# Patient Record
Sex: Male | Born: 1975 | Race: Black or African American | Hispanic: No | Marital: Single | Smoking: Former smoker
Health system: Southern US, Community
[De-identification: ages and names within clinical notes are randomized; demographics above are authoritative.]

## PROBLEM LIST (undated history)

## (undated) ENCOUNTER — Emergency Department (HOSPITAL_COMMUNITY): Admission: EM | Payer: Medicaid Other | Source: Home / Self Care

## (undated) DIAGNOSIS — I499 Cardiac arrhythmia, unspecified: Secondary | ICD-10-CM

## (undated) DIAGNOSIS — I509 Heart failure, unspecified: Secondary | ICD-10-CM

## (undated) DIAGNOSIS — I4891 Unspecified atrial fibrillation: Secondary | ICD-10-CM

## (undated) DIAGNOSIS — R011 Cardiac murmur, unspecified: Secondary | ICD-10-CM

## (undated) DIAGNOSIS — F17200 Nicotine dependence, unspecified, uncomplicated: Secondary | ICD-10-CM

## (undated) DIAGNOSIS — Z87891 Personal history of nicotine dependence: Secondary | ICD-10-CM

## (undated) DIAGNOSIS — I10 Essential (primary) hypertension: Secondary | ICD-10-CM

## (undated) DIAGNOSIS — R58 Hemorrhage, not elsewhere classified: Secondary | ICD-10-CM

## (undated) HISTORY — DX: Essential (primary) hypertension: I10

## (undated) HISTORY — PX: CARDIAC SURGERY: SHX584

## (undated) HISTORY — PX: HERNIA REPAIR: SHX51

## (undated) HISTORY — DX: Heart failure, unspecified: I50.9

## (undated) HISTORY — PX: OTHER SURGICAL HISTORY: SHX169

## (undated) HISTORY — PX: ABDOMINAL SURGERY: SHX537

---

## 2004-04-05 ENCOUNTER — Emergency Department (HOSPITAL_COMMUNITY): Admission: EM | Admit: 2004-04-05 | Discharge: 2004-04-05 | Payer: Self-pay | Admitting: Family Medicine

## 2004-09-12 ENCOUNTER — Emergency Department (HOSPITAL_COMMUNITY): Admission: EM | Admit: 2004-09-12 | Discharge: 2004-09-12 | Payer: Self-pay | Admitting: Emergency Medicine

## 2004-09-13 ENCOUNTER — Inpatient Hospital Stay (HOSPITAL_COMMUNITY): Admission: EM | Admit: 2004-09-13 | Discharge: 2004-09-15 | Payer: Self-pay | Admitting: Emergency Medicine

## 2004-09-13 ENCOUNTER — Ambulatory Visit: Payer: Self-pay | Admitting: Gastroenterology

## 2004-09-28 ENCOUNTER — Emergency Department (HOSPITAL_COMMUNITY): Admission: EM | Admit: 2004-09-28 | Discharge: 2004-09-28 | Payer: Self-pay | Admitting: Family Medicine

## 2004-12-31 ENCOUNTER — Emergency Department (HOSPITAL_COMMUNITY): Admission: EM | Admit: 2004-12-31 | Discharge: 2004-12-31 | Payer: Self-pay | Admitting: Emergency Medicine

## 2007-12-04 ENCOUNTER — Emergency Department (HOSPITAL_COMMUNITY): Admission: EM | Admit: 2007-12-04 | Discharge: 2007-12-05 | Payer: Self-pay | Admitting: Emergency Medicine

## 2008-01-05 ENCOUNTER — Emergency Department (HOSPITAL_COMMUNITY): Admission: EM | Admit: 2008-01-05 | Discharge: 2008-01-05 | Payer: Self-pay | Admitting: Emergency Medicine

## 2008-08-22 ENCOUNTER — Emergency Department (HOSPITAL_COMMUNITY): Admission: EM | Admit: 2008-08-22 | Discharge: 2008-08-22 | Payer: Self-pay | Admitting: Family Medicine

## 2009-06-20 ENCOUNTER — Emergency Department (HOSPITAL_COMMUNITY): Admission: EM | Admit: 2009-06-20 | Discharge: 2009-06-20 | Payer: Self-pay | Admitting: Emergency Medicine

## 2009-07-28 ENCOUNTER — Ambulatory Visit (HOSPITAL_BASED_OUTPATIENT_CLINIC_OR_DEPARTMENT_OTHER): Admission: RE | Admit: 2009-07-28 | Discharge: 2009-07-28 | Payer: Self-pay | Admitting: General Surgery

## 2009-08-09 ENCOUNTER — Inpatient Hospital Stay (HOSPITAL_COMMUNITY): Admission: EM | Admit: 2009-08-09 | Discharge: 2009-08-12 | Payer: Self-pay | Admitting: Emergency Medicine

## 2009-08-09 ENCOUNTER — Ambulatory Visit: Payer: Self-pay | Admitting: Gastroenterology

## 2009-08-09 ENCOUNTER — Ambulatory Visit: Payer: Self-pay | Admitting: Infectious Diseases

## 2009-08-10 ENCOUNTER — Encounter: Payer: Self-pay | Admitting: Internal Medicine

## 2009-08-10 ENCOUNTER — Encounter: Payer: Self-pay | Admitting: Infectious Diseases

## 2009-08-11 ENCOUNTER — Encounter: Payer: Self-pay | Admitting: Internal Medicine

## 2009-08-12 ENCOUNTER — Encounter (INDEPENDENT_AMBULATORY_CARE_PROVIDER_SITE_OTHER): Payer: Self-pay | Admitting: *Deleted

## 2009-08-13 ENCOUNTER — Telehealth: Payer: Self-pay | Admitting: Gastroenterology

## 2009-08-21 ENCOUNTER — Encounter (INDEPENDENT_AMBULATORY_CARE_PROVIDER_SITE_OTHER): Payer: Self-pay | Admitting: *Deleted

## 2009-08-25 ENCOUNTER — Ambulatory Visit: Payer: Self-pay | Admitting: Gastroenterology

## 2009-09-02 ENCOUNTER — Telehealth: Payer: Self-pay | Admitting: Gastroenterology

## 2009-09-28 ENCOUNTER — Telehealth: Payer: Self-pay | Admitting: Internal Medicine

## 2009-10-06 ENCOUNTER — Encounter (INDEPENDENT_AMBULATORY_CARE_PROVIDER_SITE_OTHER): Payer: Self-pay | Admitting: *Deleted

## 2009-10-06 ENCOUNTER — Ambulatory Visit: Payer: Self-pay | Admitting: Gastroenterology

## 2009-10-07 ENCOUNTER — Telehealth: Payer: Self-pay | Admitting: Gastroenterology

## 2010-01-27 ENCOUNTER — Emergency Department (HOSPITAL_COMMUNITY): Admission: EM | Admit: 2010-01-27 | Discharge: 2010-01-27 | Payer: Self-pay | Admitting: Emergency Medicine

## 2010-08-11 NOTE — Progress Notes (Signed)
Summary: medicaid will not pay for meds  Phone Note Call from Patient Call back at Home Phone [phone]   Call For: Dr Russella Dar Reason for Call: Talk to Nurse Summary of Call: His medicaid refused to pay for the medication Dr ordered. Wha else can he take? Initial call taken by: Leanor Kail [facility],  August 13, 2009 4:03 PM  Follow-up for Phone Call        Pt states the medication that was sent was pantoprazole sodium and that is not covered by his insurance  company. I told pt that he has to call outpatient clinic b/c they are the ones who prescribed the medication for him. According the discharge summary he is to follow up with [place] and them and the appts have been made. Follow-up by: Christie Nottingham CMA (AAMA),  August 14, 2009 8:20 AM

## 2010-08-11 NOTE — Progress Notes (Signed)
Summary: cx fee?  Phone Note Call from Patient   Caller: wife, Glee Arvin Call For: Dr. Russella Dar Reason for Call: Talk to Doctor Summary of Call: pt's wife called to cancel pt's 09/04/2009 COL and rescheduled for 10/06/2009... reason: pt having emergency oral surgery Dr. Russella Dar, do you want to charge pt $100 cx fee? Initial call taken by: Vallarie Mare,  September 02, 2009 10:02 AM  Follow-up for Phone Call        No charge. Follow-up by: Meryl Dare MD Clementeen Graham,  September 02, 2009 10:18 AM  Additional Follow-up for Phone Call Additional follow up Details #1::        Patient NOT BILLED. Additional Follow-up by: Leanor Kail [facility],  September 08, 2009 9:12 AM

## 2010-08-11 NOTE — Procedures (Signed)
Summary: Upper Endoscopy  Patient: Gary [name] Note: All result statuses are Final unless otherwise noted.  Tests: (1) Upper Endoscopy (EGD)   EGD Upper Endoscopy       DONE     [facility]     [address]     [place], [place]  [address]           ENDOSCOPY PROCEDURE REPORT           PATIENT:  [name], [name]  MR#:  [id number]     BIRTHDATE:  09-21-1975, 33 yrs. old  GENDER:  male           ENDOSCOPIST:  Hedwig Morton. Juanda Chance, MD     Referred by:  Consuello Bossier, M.D.           PROCEDURE DATE:  08/10/2009     PROCEDURE:  enteroscopy 30865     ASA CLASS:  Class II     INDICATIONS:  iron deficiency anemia Hgb 7.5, took Ibuprofen x6     after inguinal hernia repair,     remote EGD 2006 for UGIB was normal     hx of "stomach" surgery           MEDICATIONS:   Versed 7 mg, Fentanyl 75 mcg     TOPICAL ANESTHETIC:  Exactacain Spray           DESCRIPTION OF PROCEDURE:   After the risks benefits and     alternatives of the procedure were thoroughly explained, informed     consent was obtained.  The EC-[id number] ([id number]) endoscope was     introduced through the mouth and advanced to the proximal jejunum,     without limitations.  The instrument was slowly withdrawn as the     mucosa was fully examined.     <<PROCEDUREIMAGES>>           Post-operative change was noted in the antrum. ss/p     gastroenetostomy, both limbs entered x 30 cm and appear normal, no     stricture or sign of bleeding With standard forceps, a biopsy was     obtained and sent to pathology (see image002, image003, image009,     image010, and image011). s/p jejunal biopsy  The stomach was     entered and closely examined. The antrum, angularis, and lesser     curvature were well visualized, including a retroflexed view of     the cardia and fundus. The stomach wall was normally distensable.     The scope passed easily through the pylorus into the duodenum (see     image012, image011, image008,  image004, and image001). normal     gastric musoca, no gastritis  The esophagus and gastroesophageal     junction were completely normal in appearance (see image005).     Retroflexed views revealed no abnormalities.    The scope was then     withdrawn from the patient and the procedure completed.           COMPLICATIONS:  None           ENDOSCOPIC IMPRESSION:     1) Post-operative change in the antrum     2) Normal stomach     3) Normal esophagus     s/p remote gastrojejunostomy, nothing to account for melena     I performed a rectal exam again and confirm presence of melena     RECOMMENDATIONS:     1) await biopsy results  small bowl capsule endoscopy     avoid NSAIDs, if SBCE negative then colonoscopy           REPEAT EXAM:  In 0 year(s) for.           ______________________________     Hedwig Morton. Juanda Chance, MD           CC:           n.     eSIGNED:   Hedwig Morton. Asharia Lotter at 08/10/2009 11:34 AM           Page 2 of 3   Yovanny, Coats, [id number]  Note: An exclamation Demarie (!) indicates a result that was not dispersed into the flowsheet. Document Creation Date: 08/10/2009 11:34 AM _______________________________________________________________________  (1) Order result status: Final Collection or observation date-time: 08/10/2009 11:21 Requested date-time:  Receipt date-time:  Reported date-time:  Referring Physician:   Ordering Physician: Lina Sar [phone]) Specimen Source:  Source: Launa Grill Order Number: [id number] Lab site:

## 2010-08-11 NOTE — Letter (Signed)
Summary: New Patient letter  [facility]  [address] [place], [place] [address]   Phone: [phone]  Fax: [phone]       08/12/2009 MRN: [id number]  [facility] [address] [place], [place]  [address]  Dear Mr. Trudee Grip,  Welcome to the Gastroenterology Division at [facility].    You are scheduled to see Dr.  Russella Dar on 09-04-09 at 3:30p.m. on the 3rd floor at [facility], [address]  We ask that you try to arrive at our office 15 minutes prior to your appointment time to allow for check-in.  We would like you to complete the enclosed self-administered evaluation form prior to your visit and bring it with you on the day of your appointment.  We will review it with you.  Also, please bring a complete list of all your medications or, if you prefer, bring the medication bottles and we will list them.  Please bring your insurance card so that we may make a copy of it.  If your insurance requires a referral to see a specialist, please bring your referral form from your primary care physician.  Co-payments are due at the time of your visit and may be paid by cash, check or credit card.     Your office visit will consist of a consult with your physician (includes a physical exam), any laboratory testing he/she may order, scheduling of any necessary diagnostic testing (e.g. x-ray, ultrasound, CT-scan), and scheduling of a procedure (e.g. Endoscopy, Colonoscopy) if required.  Please allow enough time on your schedule to allow for any/all of these possibilities.    If you cannot keep your appointment, please call [phone] to cancel or reschedule prior to your appointment date.  This allows [place] the opportunity to schedule an appointment for another patient in need of care.  If you do not cancel or reschedule by 5 p.m. the business day prior to your appointment date, you will be charged a $50.00 late cancellation/no-show fee.    Thank you for choosing   Gastroenterology for your medical needs.  We appreciate the opportunity to care for you.  Please visit [place] at our website  to learn more about our practice.                     Sincerely,                                                             The Gastroenterology Division

## 2010-08-11 NOTE — Miscellaneous (Signed)
Summary: hosptial discharge  Hospital Discharge  Date of admission:08/09/09  Date of discharge:08/11/09  Brief reason for admission/active problems: 1.GI bleed. Patient presented with melena, FOBT positive, unintentional weight loss of 7 lbs x 2 weeks; and Hbg of 7.6 at the time of admission. EGD and/or colonsocopy performed by Dr. Russella Dar. Please, refer to a dictated report.  Followup needed:repeat FOBT test to reasses for Gi bleed  The medication and problem lists have been updated.  Please see the dictated discharge summary for details.  Medications: Added new medication of PANTOPRAZOLE SODIUM 40 MG TBEC (PANTOPRAZOLE SODIUM) Take 1 tablet by mouth two times a day - Signed Rx of PANTOPRAZOLE SODIUM 40 MG TBEC (PANTOPRAZOLE SODIUM) Take 1 tablet by mouth two times a day;  #60 x 2;  Signed;  Entered by: Deatra Robinson MD;  Authorized by: Deatra Robinson MD;  Method used: Print then Give to Patient Rx of PANTOPRAZOLE SODIUM 40 MG TBEC (PANTOPRAZOLE SODIUM) Take 1 tablet by mouth two times a day;  #60 x 6;  Signed;  Entered by: Darnelle Maffucci MD;  Authorized by: Deatra Robinson MD;  Method used: Print then Give to Patient Observations: Added new observation of MEDRECON: current updated (08/10/2009 18:07) Added new observation of INSTRUCTIONS: Please come to an appointment at the outpatient clinic at [facility] on 2/10 at 2:00 pm with Dr.Magick  for a followup visit.  Please take your medication as prescribed below. (08/10/2009 18:07)    Prescriptions: PANTOPRAZOLE SODIUM 40 MG TBEC (PANTOPRAZOLE SODIUM) Take 1 tablet by mouth two times a day  #60 x 6   Entered by:   Darnelle Maffucci MD   Authorized by:   Deatra Robinson MD   Signed by:   Darnelle Maffucci MD on 08/11/2009   Method used:   Print then Give to Patient   RxID:   [id number] PANTOPRAZOLE SODIUM 40 MG TBEC (PANTOPRAZOLE SODIUM) Take 1 tablet by mouth two times a day  #60 x 2   Entered and Authorized by:   Deatra Robinson MD   Signed by:   Deatra Robinson MD on 08/10/2009   Method used:   Print then Give to Patient   RxID:   [id number]    Patient Instructions: 1)  Please come to an appointment at the outpatient clinic at [facility] on 2/10 at 2:00 pm with Dr.Magick  for a followup visit. 2)  Please take your medication as prescribed below.  Current Medications (verified): 1)  Pantoprazole Sodium 40 Mg Tbec (Pantoprazole Sodium) .... Take 1 Tablet By Mouth Two Times A Day

## 2010-08-11 NOTE — Letter (Signed)
Summary: [facility] No Show Letter  [facility]  [address] [place], [place] [address]   Phone: [phone]  Fax: [phone]      October 06, 2009 MRN: [id number]   [facility] [address] [place], [place]  [address]      You were scheduled for an endoscopic procedure with Dr. Russella Dar  on 10-06-09 at the [facility] but you did not keep the appointment.    Your provider recommended this procedure for the benefit of your health.  It is very important that you reschedule it.  Failure to do so may be to the detriment of your health.  Please call us at [phone] and we will be happy to assist you with rescheduling.    If you were referred for this procedure by another physician/provider, we will notify him/her that you did not keep your appointment.   Sincerely,  Greer Ee R.N.   [facility]  Appended Document: [facility] No Show Letter Attempted to call patient 4 times unable to reach patient at various numbers on demographic sheet.

## 2010-08-11 NOTE — Progress Notes (Signed)
Summary: NO SHOW for Colon procedure 10-06-09  Phone Note Outgoing Call   Summary of Call: Dr Russella Dar, Patient NO SHOWED for Colon yesterday 10-06-09. Do you want to charge the CX Fee? Initial call taken by: Leanor Kail [facility],  October 07, 2009 3:15 PM  Follow-up for Phone Call        Yes Follow-up by: Meryl Dare MD FACG,  October 07, 2009 3:22 PM  Additional Follow-up for Phone Call Additional follow up Details #1::        Patient BILLED. Additional Follow-up by: Leanor Kail [facility],  October 07, 2009 3:30 PM

## 2010-08-11 NOTE — Letter (Signed)
Summary: [facility] Instructions  [facility]  [address] [place], [place] [address]   Phone: [phone]  Fax: [phone]       Gary [name]    [birth year]    MRN: [id number]        Procedure Day /Date:  Friday 09/04/2009     Arrival Time: 8:00 am      Procedure Time: 9:00 am     Location of Procedure:                    _x _  [facility] (4th Floor)                        PREPARATION FOR COLONOSCOPY WITH MOVIPREP   Starting 5 days prior to your procedure Sunday 08/30/2009  do not eat nuts, seeds, popcorn, corn, beans, peas,  salads, or any raw vegetables.  Do not take any fiber supplements (e.g. Metamucil, Citrucel, and Benefiber).  THE DAY BEFORE YOUR PROCEDURE         DATE: Thursday 2/24  1.  Drink clear liquids the entire day-NO SOLID FOOD  2.  Do not drink anything colored red or purple.  Avoid juices with pulp.  No orange juice.  3.  Drink at least 64 oz. (8 glasses) of fluid/clear liquids during the day to prevent dehydration and help the prep work efficiently.  CLEAR LIQUIDS INCLUDE: Water Jello Ice Popsicles Tea (sugar ok, no milk/cream) Powdered fruit flavored drinks Coffee (sugar ok, no milk/cream) Gatorade Juice: apple, white grape, white cranberry  Lemonade Clear bullion, consomm, broth Carbonated beverages (any kind) Strained chicken noodle soup Hard Candy                             4.  In the morning, mix first dose of MoviPrep solution:    Empty 1 Pouch A and 1 Pouch B into the disposable container    Add lukewarm drinking water to the top line of the container. Mix to dissolve    Refrigerate (mixed solution should be used within 24 hrs)  5.  Begin drinking the prep at 5:00 p.m. The MoviPrep container is divided by 4 marks.   Every 15 minutes drink the solution down to the next [name] (approximately 8 oz) until the full liter is complete.   6.  Follow completed prep with 16 oz of clear liquid of your choice  (Nothing red or purple).  Continue to drink clear liquids until bedtime.  7.  Before going to bed, mix second dose of MoviPrep solution:    Empty 1 Pouch A and 1 Pouch B into the disposable container    Add lukewarm drinking water to the top line of the container. Mix to dissolve    Refrigerate  THE DAY OF YOUR PROCEDURE      DATE: Friday 2/25  Beginning at4:00 am (5 hours before procedure):         1. Every 15 minutes, drink the solution down to the next [name] (approx 8 oz) until the full liter is complete.  2. Follow completed prep with 16 oz. of clear liquid of your choice.    3. You may drink clear liquids until 7:00 am  (2 HOURS BEFORE PROCEDURE).   MEDICATION INSTRUCTIONS  Unless otherwise instructed, you should take regular prescription medications with a small sip of water   as early as possible the morning  of your procedure.        OTHER INSTRUCTIONS  You will need a responsible adult at least 36 years of age to accompany you and drive you home.   This person must remain in the waiting room during your procedure.  Wear loose fitting clothing that is easily removed.  Leave jewelry and other valuables at home.  However, you may wish to bring a book to read or  an iPod/MP3 player to listen to music as you wait for your procedure to start.  Remove all body piercing jewelry and leave at home.  Total time from sign-in until discharge is approximately 2-3 hours.  You should go home directly after your procedure and rest.  You can resume normal activities the  day after your procedure.  The day of your procedure you should not:   Drive   Make legal decisions   Operate machinery   Drink alcohol   Return to work  You will receive specific instructions about eating, activities and medications before you leave.    The above instructions have been reviewed and explained to me by  [name] RN  August 25, 2009 2:32 PM    I fully understand and can  verbalize these instructions _____________________________ Date _________

## 2010-08-11 NOTE — Procedures (Signed)
Summary: Capsule Endoscopy   Capsule Endoscopy  Procedure date:  08/11/2009  Findings:      Performing Location: [facility]   Ordering Physician: Lina Sar, MD  Report created/read by: Claudette Head, MD  Reason for Referral:  35 y/o with recent hospitlization for anemia, melena.  He is s/p remote gastrojejunostomy.  EGD 08/10/09 Negative  Procedure Information and Findings:  1) incomplete study with capsule retention in the esophagus for 4 hours, and very poor prep with food/debri in stomach and only a small portion of the small bowel visulalized.    Summary and Recommendations:  Proceed with colonoscopy next.  If capsule repeated will need better prep and perhaps endoscopic placement into limb of gastrojejunostomy.  This report was created from the original report, which was reviewed and signed by the above listed reading physician.   Appended Document: Capsule Endoscopy reviewed results with patient and he is scheduled for pre-visit for 08-24-09 2:00, colon [facility] 09-04-09 9:00

## 2010-08-11 NOTE — Progress Notes (Signed)
Summary: Prior Approval for Pantoprazole   Phone Note Outgoing Call   Call placed by: Angelina Ok RN,  September 28, 2009 10:42 AM Call placed to: Insurer Summary of Call: Prior Approval for Pantoprazole done.  Approved 09/28/2009 thru 09/28/2010. Angelina Ok RN  September 28, 2009 10:43 AM  Initial call taken by: Angelina Ok RN,  September 28, 2009 10:43 AM    New/Updated Medications: PANTOPRAZOLE SODIUM 40 MG TBEC (PANTOPRAZOLE SODIUM) Take 1 tablet by mouth two times a day

## 2010-08-11 NOTE — Miscellaneous (Signed)
Summary: [facility] PV  Clinical Lists Changes  Medications: Added new medication of MOVIPREP 100 GM  SOLR (PEG-KCL-NACL-NASULF-NA ASC-C) As per prep instructions. - Signed Rx of MOVIPREP 100 GM  SOLR (PEG-KCL-NACL-NASULF-NA ASC-C) As per prep instructions.;  #1 x 0;  Signed;  Entered by: [name] RN;  Authorized by: Meryl Dare MD Clementeen Graham;  Method used: Electronically to [facility] [id number], [place], [place], [place]  [address], Ph: [phone], Fax: [phone] Observations: Added new observation of NKA: T (08/25/2009 13:49)    Prescriptions: MOVIPREP 100 GM  SOLR (PEG-KCL-NACL-NASULF-NA ASC-C) As per prep instructions.  #1 x 0   Entered by:   [name] RN   Authorized by:   Meryl Dare MD [facility]   Signed by:   [name] RN on 08/25/2009   Method used:   Electronically to        [facility] [id number] (retail)       [address] [place], [place]  [address]       Ph: [phone]       Fax: [phone]   RxID:   [id number]

## 2010-09-26 LAB — COMPREHENSIVE METABOLIC PANEL
ALT: 13 U/L (ref 0–53)
AST: 25 U/L (ref 0–37)
Albumin: 3.4 g/dL — ABNORMAL LOW (ref 3.5–5.2)
Alkaline Phosphatase: 64 U/L (ref 39–117)
Calcium: 8.3 mg/dL — ABNORMAL LOW (ref 8.4–10.5)
Chloride: 105 mEq/L (ref 96–112)
Creatinine, Ser: 0.66 mg/dL (ref 0.4–1.5)
GFR calc Af Amer: >60 mL/min (ref >60)
GFR calc non Af Amer: >60 mL/min (ref >60)
Glucose, Bld: 83 mg/dL (ref 70–99)
Potassium: 3.8 mEq/L (ref 3.5–5.1)
Total Bilirubin: 1 mg/dL (ref 0.3–1.2)
Total Protein: 5.5 g/dL — ABNORMAL LOW (ref 6.0–8.3)

## 2010-09-26 LAB — CARDIAC PANEL(CRET KIN+CKTOT+MB+TROPI)
CK, MB: 1.4 ng/mL (ref 0.3–4.0)
Relative Index: 0.7 (ref 0.0–2.5)
Total CK: 309 U/L — ABNORMAL HIGH (ref 7–232)
Total CK: 425 U/L — ABNORMAL HIGH (ref 7–232)
Troponin I: 0.02 ng/mL (ref 0.00–0.06)

## 2010-09-26 LAB — CROSSMATCH
ABO/RH(D): O POS
Antibody Screen: NEGATIVE

## 2010-09-26 LAB — HEPATIC FUNCTION PANEL
ALT: 12 U/L (ref 0–53)
AST: 22 U/L (ref 0–37)
Albumin: 3.1 g/dL — ABNORMAL LOW (ref 3.5–5.2)
Bilirubin, Direct: 0.2 mg/dL (ref 0.0–0.3)
Total Protein: 5.1 g/dL — ABNORMAL LOW (ref 6.0–8.3)

## 2010-09-26 LAB — IRON AND TIBC
Saturation Ratios: 17 % — ABNORMAL LOW (ref 20–55)
TIBC: 185 ug/dL — ABNORMAL LOW (ref 215–435)

## 2010-09-26 LAB — APTT: aPTT: 37 seconds (ref 24–37)

## 2010-09-26 LAB — HEMOGLOBIN AND HEMATOCRIT, BLOOD
HCT: 23.9 % — ABNORMAL LOW (ref 39.0–52.0)
HCT: 25.8 % — ABNORMAL LOW (ref 39.0–52.0)
HCT: 27 % — ABNORMAL LOW (ref 39.0–52.0)
Hemoglobin: 7.9 g/dL — ABNORMAL LOW (ref 13.0–17.0)
Hemoglobin: 9.1 g/dL — ABNORMAL LOW (ref 13.0–17.0)

## 2010-09-26 LAB — RETICULOCYTES: RBC.: 2.37 MIL/uL — ABNORMAL LOW (ref 4.22–5.81)

## 2010-09-26 LAB — FERRITIN: Ferritin: 228 ng/mL (ref 22–322)

## 2010-09-26 LAB — BASIC METABOLIC PANEL
CO2: 24 mEq/L (ref 19–32)
Calcium: 7.9 mg/dL — ABNORMAL LOW (ref 8.4–10.5)
Creatinine, Ser: 0.7 mg/dL (ref 0.4–1.5)
GFR calc Af Amer: >60 mL/min (ref >60)

## 2010-09-26 LAB — CBC
Platelets: 118 10*3/uL — ABNORMAL LOW (ref 150–400)
RBC: 2.3 MIL/uL — ABNORMAL LOW (ref 4.22–5.81)

## 2010-09-26 LAB — DIFFERENTIAL: Basophils Absolute: 0 10*3/uL (ref 0.0–0.1)

## 2010-09-26 LAB — PROTIME-INR: INR: 1.17 (ref 0.00–1.49)

## 2010-09-26 LAB — ABO/RH: ABO/RH(D): O POS

## 2010-09-27 LAB — COMPREHENSIVE METABOLIC PANEL
ALT: 15 U/L (ref 0–53)
AST: 17 U/L (ref 0–37)
Albumin: 4 g/dL (ref 3.5–5.2)
CO2: 29 mEq/L (ref 19–32)
Chloride: 107 mEq/L (ref 96–112)
GFR calc Af Amer: >60 mL/min (ref >60)
GFR calc non Af Amer: >60 mL/min (ref >60)
Potassium: 4 mEq/L (ref 3.5–5.1)
Sodium: 141 mEq/L (ref 135–145)
Total Bilirubin: 1.3 mg/dL — ABNORMAL HIGH (ref 0.3–1.2)

## 2010-09-27 LAB — DIFFERENTIAL
Basophils Absolute: 0 10*3/uL (ref 0.0–0.1)
Eosinophils Absolute: 0.1 10*3/uL (ref 0.0–0.7)
Eosinophils Relative: 2 % (ref 0–5)
Monocytes Absolute: 0.6 10*3/uL (ref 0.1–1.0)

## 2010-09-27 LAB — CBC
MCV: 93.6 fL (ref 78.0–100.0)
RBC: 4.55 MIL/uL (ref 4.22–5.81)
WBC: 6.6 10*3/uL (ref 4.0–10.5)

## 2010-09-29 LAB — BASIC METABOLIC PANEL
BUN: 3 mg/dL — ABNORMAL LOW (ref 6–23)
BUN: 6 mg/dL (ref 6–23)
CO2: 27 mEq/L (ref 19–32)
Calcium: 8.5 mg/dL (ref 8.4–10.5)
Calcium: 8.5 mg/dL (ref 8.4–10.5)
Creatinine, Ser: 0.74 mg/dL (ref 0.4–1.5)
GFR calc Af Amer: >60 mL/min (ref >60)
GFR calc non Af Amer: >60 mL/min (ref >60)
Glucose, Bld: 112 mg/dL — ABNORMAL HIGH (ref 70–99)
Potassium: 4 mEq/L (ref 3.5–5.1)

## 2010-09-29 LAB — CBC
HCT: 25.2 % — ABNORMAL LOW (ref 39.0–52.0)
MCHC: 33.9 g/dL (ref 30.0–36.0)
MCV: 94.9 fL (ref 78.0–100.0)
Platelets: 138 10*3/uL — ABNORMAL LOW (ref 150–400)
Platelets: 139 10*3/uL — ABNORMAL LOW (ref 150–400)
RBC: 2.73 MIL/uL — ABNORMAL LOW (ref 4.22–5.81)
RDW: 14.5 % (ref 11.5–15.5)
WBC: 7.4 10*3/uL (ref 4.0–10.5)
WBC: 9.9 10*3/uL (ref 4.0–10.5)

## 2010-09-29 LAB — HEMOGLOBIN AND HEMATOCRIT, BLOOD
HCT: 26.5 % — ABNORMAL LOW (ref 39.0–52.0)
Hemoglobin: 9.1 g/dL — ABNORMAL LOW (ref 13.0–17.0)

## 2010-11-26 NOTE — Discharge Summary (Signed)
NAMEANTOINNE, Gary [name]               ACCOUNT NO.:  [id number]   MEDICAL RECORD NO.:  [id number]          PATIENT TYPE:  INP   LOCATION:  6736                         FACILITY:  [facility]   PHYSICIAN:  Corinna L. Lendell [name], MDDATE OF BIRTH:  03/02/1976   DATE OF ADMISSION:  09/13/2004  DATE OF DISCHARGE:  09/15/2004                                 DISCHARGE SUMMARY   DIAGNOSES:  1.  Gastrointestinal bleed, most likely upper.  2.  Acute blood loss anemia.  3.  Bronchospasm.  4.  Tenia corporis.  5.  Tooth decay.   FOLLOW-UP:  With primary care physician in about a month.  Follow up with  dentist for extraction   DISCHARGE MEDICATIONS:  1.  Albuterol MDI two puffs q.4h. p.r.n. wheezing or shortness of breath.  2.  Protonix 40 mg p.o. daily for a month.  3.  Ketoconazole cream to the rash on his shoulders twice a day for two      weeks.   ACTIVITY:  Light duty for two weeks and then regular duty.   PAIN MANAGEMENT:  Tylenol as needed. No aspirin, ibuprofen, or Naprosyn.   CONDITION:  Stable.   CONSULTATIONS:  Dr. Russella Dar.   PROCEDURES:  EGD which showed no blood and no source of bleeding.   PERTINENT LABORATORY DATA:  UA revealed negative nitrites, few epithelial  cells, moderate leukocyte esterase, negative protein, 15 ketones, negative  blood. Urine drug screen was positive for cannabinoids. Complete metabolic  panel was fairly unremarkable. INR was 1.2, PTT 40. CBC on admission was  significant for hemoglobin of 8.5, hematocrit 23.9, platelet count was 149,  white blood cell count 11.5. His hemoglobin dropped to 7.5 and after  transfusion rose to almost 10 and remained stable.   SPECIAL STUDIES AND RADIOLOGY:  EKG showed normal sinus rhythm, LVH by  voltage with repolarization abnormality.   HISTORY AND HOSPITAL COURSE:  Mr. Kitner is a pleasant 35 year old black  male who has been having tooth pain and has been taking a lot of ibuprofen.  He presented to the emergency room  twice with hematemesis. Initially he was  evaluated and sent home with a prescription. Please see H&P for further  details. He was admitted, given proton-pump inhibitor, IV fluid,  antiemetics, serial hemoglobins and hematocrits. He required transfusion. He  had also had some melena. He had no further hematemesis or melena during his  hospitalization and EGD was  normal. It is possible that he had a healed Mallory-Weiss tear or an ulcer  in the jejunum that was not completely visualized. His vital signs were  stable on admission and remained so as did his examination. He was  tolerating a regular diet at the time of discharge.      CLS/MEDQ  D:  09/15/2004  T:  09/15/2004  Job:  [id number]

## 2010-11-26 NOTE — H&P (Signed)
NAMEMarland [name]  ELMUS, MATHES NO.:  [id number]   MEDICAL RECORD NO.:  [id number]          PATIENT TYPE:  EMS   LOCATION:  MAJO                         FACILITY:  [facility]   PHYSICIAN:  Deirdre Peer. Polite, M.D. DATE OF BIRTH:  11/21/1975   DATE OF ADMISSION:  09/13/2004  DATE OF DISCHARGE:                                HISTORY & PHYSICAL   CHIEF COMPLAINT:  Black stools and black emesis.   HISTORY OF PRESENT ILLNESS:  Mr. Googe is a 35 year old male who was seen  in the ED x 2 for the above chief complaint.  The patient stated that he was  in fair health up until yesterday where he had coffee ground emesis x 2 and  was having black stools.  Also had associated weakness and malaise.  The  patient does admit to taking NSAIDs approximately three to four times a week  over a two-month period for tooth pain.  The patient recently saw a dentist  and was given 800 mg of Motrin for his pain.  The patient states that after  taking those Motrin tablets, he had significant pain, nausea and subsequent  coffee ground emesis and black stools.  The patient denies any history of  ulcer, however, does admit to having some problems in the distal past with  food poisoning where he stated he required an EGD.  He is unsure if ulcer or  gastritis was diagnosed at that time.  In the ED, the patient was evaluated  and found to be afebrile and hemodynamically stable.  However, he is heme  positive and the hemoglobin at this time is 8.5.  It was approximately 10  yesterday when he presented to the ED.  Admission is deemed necessary for  further evaluation and treatment.  On further questioning, the patient  stated that he has been suffering from an upper respiratory infection for  approximately two weeks with productive sputum and wheezing on the left side  of his chest.  He states that appetite has been okay and up until yesterday,  stools have been okay.  Denies any weight loss.  As stated,  admission is  deemed necessary for evaluation and treatment of upper GI bleed.   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  The patient also stated that he had  abdominal surgery as a child, organs were turned around.  The patient  denies hypertension and denies diabetes.   MEDICATIONS:  Motrin x 2 days, although he has been taking other NSAIDs,  Advil approximately three to four times a week for approximately two months.   SOCIAL HISTORY:  Positive for tobacco one pack per day.  Positive for  alcohol socially, none x 1 week.  Positive marijuana.   PAST SURGICAL HISTORY:  Significant for heart surgery as a child, hole in  heart.  Also abdominal surgery as a child, organs turned around.  The  patient denies appendectomy or cholecystectomy.   ALLERGIES:  No known drug allergies.   FAMILY HISTORY:  Mother with  some skin cancers and diabetes.  Father  unknown.  Brothers and sisters fairly healthy.   REVIEW OF SYSTEMS:  As stated in the HPI, otherwise negative.   PHYSICAL EXAMINATION:  GENERAL APPEARANCE:  The patient is in moderate  distress secondary to abdominal discomfort and feeling weak from his anemia.  HEENT:  Significant for muddy sclerae.  Moist oral mucosa.  NECK:  No nodes.  No JVD.  CHEST:  The patient has rhoncherous breath sounds almost the entire left  hemithorax.  CARDIOVASCULAR:  Regular.  The patient does have a systolic murmur, probable  flow murmur.  ABDOMEN:  Well-healed scar.  No hepatosplenomegaly.  Vague discomfort with  deep palpation.  EXTREMITIES:  No edema.  The patient does have pale nailbeds.  NEUROLOGIC:  Nonfocal exam.   LABORATORY DATA:  BMET:  Sodium 138, potassium 3.8, chloride 105, BUN 20,  creatinine 0.7.  CBC:  White count 11.5, hemoglobin 8.5, MCV 87.2, platelets  149, leukocyte count 77%.  INR 1.2.  Please note that the hemoglobin  yesterday when he presented to the ED  was approximately 10.   ASSESSMENT:  1.  Upper gastrointestinal bleed probably nonsteroidal anti-inflammatory      drug induced.  2.  Symptomatic anemia.  3.  Rule out left-sided pneumonia.  4.  Status post open heart surgery as a child.  5.  Status post abdominal surgery as a child.  6.  Tobacco abuse.  7.  Marijuana use.  8.  Tooth decay with extraction on an outpatient basis.   RECOMMENDATIONS:  Recommend that the patient be admitted to a telemetry  floor bed.  Will obtain coagulation times.  The patient will be resuscitated  with packed red blood cells.  Will start PPI b.i.d.  The patient will be  made NPO.  He is not to receive any NSAIDs.  Will obtain serial H&Hs and a  GI consult.  As for the patient's pulmonary problems, will obtain a chest x-  ray and start empiric antibiotics for possible aspiration pneumonia and  p.r.n. nebulizer treatments.  Will make further recommendations after review  of the above studies.      RDP/MEDQ  D:  09/13/2004  T:  09/13/2004  Job:  [id number]

## 2011-03-22 ENCOUNTER — Emergency Department (HOSPITAL_COMMUNITY)
Admission: EM | Admit: 2011-03-22 | Discharge: 2011-03-22 | Disposition: A | Discharge status: Home / Self Care | Payer: Medicaid Other | Attending: Emergency Medicine | Admitting: Emergency Medicine

## 2011-03-22 DIAGNOSIS — Z202 Contact with and (suspected) exposure to infections with a predominantly sexual mode of transmission: Secondary | ICD-10-CM | POA: Insufficient documentation

## 2011-03-23 LAB — GC/CHLAMYDIA PROBE AMP, GENITAL
Chlamydia, DNA Probe: NEGATIVE
GC Probe Amp, Genital: NEGATIVE

## 2014-02-09 ENCOUNTER — Emergency Department (HOSPITAL_COMMUNITY)
Admission: EM | Admit: 2014-02-09 | Discharge: 2014-02-09 | Disposition: A | Discharge status: Home / Self Care | Payer: Medicaid Other | Attending: Emergency Medicine | Admitting: Emergency Medicine

## 2014-02-09 ENCOUNTER — Encounter (HOSPITAL_COMMUNITY): Payer: Self-pay | Admitting: Emergency Medicine

## 2014-02-09 DIAGNOSIS — F17200 Nicotine dependence, unspecified, uncomplicated: Secondary | ICD-10-CM | POA: Diagnosis not present

## 2014-02-09 DIAGNOSIS — M79609 Pain in unspecified limb: Secondary | ICD-10-CM | POA: Diagnosis not present

## 2014-02-09 DIAGNOSIS — L84 Corns and callosities: Secondary | ICD-10-CM | POA: Diagnosis not present

## 2014-02-09 DIAGNOSIS — M79675 Pain in left toe(s): Secondary | ICD-10-CM

## 2014-02-09 DIAGNOSIS — Z9889 Other specified postprocedural states: Secondary | ICD-10-CM | POA: Insufficient documentation

## 2014-02-09 MED ORDER — HYDROCODONE-ACETAMINOPHEN 5-325 MG PO TABS
1.0000 | ORAL_TABLET | Freq: Once | ORAL | Status: AC
Start: 2014-02-09 — End: 2014-02-09
  Administered 2014-02-09: 1 via ORAL
  Filled 2014-02-09: qty 1

## 2014-02-09 MED ORDER — HYDROCODONE-ACETAMINOPHEN 5-325 MG PO TABS: ORAL_TABLET | ORAL | Status: DC

## 2014-02-09 NOTE — ED Provider Notes (Signed)
CSN: [id number]     Arrival date & time 02/09/14  1023 History  This chart was scribed for a non-physician practitioner, Wynetta EmeryNicole Robina Hamor, working with Doug SouSam Jacubowitz, MD by [place]Jordan Peace, ED Scribe. The patient was seen in TR07C/TR07C. The patient's care was started at 11:31 AM.     Chief Complaint  Patient presents with  . Toe Pain      Patient is a 38 y.o. male presenting with toe pain. The history is provided by the patient. No language interpreter was used.  Toe Pain   HPI Comments: Gary [name] is a 38 y.o. male who presents to the Emergency Department complaining of toe pain onset last night. Pt reports history of open heart surgery and abdominal surgery. Pt states that he has not been putting any medication on it. He further states that his podiatrist told him that it was a callus and was advised to put callus removal medication that has not worked. He denies any fever or chills. He states that he took 2 Advil's before going to sleep last night with slight relief. Pt reports that pain is exacerbated with ambulation and would like to receive a cane to help take pain off of toe when ambulating.    History reviewed. No pertinent past medical history. Past Surgical History  Procedure Laterality Date  . Hernia repair    . Cardiac surgery    . Abdominal surgery     No family history on file. History  Substance Use Topics  . Smoking status: Current Every Day Smoker -- 0.50 packs/day  . Smokeless tobacco: Not on file  . Alcohol Use: Yes     Comment: social    Review of Systems A complete 10 system review of systems was obtained and all systems are negative except as noted in the HPI and PMH.     Allergies  Review of patient's allergies indicates no known allergies.  Home Medications   Prior to Admission medications   Medication Sig Start Date End Date Taking? Authorizing Provider  HYDROcodone-acetaminophen (NORCO/VICODIN) 5-325 MG per tablet Take 1-2 tablets by mouth  every 6 hours as needed for pain. 02/09/14   Mccrae Speciale, PA-C   BP 113/50  Pulse 62  Temp(Src) 98 F (36.7 C) (Oral)  Resp 14  Ht 5\' 5"  (1.651 m)  Wt 125 lb (56.7 kg)  BMI 20.80 kg/m2  SpO2 100% Physical Exam  Nursing note and vitals reviewed. Constitutional: He is oriented to person, place, and time. He appears well-developed and well-nourished. No distress.  HENT:  Head: Normocephalic.  Eyes: Conjunctivae and EOM are normal.  Cardiovascular: Normal rate.   Pulmonary/Chest: Effort normal. No stridor.  Musculoskeletal: Normal range of motion.  Neurological: He is alert and oriented to person, place, and time.  Skin:  Callus tender ulceration to the medial side of left great toe, no warmth, erythema, discharge  Psychiatric: He has a normal mood and affect.    ED Course  Procedures (including critical care time)  Results for orders placed during the hospital encounter of 03/22/11  GC/CHLAMYDIA PROBE AMP, GENITAL      Result Value Ref Range   GC Probe Amp, Genital NEGATIVE  NEGATIVE   Chlamydia, DNA Probe NEGATIVE  NEGATIVE   No results found.    Labs Review Labs Reviewed - No data to display  Imaging Review No results found.   EKG Interpretation None     Medications  HYDROcodone-acetaminophen (NORCO/VICODIN) 5-325 MG per tablet 1 tablet (not administered)  11:40 AM- Treatment plan was discussed with patient who verbalizes understanding and agrees.    MDM   Final diagnoses:  Great toe pain, left    Filed Vitals:   02/09/14 1042  BP: 113/50  Pulse: 62  Temp: 98 F (36.7 C)  TempSrc: Oral  Resp: 14  Height: 5\' 5"  (1.651 m)  Weight: 125 lb (56.7 kg)  SpO2: 100%    Medications  HYDROcodone-acetaminophen (NORCO/VICODIN) 5-325 MG per tablet 1 tablet (not administered)    Gary [name] is a 38 y.o. male presenting with pain to callus of left great toe. No signs of infection. Patient will be given Vicodin and a cane. I advised him to follow  with podiatry.  Evaluation does not show pathology that would require ongoing emergent intervention or inpatient treatment. Pt is hemodynamically stable and mentating appropriately. Discussed findings and plan with patient/guardian, who agrees with care plan. All questions answered. Return precautions discussed and outpatient follow up given.   New Prescriptions   HYDROCODONE-ACETAMINOPHEN (NORCO/VICODIN) 5-325 MG PER TABLET    Take 1-2 tablets by mouth every 6 hours as needed for pain.       I personally performed the services described in this documentation, which was scribed in my presence. The recorded information has been reviewed and is accurate.   Joni Reining Sabine Tenenbaum, PA-C 02/09/14 1140

## 2014-02-09 NOTE — ED Notes (Signed)
Pt reports 8/10 pain to left great toe, pt reports being seen by podiatrist and was told that he had a callus, pt reports it has not gotten any better and pain has increased. Full ROM to toe, pt reports pain increases when walking. nad noted. Axo x4. Pulses present.

## 2014-02-09 NOTE — ED Provider Notes (Signed)
Medical screening examination/treatment/procedure(s) were performed by non-physician practitioner and as supervising physician I was immediately available for consultation/collaboration.   EKG Interpretation None       Doug Sou, MD 02/09/14 1818

## 2014-02-09 NOTE — ED Notes (Signed)
Declined W/C at D/C and was escorted to lobby by RN. 

## 2014-02-09 NOTE — Discharge Instructions (Signed)
Take vicodin for breakthrough pain, do not drink alcohol, drive, care for children or do other critical tasks while taking vicodin.  Do not hesitate to return to the emergency room for any new, worsening or concerning symptoms.  Please obtain primary care using resource guide below. But the minute you were seen in the emergency room and that they will need to obtain records for further outpatient management.   Emergency Department Resource Guide 1) Find a Doctor and Pay Out of Pocket Although you won't have to find out who is covered by your insurance plan, it is a good idea to ask around and get recommendations. You will then need to call the office and see if the doctor you have chosen will accept you as a new patient and what types of options they offer for patients who are self-pay. Some doctors offer discounts or will set up payment plans for their patients who do not have insurance, but you will need to ask so you aren't surprised when you get to your appointment.  2) Contact Your Local Health Department Not all health departments have doctors that can see patients for sick visits, but many do, so it is worth a call to see if yours does. If you don't know where your local health department is, you can check in your phone book. The CDC also has a tool to help you locate your state's health department, and many state websites also have listings of all of their local health departments.  3) Find a [facility] If your illness is not likely to be very severe or complicated, you may want to try a walk in clinic. These are popping up all over the country in pharmacies, drugstores, and shopping centers. They're usually staffed by nurse practitioners or physician assistants that have been trained to treat common illnesses and complaints. They're usually fairly quick and inexpensive. However, if you have serious medical issues or chronic medical problems, these are probably not your best option.  No  Primary Care Doctor: - Call Health Connect at  [phone] - they can help you locate a primary care doctor that  accepts your insurance, provides certain services, etc. - Physician Referral Service- [phone]  Chronic Pain Problems: Organization         Address  Phone   Notes  [facility]  [phone] Patients need to be referred by their primary care doctor.   Medication Assistance: Organization         Address  Phone   Notes  [facility] Medication [facility] [address] [place], [place] [address] [phone] --Must be a resident of [facility] -- Must have NO insurance coverage whatsoever (no Medicaid/ Medicare, etc.) -- The pt. MUST have a primary care doctor that directs their care regularly and follows them in the community   MedAssist  [phone]   [employer]  [phone]    Agencies that provide inexpensive medical care: Organization         Address  Phone   Notes  [place]  [phone]   [facility]    [phone]   [facility] [place],  [address] [phone]   Norris Kibler [phone]   Planned Parenthood    [phone]   [facility]    [phone]   Community Health and [facility]  [address], [place] Phone:  [phone], Fax:  [phone] Hours of Operation:  9 am - 6 pm, M-F.  Also accepts Medicaid/Medicare and self-pay.  [facility] for [place], [address], [place] Phone: [phone], Fax: [phone]. Hours of Operation:  8:30 am - 5:30 pm, M-F.  Also accepts Medicaid and self-pay.  [facility] [place] [address], [place] Phone: [phone]   [place], [place], [place] [phone], Ext. [phone] Mondays & Thursdays: 7-9 AM.  First 15 patients are seen on  a first come, first serve basis.    Pray Providers:  Organization         Address  Phone   Notes  [facility] [address],  [phone] Also accepts self-pay patients.  [facility] [address], [place]  [phone]   [place], [address], [place] [phone]   [facility] [address], [place] [phone]   Lucianne Lei [address], [place]   [phone] Only accepts [place] Access [place] patients after they have their name applied to their card.   Self-Pay (no insurance) in [facility]:  Organization         Address  Phone   Notes  Sickle Cell Patients, [facility] [place] [phone]   [facility] [place] [phone]   [facility] [place]  [place], [place], [place] [phone]   [facility]/Dr. Osei-Bonsu  [address] [place] or [address], [place] [phone] Phone number for both [place] and [place] locations is the same.  Urgent Medical and [facility] [address] [place] [phone]   [facility] [address] [place] or [address] [phone]   [facility] [address] [place] [phone], phone; [phone], fax Sees patients 1st and 3rd Saturday of every month.  Must not qualify for public or private insurance (i.e. Medicaid, Medicare, [place] Health Choice, Veterans' Benefits)  Household income should be no more than 200% of the poverty level The clinic cannot treat you if you are pregnant or think you are pregnant  Sexually transmitted diseases are not treated at the clinic.    Dental Care: Organization          Address  Phone  Notes  [facility] Department of [facility] [place] [phone] Accepts children up to age 4 who are enrolled in [place] or [place]; pregnant women with a Medicaid card; and children who have applied for Medicaid or [place] Health Choice, but were declined, whose parents can pay a reduced fee at time of service.  [facility] Department of [facility]  [address], [place] [phone] Accepts children up to age 69 who are enrolled in [place] or [place]; pregnant women with a Medicaid card; and children who have applied for Medicaid or [place] Health Choice, but were declined, whose parents can pay a reduced fee at time of service.  [facility] Adult Dental Access PROGRAM  [place], [place] [phone]  Patients are seen by appointment only. Walk-ins are not accepted. [place] will see patients 62 years of age and older. Monday - Tuesday (8am-5pm) Most Wednesdays (8:30-5pm) $30 per visit, cash only  [facility] Adult Dental Access PROGRAM  [address], [facility] [phone] Patients are seen by appointment only. Walk-ins are not accepted. [place] will see patients 76 years of age and older. One Wednesday Evening (Monthly: Volunteer Based).  $30 per visit, cash only  [place]  [phone] for adults; Children under age 48, call Graduate Pediatric Dentistry at [phone]. Children aged 49-14, please call [phone] to request a pediatric application.  Dental services are provided in all areas of dental care including fillings, crowns and bridges, complete and partial dentures, implants, gum treatment, root canals, and extractions. Preventive care is also provided. Treatment is provided to both adults and children. Patients are selected via a lottery and there is often a waiting list.   [facility] [address] [place]  [phone] [email or web address]   [facility] [address] [place], [place] [phone], Ext. [phone] Second and Fourth Thursday of each month, opens at 6:30 AM; Clinic ends at 9 AM.  Patients are seen on a first-come first-served basis, and a limited number are seen during each clinic.   [facility]  [address] Hillard Danker [place], [place] [phone]   Eligibility Requirements You must have lived in [place], [place], or [place] counties for at least the last three months.   You cannot be eligible for state or federal sponsored [employer], including [employer], [place], or [employer].   You generally cannot be eligible for healthcare insurance through your employer.    How to apply: Eligibility screenings are held every Tuesday and Wednesday afternoon from 1:00 pm until 4:00 pm. You do not need an appointment for the interview!  [facility] [address] [place], [place]   [place]  [place] Department  [place]  [phone]    Behavioral Health Resources in the Community: Intensive Outpatient Programs Organization         Address  Phone  Notes  [place]. [address] [place], [place] [phone]   [facility] Outpatient [address], [place], [place]   ADS: Alcohol & Drug Svcs [address] [place], [place]   [place] [address],  [place], [place] or [phone]   Substance Abuse Resources Organization         Address  Phone  Notes  Alcohol and Drug Services  [phone]   [place]  [phone]   The [place]   Chinita Pester  [phone]   Residential & Outpatient Substance Abuse Program  [phone]   Psychological  Services Organization         Address  Phone  Notes  [facility] [place]  [place]  [phone]   [place] [address], [place] or [phone]    Mobile Crisis Teams Organization         Address  Phone  Notes  [facility]rapeutic Alternatives, Mobile Crisis Care Unit  [phone]   Assertive Psychotherapeutic Services  [address] [place], [place]   [facility] [address], [place]  [place] [phone]    Self-Help/Support Groups Organization         Address  Phone             Notes  Mental Health Assoc. of [place] - variety of support groups  Lucien Call for more information  Narcotics Anonymous (NA), [facility] [address], [employer] [place]  2 meetings at this location   Special educational needs teacher         Address  Phone  Notes  ASAP Residential Treatment [place],    [place]  [phone]   [facility]  [address] [place] [phone], [place], [place]   [place], [place] [phone] Admissions: 8am-3pm M-F  Incentives Substance [place] at [place] [address]    [place], [place] [phone]   [facility] [address] [place], [place], [place]   [facility] [address],  [place], [place]   [facility]ight Programs - Intensive Outpatient [address], [place], [place]   [facility] ([place].,  [place], [place] [phone] or [phone]   [facility]idential Treatment Services (RTS) [address], [place], [place] Accepts Medicaid  [facility] [place] [address],  [place] [phone] Substance Abuse/Addiction Treatment   [facility] Organization         Address  Phone  Notes  [facility]  [phone]   Domenic Schwab, PhD [address] Arlis [place], [place]   [phone] or [phone]   [place], [place] [phone]   [facility] [phone] [place], [place] [phone] Insurance/Medicaid/sponsorship through [employer] [facility] [facility] and Families [address], [address]                                    [place], [place] [phone] [place] [address], [place] [phone]    Dr. Adele Schilder  [phone]   [facility] Dept. 1) [address] [place] 2) [place] 3)  [place] [address], [place] [phone]  [phone]   [place] [phone] or [phone] (After Hours)

## 2014-04-30 ENCOUNTER — Emergency Department (HOSPITAL_COMMUNITY)
Admission: EM | Admit: 2014-04-30 | Discharge: 2014-04-30 | Disposition: A | Discharge status: Home / Self Care | Payer: Medicaid Other | Attending: Emergency Medicine | Admitting: Emergency Medicine

## 2014-04-30 ENCOUNTER — Encounter (HOSPITAL_COMMUNITY): Payer: Self-pay | Admitting: Emergency Medicine

## 2014-04-30 DIAGNOSIS — Z720 Tobacco use: Secondary | ICD-10-CM | POA: Insufficient documentation

## 2014-04-30 DIAGNOSIS — R591 Generalized enlarged lymph nodes: Secondary | ICD-10-CM | POA: Insufficient documentation

## 2014-04-30 DIAGNOSIS — R590 Localized enlarged lymph nodes: Secondary | ICD-10-CM

## 2014-04-30 DIAGNOSIS — Z9889 Other specified postprocedural states: Secondary | ICD-10-CM | POA: Insufficient documentation

## 2014-04-30 DIAGNOSIS — R109 Unspecified abdominal pain: Secondary | ICD-10-CM | POA: Diagnosis present

## 2014-04-30 MED ORDER — IBUPROFEN 800 MG PO TABS
800.0000 mg | ORAL_TABLET | Freq: Once | ORAL | Status: AC
  Administered 2014-04-30: 800 mg via ORAL
  Filled 2014-04-30: qty 1

## 2014-04-30 MED ORDER — IBUPROFEN 600 MG PO TABS: 600.0000 mg | ORAL_TABLET | Freq: Three times a day (TID) | ORAL | Status: DC | PRN

## 2014-04-30 MED ORDER — OXYCODONE-ACETAMINOPHEN 5-325 MG PO TABS
1.0000 | ORAL_TABLET | Freq: Once | ORAL | Status: AC
  Administered 2014-04-30: 1 via ORAL
  Filled 2014-04-30: qty 1

## 2014-04-30 NOTE — ED Provider Notes (Signed)
CSN: [id number]     Arrival date & time 04/30/14  [id number] History   First MD Initiated Contact with Patient 04/30/14 [phone]     Chief Complaint  Patient presents with  . Abscess      HPI Patient presents to the emergency department complaining of left inguinal discomfort and pain over the past week.  He states no nausea or vomiting.  It's worse with walking.  Denies pain in his left leg.  No scrotal pain or testicular pain.  No penile pain or penile discharge.  No fevers or chills.  No prior history of swelling in this region.  He has had a left inguinal hernia repair in 2011 [facility].  He states the swelling is more distal and inferior to his inguinal hernia.  He denies new scrotal or testicular masses.  Pain is mild in severity and worse with movement of his left leg and palpation.  No redness or rash noted.  No infectious symptoms in his left leg.   History reviewed. No pertinent past medical history. Past Surgical History  Procedure Laterality Date  . Hernia repair    . Cardiac surgery    . Abdominal surgery     History reviewed. No pertinent family history. History  Substance Use Topics  . Smoking status: Current Every Day Smoker -- 0.50 packs/day  . Smokeless tobacco: Not on file  . Alcohol Use: Yes     Comment: social    Review of Systems  All other systems reviewed and are negative.     Allergies  Review of patient's allergies indicates no known allergies.  Home Medications   Prior to Admission medications   Medication Sig Start Date End Date Taking? Authorizing Provider  HYDROcodone-acetaminophen (NORCO/VICODIN) 5-325 MG per tablet Take 1-2 tablets by mouth every 6 hours as needed for pain. 02/09/14   Nicole Pisciotta, PA-C  ibuprofen (ADVIL,MOTRIN) 600 MG tablet Take 1 tablet (600 mg total) by mouth every 8 (eight) hours as needed. 04/30/14   Lyanne CoKevin M Shawndale Kilpatrick, MD   BP 110/60  Pulse 62  Temp(Src) 98.3 F (36.8 C) (Oral)  Resp 20  SpO2  95% Physical Exam  Nursing note and vitals reviewed. Constitutional: He is oriented to person, place, and time. He appears well-developed and well-nourished.  HENT:  Head: Normocephalic and atraumatic.  Eyes: EOM are normal.  Neck: Normal range of motion.  Cardiovascular: Normal rate.   Pulmonary/Chest: Effort normal.  Abdominal: Soft. He exhibits no distension. There is no tenderness.  Genitourinary:  No scrotal changes.  Normal testicles bilaterally.  Uncircumcised penis without tenderness or discharge.  Left inguinal region with small lymphadenopathy palpable below the inguinal ligament on the left.  No palpable mass noted in the left inguinal canal.  No significant tenderness in the left inguinal canal.  The overlying skin changes of the left groin   Musculoskeletal: Normal range of motion.  Neurological: He is alert and oriented to person, place, and time.  Skin: Skin is warm and dry.  Psychiatric: He has a normal mood and affect. Judgment normal.    ED Course  Procedures (including critical care time) Labs Review Labs Reviewed - No data to display  Imaging Review No results found.   EKG Interpretation None      MDM   Final diagnoses:  Inguinal lymphadenopathy    This appears to be isolated left inguinal lymphadenopathy.  I recommended anti-inflammatories and observation as an outpatient.  The patient will monitor this.  Asked that he return to the ER for any new or worsening symptoms.  He will followup with [facility] and schedule an appointment.  If this persists he may benefit from biopsy.  He has no signs of left inguinal hernia at this time.  No infectious symptoms in his left leg.  No GU symptoms.  He understands to return to the ER for new or worsening symptoms.    Lyanne Co, MD 04/30/14 1021

## 2014-04-30 NOTE — ED Notes (Signed)
Per pt sts he has a knot underneath where he had hernia surgery. sts it is very painful x 1 week.

## 2014-04-30 NOTE — Discharge Instructions (Signed)

## 2014-05-16 ENCOUNTER — Emergency Department (HOSPITAL_COMMUNITY): Payer: Medicaid Other

## 2014-05-16 ENCOUNTER — Encounter (HOSPITAL_COMMUNITY): Payer: Self-pay | Admitting: *Deleted

## 2014-05-16 ENCOUNTER — Emergency Department (HOSPITAL_COMMUNITY)
Admission: EM | Admit: 2014-05-16 | Discharge: 2014-05-16 | Disposition: A | Discharge status: Home / Self Care | Payer: Medicaid Other | Attending: Emergency Medicine | Admitting: Emergency Medicine

## 2014-05-16 DIAGNOSIS — Z9889 Other specified postprocedural states: Secondary | ICD-10-CM | POA: Diagnosis not present

## 2014-05-16 DIAGNOSIS — R079 Chest pain, unspecified: Secondary | ICD-10-CM

## 2014-05-16 DIAGNOSIS — Z720 Tobacco use: Secondary | ICD-10-CM | POA: Diagnosis not present

## 2014-05-16 DIAGNOSIS — R0789 Other chest pain: Secondary | ICD-10-CM

## 2014-05-16 DIAGNOSIS — R011 Cardiac murmur, unspecified: Secondary | ICD-10-CM | POA: Diagnosis not present

## 2014-05-16 LAB — BASIC METABOLIC PANEL
Anion gap: 12 (ref 5–15)
BUN: 7 mg/dL (ref 6–23)
CALCIUM: 9.1 mg/dL (ref 8.4–10.5)
CO2: 26 mEq/L (ref 19–32)
CREATININE: 0.71 mg/dL (ref 0.50–1.35)
Chloride: 100 mEq/L (ref 96–112)
GFR calc Af Amer: >90 mL/min (ref >90)
GLUCOSE: 106 mg/dL — AB (ref 70–99)
Potassium: 3.8 mEq/L (ref 3.7–5.3)
Sodium: 138 mEq/L (ref 137–147)

## 2014-05-16 LAB — I-STAT TROPONIN, ED: Troponin i, poc: 0.01 ng/mL (ref 0.00–0.08)

## 2014-05-16 LAB — CBC
HEMATOCRIT: 34.1 % — AB (ref 39.0–52.0)
HEMOGLOBIN: 12.2 g/dL — AB (ref 13.0–17.0)
MCH: 30.9 pg (ref 26.0–34.0)
MCHC: 35.8 g/dL (ref 30.0–36.0)
MCV: 86.3 fL (ref 78.0–100.0)
Platelets: 176 10*3/uL (ref 150–400)
RBC: 3.95 MIL/uL — ABNORMAL LOW (ref 4.22–5.81)
RDW: 12 % (ref 11.5–15.5)
WBC: 8.3 10*3/uL (ref 4.0–10.5)

## 2014-05-16 NOTE — ED Notes (Signed)
Pt denies chest pain or SOB on discharge. Pt ambulatory on discharge.

## 2014-05-16 NOTE — ED Notes (Signed)
Plan of care discussed with PA.

## 2014-05-16 NOTE — ED Provider Notes (Signed)
Medical screening examination/treatment/procedure(s) were conducted as a shared visit with non-physician practitioner(s) and myself.  I personally evaluated the patient during the encounter.   EKG Interpretation   Date/Time:  Friday May 16 2014 11:06:26 EST Ventricular Rate:  68 PR Interval:  220 QRS Duration: 86 QT Interval:  448 QTC Calculation: 476 R Axis:   66 Text Interpretation:  Sinus rhythm Left ventricular hypertrophy  REPOLARIZATION ABNORMALITY No significant change was found Confirmed by  [facility]  MD, TREY ([id number]) on 05/16/2014 11:12:03 AM      38 yo male with hx of congenital heart disease presenting with left sided chest pain.  Present for several days, but better today.  At time of my interview, no pain.  No associated SOB, dizziness, diaphoresis, or nausea.  On exam, well appearing, nontoxic, not distressed, normal respiratory effort, normal perfusion, chest wall nontender, II/VI systolic murmur, lungs CTAB.  Pain does not appear cardiac in nature.  Also low suspicion for PE and PERC negative.  Pt reports that his chest was tender prior to his pain resolving, likely representing MSK chest pain.  His EKG is abnormal, but similar to prior.    Clinical Impression: 1. Chest wall pain   2. Chest pain       Warnell Forester, MD 05/16/14 [phone]

## 2014-05-16 NOTE — Discharge Instructions (Signed)
Chest Wall Pain °Chest wall pain is pain in or around the bones and muscles of your chest. It may take up to 6 weeks to get better. It may take longer if you must stay physically active in your work and activities.  °CAUSES  °Chest wall pain may happen on its own. However, it may be caused by: °· A viral illness like the flu. °· Injury. °· Coughing. °· Exercise. °· Arthritis. °· Fibromyalgia. °· Shingles. °HOME CARE INSTRUCTIONS  °· Avoid overtiring physical activity. Try not to strain or perform activities that cause pain. This includes any activities using your chest or your abdominal and side muscles, especially if heavy weights are used. °· Put ice on the sore area. °¨ Put ice in a plastic bag. °¨ Place a towel between your skin and the bag. °¨ Leave the ice on for 15-20 minutes per hour while awake for the first 2 days. °· Only take over-the-counter or prescription medicines for pain, discomfort, or fever as directed by your caregiver. °SEEK IMMEDIATE MEDICAL CARE IF:  °· Your pain increases, or you are very uncomfortable. °· You have a fever. °· Your chest pain becomes worse. °· You have new, unexplained symptoms. °· You have nausea or vomiting. °· You feel sweaty or lightheaded. °· You have a cough with phlegm (sputum), or you cough up blood. °MAKE SURE YOU:  °· Understand these instructions. °· Will watch your condition. °· Will get help right away if you are not doing well or get worse. °Document Released: 06/27/2005 Document Revised: 09/19/2011 Document Reviewed: 02/21/2011 °ExitCare® Patient Information ©2015 [employer]. This information is not intended to replace advice given to you by your health care provider. Make sure you discuss any questions you have with your health care provider. ° ° °Emergency Department Resource Guide °1) Find a Doctor and Pay Out of Pocket °Although you won't have to find out who is covered by your insurance plan, it is a good idea to ask around and get recommendations. You  will then need to call the office and see if the doctor you have chosen will accept you as a new patient and what types of options they offer for patients who are self-pay. Some doctors offer discounts or will set up payment plans for their patients who do not have insurance, but you will need to ask so you aren't surprised when you get to your appointment. ° °2) Contact Your Local Health Department °Not all health departments have doctors that can see patients for sick visits, but many do, so it is worth a call to see if yours does. If you don't know where your local health department is, you can check in your phone book. The CDC also has a tool to help you locate your state's health department, and many state websites also have listings of all of their local health departments. ° °3) Find a Walk-in Clinic °If your illness is not likely to be very severe or complicated, you may want to try a walk in clinic. These are popping up all over the country in pharmacies, drugstores, and shopping centers. They're usually staffed by nurse practitioners or physician assistants that have been trained to treat common illnesses and complaints. They're usually fairly quick and inexpensive. However, if you have serious medical issues or chronic medical problems, these are probably not your best option. ° °No Primary Care Doctor: °- Call [facility] Connect at  832-8000 - they can help you locate a primary care doctor that    accepts your insurance, provides certain services, etc. °- Physician Referral Service- 1-800-533-3463 ° °Chronic Pain Problems: °Organization         Address  Phone   Notes  °[facility]  (336) 297-2271 Patients need to be referred by their primary care doctor.  ° °Medication Assistance: °Organization         Address  Phone   Notes  °[place] [facility] [address] °[place], [place] [address] (336) 641-8030 --Must be a resident of [place] °-- Must  have NO insurance coverage whatsoever (no Medicaid/ Medicare, etc.) °-- The pt. MUST have a primary care doctor that directs their care regularly and follows them in the community °  °[facility]  (866) 331-1348   °[employer]  (888) 892-1162   ° °Agencies that provide inexpensive medical care: °Organization         Address  Phone   Notes  °[facility]  (336) 832-8035   °[facility]    (336) 832-7272   °[facility] [address] °[place], [place] [address] (336) 832-4777   °[facility] [address], °[place] (336) 271-4999   °[facility]    (336) 373-0678   °[facility]    (336) 272-1050   °[facility] ° [address], [place] Phone:  (336) 832-4444, Fax:  (336) 832-4440 Hours of Operation:  9 am - 6 pm, M-F.  Also accepts Medicaid/Medicare and self-pay.  °[facility] ° [address], [place] Phone: (336) 832-3150, Fax: (336) 832-3151. Hours of Operation:  8:30 am - 5:30 pm, M-F.  Also accepts Medicaid and self-pay.  °[facility] [place] [address], [place] Phone: (336) 878-6027   °[facility] [address], [place], [place] (336)723-1848, Ext. 123 Mondays & Thursdays: 7-9 AM.  First 15 patients are seen on a first come, first serve basis. °  ° °Medicaid-accepting [place] Providers: ° °Organization         Address  Phone   Notes  °[facility] [address], [place] (336) 641-2100 Also accepts self-pay patients.  °[facility] [address], [place] ° (336) 856-9996   °[facility] [address], [place] (336) 288-8857   °[facility] [address] [place] [address], [place] (336) 299-7000   °Veita Bland [address], [place]  ° (336) 373-1557 Only accepts [place] Access Medicaid patients after  they have their name applied to their card.  ° °Self-Pay (no insurance) in [place]: ° °Organization         Address  Phone   Notes  °Sickle Cell Patients, [facility] [address], [place] (336) 832-1970   °[facility] [address], [place] (336) 832-4400   °[facility] ° [address], [place] (336) 992-4800   °[facility]/Dr. Osei-Bonsu ° [address] [place] [address], [place] or [address], [place] (336) 841-8500 Phone number for both [place] and [place] locations is the same.  °[facility] [address],  (336) 299-0000   °[facility]  [address] [place] [address],  or [address] (336) 852-7530 °(336) 878-2260   °[facility] [address], [place] (336) 350-1642, phone; (336) 294-5005, fax Sees patients 1st and 3rd Saturday of every month.  Must not qualify for public or private insurance (i.e. Medicaid, Medicare, [place] Health Choice, Veterans' Benefits) • Household income should be no more than 200% of the poverty level •The clinic cannot treat you if you are pregnant or think you are pregnant • Sexually transmitted diseases are not treated at the clinic.  ° ° °Dental Care: °Organization         Address  Phone  Notes  °[facility] [facility] [address], [place] (336) 641-6152 Accepts children up to age 21 who are enrolled in Medicaid or [place] Health Choice; pregnant women with a Medicaid card; and children who have applied for Medicaid or [place] Health Choice, but were declined, whose parents can pay a reduced fee at time of service.  °[facility] [place]  [address], [place] (336) 641-7733 Accepts children up to age 21 who are enrolled in Medicaid or [place] Health Choice; pregnant women with a Medicaid card; and children who  have applied for Medicaid or [place] Health Choice, but were declined, whose parents can pay a reduced fee at time of service.  °[facility] ° [address], [place] (336) 641-4533 Patients are seen by appointment only. Walk-ins are not accepted. [facility] will see patients 18 years of age and older. °Monday - Tuesday (8am-5pm) °Most Wednesdays (8:30-5pm) °$30 per visit, cash only  °[facility] ° [address], [place] (336) 641-4533 Patients are seen by appointment only. Walk-ins are not accepted. [facility] will see patients 18 years of age and older. °One Wednesday Evening (Monthly: Volunteer Based).  $30 per visit, cash only  °[facility]  (919) 537-3737 for adults; Children under age 4, call Graduate Pediatric Dentistry at (919) [phone]. Children aged 4-14, please call (919) [phone] to request a pediatric application. ° Dental services are provided in all areas of dental care including fillings, crowns and bridges, complete and partial dentures, implants, gum treatment, root canals, and extractions. Preventive care is also provided. Treatment is provided to both adults and children. °Patients are selected via a lottery and there is often a waiting list. °  °[facility] [address], °[place] ° (336) 763-8833 [email or web address] °  °[facility] [address], [place], [place] (336)723-1848, Ext. 123 Second and Fourth Thursday of each month, opens at 6:30 AM; Clinic ends at 9 AM.  Patients are seen on a first-come first-served basis, and a limited number are seen during each clinic.  ° °[facility] ° [address], [place], [place] (336) 723-7904   Eligibility Requirements °You must have lived in [place], [place], or [place] counties for at least the last three months. °  You cannot be eligible for state or federal sponsored healthcare insurance, including Veterans  Administration, Medicaid, or Medicare. °  You generally cannot be eligible for healthcare insurance through your employer.  °  How to apply: °Eligibility screenings are held every Tuesday and Wednesday afternoon from 1:00 pm until 4:00 pm. You do not need an appointment for the interview!  °[facility] [address], [place], [place] 336-631-2330   °[facility] [facility]  336-342-8273   °[place]  336-703-3100   °[facility]    336-570-6415   ° °Behavioral Health Resources in the Community: °Intensive Outpatient Programs °Organization         Address  Phone  Notes  °[facility] [address], [place], [place] 336-878-6098   °[facility] [address], [place], [place] 336-832-9800   °[facility] [address], [place], [place] ° 336-882-2125   °[facility] [address],  °[place], [place] 1-800-853-5163 or 336-641-4981   °Substance Abuse Resources °Organization         Address  Phone  Notes  °[facility]  336-882-2125   °[facility]  336-784-9470   °[facility]  336-285-9073   °[facility]  336-845-3988   °Residential & Outpatient Substance Abuse Program  1-800-659-3381   °Psychological Services °Organization         Address  Phone  Notes  °[facility]  336- 832-9600   °[facility]  336- 378-7881   °[facility] [address], [place] 1-800-853-5163 or 336-641-4981   ° °Mobile Crisis Teams °Organization         Address  Phone  Notes  °[facility], Mobile Crisis Care Unit  1-877-626-1772   °Assertive °[facility] ° [address] [place], [place] 336-834-9664   °Sharon DeEsch [address] °[place] 336-554-5454   ° °Self-Help/Support Groups °Organization         Address  Phone             Notes  °[facility] -  variety of support groups  336- 373-1402 Call for more information  °Narcotics Anonymous (NA), [facility] [address], °[place]  2 meetings at this location  ° °Residential Treatment Programs °Organization         Address  Phone  Notes  °[facility] [address],    °[place]  1-866-801-8205   °[facility] ° [address], [place], [place] 704-293-8524   °[facility] [address], [place] 336-845-3988 Admissions: 8am-3pm M-F  °[facility] [address]    °[place], [place] 336-841-1104   °[facility] [address], [place], [place] 336-379-7146   °[facility] [address]  °[place], [place] 336-285-9073   °[facility] - Intensive Outpatient [address], [place], [place] 336-852-3033   °[facility] [address]  °[place], [place] 1-877-615-2722 or 336-784-9470   °[facility] [address] [place], [place] 336-227-7417 Accepts Medicaid  °[facility] [address]  °[place] 1-800-659-3381 Substance Abuse/Addiction Treatment  ° °[place] Behavioral Health Resources °Organization         Address  Phone  Notes  °[facility]  (888) 581-9988   °Julie Brannon, PhD [address] [place], [place]   (336) 349-5553 or (336) 951-0000   °[facility]   [address] °[place],  (336) 349-4454   °[facility] [address], [place],  (336) 342-8316 Insurance/Medicaid/sponsorship through [facility]  °[facility] [address]                                    [place],  (336) 342-8316 Therapy/tele-psych/case  °[facility] [address]  ° [place], [place] (336) 349-2233    °Dr. Arfeen  (336) 349-4544   °[facility] [place]  [employer] [place] [facility] 1) [address], [place] °2) [address], [place] °3)  [address], [place] (336) 349-3220 °(336) 342-7768 ° °(336) 342-8140   °[place] [facility] (336) 342-1394 or (336) 342-3537 (After Hours)    ° ° ° °

## 2014-05-16 NOTE — ED Notes (Signed)
Pt reports left side chest pains that are sharp, started two weeks ago, pain increases when lying down. Does having hx of cardiac surgery. No acute distress noted at triage, ekg done.

## 2014-05-16 NOTE — ED Provider Notes (Addendum)
CSN: [id number]     Arrival date & time 05/16/14  1018 History   First MD Initiated Contact with Patient 05/16/14 1056     Chief Complaint  Patient presents with  . Chest Pain     (Consider location/radiation/quality/duration/timing/severity/associated sxs/prior Treatment) HPI Comments: Patient presents today with a chief complaint of chest pain.  He reports that the pain has been present for the past week.  Pain located left anterior chest and does not radiate.  He reports that the pain is worse when lying on his left side and also with palpation.  He reports that the pain has improved today.  He states that over the past week the area has been tender to palpation.  He reports pain is better when he stretches.  However, today it was not tender which prompted him to come in.  He denies any chest pain at all at this time.  He states that he has not taken anything for pain.  He denies SOB, nausea, vomiting, diaphoresis, cough, fever, chills, LE edema, dizziness, or lightheadedness.  He reports that he does have a history of a Congenital Heart defect and had surgery when he was [age] years old.  No other cardiac history.  No history of HTN, Hyperlipidemia, or DM.  He reports that he currently does smoke.  He denies history of PE or DVT.  Denies prolonged travel or surgeries in the past 4 weeks.  No LE edema or pain.  The history is provided by the patient.    History reviewed. No pertinent past medical history. Past Surgical History  Procedure Laterality Date  . Hernia repair    . Cardiac surgery    . Abdominal surgery     History reviewed. No pertinent family history. History  Substance Use Topics  . Smoking status: Current Every Day Smoker -- 0.50 packs/day  . Smokeless tobacco: Not on file  . Alcohol Use: Yes     Comment: social    Review of Systems  All other systems reviewed and are negative.     Allergies  Aspirin  Home Medications   Prior to Admission medications    Medication Sig Start Date End Date Taking? Authorizing Provider  HYDROcodone-acetaminophen (NORCO/VICODIN) 5-325 MG per tablet Take 1-2 tablets by mouth every 6 hours as needed for pain. 02/09/14   Nicole Pisciotta, PA-C  ibuprofen (ADVIL,MOTRIN) 600 MG tablet Take 1 tablet (600 mg total) by mouth every 8 (eight) hours as needed. 04/30/14   Lyanne CoKevin M Campos, MD   BP 141/71 mmHg  Pulse 87  Temp(Src) 98.3 F (36.8 C) (Oral)  Resp 18  SpO2 99% Physical Exam  Constitutional: He appears well-developed and well-nourished. No distress.  HENT:  Head: Normocephalic and atraumatic.  Mouth/Throat: Oropharynx is clear and moist.  Neck: Normal range of motion. Neck supple.  Cardiovascular: Normal rate and regular rhythm.   Murmur heard. Pulses:      Dorsalis pedis pulses are 2+ on the right side, and 2+ on the left side.  Pulmonary/Chest: Effort normal and breath sounds normal. No respiratory distress. He has no wheezes. He has no rales. He exhibits no tenderness.  Abdominal: Soft. There is no tenderness.  Musculoskeletal: Normal range of motion.  No LE edema bilaterally  Neurological: He is alert.  Skin: Skin is warm and dry. He is not diaphoretic.  Psychiatric: He has a normal mood and affect.  Nursing note and vitals reviewed.   ED Course  Procedures (including critical care time) Labs Review  Labs Reviewed  CBC  BASIC METABOLIC PANEL  Rosezena Sensor, ED    Imaging Review Dg Chest 2 View  05/16/2014   CLINICAL DATA:  38 year old male with left side chest pain x2 weeks. Initial encounter. Personal history of congenital heart disease, cardiac surgery, situs abnormality/abdominal heterotaxy syndrome.  EXAM: CHEST  2 VIEW  COMPARISON:  Chest radiographs 08/21/2009.  Chest CTA 08/10/2009.  FINDINGS: Interval resolved small pleural effusions. Stable cardiomegaly and mediastinal contours; with some central pulmonary artery enlargement. No pneumothorax or pulmonary edema. No consolidation.  Stable visualized osseous structures.  IMPRESSION: 1. No acute cardiopulmonary abnormality. Interval resolved small pleural effusions. 2. 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   Electronically Signed   By: Augusto Gamble M.D.   On: 05/16/2014 11:00     EKG Interpretation   Date/Time:  Friday May 16 2014 11:06:26 EST Ventricular Rate:  68 PR Interval:  220 QRS Duration: 86 QT Interval:  448 QTC Calculation: 476 R Axis:   66 Text Interpretation:  Sinus rhythm Left ventricular hypertrophy  REPOLARIZATION ABNORMALITY No significant change was found Confirmed by  [facility]  MD, TREY ([id number]) on 05/16/2014 11:12:03 AM      MDM   Final diagnoses:  Chest pain   Patient presents today with atypical chest pain that has been present for one week, which improved today.  No chest pain during ED course.  Labs today unremarkable.  No ischemic changes on EKG.  Troponin negative.  Patient with a heart score of 2.  Therefore, doubt cardiac etiology.  CXR negative for acute findings.  Patient is PERC negative.  Feel that the patient is stable for discharge.  Patient given resource guide.  Return precautions given.  Patient also evaluated by Dr. Loretha Stapler who is in agreement with the plan.      Santiago Glad, PA-C 05/16/14 1614  Warnell Forester, MD 05/16/14 1645  Santiago Glad, PA-C 05/17/14 1116  Warnell Forester, MD 05/23/14 1510

## 2016-11-23 ENCOUNTER — Ambulatory Visit (HOSPITAL_COMMUNITY)
Admission: EM | Admit: 2016-11-23 | Discharge: 2016-11-23 | Disposition: A | Discharge status: Home / Self Care | Payer: Medicaid Other | Attending: Family Medicine | Admitting: Family Medicine

## 2016-11-23 ENCOUNTER — Encounter (HOSPITAL_COMMUNITY): Payer: Self-pay | Admitting: Emergency Medicine

## 2016-11-23 DIAGNOSIS — R51 Headache: Secondary | ICD-10-CM | POA: Diagnosis not present

## 2016-11-23 DIAGNOSIS — J40 Bronchitis, not specified as acute or chronic: Secondary | ICD-10-CM

## 2016-11-23 DIAGNOSIS — G4489 Other headache syndrome: Secondary | ICD-10-CM

## 2016-11-23 DIAGNOSIS — J209 Acute bronchitis, unspecified: Secondary | ICD-10-CM

## 2016-11-23 DIAGNOSIS — K0889 Other specified disorders of teeth and supporting structures: Secondary | ICD-10-CM

## 2016-11-23 MED ORDER — METHYLPREDNISOLONE 4 MG PO TBPK: ORAL_TABLET | ORAL | 0 refills | Status: DC

## 2016-11-23 MED ORDER — AMOXICILLIN 875 MG PO TABS: 875.0000 mg | ORAL_TABLET | Freq: Two times a day (BID) | ORAL | 0 refills | Status: DC

## 2016-11-23 MED ORDER — KETOROLAC TROMETHAMINE 60 MG/2ML IM SOLN
INTRAMUSCULAR | Status: AC
  Filled 2016-11-23: qty 2

## 2016-11-23 MED ORDER — KETOROLAC TROMETHAMINE 60 MG/2ML IM SOLN
60.0000 mg | Freq: Once | INTRAMUSCULAR | Status: AC
  Administered 2016-11-23: 60 mg via INTRAMUSCULAR

## 2016-11-23 NOTE — ED Triage Notes (Signed)
Here for dental pain onset last night associated w/HA  Sts he has appt w/dentist next month   A&O x4... NAD... Ambulatory

## 2016-11-23 NOTE — ED Provider Notes (Signed)
CSN: [id number]     Arrival date & time 11/23/16  1500 History   None    No chief complaint on file.  (Consider location/radiation/quality/duration/timing/severity/associated sxs/prior Treatment) Patient c/o dental pain and headache.    Dental Pain  Location:  Upper Upper teeth location:  14/LU 1st molar Quality:  Aching Severity:  No pain Onset quality:  Sudden Duration:  2 days Timing:  Constant Chronicity:  New   History reviewed. No pertinent past medical history. Past Surgical History:  Procedure Laterality Date  . ABDOMINAL SURGERY    . CARDIAC SURGERY    . HERNIA REPAIR     History reviewed. No pertinent family history. Social History  Substance Use Topics  . Smoking status: Current Every Day Smoker    Packs/day: 0.50  . Smokeless tobacco: Never Used  . Alcohol use Yes     Comment: social    Review of Systems  Constitutional: Negative.   HENT: Positive for dental problem.   Eyes: Negative.   Respiratory: Negative.   Cardiovascular: Negative.   Gastrointestinal: Negative.   Endocrine: Negative.   Genitourinary: Negative.   Musculoskeletal: Negative.   Allergic/Immunologic: Negative.   Neurological: Negative.   Hematological: Negative.   Psychiatric/Behavioral: Negative.     Allergies  Aspirin  Home Medications   Prior to Admission medications   Medication Sig Start Date End Date Taking? Authorizing Provider  amoxicillin (AMOXIL) 875 MG tablet Take 1 tablet (875 mg total) by mouth 2 (two) times daily. 11/23/16   Deatra Canter, FNP  HYDROcodone-acetaminophen (NORCO/VICODIN) 5-325 MG per tablet Take 1-2 tablets by mouth every 6 hours as needed for pain. 02/09/14   Pisciotta, Joni Reining, PA-C  ibuprofen (ADVIL,MOTRIN) 600 MG tablet Take 1 tablet (600 mg total) by mouth every 8 (eight) hours as needed. 04/30/14   Azalia Bilis, MD  methylPREDNISolone (MEDROL DOSEPAK) 4 MG TBPK tablet Take 6-5-4-3-2-1 po qd 11/23/16   Deatra Canter, FNP   Meds Ordered  and Administered this Visit   Medications  ketorolac (TORADOL) injection 60 mg (60 mg Intramuscular Given 11/23/16 1542)    BP (!) 90/53 (BP Location: Right Arm)   Pulse 76   Temp 98.8 F (37.1 C) (Oral)   Resp 20   SpO2 98%  No data found.   Physical Exam  Constitutional: He appears well-developed.  HENT:  Head: Normocephalic and atraumatic.  Right Ear: External ear normal.  Left Ear: External ear normal.  Mouth/Throat: Oropharynx is clear and moist.  Poor dentition  Eyes: Conjunctivae and EOM are normal. Pupils are equal, round, and reactive to light.  Neck: Normal range of motion. Neck supple.  Cardiovascular: Normal rate, regular rhythm and normal heart sounds.   Pulmonary/Chest: Effort normal and breath sounds normal.  Nursing note and vitals reviewed.   Urgent Care Course     Procedures (including critical care time)  Labs Review Labs Reviewed - No data to display  Imaging Review No results found.   Visual Acuity Review  Right Eye Distance:   Left Eye Distance:   Bilateral Distance:    Right Eye Near:   Left Eye Near:    Bilateral Near:         MDM   1. Pain, dental   2. Other headache syndrome   3. Bronchitis    Amoxicillin 875mg  one po bid x 7 days Medrol dose pack as directed  Toradol 60mg  IM     Deatra Canter, FNP 11/23/16 2020

## 2017-02-05 ENCOUNTER — Emergency Department (HOSPITAL_COMMUNITY)
Admission: EM | Admit: 2017-02-05 | Discharge: 2017-02-05 | Disposition: A | Discharge status: Home / Self Care | Payer: Medicaid Other | Attending: Emergency Medicine | Admitting: Emergency Medicine

## 2017-02-05 ENCOUNTER — Encounter (HOSPITAL_COMMUNITY): Payer: Self-pay | Admitting: Emergency Medicine

## 2017-02-05 ENCOUNTER — Emergency Department (HOSPITAL_COMMUNITY): Payer: Medicaid Other

## 2017-02-05 DIAGNOSIS — I509 Heart failure, unspecified: Secondary | ICD-10-CM | POA: Diagnosis not present

## 2017-02-05 DIAGNOSIS — F17210 Nicotine dependence, cigarettes, uncomplicated: Secondary | ICD-10-CM | POA: Diagnosis not present

## 2017-02-05 DIAGNOSIS — R0600 Dyspnea, unspecified: Secondary | ICD-10-CM | POA: Diagnosis present

## 2017-02-05 DIAGNOSIS — R0602 Shortness of breath: Secondary | ICD-10-CM

## 2017-02-05 DIAGNOSIS — Z79899 Other long term (current) drug therapy: Secondary | ICD-10-CM | POA: Insufficient documentation

## 2017-02-05 HISTORY — DX: Nicotine dependence, unspecified, uncomplicated: F17.200

## 2017-02-05 HISTORY — DX: Cardiac murmur, unspecified: R01.1

## 2017-02-05 LAB — BASIC METABOLIC PANEL
Anion gap: 7 (ref 5–15)
BUN: 8 mg/dL (ref 6–20)
CO2: 23 mmol/L (ref 22–32)
Calcium: 8.3 mg/dL — ABNORMAL LOW (ref 8.9–10.3)
Chloride: 110 mmol/L (ref 101–111)
Creatinine, Ser: 0.78 mg/dL (ref 0.61–1.24)
GFR calc non Af Amer: >60 mL/min (ref >60)
Glucose, Bld: 95 mg/dL (ref 65–99)
POTASSIUM: 3.8 mmol/L (ref 3.5–5.1)
SODIUM: 140 mmol/L (ref 135–145)

## 2017-02-05 LAB — I-STAT TROPONIN, ED: Troponin i, poc: 0.08 ng/mL (ref 0.00–0.08)

## 2017-02-05 LAB — CBC
HEMATOCRIT: 37 % — AB (ref 39.0–52.0)
Hemoglobin: 12.6 g/dL — ABNORMAL LOW (ref 13.0–17.0)
MCH: 30.6 pg (ref 26.0–34.0)
MCHC: 34.1 g/dL (ref 30.0–36.0)
MCV: 89.8 fL (ref 78.0–100.0)
Platelets: 114 10*3/uL — ABNORMAL LOW (ref 150–400)
RBC: 4.12 MIL/uL — AB (ref 4.22–5.81)
RDW: 12.4 % (ref 11.5–15.5)
WBC: 6.2 10*3/uL (ref 4.0–10.5)

## 2017-02-05 LAB — BRAIN NATRIURETIC PEPTIDE: B Natriuretic Peptide: 737.3 pg/mL — ABNORMAL HIGH (ref 0.0–100.0)

## 2017-02-05 MED ORDER — SODIUM CHLORIDE 0.9 % IV BOLUS (SEPSIS)
500.0000 mL | Freq: Once | INTRAVENOUS | Status: AC
  Administered 2017-02-05: 500 mL via INTRAVENOUS

## 2017-02-05 MED ORDER — FUROSEMIDE 10 MG/ML IJ SOLN
20.0000 mg | Freq: Once | INTRAMUSCULAR | Status: AC
  Administered 2017-02-05: 20 mg via INTRAVENOUS
  Filled 2017-02-05: qty 2

## 2017-02-05 NOTE — ED Notes (Signed)
Pt verbalized understanding of AMA disposition and severity of heart failure diagnosis, pt agreed to return to ED for worsening symptoms and to follow up with cardiology. VSS and pt ambulatory upon discharge.

## 2017-02-05 NOTE — Discharge Instructions (Signed)
You likely have new heart failure.  This most often is evaluated in the hospital because there is a chance you could die or be permanently disabled to the point where you may never be able to exercise or have sexual intercourse again.  Please return to the ED at anytime should you change or mind, or if you symptoms worsening.  Please follow up with the cardiologist.

## 2017-02-05 NOTE — ED Notes (Signed)
ED Provider at bedside. 

## 2017-02-05 NOTE — ED Provider Notes (Signed)
[facility]-EMERGENCY DEPT Provider Note   CSN: [id number] Arrival date & time: 02/05/17  4098     History   Chief Complaint Chief Complaint  Patient presents with  . Shortness of Breath    HPI Gary [name] is a 41 y.o. male who has a history of premature birth, open-heart surgery, and previous abdominal surgery during his infancy. The patient knows little information about his medical diagnoses. He comes in today with complaint of exertional dyspnea. The patient states that for the past 6 months he has had progressively worsening exertional dyspnea. Over the past month. It has become much worse. He states that he is unable to walk to the top of a flight of stairs without feeling like he just splinted as hard as he can. He has to sit and is breathing very heavily trying to catch his breath. He states that yesterday he was with a male companion and had intercourse. He states that at the end he was so exhausted and short of breath that he was literally panting like he had just run a race. He is a single father raising 4 children. He is a daily smoker and smokes 1 pack daily. He states that he was told he had a heart murmur at some point but knows little about this as well. He denies high blood pressure, high cholesterol, but does not see a doctor on a regular basis. He denies wheezing. His job, which is physically difficult has also been affected by his exertional dyspnea. The patient denies orthopnea or swelling in his legs. He denies chest pain, diaphoresis, pain in his jaw or arms, nausea or vomiting.  Marland KitchenHPI  Past Medical History:  Diagnosis Date  . Murmur   . Smoker     There are no active problems to display for this patient.   Past Surgical History:  Procedure Laterality Date  . ABDOMINAL SURGERY    . CARDIAC SURGERY    . HERNIA REPAIR         Home Medications    Prior to Admission medications   Medication Sig Start Date End Date Taking? Authorizing Provider  amoxicillin  (AMOXIL) 875 MG tablet Take 1 tablet (875 mg total) by mouth 2 (two) times daily. 11/23/16   Deatra Canter, FNP  HYDROcodone-acetaminophen (NORCO/VICODIN) 5-325 MG per tablet Take 1-2 tablets by mouth every 6 hours as needed for pain. 02/09/14   Pisciotta, Joni Reining, PA-C  ibuprofen (ADVIL,MOTRIN) 600 MG tablet Take 1 tablet (600 mg total) by mouth every 8 (eight) hours as needed. 04/30/14   Azalia Bilis, MD  methylPREDNISolone (MEDROL DOSEPAK) 4 MG TBPK tablet Take 6-5-4-3-2-1 po qd 11/23/16   Deatra Canter, FNP    Family History No family history on file.  Social History Social History  Substance Use Topics  . Smoking status: Current Every Day Smoker    Packs/day: 0.50  . Smokeless tobacco: Never Used  . Alcohol use Yes     Comment: social     Allergies   Aspirin   Review of Systems Review of Systems  Ten systems reviewed and are negative for acute change, except as noted in the HPI.    Physical Exam Updated Vital Signs BP (!) 94/50 (BP Location: Left Arm)   Pulse (!) 55   Temp 97.7 F (36.5 C) (Oral)   Resp 18   SpO2 93%   Physical Exam  Constitutional: He appears well-nourished. No distress.  HENT:  Head: Normocephalic and atraumatic.  Eyes: Pupils are  equal, round, and reactive to light. Conjunctivae and EOM are normal. No scleral icterus.  Neck: Normal range of motion. Neck supple.  Cardiovascular:  Regularly irregular heart rate with sinus arrhythmia Marked change in rate with corresponding elevation and decline in blood pressures on the monitor. Loud MV murmur  Pulmonary/Chest: Effort normal and breath sounds normal. No respiratory distress.  Abdominal: Soft. There is no tenderness.  Musculoskeletal: He exhibits no edema.  Neurological: He is alert.  Skin: Skin is warm and dry. He is not diaphoretic.  Psychiatric: His behavior is normal.  Nursing note and vitals reviewed.    ED Treatments / Results  Labs (all labs ordered are listed, but only  abnormal results are displayed) Labs Reviewed  BASIC METABOLIC PANEL - Abnormal; Notable for the following:       Result Value   Calcium 8.3 (*)    All other components within normal limits  CBC - Abnormal; Notable for the following:    RBC 4.12 (*)    Hemoglobin 12.6 (*)    HCT 37.0 (*)    Platelets 114 (*)    All other components within normal limits  BRAIN NATRIURETIC PEPTIDE  I-STAT TROPONIN, ED    EKG  EKG Interpretation  Date/Time:  Sunday February 05 2017 08:56:23 EDT Ventricular Rate:  69 PR Interval:    QRS Duration: 100 QT Interval:  516 QTC Calculation: 552 R Axis:   90 Text Interpretation:  Sinus bradycardia with marked sinus arrhythmia with Premature atrial complexes with Abberant conduction Rightward axis Left ventricular hypertrophy T wave abnormality, consider inferolateral ischemia Abnormal ECG No significant change since last tracing Confirmed by Melene Plan [phone]) on 02/05/2017 9:00:07 AM       Radiology Dg Chest 2 View  Result Date: 02/05/2017 CLINICAL DATA:  activity makes him tired and short of breath. PT states he does smoke, and he feels like he just cant breath well or take a good breath. This comes and goes and is not constant. Pt reports he is not short of breath currently. He does feel tired though. Denies chest pain. PT states hx of open heart surgery as a child, hx of murmur. EXAM: CHEST - 2 VIEW COMPARISON:  05/16/2014 FINDINGS: Central pulmonary vascular congestion and interstitial prominence, increased since previous exam. Progressive cardiomegaly since prior study. No effusion. Mild thoracic dextroscoliosis as before. IMPRESSION: 1. Progressive cardiomegaly with new interstitial edema/infiltrates and worsening central pulmonary vascular congestion. Electronically Signed   By: Corlis Leak M.D.   On: 02/05/2017 09:25    Procedures Procedures (including critical care time)  Medications Ordered in ED Medications - No data to display   Initial  Impression / Assessment and Plan / ED Course  I have reviewed the triage vital signs and the nursing notes.  Pertinent labs & imaging results that were available during my care of the patient were reviewed by me and considered in my medical decision making (see chart for details).  Clinical Course as of Feb 06 1216  Sun Feb 05, 2017  1143 B Natriuretic Peptide: (!) 737.3 [AH]    Clinical Course User Index [AH] Tiburcio Pea, Remmie Bembenek, PA-C    10:59 AM  BP 128/65 (BP Location: Left Arm)   Pulse (!) 46   Temp 97.7 F (36.5 C) (Oral)   Resp 15   SpO2 96%    patient with worsening exertional dyspnea, progressive cardiomegaly on chest x-ray with pulmonary edema and infiltrates. 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 I suspect cardiomyopathy and new onset heart failure. I placed a consult call to cardiology. EKG is markedly abnormal.     Patient states that he needs to get home and take care his kids.  Date: 02/12/2017 Patient: Macgregor Aeschliman Puchalski Admitted: 02/05/2017  8:53 AM Attending Provider: No att. providers found  Roseanna Rainbow Fussner or his authorized caregiver has made the decision for the patient to leave the emergency department against the advice of No att. providers found.  He or his authorized caregiver has been informed and understands the inherent risks, including death.  He or his authorized caregiver has decided to accept the responsibility for this decision. Meredith Mells Flammia and all necessary parties have been advised that he may return for further evaluation or treatment. His condition at time of discharge was Stable.  Roseanna Rainbow Feld had current vital signs as follows:  Blood pressure 95/78, pulse 65, temperature 97.7 F (36.5 C), temperature source Oral, resp. rate 14, SpO2 93 %.   Roseanna Rainbow Honeycutt or his authorized caregiver has signed the Leaving Against Medical Advice form prior to leaving the  department.  Arthor Captain 02/12/2017    Final Clinical Impressions(s) / ED Diagnoses   Final diagnoses:  Acute congestive heart failure, unspecified heart failure type (HCC)  SOB (shortness of breath) on exertion    New Prescriptions New Prescriptions   No medications on file     Arthor Captain, PA-C 02/12/17 1552    Melene Plan, DO 02/16/17 [phone]

## 2017-02-05 NOTE — ED Notes (Signed)
Patient ambulated in hallway with a steady gait. O2 sats WDL at 99%

## 2017-02-05 NOTE — ED Triage Notes (Addendum)
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 Reports lately the smallest amount of activity makes him tired and short of breath. PT states he does smoke, and he feels like he just cant breath well or take a good breath. This comes and goes and is not constant. Pt reports he is not short of breath currently. He does feel tired though. Denies chest pain. PT states hx of open heart surgery, hx of murmur.   Hypotensive at triage 90s systolic, looked into chart and pt previous BPs have bene 140s systolic.

## 2017-02-05 NOTE — ED Notes (Signed)
Patient transported to X-ray 

## 2017-02-06 NOTE — ED Provider Notes (Signed)
Medical screening examination/treatment/procedure(s) were performed by non-physician practitioner and as supervising physician I was immediately available for consultation/collaboration.   EKG Interpretation  Date/Time:  Sunday February 05 2017 08:56:23 EDT Ventricular Rate:  69 PR Interval:    QRS Duration: 100 QT Interval:  516 QTC Calculation: 552 R Axis:   90 Text Interpretation:  Sinus bradycardia with marked sinus arrhythmia with Premature atrial complexes with Abberant conduction Rightward axis Left ventricular hypertrophy T wave abnormality, consider inferolateral ischemia Abnormal ECG No significant change since last tracing Confirmed by Undrea Archbold ([id number]) on 02/05/2017 9:00:07 AM       41  yo M with sob on exertion.  Going on for past couple of weeks.  Denies chest pain.  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  Does not see a doctor regularly.  On my exam with JVD to the jaw, rales up to the tip of the scapula bilaterally.  Trace peripheral edema.  CXR and labs concerning for new onset heart failure.  Discussed with cards.  Patient is a single parent of three small girls.  Is unable to arrange care for them.  Is unable to stay due to this.  Discussed with him at length that I do not know the extent of his heart failure, this could kill him or leave him disabled.  Patient was able to repeat this back to me, stated he would go home and settle his affairs and return.  Given cards follow up.  One dose of lasix.    Melene Plan, DO 02/16/17 [phone]

## 2017-08-14 ENCOUNTER — Other Ambulatory Visit: Payer: Self-pay

## 2017-08-14 ENCOUNTER — Encounter (HOSPITAL_COMMUNITY): Payer: Self-pay | Admitting: Emergency Medicine

## 2017-08-14 ENCOUNTER — Emergency Department (HOSPITAL_COMMUNITY): Payer: Medicaid Other

## 2017-08-14 ENCOUNTER — Inpatient Hospital Stay (HOSPITAL_COMMUNITY)
Admission: EM | Admit: 2017-08-14 | Discharge: 2017-08-21 | DRG: 291 | Disposition: A | Discharge status: Home / Self Care | Payer: Medicaid Other | Attending: Cardiology | Admitting: Cardiology

## 2017-08-14 DIAGNOSIS — I428 Other cardiomyopathies: Secondary | ICD-10-CM | POA: Diagnosis present

## 2017-08-14 DIAGNOSIS — Z23 Encounter for immunization: Secondary | ICD-10-CM | POA: Diagnosis not present

## 2017-08-14 DIAGNOSIS — R55 Syncope and collapse: Secondary | ICD-10-CM | POA: Diagnosis present

## 2017-08-14 DIAGNOSIS — I509 Heart failure, unspecified: Secondary | ICD-10-CM

## 2017-08-14 DIAGNOSIS — I5021 Acute systolic (congestive) heart failure: Secondary | ICD-10-CM | POA: Diagnosis present

## 2017-08-14 DIAGNOSIS — I4891 Unspecified atrial fibrillation: Secondary | ICD-10-CM

## 2017-08-14 DIAGNOSIS — F17210 Nicotine dependence, cigarettes, uncomplicated: Secondary | ICD-10-CM | POA: Diagnosis present

## 2017-08-14 DIAGNOSIS — I480 Paroxysmal atrial fibrillation: Secondary | ICD-10-CM | POA: Diagnosis present

## 2017-08-14 DIAGNOSIS — I110 Hypertensive heart disease with heart failure: Principal | ICD-10-CM | POA: Diagnosis present

## 2017-08-14 DIAGNOSIS — Z886 Allergy status to analgesic agent status: Secondary | ICD-10-CM

## 2017-08-14 LAB — I-STAT TROPONIN, ED
TROPONIN I, POC: 0.03 ng/mL (ref 0.00–0.08)
Troponin i, poc: 0.03 ng/mL (ref 0.00–0.08)

## 2017-08-14 LAB — BRAIN NATRIURETIC PEPTIDE: B NATRIURETIC PEPTIDE 5: 1173.2 pg/mL — AB (ref 0.0–100.0)

## 2017-08-14 LAB — CBC
HEMATOCRIT: 40.4 % (ref 39.0–52.0)
HEMOGLOBIN: 14.3 g/dL (ref 13.0–17.0)
MCH: 31.5 pg (ref 26.0–34.0)
MCHC: 35.4 g/dL (ref 30.0–36.0)
MCV: 89 fL (ref 78.0–100.0)
Platelets: 149 10*3/uL — ABNORMAL LOW (ref 150–400)
RBC: 4.54 MIL/uL (ref 4.22–5.81)
RDW: 12.1 % (ref 11.5–15.5)
WBC: 7 10*3/uL (ref 4.0–10.5)

## 2017-08-14 LAB — BASIC METABOLIC PANEL
ANION GAP: 5 (ref 5–15)
BUN: 11 mg/dL (ref 6–20)
CHLORIDE: 108 mmol/L (ref 101–111)
CO2: 23 mmol/L (ref 22–32)
Calcium: 8.6 mg/dL — ABNORMAL LOW (ref 8.9–10.3)
Creatinine, Ser: 0.76 mg/dL (ref 0.61–1.24)
GFR calc non Af Amer: >60 mL/min (ref >60)
Glucose, Bld: 76 mg/dL (ref 65–99)
POTASSIUM: 3.7 mmol/L (ref 3.5–5.1)
SODIUM: 136 mmol/L (ref 135–145)

## 2017-08-14 MED ORDER — RIVAROXABAN 20 MG PO TABS
20.0000 mg | ORAL_TABLET | Freq: Once | ORAL | Status: AC
Start: 2017-08-14 — End: 2017-08-14
  Administered 2017-08-14: 20 mg via ORAL
  Filled 2017-08-14 (×2): qty 1

## 2017-08-14 MED ORDER — SODIUM CHLORIDE 0.9 % IV SOLN: Freq: Once | INTRAVENOUS | Status: DC

## 2017-08-14 MED ORDER — SODIUM CHLORIDE 0.9 % IV BOLUS (SEPSIS): 500.0000 mL | Freq: Once | INTRAVENOUS | Status: DC

## 2017-08-14 MED ORDER — HEPARIN (PORCINE) IN NACL 100-0.45 UNIT/ML-% IJ SOLN
850.0000 [IU]/h | INTRAMUSCULAR | Status: DC
Start: 2017-08-15 — End: 2017-08-15
  Filled 2017-08-14: qty 250

## 2017-08-14 NOTE — ED Notes (Signed)
Pt placed on hospital bed from ED stretcher.  

## 2017-08-14 NOTE — ED Provider Notes (Signed)
[facility]-EMERGENCY DEPT Provider Note   CSN: [id number] Arrival date & time: 08/14/17  1122     History   Chief Complaint Chief Complaint  Patient presents with  . Near Syncope    HPI Gary [name] is a 42 y.o. male.  HPI   42 y/o male presents to the ED c/o sob that has been present for about 3 days. Symptoms are worse with exertion. States he has had some feeling of fast heart rate and lightheadedness when he is exerting himself as well, but none at rest.  He has to stop to catch his breath, and the symptoms resolve after a few minutes.  Denies chest pain. States he has intermittent cough from smoking cigarettes, but cough has not worsened recently. No wheezing or other URI sxs.  States he has had normal PO intake. Denies fevers, chills, syncope, headaches, dizziness, numbness/weakness to bilat arms and legs, rhinorrhea, congestion, fevers, abd pain, NVD, constipation, blood in stool, dark tarry stools, urinary issues. Denies swelling, pain, redness to the calves. No recent hospital admissions. No recent trauma or periods of immobility. Denies a h/o blood clot in him or his family. No h/o CA.  States he uses marijuiana, but no other drugs including no cocaine.   States he has been working back to back shifts recently without breaks and feels this may be contributing to his symptoms.  Past Medical History:  Diagnosis Date  . Murmur   . Smoker     Patient Active Problem List   Diagnosis Date Noted  . Acute congestive heart failure (HCC) 08/14/2017    Past Surgical History:  Procedure Laterality Date  . ABDOMINAL SURGERY    . CARDIAC SURGERY    . HERNIA REPAIR         Home Medications    Prior to Admission medications   Medication Sig Start Date End Date Taking? Authorizing Provider  Multiple Vitamins-Minerals (MULTIVITAMIN ADULT) TABS Take 1 tablet by mouth daily.   Yes [provider]  HYDROcodone-acetaminophen (NORCO/VICODIN)  5-325 MG per tablet Take 1-2 tablets by mouth every 6 hours as needed for pain. Patient not taking: Reported on 02/05/2017 02/09/14   Pisciotta, Joni Reining, PA-C  ibuprofen (ADVIL,MOTRIN) 600 MG tablet Take 1 tablet (600 mg total) by mouth every 8 (eight) hours as needed. Patient not taking: Reported on 02/05/2017 04/30/14   Azalia Bilis, MD    Family History History reviewed. No pertinent family history.  Social History Social History   Tobacco Use  . Smoking status: Current Every Day Smoker    Packs/day: 0.50  . Smokeless tobacco: Never Used  Substance Use Topics  . Alcohol use: Yes    Comment: social  . Drug use: Yes    Types: Marijuana     Allergies   Aspirin   Review of Systems Review of Systems  Constitutional: Negative for chills, diaphoresis and fever.  HENT: Negative for congestion, rhinorrhea and sore throat.   Eyes: Negative for visual disturbance.  Respiratory: Positive for cough (chronic) and shortness of breath. Negative for chest tightness and wheezing.   Cardiovascular: Positive for palpitations. Negative for chest pain and leg swelling.  Gastrointestinal: Negative for abdominal pain, blood in stool, constipation, diarrhea, nausea and vomiting.  Genitourinary: Negative for urgency.  Musculoskeletal: Negative for back pain.  Skin: Negative for rash.  Neurological: Positive for light-headedness. Negative for dizziness, syncope, numbness and headaches.     Physical Exam Updated Vital Signs BP 112/82   Pulse 100  Temp 97.8 F (36.6 C) (Oral)   Resp 16   Ht 5\' 6"  (1.676 m)   Wt 56.7 kg (125 lb)   SpO2 98%   BMI 20.18 kg/m   Physical Exam  Constitutional: He appears well-developed and well-nourished. No distress.  HENT:  Head: Normocephalic and atraumatic.  Right Ear: External ear normal.  Left Ear: External ear normal.  Nose: Nose normal.  Mucous membranes are dry  Eyes: Conjunctivae and EOM are normal. Pupils are equal, round, and reactive to  light.  Neck: Normal range of motion. Neck supple.  Cardiovascular: Normal rate and intact distal pulses.  Murmur heard. Irregularly irregular rhythm.  Pulmonary/Chest: Effort normal and breath sounds normal. No stridor. No respiratory distress. He has no wheezes. He has no rales.  Abdominal: Soft. Bowel sounds are normal. He exhibits no distension. There is no tenderness.  Musculoskeletal: He exhibits no edema.  No calf TTP, erythema, or redness  Neurological: He is alert.  Mental Status:  Alert, thought content appropriate, able to give a coherent history. Speech fluent without evidence of aphasia. Able to follow 2 step commands without difficulty.  Cranial Nerves:  II:  Peripheral visual fields grossly normal, pupils equal, round, reactive to light III,IV, VI: ptosis not present, extra-ocular motions intact bilaterally  V,VII: smile symmetric, facial light touch sensation equal VIII: hearing grossly normal to voice  X: uvula elevates symmetrically  XI: bilateral shoulder shrug symmetric and strong XII: midline tongue extension without fassiculations Motor:  Normal tone. 5/5 strength of BUE and BLE major muscle groups including strong and equal grip strength and dorsiflexion/plantar flexion Sensory: light touch normal in all extremities. CV: 2+ radial and DP/PT pulses  Skin: Skin is warm and dry.  Psychiatric: He has a normal mood and affect.  Nursing note and vitals reviewed.    ED Treatments / Results  Labs (all labs ordered are listed, but only abnormal results are displayed) Labs Reviewed  BASIC METABOLIC PANEL - Abnormal; Notable for the following components:      Result Value   Calcium 8.6 (*)    All other components within normal limits  CBC - Abnormal; Notable for the following components:   Platelets 149 (*)    All other components within normal limits  BRAIN NATRIURETIC PEPTIDE - Abnormal; Notable for the following components:   B Natriuretic Peptide 1,173.2 (*)     All other components within normal limits  APTT - Abnormal; Notable for the following components:   aPTT 46 (*)    All other components within normal limits  PROTIME-INR - Abnormal; Notable for the following components:   Prothrombin Time 22.9 (*)    All other components within normal limits  CBC - Abnormal; Notable for the following components:   HCT 37.7 (*)    Platelets 143 (*)    All other components within normal limits  BASIC METABOLIC PANEL - Abnormal; Notable for the following components:   Calcium 8.8 (*)    All other components within normal limits  TROPONIN I - Abnormal; Notable for the following components:   Troponin I 0.04 (*)    All other components within normal limits  MAGNESIUM  TSH  HEPARIN LEVEL (UNFRACTIONATED)  HIV ANTIBODY (ROUTINE TESTING)  TROPONIN I  TROPONIN I  I-STAT TROPONIN, ED  I-STAT TROPONIN, ED    EKG  EKG Interpretation  Date/Time:  Monday August 14 2017 11:32:46 EST Ventricular Rate:  88 PR Interval:    QRS Duration: 110 QT Interval:  439 QTC Calculation: 532 R Axis:   70 Text Interpretation:  Atrial fibrillation Left ventricular hypertrophy Abnormal T, consider ischemia, diffuse leads Minimal ST elevation, lateral leads Prolonged QT interval other than atrial fibrillation, no chage from previouis Confirmed by Arby Barrette [id number]) on 08/14/2017 6:54:54 PM       Radiology Dg Chest 2 View  Result Date: 08/14/2017 CLINICAL DATA:  Shortness of breath, intermittent chest pain. EXAM: CHEST  2 VIEW COMPARISON:  02/05/2017 FINDINGS: There is cardiomegaly. Chronic interstitial prominence within the lungs likely reflects scarring. No definite overt edema or effusions. No acute bony abnormality. IMPRESSION: Cardiomegaly. Chronic linear densities and interstitial prominence, likely scarring. Electronically Signed   By: Charlett Nose M.D.   On: 08/14/2017 12:10    Procedures Procedures (including critical care time)  Medications Ordered in  ED Medications  sodium chloride flush (NS) 0.9 % injection 3 mL (3 mLs Intravenous Given 08/15/17 0445)  sodium chloride flush (NS) 0.9 % injection 3 mL (not administered)  0.9 %  sodium chloride infusion (not administered)  acetaminophen (TYLENOL) tablet 650 mg (not administered)  ondansetron (ZOFRAN) injection 4 mg (not administered)  furosemide (LASIX) injection 40 mg (not administered)  potassium chloride SA (K-DUR,KLOR-CON) CR tablet 20 mEq (not administered)  losartan (COZAAR) tablet 25 mg (not administered)  carvedilol (COREG) tablet 3.125 mg (not administered)  heparin ADULT infusion 100 units/mL (25000 units/216mL sodium chloride 0.45%) (not administered)  rivaroxaban (XARELTO) tablet 20 mg (20 mg Oral Given 08/14/17 2040)     Initial Impression / Assessment and Plan / ED Course  I have reviewed the triage vital signs and the nursing notes.  Pertinent labs & imaging results that were available during my care of the patient were reviewed by me and considered in my medical decision making (see chart for details).  Discussed pt presentation and exam findings with Dr. Donnald Garre, who recommended consulting Dr. Sharyn Lull and giving a dose of xarelto.     715 rechecked patient.  His heart rate is 65.  He is in no acute distress right now and is stable.  Patient states that his cardiologist is Dr. Sharyn Lull.  States his last appointment was 1.5 months ago and he has an upcoming appointment this month.  Discussed patient case with Dr. Sharyn Lull.  It is unclear whether patient is willing to stay in the hospital at this point.  Dr. Laury Axon states that if the patient is admitted then he will consult on the patient, however the patient wants to leave AMA then he would like the patient to start on Xarelto 20 mg daily and have him follow-up in the office tomorrow.  Recheck patient. His vital signs are stable and he is in no acute distress. He is requesting food. Discussed the results of the workup thus far.  discussed the plan for admission.  Patient was initially hesitant to be admitted, however risks of going home were discussed and he does agree to stay. All Questions answered.  Discussed patient case with Dr. Sharyn Lull who will admit the patient to his service.  Rechecked pt and discussed plan for admission. Discussed all results as well. Pt agreeable to stay. HR 70s. BP 110s systolic. Has had normal HR throughout ED stay. SpO2 96% on RA. Mental status intact. Continues to deny chest pain. Pt agreeable to plan for admit and all questions answered.  Final Clinical Impressions(s) / ED Diagnoses   Final diagnoses:  Atrial fibrillation, unspecified type (HCC)  Acute congestive heart failure, unspecified heart failure type (  HCC)   42 y/o male with distant h/o open heart surgery as infant as well as recent CHF in 01/2017 presenting with DOE x3 days with associated lightheadedness and palpitations that resolve with rest. No associated chest pain. ECG revealed new onset atrial fibrillation. HR in 70s-80s and no indication for rate control at this time. Xarelto was initiated. ECG with cardiomegaly and and chronic linear densities and interstitial prominence suggesting scarring. CBC and BMP reassuring. Trop x2 negative. BNP elevated to 1173.  Pts CHADs2Vasc is 1. Dr. Sharyn Lull was consulted to admit patient for new onset afib and acute heart failure. He accepted the patient onto his service. Has placed orders and pt will be transferred to [facility]. Pt remains stable.  ED Discharge Orders    None       Rayne Du 08/15/17 6962    Arby Barrette, MD 08/15/17 [phone]

## 2017-08-14 NOTE — Progress Notes (Signed)
ANTICOAGULATION CONSULT NOTE - Initial Consult  Pharmacy Consult for IV heparin Indication: atrial fibrillation  Allergies  Allergen Reactions  . Aspirin     Internal bleeding     Patient Measurements: Height: 5\' 6"  (167.6 cm) Weight: 125 lb (56.7 kg) IBW/kg (Calculated) : 63.8 Heparin Dosing Weight: total body weight  Vital Signs: Temp: 97.8 F (36.6 C) (02/04 1134) Temp Source: Oral (02/04 1134) BP: 110/71 (02/04 2017) Pulse Rate: 64 (02/04 2017)  Labs: Recent Labs    08/14/17 1446  HGB 14.3  HCT 40.4  PLT 149*  CREATININE 0.76    Estimated Creatinine Clearance: 96.5 mL/min (by C-G formula based on SCr of 0.76 mg/dL).   Medical History: Past Medical History:  Diagnosis Date  . Murmur   . 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 Patient given one dose of Xarelto 20mg  PO in ED at 2040. Patient now being admitted and pharmacy consulted to dose heparin infusion. No anticoagulants PTA. CrCl ~ 97 ml/min. CBC reveals Hgb 14.3, Pltc 149K.   Goal of Therapy:  aPTT 66-102 seconds  Heparin level 0.3-0.7 units/mL Monitor platelets by anticoagulation protocol: Yes   Plan:   Heparin level, aPTT, PT/INR, and CBC in AM  Will delay heparin infusion to start ~24 hours after Xarelto dose.  Start heparin infusion at 850 units/hr on 08/15/17 at 2030.  Will f/u heparin level and aPTT in AM to determine which to use to adjust dosing (as Xarelto can falsely elevate heparin level).  Heparin level vs.aPTT 6 hours after heparin infusion started.  Daily CBC and heparin level  Monitor closely for s/sx of bleeding.   Greer Pickerel, PharmD, BCPS Pager: [phone] 08/14/2017 10:10 PM

## 2017-08-14 NOTE — ED Provider Notes (Signed)
Medical screening examination/treatment/procedure(s) were conducted as a shared visit with non-physician practitioner(s) and myself.  I personally evaluated the patient during the encounter.   EKG Interpretation  Date/Time:  Monday August 14 2017 11:32:46 EST Ventricular Rate:  88 PR Interval:    QRS Duration: 110 QT Interval:  439 QTC Calculation: 532 R Axis:   70 Text Interpretation:  Atrial fibrillation Left ventricular hypertrophy Abnormal T, consider ischemia, diffuse leads Minimal ST elevation, lateral leads Prolonged QT interval other than atrial fibrillation, no chage from previouis Confirmed by Arby Barrette [phone]) on 08/14/2017 6:54:54 PM     Patient reports he has been getting very short of breath with exertion and feeling his heart racing and skipping beats.  He denies he had chest pain.  Patient is alert and appropriate.  No respiratory distress at rest.  Monitor shows atrial fibrillation rate control.  Heart is irregularly irregular without gross rub murmur gallop.  Lungs are clear without wheeze rhonchi or rale.  No peripheral edema calves are soft and nontender.  All movements coordinated purposeful symmetric.  Patient has new onset symptomatic atrial fibrillation.  I agree with plan of management.   Arby Barrette, MD 08/14/17 2133

## 2017-08-14 NOTE — ED Triage Notes (Signed)
Pt reports near syncopal episodes,CP, and SOB since yesterday. Pt has hx of having to have fluid removed from his lungs 6 months ago, but none since then. Pt smokes cigarrettes.

## 2017-08-14 NOTE — H&P (Signed)
Gary [name] is an 42 y.o. male.   Chief Complaint: Progressive increasing shortness of breath associated with feeling tired fatigued and no energy HPI: Patient is 42 year old male with past medical history significant for questionable congenital heart disease status post open-heart surgery in [place] as a child and old records not available, hypertension, tobacco abuse, came to the ER complaining of progressive increasing shortness of breath associated with skipping of the heart beat and feeling tired fatigued and no energy. Patient denies any chest pain nausea vomiting diaphoresis. States has stopped all his medications. EKG done in the ED showed new onset A. fib with controlled ventricular response LVH with strain pattern, first set of cardiac enzymes were negative patient was noted to have elevated BNP of 1173.  Past Medical History:  Diagnosis Date  . Murmur   . Smoker     Past Surgical History:  Procedure Laterality Date  . ABDOMINAL SURGERY    . CARDIAC SURGERY    . HERNIA REPAIR      History reviewed. No pertinent family history. Social History:  reports that he has been smoking.  He has been smoking about 0.50 packs per day. he has never used smokeless tobacco. He reports that he drinks alcohol. He reports that he uses drugs. Drug: Marijuana.  Allergies:  Allergies  Allergen Reactions  . Aspirin     Internal bleeding      (Not in a hospital admission)  Results for orders placed or performed during the hospital encounter of 08/14/17 (from the past 48 hour(s))  Basic metabolic panel     Status: Abnormal   Collection Time: 08/14/17  2:46 PM  Result Value Ref Range   Sodium 136 135 - 145 mmol/L   Potassium 3.7 3.5 - 5.1 mmol/L   Chloride 108 101 - 111 mmol/L   CO2 23 22 - 32 mmol/L   Glucose, Bld 76 65 - 99 mg/dL   BUN 11 6 - 20 mg/dL   Creatinine, Ser 0.76 0.61 - 1.24 mg/dL   Calcium 8.6 (L) 8.9 - 10.3 mg/dL   GFR calc non Af Amer >60 >60 mL/min   GFR  calc Af Amer >60 >60 mL/min    Comment: (NOTE) The eGFR has been calculated using the CKD EPI equation. This calculation has not been validated in all clinical situations. eGFR's persistently <60 mL/min signify possible Chronic Kidney Disease.    Anion gap 5 5 - 15    Comment: Performed at [facility], [place] [address], [place], [place] [address]  CBC     Status: Abnormal   Collection Time: 08/14/17  2:46 PM  Result Value Ref Range   WBC 7.0 4.0 - 10.5 K/uL   RBC 4.54 4.22 - 5.81 MIL/uL   Hemoglobin 14.3 13.0 - 17.0 g/dL   HCT 40.4 39.0 - 52.0 %   MCV 89.0 78.0 - 100.0 fL   MCH 31.5 26.0 - 34.0 pg   MCHC 35.4 30.0 - 36.0 g/dL   RDW 12.1 11.5 - 15.5 %   Platelets 149 (L) 150 - 400 K/uL    Comment: Performed at [facility], [place] [address], [place], [place] [address]  I-stat troponin, ED     Status: None   Collection Time: 08/14/17  2:57 PM  Result Value Ref Range   Troponin i, poc 0.03 0.00 - 0.08 ng/mL   Comment 3            Comment: Due to the  release kinetics of cTnI, a negative result within the first hours of the onset of symptoms does not rule out myocardial infarction with certainty. If myocardial infarction is still suspected, repeat the test at appropriate intervals.   Brain natriuretic peptide     Status: Abnormal   Collection Time: 08/14/17  7:00 PM  Result Value Ref Range   B Natriuretic Peptide 1,173.2 (H) 0.0 - 100.0 pg/mL    Comment: Performed at [facility], [place] [address], [place], [place] [address]  I-Stat Troponin, ED (not at [facility])     Status: None   Collection Time: 08/14/17  7:11 PM  Result Value Ref Range   Troponin i, poc 0.03 0.00 - 0.08 ng/mL   Comment 3            Comment: Due to the release kinetics of cTnI, a negative result within the first hours of the onset of symptoms does not rule out myocardial infarction with certainty. If myocardial infarction is still suspected, repeat the test  at appropriate intervals.    Dg Chest 2 View  Result Date: 08/14/2017 CLINICAL DATA:  Shortness of breath, intermittent chest pain. EXAM: CHEST  2 VIEW COMPARISON:  02/05/2017 FINDINGS: There is cardiomegaly. Chronic interstitial prominence within the lungs likely reflects scarring. No definite overt edema or effusions. No acute bony abnormality. IMPRESSION: Cardiomegaly. Chronic linear densities and interstitial prominence, likely scarring. Electronically Signed   By: Rolm Baptise M.D.   On: 08/14/2017 12:10    Review of Systems  Constitutional: Positive for malaise/fatigue. Negative for chills and fever.  Eyes: Negative for blurred vision.  Respiratory: Positive for shortness of breath. Negative for cough and sputum production.   Cardiovascular: Negative for chest pain.  Gastrointestinal: Negative for abdominal pain, nausea and vomiting.  Genitourinary: Negative for dysuria.  Neurological: Positive for dizziness.    Blood pressure 110/71, pulse 64, temperature 97.8 F (36.6 C), temperature source Oral, resp. rate 20, height '5\' 6"'  (1.676 m), weight 56.7 kg (125 lb), SpO2 96 %. Physical Exam  Constitutional: He is oriented to person, place, and time.  HENT:  Head: Normocephalic and atraumatic.  Eyes: Conjunctivae are normal. Pupils are equal, round, and reactive to light. Left eye exhibits no discharge. No scleral icterus.  Neck: Normal range of motion. Neck supple. No JVD present. No tracheal deviation present. No thyromegaly present.  Cardiovascular:  Irregularly irregular S1 and S2 soft 3/6 systolic murmur with S3 gallop noted  Respiratory:  Faint bibasilar rales noted  GI: Soft. Bowel sounds are normal. He exhibits no distension. There is no tenderness. There is no rebound.  Musculoskeletal: He exhibits no edema, tenderness or deformity.  Neurological: He is alert and oriented to person, place, and time.     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 fib with controlled ventricular response Hypertension Tobacco abuse Plan As per orders Check old records Discussed with patient regarding various options of treatment i.e. chronic anticoagulation and rate control as patient has atrial fibrillation for last few days versus TEE assisted synchronized DC cardioversion, patient opted for rate control and chronic anticoagulation for now and possibly chemically cardioversion in the future. Charolette Forward, MD 08/14/2017, 9:29 PM

## 2017-08-15 ENCOUNTER — Inpatient Hospital Stay (HOSPITAL_COMMUNITY): Payer: Medicaid Other

## 2017-08-15 LAB — BASIC METABOLIC PANEL
Anion gap: 6 (ref 5–15)
BUN: 12 mg/dL (ref 6–20)
CALCIUM: 8.8 mg/dL — AB (ref 8.9–10.3)
CO2: 24 mmol/L (ref 22–32)
CREATININE: 0.8 mg/dL (ref 0.61–1.24)
Chloride: 107 mmol/L (ref 101–111)
GLUCOSE: 98 mg/dL (ref 65–99)
Potassium: 4.1 mmol/L (ref 3.5–5.1)
Sodium: 137 mmol/L (ref 135–145)

## 2017-08-15 LAB — CBC
HCT: 37.7 % — ABNORMAL LOW (ref 39.0–52.0)
Hemoglobin: 13.2 g/dL (ref 13.0–17.0)
MCH: 31.1 pg (ref 26.0–34.0)
MCHC: 35 g/dL (ref 30.0–36.0)
MCV: 88.7 fL (ref 78.0–100.0)
PLATELETS: 143 10*3/uL — AB (ref 150–400)
RBC: 4.25 MIL/uL (ref 4.22–5.81)
RDW: 12.1 % (ref 11.5–15.5)
WBC: 7.8 10*3/uL (ref 4.0–10.5)

## 2017-08-15 LAB — TSH: TSH: 1.014 u[IU]/mL (ref 0.350–4.500)

## 2017-08-15 LAB — PROTIME-INR
INR: 2.05
PROTHROMBIN TIME: 22.9 s — AB (ref 11.4–15.2)

## 2017-08-15 LAB — TROPONIN I
TROPONIN I: 0.04 ng/mL — AB (ref <0.03)
Troponin I: 0.04 ng/mL (ref <0.03)

## 2017-08-15 LAB — HIV ANTIBODY (ROUTINE TESTING W REFLEX): HIV SCREEN 4TH GENERATION: NONREACTIVE

## 2017-08-15 LAB — MAGNESIUM: Magnesium: 1.8 mg/dL (ref 1.7–2.4)

## 2017-08-15 LAB — HEPARIN LEVEL (UNFRACTIONATED)

## 2017-08-15 LAB — APTT
APTT: 47 s — AB (ref 24–36)
aPTT: 46 seconds — ABNORMAL HIGH (ref 24–36)

## 2017-08-15 MED ORDER — POTASSIUM CHLORIDE CRYS ER 20 MEQ PO TBCR
20.0000 meq | EXTENDED_RELEASE_TABLET | Freq: Every day | ORAL | Status: DC
  Administered 2017-08-15 – 2017-08-21 (×6): 20 meq via ORAL
  Filled 2017-08-15 (×6): qty 1

## 2017-08-15 MED ORDER — WARFARIN VIDEO: Freq: Once | Status: DC

## 2017-08-15 MED ORDER — FUROSEMIDE 10 MG/ML IJ SOLN
40.0000 mg | Freq: Every day | INTRAMUSCULAR | Status: DC
  Administered 2017-08-16 – 2017-08-21 (×5): 40 mg via INTRAVENOUS
  Filled 2017-08-15 (×6): qty 4

## 2017-08-15 MED ORDER — SODIUM CHLORIDE 0.9 % IV SOLN: 250.0000 mL | INTRAVENOUS | Status: DC | PRN

## 2017-08-15 MED ORDER — ONDANSETRON HCL 4 MG/2ML IJ SOLN: 4.0000 mg | Freq: Four times a day (QID) | INTRAMUSCULAR | Status: DC | PRN

## 2017-08-15 MED ORDER — SODIUM CHLORIDE 0.9% FLUSH
3.0000 mL | Freq: Two times a day (BID) | INTRAVENOUS | Status: DC
  Administered 2017-08-15 – 2017-08-21 (×10): 3 mL via INTRAVENOUS

## 2017-08-15 MED ORDER — PNEUMOCOCCAL VAC POLYVALENT 25 MCG/0.5ML IJ INJ
0.5000 mL | INJECTION | INTRAMUSCULAR | Status: DC
  Filled 2017-08-15: qty 0.5

## 2017-08-15 MED ORDER — ACETAMINOPHEN 325 MG PO TABS: 650.0000 mg | ORAL_TABLET | ORAL | Status: DC | PRN

## 2017-08-15 MED ORDER — HEPARIN (PORCINE) IN NACL 100-0.45 UNIT/ML-% IJ SOLN
1300.0000 [IU]/h | INTRAMUSCULAR | Status: DC
  Administered 2017-08-15: 850 [IU]/h via INTRAVENOUS
  Administered 2017-08-16: 1050 [IU]/h via INTRAVENOUS
  Administered 2017-08-17 – 2017-08-21 (×5): 1300 [IU]/h via INTRAVENOUS
  Filled 2017-08-15 (×8): qty 250

## 2017-08-15 MED ORDER — WARFARIN - PHARMACIST DOSING INPATIENT: Freq: Every day | Status: DC

## 2017-08-15 MED ORDER — LOSARTAN POTASSIUM 25 MG PO TABS
25.0000 mg | ORAL_TABLET | Freq: Every day | ORAL | Status: DC
  Administered 2017-08-15 – 2017-08-21 (×6): 25 mg via ORAL
  Filled 2017-08-15 (×6): qty 1

## 2017-08-15 MED ORDER — COUMADIN BOOK
Freq: Once | Status: DC
  Filled 2017-08-15 (×2): qty 1

## 2017-08-15 MED ORDER — INFLUENZA VAC SPLIT QUAD 0.5 ML IM SUSY
0.5000 mL | PREFILLED_SYRINGE | INTRAMUSCULAR | Status: AC
  Administered 2017-08-16: 0.5 mL via INTRAMUSCULAR
  Filled 2017-08-15: qty 0.5

## 2017-08-15 MED ORDER — CARVEDILOL 3.125 MG PO TABS
3.1250 mg | ORAL_TABLET | Freq: Two times a day (BID) | ORAL | Status: DC
  Administered 2017-08-15 – 2017-08-18 (×5): 3.125 mg via ORAL
  Filled 2017-08-15 (×7): qty 1

## 2017-08-15 MED ORDER — SODIUM CHLORIDE 0.9% FLUSH: 3.0000 mL | INTRAVENOUS | Status: DC | PRN

## 2017-08-15 MED ORDER — ENSURE ENLIVE PO LIQD
237.0000 mL | Freq: Two times a day (BID) | ORAL | Status: DC
  Administered 2017-08-16: 237 mL via ORAL

## 2017-08-15 MED ORDER — WARFARIN SODIUM 5 MG PO TABS
5.0000 mg | ORAL_TABLET | Freq: Once | ORAL | Status: AC
  Administered 2017-08-15: 5 mg via ORAL
  Filled 2017-08-15 (×3): qty 1

## 2017-08-15 NOTE — Progress Notes (Signed)
ANTICOAGULATION CONSULT NOTE - Follow Up  Pharmacy Consult for IV heparin start at 2030 on 2/5 Indication: atrial fibrillation  Allergies  Allergen Reactions  . Aspirin     Internal bleeding     Patient Measurements: Height: 5\' 6"  (167.6 cm) Weight: 125 lb (56.7 kg) IBW/kg (Calculated) : 63.8 Heparin Dosing Weight: total body weight  Vital Signs: BP: 112/73 (02/05 0700) Pulse Rate: 48 (02/05 0700)  Labs: Recent Labs    08/14/17 1446 08/15/17 0442  HGB 14.3 13.2  HCT 40.4 37.7*  PLT 149* 143*  APTT  --  46*  LABPROT  --  22.9*  INR  --  2.05  HEPARINUNFRC  --  >2.20*  CREATININE 0.76 0.80  TROPONINI  --  0.04*    Estimated Creatinine Clearance: 96.5 mL/min (by C-G formula based on SCr of 0.8 mg/dL).   Medical History: Past Medical History:  Diagnosis Date  . Murmur   . 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 Patient given one dose of Xarelto 20mg  PO in ED at 2040. Patient now being admitted and pharmacy consulted to dose heparin infusion. No anticoagulants PTA. CrCl ~ 97 ml/min. CBC reveals Hgb 14.3, Pltc 149K.   Today, 08/15/17  HL and INR both supratherapeutic as expected after Xarelto dose given 2030 on 2/4 last PM. This represents a false elevation due to Xarelto's effects as evidenced by slightly elevated aPTT  Plan will still be to start IV heparin this evening at 2030 without a bolus  Goal of Therapy:  aPTT 66-102 seconds  Heparin level 0.3-0.7 units/mL Monitor platelets by anticoagulation protocol: Yes   Plan:   Will delay heparin infusion to start ~24 hours after Xarelto dose, which was 2/4 ~2030  Start heparin infusion at 850 units/hr on 08/15/17 at 2030.  Heparin level vs.aPTT 6 hours after heparin infusion started.  Daily CBC  and heparin level  Monitor closely for s/sx of bleeding.   Hessie Knows, PharmD, BCPS 08/15/2017 7:26 AM

## 2017-08-15 NOTE — ED Notes (Signed)
ED TO INPATIENT HANDOFF REPORT  Name/Age/Gender Gary [name] 42 y.o. male  Code Status    Code Status Orders  (From admission, onward)        Start     Ordered   08/15/17 0341  Full code  Continuous     08/15/17 0340    Code Status History    Date Active Date Inactive Code Status Order ID Comments User Context   This patient has a current code status but no historical code status.      Home/SNF/Other Home  Chief Complaint Weakness  Level of Care/Admitting Diagnosis ED Disposition    ED Disposition Condition Comment   Admit  Hospital Area: [place] [[id number]]  Level of Care: Telemetry [5]  Diagnosis: Acute congestive heart failure [facility]) [[id number]]  Admitting Physician: Charolette Forward [[id number]]  Attending Physician: Charolette Forward [[id number]]  Estimated length of stay: past midnight tomorrow  Certification:: I certify this patient will need inpatient services for at least 2 midnights  PT Class (Do Not Modify): Inpatient [101]  PT Acc Code (Do Not Modify): Private [1]       Medical History Past Medical History:  Diagnosis Date  . Murmur   . Smoker     Allergies Allergies  Allergen Reactions  . Aspirin     Internal bleeding     IV Location/Drains/Wounds Patient Lines/Drains/Airways Status   Active Line/Drains/Airways    Name:   Placement date:   Placement time:   Site:   Days:   Peripheral IV 08/14/17 Right Forearm   08/14/17    1913    Forearm   1          Labs/Imaging Results for orders placed or performed during the hospital encounter of 08/14/17 (from the past 48 hour(s))  Basic metabolic panel     Status: Abnormal   Collection Time: 08/14/17  2:46 PM  Result Value Ref Range   Sodium 136 135 - 145 mmol/L   Potassium 3.7 3.5 - 5.1 mmol/L   Chloride 108 101 - 111 mmol/L   CO2 23 22 - 32 mmol/L   Glucose, Bld 76 65 - 99 mg/dL   BUN 11 6 - 20 mg/dL   Creatinine, Ser 0.76 0.61 - 1.24 mg/dL   Calcium 8.6 (L) 8.9 - 10.3 mg/dL    GFR calc non Af Amer >60 >60 mL/min   GFR calc Af Amer >60 >60 mL/min    Comment: (NOTE) The eGFR has been calculated using the CKD EPI equation. This calculation has not been validated in all clinical situations. eGFR's persistently <60 mL/min signify possible Chronic Kidney Disease.    Anion gap 5 5 - 15    Comment: Performed at [facility], [place] [address], [place], [place] [address]  CBC     Status: Abnormal   Collection Time: 08/14/17  2:46 PM  Result Value Ref Range   WBC 7.0 4.0 - 10.5 K/uL   RBC 4.54 4.22 - 5.81 MIL/uL   Hemoglobin 14.3 13.0 - 17.0 g/dL   HCT 40.4 39.0 - 52.0 %   MCV 89.0 78.0 - 100.0 fL   MCH 31.5 26.0 - 34.0 pg   MCHC 35.4 30.0 - 36.0 g/dL   RDW 12.1 11.5 - 15.5 %   Platelets 149 (L) 150 - 400 K/uL    Comment: Performed at [facility], [place] [address], [place], [place] [address]  I-stat troponin, ED     Status: None  Collection Time: 08/14/17  2:57 PM  Result Value Ref Range   Troponin i, poc 0.03 0.00 - 0.08 ng/mL   Comment 3            Comment: Due to the release kinetics of cTnI, a negative result within the first hours of the onset of symptoms does not rule out myocardial infarction with certainty. If myocardial infarction is still suspected, repeat the test at appropriate intervals.   Brain natriuretic peptide     Status: Abnormal   Collection Time: 08/14/17  7:00 PM  Result Value Ref Range   B Natriuretic Peptide 1,173.2 (H) 0.0 - 100.0 pg/mL    Comment: Performed at [facility], [address] [place], [place] [address]  I-Stat Troponin, ED (not at [facility])     Status: None   Collection Time: 08/14/17  7:11 PM  Result Value Ref Range   Troponin i, poc 0.03 0.00 - 0.08 ng/mL   Comment 3            Comment: Due to the release kinetics of cTnI, a negative result within the first hours of the onset of symptoms does not rule out myocardial infarction with certainty. If myocardial  infarction is still suspected, repeat the test at appropriate intervals.   Heparin level (unfractionated)     Status: Abnormal   Collection Time: 08/15/17  4:42 AM  Result Value Ref Range   Heparin Unfractionated >2.20 (H) 0.30 - 0.70 IU/mL    Comment: RESULTS CONFIRMED BY MANUAL DILUTION Performed at [facility], [address] [place], [place] [address]   APTT     Status: Abnormal   Collection Time: 08/15/17  4:42 AM  Result Value Ref Range   aPTT 46 (H) 24 - 36 seconds    Comment:        IF BASELINE aPTT IS ELEVATED, SUGGEST PATIENT RISK ASSESSMENT BE USED TO DETERMINE APPROPRIATE ANTICOAGULANT THERAPY. Performed at [facility], [address] [place], [place] [address]   Protime-INR     Status: Abnormal   Collection Time: 08/15/17  4:42 AM  Result Value Ref Range   Prothrombin Time 22.9 (H) 11.4 - 15.2 seconds   INR 2.05     Comment: Performed at [facility], [address] [place],  [address]  CBC     Status: Abnormal   Collection Time: 08/15/17  4:42 AM  Result Value Ref Range   WBC 7.8 4.0 - 10.5 K/uL   RBC 4.25 4.22 - 5.81 MIL/uL   Hemoglobin 13.2 13.0 - 17.0 g/dL   HCT 37.7 (L) 39.0 - 52.0 %   MCV 88.7 78.0 - 100.0 fL   MCH 31.1 26.0 - 34.0 pg   MCHC 35.0 30.0 - 36.0 g/dL   RDW 12.1 11.5 - 15.5 %   Platelets 143 (L) 150 - 400 K/uL    Comment: Performed at [facility], [address] [place],  [address]  Basic metabolic panel     Status: Abnormal   Collection Time: 08/15/17  4:42 AM  Result Value Ref Range   Sodium 137 135 - 145 mmol/L   Potassium 4.1 3.5 - 5.1 mmol/L   Chloride 107 101 - 111 mmol/L   CO2 24 22 - 32 mmol/L   Glucose, Bld 98 65 - 99 mg/dL   BUN 12 6 - 20 mg/dL   Creatinine, Ser 0.80 0.61 - 1.24 mg/dL   Calcium 8.8 (L) 8.9 -   10.3 mg/dL   GFR calc non Af Amer >60 >60 mL/min   GFR calc Af Amer >60 >60 mL/min    Comment: (NOTE) The eGFR has been  calculated using the CKD EPI equation. This calculation has not been validated in all clinical situations. eGFR's persistently <60 mL/min signify possible Chronic Kidney Disease.    Anion gap 6 5 - 15    Comment: Performed at [facility], [address] [place], [place] [address]  Magnesium     Status: None   Collection Time: 08/15/17  4:42 AM  Result Value Ref Range   Magnesium 1.8 1.7 - 2.4 mg/dL    Comment: Performed at [facility], [address] [place], [place] [address]  TSH     Status: None   Collection Time: 08/15/17  4:42 AM  Result Value Ref Range   TSH 1.014 0.350 - 4.500 uIU/mL    Comment: Performed by a 3rd Generation assay with a functional sensitivity of <=0.01 uIU/mL. Performed at [facility], [address] [place], [place] [address]   Troponin I     Status: Abnormal   Collection Time: 08/15/17  4:42 AM  Result Value Ref Range   Troponin I 0.04 (HH) <0.03 ng/mL    Comment: CRITICAL RESULT CALLED TO, READ BACK BY AND VERIFIED WITH: I COGGIN RN [id number] 02.05.19 A NAVARRO Performed at [facility], [address] [place], [place] [address]   Troponin I     Status: Abnormal   Collection Time: 08/15/17 10:15 AM  Result Value Ref Range   Troponin I 0.04 (HH) <0.03 ng/mL    Comment: CRITICAL VALUE NOTED.  VALUE IS CONSISTENT WITH PREVIOUSLY REPORTED AND CALLED VALUE. Performed at [facility], [address] [place], [place] [address]    Dg Chest 2 View  Result Date: 08/14/2017 CLINICAL DATA:  Shortness of breath, intermittent chest pain. EXAM: CHEST  2 VIEW COMPARISON:  02/05/2017 FINDINGS: There is cardiomegaly. Chronic interstitial prominence within the lungs likely reflects scarring. No definite overt edema or effusions. No acute bony abnormality. IMPRESSION: Cardiomegaly. Chronic linear densities and interstitial prominence, likely scarring. Electronically Signed    By: Kevin  Dover M.D.   On: 08/14/2017 12:10    Pending Labs Unresulted Labs (From admission, onward)   Start     Ordered   08/15/17 0500  Basic metabolic panel  Daily,   R     08/15/17 0340   08/15/17 0500  CBC  Daily,   R     08/14/17 2207   08/15/17 0341  HIV antibody (Routine Testing)  Once,   R     08/15/17 0340   08/15/17 0340  Troponin I  Now then every 6 hours,   R     08/15/17 0340      Vitals/Pain Today's Vitals   08/15/17 1022 08/15/17 1025 08/15/17 1114 08/15/17 1226  BP:    107/75  Pulse: (!) 150 86 86 (!) 58  Resp: (!) 21 17 20 17  Temp:      TempSrc:      SpO2: 99% 98% 97% 100%  Weight:      Height:      PainSc:        Isolation Precautions No active isolations  Medications Medications  sodium chloride flush (NS) 0.9 % injection 3 mL (3 mLs Intravenous Given 08/15/17 0908)  sodium chloride flush (NS) 0.9 % injection 3 mL (not administered)  0.9 %    sodium chloride infusion (not administered)  acetaminophen (TYLENOL) tablet 650 mg (not administered)  ondansetron (ZOFRAN) injection 4 mg (not administered)  furosemide (LASIX) injection 40 mg (0 mg Intravenous Hold 08/15/17 0910)  potassium chloride SA (K-DUR,KLOR-CON) CR tablet 20 mEq (20 mEq Oral Given 08/15/17 0906)  losartan (COZAAR) tablet 25 mg (25 mg Oral Given 08/15/17 0906)  carvedilol (COREG) tablet 3.125 mg (3.125 mg Oral Given 08/15/17 0907)  heparin ADULT infusion 100 units/mL (25000 units/250mL sodium chloride 0.45%) (not administered)  rivaroxaban (XARELTO) tablet 20 mg (20 mg Oral Given 08/14/17 2040)    Mobility walks 

## 2017-08-15 NOTE — Progress Notes (Signed)
ANTICOAGULATION CONSULT NOTE Pharmacy Consult for heparin Indication: atrial fibrillation  Allergies  Allergen Reactions  . Aspirin     Internal bleeding     Patient Measurements: Height: 5\' 5"  (165.1 cm) Weight: 112 lb 8 oz (51 kg) IBW/kg (Calculated) : 61.5 Heparin Dosing Weight: total body weight  Vital Signs: Temp: 98.3 F (36.8 C) (02/05 1950) Temp Source: Oral (02/05 1950) BP: 103/50 (02/05 1950) Pulse Rate: 73 (02/05 1950)  Labs: Recent Labs    08/14/17 1446 08/15/17 0442 08/15/17 1015 08/15/17 2115  HGB 14.3 13.2  --   --   HCT 40.4 37.7*  --   --   PLT 149* 143*  --   --   APTT  --  46*  --  47*  LABPROT  --  22.9*  --   --   INR  --  2.05  --   --   HEPARINUNFRC  --  >2.20*  --   --   CREATININE 0.76 0.80  --   --   TROPONINI  --  0.04* 0.04*  --     Estimated Creatinine Clearance: 86.8 mL/min (by C-G formula based on SCr of 0.8 mg/dL).  Assessment: 42 y.o. male with Afib for heparin  Goal of Therapy:  aPTT 66-102 seconds  Heparin level 0.3-0.7 units/mL  INR 2-3  Monitor platelets by anticoagulation protocol: Yes   Plan:  Increase Heparin 1050 units/hr Follow-up am labs.  Geannie Risen, PharmD, BCPS   08/15/2017 11:24 PM

## 2017-08-15 NOTE — Progress Notes (Signed)
ANTICOAGULATION CONSULT NOTE - Follow Up  Pharmacy Consult for IV heparin start at 2030 on 2/5, warfarin Indication: atrial fibrillation  Allergies  Allergen Reactions  . Aspirin     Internal bleeding     Patient Measurements: Height: 5\' 6"  (167.6 cm) Weight: 125 lb (56.7 kg) IBW/kg (Calculated) : 63.8 Heparin Dosing Weight: total body weight  Vital Signs: Temp: 99.1 F (37.3 C) (02/05 0905) Temp Source: Oral (02/05 0905) BP: 101/79 (02/05 1230) Pulse Rate: 55 (02/05 1230)  Labs: Recent Labs    08/14/17 1446 08/15/17 0442 08/15/17 1015  HGB 14.3 13.2  --   HCT 40.4 37.7*  --   PLT 149* 143*  --   APTT  --  46*  --   LABPROT  --  22.9*  --   INR  --  2.05  --   HEPARINUNFRC  --  >2.20*  --   CREATININE 0.76 0.80  --   TROPONINI  --  0.04* 0.04*    Estimated Creatinine Clearance: 96.5 mL/min (by C-G formula based on SCr of 0.8 mg/dL).   Medical History: Past Medical History:  Diagnosis Date  . Murmur   . 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 Patient given one dose of Xarelto 20mg  PO in ED at 2040. Patient now being admitted and pharmacy consulted to dose heparin infusion. No anticoagulants PTA.  2/4 Baseline labs INR = 2.05 Anti-Xa level > 2.20 APTT = 46 sec  Today, 08/15/17  HL and INR both supratherapeutic as expected after Xarelto dose given 2030 on 2/4 last PM. This represents a false elevation due to Xarelto's effects as evidenced by slightly elevated aPTT  Initial plan was to start IV heparin 2/5 at 2030 without a bolus. ED RN contacted pharmacy stating attending wanted heparin gtt to start earlier due to afib in setting of valvular disease and increased risk on stroke  rivaroxaban x 1 last night but now orders for pharmacy to  start warfarin for atrial fibrillation in setting of valvular heart dz  Goal of Therapy:  aPTT 66-102 seconds  Heparin level 0.3-0.7 units/mL  INR 2-3 (may change as get more information about previous surgical history)  Monitor platelets by anticoagulation protocol: Yes   Plan:   MD wants to start heparin now in setting of valvular heart disease.  Start heparin infusion at 850 units/hr (no Bolus)  If no plans for TEE/DCCV, consider change UFH to LMWH  Warfarin 5mg  PO x 1 tonight for decreased TBW  aPTT 6 hours after heparin infusion started.  Daily INR, CBC and heparin level  Monitor closely for s/sx of bleeding.  Warfarin education materials for 2/6  Provide warfarin education prior to discharge  Patient to tx to [facility]  Juliette Alcide, PharmD, BCPS.   Pager: [phone] 08/15/2017 1:59 PM

## 2017-08-15 NOTE — ED Notes (Signed)
Date and time results received: 08/15/17  (use smartphrase ".now" to insert current time)  Test: TROPONIN Critical Value: 0.04  Name of Provider Notified: HARWANI  Orders Received? Or Actions Taken?: Actions Taken: UPDATED WITH TROPONIN AND PT'S CURRENT STATUS, VITAL SIGNS, LASIX HELD, REMAINS HOLDING BED. NO ORDERS GIVEN

## 2017-08-15 NOTE — Progress Notes (Signed)
*  PRELIMINARY RESULTS* Echocardiogram 2D Echocardiogram has been performed.  Gary [name] 08/15/2017, 11:53 AM

## 2017-08-15 NOTE — ED Notes (Signed)
HARWANI MD Provider at bedside.

## 2017-08-15 NOTE — ED Notes (Signed)
Date and time results received: 08/15/17 5:46 AM (use smartphrase ".now" to insert current time)  Test: Troponin Critical Value: 0.04  Name of Provider Notified: Haiwani  Orders Received? Or Actions Taken?:

## 2017-08-15 NOTE — ED Notes (Addendum)
HARWANI ADMITTING MD PRESENT STATING HEPARIN INFUSION  NEEDS TO START. INFORMED DR. HARWANI THAT PT RECEIVED XARELTO AT 2040 AND HEPARIN WOULD START AT 2040 THIS EVENING PER PHARMACY. ADMITTING MD REQUESTING FOR INFUSION TO START NOW. THIS WRITER REQUESTING FOR ORDERS TO BE PLACED TO START HEPARIN. CALLED PHARMACY TO INQUIRE ABOUT HEPARIN INFUSION ORDERS. PER "LEANN" NO ORDER TO START HEPARIN INFUSION. AWAITING HEPARIN INFUSION ORDER TO START INFUSION.

## 2017-08-15 NOTE — Progress Notes (Signed)
Subjective:  Patient denies any chest pain.  States breathing has improved.  Denies any palpitations.  Objective:  Vital Signs in the last 24 hours: Temp:  [99.1 F (37.3 C)] 99.1 F (37.3 C) (02/05 0905) Pulse Rate:  [39-150] 55 (02/05 1230) Resp:  [13-28] 20 (02/05 1230) BP: (93-126)/(66-88) 101/79 (02/05 1230) SpO2:  [92 %-100 %] 97 % (02/05 1230)  Intake/Output from previous day: No intake/output data recorded. Intake/Output from this shift: Total I/O In: 503 [P.O.:500; I.V.:3] Out: 1200 [Urine:1200]  Physical Exam: Neck: no adenopathy, no carotid bruit and supple, symmetrical, trachea midline Lungs: decreased breath sounds at bases with faint rales noted Heart: irregularly irregular rhythm, S1, S2 normal and 3/6 systolic murmurand soft diastolic murmur noted Abdomen: soft, non-tender; bowel sounds normal; no masses,  no organomegaly Extremities: extremities normal, atraumatic, no cyanosis or edema  Lab Results: Recent Labs    08/14/17 1446 08/15/17 0442  WBC 7.0 7.8  HGB 14.3 13.2  PLT 149* 143*   Recent Labs    08/14/17 1446 08/15/17 0442  NA 136 137  K 3.7 4.1  CL 108 107  CO2 23 24  GLUCOSE 76 98  BUN 11 12  CREATININE 0.76 0.80   Recent Labs    08/15/17 0442 08/15/17 1015  TROPONINI 0.04* 0.04*   Hepatic Function Panel No results for input(s): PROT, ALBUMIN, AST, ALT, ALKPHOS, BILITOT, BILIDIR, IBILI in the last 72 hours. No results for input(s): CHOL in the last 72 hours. No results for input(s): PROTIME in the last 72 hours.  Imaging: Imaging results have been reviewed and Dg Chest 2 View  Result Date: 08/14/2017 CLINICAL DATA:  Shortness of breath, intermittent chest pain. EXAM: CHEST  2 VIEW COMPARISON:  02/05/2017 FINDINGS: There is cardiomegaly. Chronic interstitial prominence within the lungs likely reflects scarring. No definite overt edema or effusions. No acute bony abnormality. IMPRESSION: Cardiomegaly. Chronic linear densities and  interstitial prominence, likely scarring. Electronically Signed   By: Charlett Nose M.D.   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 fib with controlled ventricular response Hypertension Tobacco abuse Plan Start heparin and Coumadin per pharmacy protocol.  Patient not the candidate for NOVAC in view of valvular atrial fibrillation . Check 2-D echo.  Check LV systolic function/and systolic dimension.  Check for MR/AI  LOS: 1 day    Rinaldo Cloud 08/15/2017, 1:03 PM

## 2017-08-16 LAB — CBC
HCT: 40.8 % (ref 39.0–52.0)
Hemoglobin: 14.2 g/dL (ref 13.0–17.0)
MCH: 31.1 pg (ref 26.0–34.0)
MCHC: 34.8 g/dL (ref 30.0–36.0)
MCV: 89.3 fL (ref 78.0–100.0)
PLATELETS: 150 10*3/uL (ref 150–400)
RBC: 4.57 MIL/uL (ref 4.22–5.81)
RDW: 12.1 % (ref 11.5–15.5)
WBC: 10.5 10*3/uL (ref 4.0–10.5)

## 2017-08-16 LAB — ECHOCARDIOGRAM COMPLETE
Height: 66 in
Weight: 2000 oz

## 2017-08-16 LAB — BASIC METABOLIC PANEL
Anion gap: 10 (ref 5–15)
BUN: 13 mg/dL (ref 6–20)
CALCIUM: 8.8 mg/dL — AB (ref 8.9–10.3)
CHLORIDE: 106 mmol/L (ref 101–111)
CO2: 21 mmol/L — ABNORMAL LOW (ref 22–32)
CREATININE: 0.88 mg/dL (ref 0.61–1.24)
Glucose, Bld: 100 mg/dL — ABNORMAL HIGH (ref 65–99)
Potassium: 4.2 mmol/L (ref 3.5–5.1)
SODIUM: 137 mmol/L (ref 135–145)

## 2017-08-16 LAB — PROTIME-INR
INR: 1.18
PROTHROMBIN TIME: 14.9 s (ref 11.4–15.2)

## 2017-08-16 LAB — APTT: aPTT: 95 seconds — ABNORMAL HIGH (ref 24–36)

## 2017-08-16 LAB — HEPARIN LEVEL (UNFRACTIONATED): HEPARIN UNFRACTIONATED: 0.32 [IU]/mL (ref 0.30–0.70)

## 2017-08-16 MED ORDER — PNEUMOCOCCAL VAC POLYVALENT 25 MCG/0.5ML IJ INJ
0.5000 mL | INJECTION | INTRAMUSCULAR | Status: AC
  Administered 2017-08-17: 0.5 mL via INTRAMUSCULAR
  Filled 2017-08-16: qty 0.5

## 2017-08-16 MED ORDER — WARFARIN SODIUM 5 MG PO TABS
5.0000 mg | ORAL_TABLET | Freq: Once | ORAL | Status: AC
  Administered 2017-08-16: 5 mg via ORAL
  Filled 2017-08-16: qty 1

## 2017-08-16 MED ORDER — SODIUM CHLORIDE 0.9 % IV SOLN
INTRAVENOUS | Status: DC
  Administered 2017-08-16: 22:00:00 via INTRAVENOUS

## 2017-08-16 MED ORDER — ENSURE ENLIVE PO LIQD
237.0000 mL | Freq: Three times a day (TID) | ORAL | Status: DC
  Administered 2017-08-16 – 2017-08-21 (×8): 237 mL via ORAL

## 2017-08-16 NOTE — Plan of Care (Signed)
  Progressing Education: Knowledge of General Education information will improve 08/16/2017 0233 - Progressing by Elnita Maxwell, RN Clinical Measurements: Ability to maintain clinical measurements within normal limits will improve 08/16/2017 0233 - Progressing by Elnita Maxwell, RN Activity: Risk for activity intolerance will decrease 08/16/2017 0233 - Progressing by Elnita Maxwell, RN Coping: Level of anxiety will decrease 08/16/2017 0233 - Progressing by Elnita Maxwell, RN Safety: Ability to remain free from injury will improve 08/16/2017 0233 - Progressing by Elnita Maxwell, RN Skin Integrity: Risk for impaired skin integrity will decrease 08/16/2017 0233 - Progressing by Elnita Maxwell, RN

## 2017-08-16 NOTE — Discharge Instructions (Addendum)
Atrial Fibrillation Atrial fibrillation is a type of irregular or rapid heartbeat (arrhythmia). In atrial fibrillation, the heart quivers continuously in a chaotic pattern. This occurs when parts of the heart receive disorganized signals that make the heart unable to pump blood normally. This can increase the risk for stroke, heart failure, and other heart-related conditions. There are different types of atrial fibrillation, including:  Paroxysmal atrial fibrillation. This type starts suddenly, and it usually stops on its own shortly after it starts.  Persistent atrial fibrillation. This type often lasts longer than a week. It may stop on its own or with treatment.  Long-lasting persistent atrial fibrillation. This type lasts longer than 12 months.  Permanent atrial fibrillation. This type does not go away.  Talk with your health care provider to learn about the type of atrial fibrillation that you have. What are the causes? This condition is caused by some heart-related conditions or procedures, including:  A heart attack.  Coronary artery disease.  Heart failure.  Heart valve conditions.  High blood pressure.  Inflammation of the sac that surrounds the heart (pericarditis).  Heart surgery.  Certain heart rhythm disorders, such as Wolf-Parkinson-White syndrome.  Other causes include:  Pneumonia.  Obstructive sleep apnea.  Blockage of an artery in the lungs (pulmonary embolism, or PE).  Lung cancer.  Chronic lung disease.  Thyroid problems, especially if the thyroid is overactive (hyperthyroidism).  Caffeine.  Excessive alcohol use or illegal drug use.  Use of some medicines, including certain decongestants and diet pills.  Sometimes, the cause cannot be found. What increases the risk? This condition is more likely to develop in:  People who are older in age.  People who smoke.  People who have diabetes mellitus.  People who are overweight  (obese).  Athletes who exercise vigorously.  What are the signs or symptoms? Symptoms of this condition include:  A feeling that your heart is beating rapidly or irregularly.  A feeling of discomfort or pain in your chest.  Shortness of breath.  Sudden light-headedness or weakness.  Getting tired easily during exercise.  In some cases, there are no symptoms. How is this diagnosed? Your health care provider may be able to detect atrial fibrillation when taking your pulse. If detected, this condition may be diagnosed with:  An electrocardiogram (ECG).  A Holter monitor test that records your heartbeat patterns over a 24-hour period.  Transthoracic echocardiogram (TTE) to evaluate how blood flows through your heart.  Transesophageal echocardiogram (TEE) to view more detailed images of your heart.  A stress test.  Imaging tests, such as a CT scan or chest X-ray.  Blood tests.  How is this treated? The main goals of treatment are to prevent blood clots from forming and to keep your heart beating at a normal rate and rhythm. The type of treatment that you receive depends on many factors, such as your underlying medical conditions and how you feel when you are experiencing atrial fibrillation. This condition may be treated with:  Medicine to slow down the heart rate, bring the hearts rhythm back to normal, or prevent clots from forming.  Electrical cardioversion. This is a procedure that resets your hearts rhythm by delivering a controlled, low-energy shock to the heart through your skin.  Different types of ablation, such as catheter ablation, catheter ablation with pacemaker, or surgical ablation. These procedures destroy the heart tissues that send abnormal signals. When the pacemaker is used, it is placed under your skin to help your heart beat in  a regular rhythm.  Follow these instructions at home:  Take over-the counter and prescription medicines only as told by your  health care provider.  If your health care provider prescribed a blood-thinning medicine (anticoagulant), take it exactly as told. Taking too much blood-thinning medicine can cause bleeding. If you do not take enough blood-thinning medicine, you will not have the protection that you need against stroke and other problems.  Do not use tobacco products, including cigarettes, chewing tobacco, and e-cigarettes. If you need help quitting, ask your health care provider.  If you have obstructive sleep apnea, manage your condition as told by your health care provider.  Do not drink alcohol.  Do not drink beverages that contain caffeine, such as coffee, soda, and tea.  Maintain a healthy weight. Do not use diet pills unless your health care provider approves. Diet pills may make heart problems worse.  Follow diet instructions as told by your health care provider.  Exercise regularly as told by your health care provider.  Keep all follow-up visits as told by your health care provider. This is important. How is this prevented?  Avoid drinking beverages that contain caffeine or alcohol.  Avoid certain medicines, especially medicines that are used for breathing problems.  Avoid certain herbs and herbal medicines, such as those that contain ephedra or ginseng.  Do not use illegal drugs, such as cocaine and amphetamines.  Do not smoke.  Manage your high blood pressure. Contact a health care provider if:  You notice a change in the rate, rhythm, or strength of your heartbeat.  You are taking an anticoagulant and you notice increased bruising.  You tire more easily when you exercise or exert yourself. Get help right away if:  You have chest pain, abdominal pain, sweating, or weakness.  You feel nauseous.  You notice blood in your vomit, bowel movement, or urine.  You have shortness of breath.  You suddenly have swollen feet and ankles.  You feel dizzy.  You have sudden weakness or  numbness of the face, arm, or leg, especially on one side of the body.  You have trouble speaking, trouble understanding, or both (aphasia).  Your face or your eyelid droops on one side. These symptoms may represent a serious problem that is an emergency. Do not wait to see if the symptoms will go away. Get medical help right away. Call your local emergency services (911 in the [place] Do not drive yourself to the hospital. This information is not intended to replace advice given to you by your health care provider. Make sure you discuss any questions you have with your health care provider. Document Released: 06/27/2005 Document Revised: 11/04/2015 Document Reviewed: 10/22/2014 Elsevier Interactive Patient Education  2018 [employer].  Atrial Fibrillation Atrial fibrillation is a type of irregular or rapid heartbeat (arrhythmia). In atrial fibrillation, the heart quivers continuously in a chaotic pattern. This occurs when parts of the heart receive disorganized signals that make the heart unable to pump blood normally. This can increase the risk for stroke, heart failure, and other heart-related conditions. There are different types of atrial fibrillation, including:  Paroxysmal atrial fibrillation. This type starts suddenly, and it usually stops on its own shortly after it starts.  Persistent atrial fibrillation. This type often lasts longer than a week. It may stop on its own or with treatment.  Long-lasting persistent atrial fibrillation. This type lasts longer than 12 months.  Permanent atrial fibrillation. This type does not go away.  Talk with your  health care provider to learn about the type of atrial fibrillation that you have. What are the causes? This condition is caused by some heart-related conditions or procedures, including:  A heart attack.  Coronary artery disease.  Heart failure.  Heart valve conditions.  High blood pressure.  Inflammation of the sac that  surrounds the heart (pericarditis).  Heart surgery.  Certain heart rhythm disorders, such as Wolf-Parkinson-White syndrome.  Other causes include:  Pneumonia.  Obstructive sleep apnea.  Blockage of an artery in the lungs (pulmonary embolism, or PE).  Lung cancer.  Chronic lung disease.  Thyroid problems, especially if the thyroid is overactive (hyperthyroidism).  Caffeine.  Excessive alcohol use or illegal drug use.  Use of some medicines, including certain decongestants and diet pills.  Sometimes, the cause cannot be found. What increases the risk? This condition is more likely to develop in:  People who are older in age.  People who smoke.  People who have diabetes mellitus.  People who are overweight (obese).  Athletes who exercise vigorously.  What are the signs or symptoms? Symptoms of this condition include:  A feeling that your heart is beating rapidly or irregularly.  A feeling of discomfort or pain in your chest.  Shortness of breath.  Sudden light-headedness or weakness.  Getting tired easily during exercise.  In some cases, there are no symptoms. How is this diagnosed? Your health care provider may be able to detect atrial fibrillation when taking your pulse. If detected, this condition may be diagnosed with:  An electrocardiogram (ECG).  A Holter monitor test that records your heartbeat patterns over a 24-hour period.  Transthoracic echocardiogram (TTE) to evaluate how blood flows through your heart.  Transesophageal echocardiogram (TEE) to view more detailed images of your heart.  A stress test.  Imaging tests, such as a CT scan or chest X-ray.  Blood tests.  How is this treated? The main goals of treatment are to prevent blood clots from forming and to keep your heart beating at a normal rate and rhythm. The type of treatment that you receive depends on many factors, such as your underlying medical conditions and how you feel when  you are experiencing atrial fibrillation. This condition may be treated with:  Medicine to slow down the heart rate, bring the hearts rhythm back to normal, or prevent clots from forming.  Electrical cardioversion. This is a procedure that resets your hearts rhythm by delivering a controlled, low-energy shock to the heart through your skin.  Different types of ablation, such as catheter ablation, catheter ablation with pacemaker, or surgical ablation. These procedures destroy the heart tissues that send abnormal signals. When the pacemaker is used, it is placed under your skin to help your heart beat in a regular rhythm.  Follow these instructions at home:  Take over-the counter and prescription medicines only as told by your health care provider.  If your health care provider prescribed a blood-thinning medicine (anticoagulant), take it exactly as told. Taking too much blood-thinning medicine can cause bleeding. If you do not take enough blood-thinning medicine, you will not have the protection that you need against stroke and other problems.  Do not use tobacco products, including cigarettes, chewing tobacco, and e-cigarettes. If you need help quitting, ask your health care provider.  If you have obstructive sleep apnea, manage your condition as told by your health care provider.  Do not drink alcohol.  Do not drink beverages that contain caffeine, such as coffee, soda, and tea.  Maintain a healthy weight. Do not use diet pills unless your health care provider approves. Diet pills may make heart problems worse.  Follow diet instructions as told by your health care provider.  Exercise regularly as told by your health care provider.  Keep all follow-up visits as told by your health care provider. This is important. How is this prevented?  Avoid drinking beverages that contain caffeine or alcohol.  Avoid certain medicines, especially medicines that are used for breathing  problems.  Avoid certain herbs and herbal medicines, such as those that contain ephedra or ginseng.  Do not use illegal drugs, such as cocaine and amphetamines.  Do not smoke.  Manage your high blood pressure. Contact a health care provider if:  You notice a change in the rate, rhythm, or strength of your heartbeat.  You are taking an anticoagulant and you notice increased bruising.  You tire more easily when you exercise or exert yourself. Get help right away if:  You have chest pain, abdominal pain, sweating, or weakness.  You feel nauseous.  You notice blood in your vomit, bowel movement, or urine.  You have shortness of breath.  You suddenly have swollen feet and ankles.  You feel dizzy.  You have sudden weakness or numbness of the face, arm, or leg, especially on one side of the body.  You have trouble speaking, trouble understanding, or both (aphasia).  Your face or your eyelid droops on one side. These symptoms may represent a serious problem that is an emergency. Do not wait to see if the symptoms will go away. Get medical help right away. Call your local emergency services (911 in the [place] Do not drive yourself to the hospital. This information is not intended to replace advice given to you by your health care provider. Make sure you discuss any questions you have with your health care provider. Document Released: 06/27/2005 Document Revised: 11/04/2015 Document Reviewed: 10/22/2014 Elsevier Interactive Patient Education  [employer]. Information on my medicine - Coumadin   (Warfarin)  This medication education was reviewed with me or my healthcare representative as part of my discharge preparation.  The pharmacist that spoke with me during my hospital stay was:  Lennon Alstrom, [facility]  Why was Coumadin prescribed for you? Coumadin was prescribed for you because you have a blood clot or a medical condition that can cause an increased risk of forming blood  clots. Blood clots can cause serious health problems by blocking the flow of blood to the heart, lung, or brain. Coumadin can prevent harmful blood clots from forming. As a reminder your indication for Coumadin is:   Stroke Prevention Because Of Atrial Fibrillation  What test will check on my response to Coumadin? While on Coumadin (warfarin) you will need to have an INR test regularly to ensure that your dose is keeping you in the desired range. The INR (international normalized ratio) number is calculated from the result of the laboratory test called prothrombin time (PT).  If an INR APPOINTMENT HAS NOT ALREADY BEEN MADE FOR YOU please schedule an appointment to have this lab work done by your health care provider within 7 days. Your INR goal is usually a number between:  2 to 3 or your provider may give you a more narrow range like 2-2.5.  Ask your health care provider during an office visit what your goal INR is.  What  do you need to  know  About  COUMADIN? Take Coumadin (warfarin) exactly as  prescribed by your healthcare provider about the same time each day.  DO NOT stop taking without talking to the doctor who prescribed the medication.  Stopping without other blood clot prevention medication to take the place of Coumadin may increase your risk of developing a new clot or stroke.  Get refills before you run out.  What do you do if you miss a dose? If you miss a dose, take it as soon as you remember on the same day then continue your regularly scheduled regimen the next day.  Do not take two doses of Coumadin at the same time.  Important Safety Information A possible side effect of Coumadin (Warfarin) is an increased risk of bleeding. You should call your healthcare provider right away if you experience any of the following: ? Bleeding from an injury or your nose that does not stop. ? Unusual colored urine (red or dark brown) or unusual colored stools (red or black). ? Unusual bruising for  unknown reasons. ? A serious fall or if you hit your head (even if there is no bleeding).  Some foods or medicines interact with Coumadin (warfarin) and might alter your response to warfarin. To help avoid this: ? Eat a balanced diet, maintaining a consistent amount of Vitamin K. ? Notify your provider about major diet changes you plan to make. ? Avoid alcohol or limit your intake to 1 drink for women and 2 drinks for men per day. (1 drink is 5 oz. wine, 12 oz. beer, or 1.5 oz. liquor.)  Make sure that ANY health care provider who prescribes medication for you knows that you are taking Coumadin (warfarin).  Also make sure the healthcare provider who is monitoring your Coumadin knows when you have started a new medication including herbals and non-prescription products.  Coumadin (Warfarin)  Major Drug Interactions  Increased Warfarin Effect Decreased Warfarin Effect  Alcohol (large quantities) Antibiotics (esp. Septra/Bactrim, Flagyl, Cipro) Amiodarone (Cordarone) Aspirin (ASA) Cimetidine (Tagamet) Megestrol (Megace) NSAIDs (ibuprofen, naproxen, etc.) Piroxicam (Feldene) Propafenone (Rythmol SR) Propranolol (Inderal) Isoniazid (INH) Posaconazole (Noxafil) Barbiturates (Phenobarbital) Carbamazepine (Tegretol) Chlordiazepoxide (Librium) Cholestyramine (Questran) Griseofulvin Oral Contraceptives Rifampin Sucralfate (Carafate) Vitamin K   Coumadin (Warfarin) Major Herbal Interactions  Increased Warfarin Effect Decreased Warfarin Effect  Garlic Ginseng Ginkgo biloba Coenzyme Q10 Green tea St. Johns wort    Coumadin (Warfarin) FOOD Interactions  Eat a consistent number of servings per week of foods HIGH in Vitamin K (1 serving =  cup)  Collards (cooked, or boiled & drained) Kale (cooked, or boiled & drained) Mustard greens (cooked, or boiled & drained) Parsley *serving size only =  cup Spinach (cooked, or boiled & drained) Swiss chard (cooked, or boiled &  drained) Turnip greens (cooked, or boiled & drained)  Eat a consistent number of servings per week of foods MEDIUM-HIGH in Vitamin K (1 serving = 1 cup)  Asparagus (cooked, or boiled & drained) Broccoli (cooked, boiled & drained, or raw & chopped) Brussel sprouts (cooked, or boiled & drained) *serving size only =  cup Lettuce, raw (green leaf, endive, romaine) Spinach, raw Turnip greens, raw & chopped   These websites have more information on Coumadin (warfarin):  http://www.king-russell.com/; https://www.hines.net/;

## 2017-08-16 NOTE — Progress Notes (Signed)
Subjective:  Patient denies any chest pain.  States breathing is gradually improving.  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  Patient now agrees for transesophageal assisted synchronized DC cardioversion. Objective:  Vital Signs in the last 24 hours: Temp:  [97.5 F (36.4 C)-99 F (37.2 C)] 98.4 F (36.9 C) (02/06 0450) Pulse Rate:  [51-84] 57 (02/06 0450) Resp:  [17-21] 19 (02/05 1715) BP: (92-113)/(50-96) 110/75 (02/06 0450) SpO2:  [97 %-100 %] 100 % (02/06 0450) Weight:  [50.5 kg (111 lb 5.3 oz)-51 kg (112 lb 8 oz)] 50.5 kg (111 lb 5.3 oz) (02/06 0450)  Intake/Output from previous day: 02/05 0701 - 02/06 0700 In: 743 [P.O.:740; I.V.:3] Out: 1550 [Urine:1550] Intake/Output from this shift: Total I/O In: -  Out: 900 [Urine:900]  Physical Exam: Neck: no adenopathy, no carotid bruit, no JVD and supple, symmetrical, trachea midline Lungs: faint rails at the bases.  Air entry improved Heart: irregularly irregular rhythm, S1, S2 normal and 3/6 systolic and soft diastolic murmur noted Abdomen: soft, non-tender; bowel sounds normal; no masses,  no organomegaly Extremities: extremities normal, atraumatic, no cyanosis or edema  Lab Results: Recent Labs    08/15/17 0442 08/16/17 0635  WBC 7.8 10.5  HGB 13.2 14.2  PLT 143* 150   Recent Labs    08/15/17 0442 08/16/17 0635  NA 137 137  K 4.1 4.2  CL 107 106  CO2 24 21*  GLUCOSE 98 100*  BUN 12 13  CREATININE 0.80 0.88   Recent Labs    08/15/17 0442 08/15/17 1015  TROPONINI 0.04* 0.04*   Hepatic Function Panel No results for input(s): PROT, ALBUMIN, AST, ALT, ALKPHOS, BILITOT, BILIDIR, IBILI in the last 72 hours. No results for input(s): CHOL in the last 72 hours. No results for input(s): PROTIME in the last 72 hours.  Imaging: Imaging results have been reviewed and No results found.  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 fib with controlled ventricular response Hypertension Tobacco abuse Plan Continue present management. Will arrange for TEE cardioversion in a.m. Schedule for cardiac MRI. Continue heparin and Coumadin per pharmacy   LOS: 2 days    Rinaldo Cloud 08/16/2017, 1:09 PM

## 2017-08-16 NOTE — Plan of Care (Signed)
Pt. Ambulatory and independent in room. Pt. Told to call for assistance when needed. Call light placed within reach.

## 2017-08-16 NOTE — Progress Notes (Signed)
Patient declines further education on coumadin at this time. States pharmacist has discussed medication with him; "let's hold off on the video". Patient appears to be stressed about taking a medication "for life" and new diagnosis of afib.

## 2017-08-16 NOTE — Progress Notes (Signed)
ANTICOAGULATION CONSULT NOTE - Follow Up  Pharmacy Consult:  Heparin / Coumadin Indication: atrial fibrillation  Allergies  Allergen Reactions  . Aspirin     Internal bleeding     Patient Measurements: Height: 5\' 5"  (165.1 cm) Weight: 111 lb 5.3 oz (50.5 kg) IBW/kg (Calculated) : 61.5 Heparin Dosing Weight: 50 kg  Vital Signs: Temp: 98.4 F (36.9 C) (02/06 0450) Temp Source: Oral (02/06 0450) BP: 110/75 (02/06 0450) Pulse Rate: 57 (02/06 0450)  Labs: Recent Labs    08/14/17 1446 08/15/17 0442 08/15/17 1015 08/15/17 2115 08/16/17 0635  HGB 14.3 13.2  --   --  14.2  HCT 40.4 37.7*  --   --  40.8  PLT 149* 143*  --   --  150  APTT  --  46*  --  47* 95*  LABPROT  --  22.9*  --   --  14.9  INR  --  2.05  --   --  1.18  HEPARINUNFRC  --  >2.20*  --   --  0.32  CREATININE 0.76 0.80  --   --  0.88  TROPONINI  --  0.04* 0.04*  --   --     Estimated Creatinine Clearance: 78.1 mL/min (by C-G formula based on SCr of 0.88 mg/dL).     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  Found to have new-onset Afib and given one dose of Xarelto in the ED.  Patient was then transitioned to IV heparin bridge to Coumadin.  Heparin level and aPTT are therapeutic, starting to correlate.  INR elevated initially due to the effect of Xarelto, now down to sub-therapeutic level as expected.  No bleeding reported.   Goal of Therapy:  aPTT 66-102 seconds  Heparin level 0.3-0.7 units/mL INR 2 - 3 (pending PMH record) Monitor platelets by anticoagulation protocol: Yes     Plan:  Continue heparin gtt at 1050 units/hr Repeat Coumadin 5mg  PO today Daily heparin level, CBC, PT / INR Daily aPTT x 1 more day F/u ECHO, PMH record (?valvular heart disease)   Duffy Dantonio D. Laney Potash, PharmD, BCPS Pager:  [phone] 08/16/2017, 10:10 AM

## 2017-08-16 NOTE — Progress Notes (Signed)
Initial Nutrition Assessment  DOCUMENTATION CODES:   Underweight, Severe malnutrition in context of chronic illness  INTERVENTION:   -Ensure Enlive po TID, each supplement provides 350 kcal and 20 grams of protein  NUTRITION DIAGNOSIS:   Severe Malnutrition related to chronic illness(CHF) as evidenced by moderate fat depletion, severe fat depletion, moderate muscle depletion, severe muscle depletion, energy intake < 75% for > or equal to 1 month.  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   Spoke with pt, who reports minimal intake over the past few months. Pt expressed being stressed over illness, work, and caring for his 4 daughters as a single parent. He has minimal support other than his sister. He reports he eats approximately 2 times per day, but "usually nibbles". He shares multiple instances of dietary indiscretion, enjoying foods such as candy bars, potato chips, and "general junk food". Pt reports his tobacco habit has increased recently and feels smoking often suppresses his appetite; he also has been losing sleep due to stress.   Pt reports minimal intake during hospitalization; pt consumed only bites of his spaghetti meal. He shares he consumed an Ensure supplement earlier today, which he enjoyed.   Pt reports UBW is around 125#. He  noticed that he was starting to lose weight as his clothes have been very baggy. He denies any increase in physical activity; he is generally very active and works two jobs (Database administrator).   Discussed with pt importance of good nutritional intake to promote healing. Discussed consuming small, frequent meals due to early satiety and pt lifestyle. Pt also amenable to supplements; discussed similar supplements to Ensure Enlive that pt could purchase outside of the hospital.   Labs reviewed.   NUTRITION - FOCUSED PHYSICAL EXAM:    Most Recent Value  Orbital Region  Moderate depletion  Upper Arm Region  Severe depletion  Thoracic and Lumbar Region  Severe depletion  Buccal Region  Moderate depletion  Temple Region  Moderate depletion  Clavicle Bone Region  Severe depletion  Clavicle and Acromion Bone Region  Severe depletion  Scapular Bone Region  Severe depletion  Dorsal Hand  Moderate depletion  Patellar Region  Severe depletion  Anterior Thigh Region  Severe depletion  Posterior Calf Region  Severe depletion  Edema (RD Assessment)  None  Hair  Reviewed  Eyes  Reviewed  Mouth  Reviewed  Skin  Reviewed  Nails  Reviewed       Diet Order:  Diet Heart Room service appropriate? Yes; Fluid consistency: Thin  EDUCATION NEEDS:   Education needs have been addressed  Skin:  Skin Assessment: Reviewed RN Assessment  Last BM:  08/14/17  Height:   Ht Readings from Last 1 Encounters:  08/15/17 5\' 5"  (1.651 m)    Weight:   Wt Readings from Last 1 Encounters:  08/16/17 111 lb 5.3 oz (50.5 kg)  Ideal Body Weight:  61.8 kg  BMI:  Body mass index is 18.53 kg/m.  Estimated Nutritional Needs:   Kcal:  1800-2000  Protein:  100-115 grams  Fluid:  1.8-2.0 L    Ovide Dusek A. Mayford Knife, RD, LDN, CDE Pager: [phone] After hours Pager: [phone]

## 2017-08-17 ENCOUNTER — Inpatient Hospital Stay (HOSPITAL_COMMUNITY): Payer: Medicaid Other

## 2017-08-17 ENCOUNTER — Inpatient Hospital Stay (HOSPITAL_COMMUNITY): Payer: Medicaid Other | Admitting: Certified Registered Nurse Anesthetist

## 2017-08-17 ENCOUNTER — Encounter (HOSPITAL_COMMUNITY): Payer: Self-pay | Admitting: *Deleted

## 2017-08-17 ENCOUNTER — Encounter (HOSPITAL_COMMUNITY)
Admission: EM | Disposition: A | Discharge status: Home / Self Care | Payer: Self-pay | Source: Home / Self Care | Attending: Cardiology

## 2017-08-17 DIAGNOSIS — I428 Other cardiomyopathies: Secondary | ICD-10-CM

## 2017-08-17 HISTORY — PX: TEE WITHOUT CARDIOVERSION: SHX5443

## 2017-08-17 HISTORY — PX: CARDIOVERSION: SHX1299

## 2017-08-17 LAB — CBC
HEMATOCRIT: 44.5 % (ref 39.0–52.0)
Hemoglobin: 15.3 g/dL (ref 13.0–17.0)
MCH: 30.9 pg (ref 26.0–34.0)
MCHC: 34.4 g/dL (ref 30.0–36.0)
MCV: 89.9 fL (ref 78.0–100.0)
Platelets: 174 10*3/uL (ref 150–400)
RBC: 4.95 MIL/uL (ref 4.22–5.81)
RDW: 12.1 % (ref 11.5–15.5)
WBC: 10.7 10*3/uL — ABNORMAL HIGH (ref 4.0–10.5)

## 2017-08-17 LAB — BASIC METABOLIC PANEL
Anion gap: 13 (ref 5–15)
BUN: 17 mg/dL (ref 6–20)
CALCIUM: 9.2 mg/dL (ref 8.9–10.3)
CO2: 22 mmol/L (ref 22–32)
CREATININE: 0.86 mg/dL (ref 0.61–1.24)
Chloride: 100 mmol/L — ABNORMAL LOW (ref 101–111)
GFR calc non Af Amer: >60 mL/min (ref >60)
Glucose, Bld: 91 mg/dL (ref 65–99)
Potassium: 4.5 mmol/L (ref 3.5–5.1)
SODIUM: 135 mmol/L (ref 135–145)

## 2017-08-17 LAB — APTT: aPTT: 116 seconds — ABNORMAL HIGH (ref 24–36)

## 2017-08-17 LAB — HEPARIN LEVEL (UNFRACTIONATED)
HEPARIN UNFRACTIONATED: 0.29 [IU]/mL — AB (ref 0.30–0.70)
Heparin Unfractionated: 0.28 IU/mL — ABNORMAL LOW (ref 0.30–0.70)

## 2017-08-17 LAB — PROTIME-INR
INR: 1.2
PROTHROMBIN TIME: 15.1 s (ref 11.4–15.2)

## 2017-08-17 SURGERY — ECHOCARDIOGRAM, TRANSESOPHAGEAL
Anesthesia: General

## 2017-08-17 MED ORDER — WARFARIN SODIUM 5 MG PO TABS
5.0000 mg | ORAL_TABLET | Freq: Once | ORAL | Status: AC
  Administered 2017-08-17: 5 mg via ORAL
  Filled 2017-08-17: qty 1

## 2017-08-17 MED ORDER — BUTAMBEN-TETRACAINE-BENZOCAINE 2-2-14 % EX AERO
INHALATION_SPRAY | CUTANEOUS | Status: DC | PRN
  Administered 2017-08-17: 2 via TOPICAL

## 2017-08-17 MED ORDER — PROPOFOL 10 MG/ML IV BOLUS
INTRAVENOUS | Status: DC | PRN
  Administered 2017-08-17 (×2): 15 mg via INTRAVENOUS
  Administered 2017-08-17: 10 mg via INTRAVENOUS

## 2017-08-17 MED ORDER — ONDANSETRON HCL 4 MG/2ML IJ SOLN
INTRAMUSCULAR | Status: DC | PRN
  Administered 2017-08-17: 4 mg via INTRAVENOUS

## 2017-08-17 MED ORDER — PROPOFOL 500 MG/50ML IV EMUL
INTRAVENOUS | Status: DC | PRN
  Administered 2017-08-17: 80 ug/kg/min via INTRAVENOUS
  Administered 2017-08-17: 120 ug/kg/min via INTRAVENOUS

## 2017-08-17 MED ORDER — GADOBENATE DIMEGLUMINE 529 MG/ML IV SOLN
20.0000 mL | Freq: Once | INTRAVENOUS | Status: AC
  Administered 2017-08-17: 20 mL via INTRAVENOUS

## 2017-08-17 MED ORDER — MIDAZOLAM HCL 5 MG/5ML IJ SOLN
INTRAMUSCULAR | Status: DC | PRN
  Administered 2017-08-17: 2 mg via INTRAVENOUS

## 2017-08-17 MED ORDER — SODIUM CHLORIDE 0.9 % IV SOLN
INTRAVENOUS | Status: DC | PRN
  Administered 2017-08-17: 11:00:00 via INTRAVENOUS

## 2017-08-17 MED ORDER — PHENYLEPHRINE HCL 10 MG/ML IJ SOLN
INTRAMUSCULAR | Status: DC | PRN
  Administered 2017-08-17: 80 ug via INTRAVENOUS
  Administered 2017-08-17: 120 ug via INTRAVENOUS
  Administered 2017-08-17: 40 ug via INTRAVENOUS
  Administered 2017-08-17: 80 ug via INTRAVENOUS

## 2017-08-17 NOTE — Progress Notes (Signed)
ANTICOAGULATION CONSULT NOTE  Pharmacy Consult:  Heparin / Coumadin Indication: atrial fibrillation  Allergies  Allergen Reactions  . Aspirin     Internal bleeding     Patient Measurements: Height: 5\' 5"  (165.1 cm) Weight: 112 lb 1.6 oz (50.8 kg) IBW/kg (Calculated) : 61.5 Heparin Dosing Weight: 50 kg  Vital Signs: Temp: 97.4 F (36.3 C) (02/07 1202) Temp Source: Oral (02/07 1202) BP: 102/55 (02/07 1202) Pulse Rate: 64 (02/07 1202)  Labs: Recent Labs    08/15/17 0442 08/15/17 1015 08/15/17 2115 08/16/17 0635 08/17/17 0450  HGB 13.2  --   --  14.2 15.3  HCT 37.7*  --   --  40.8 44.5  PLT 143*  --   --  150 174  APTT 46*  --  47* 95* 116*  LABPROT 22.9*  --   --  14.9 15.1  INR 2.05  --   --  1.18 1.20  HEPARINUNFRC >2.20*  --   --  0.32 0.28*  CREATININE 0.80  --   --  0.88 0.86  TROPONINI 0.04* 0.04*  --   --   --      Assessment: 35 YOM with PMH of 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  Found to have new-onset Afib and given one dose of Xarelto in the ED.  Patient was then transitioned to IV heparin bridge to Coumadin.  Heparin was held this morning for MRI and resumed around 0945.  INR remains sub-therapeutic as expected.  No bleeding reported.   Goal of Therapy:  Heparin level 0.3-0.7 units/mL INR 2 - 3 Monitor platelets by anticoagulation protocol: Yes     Plan:  Continue heparin gtt at 1200 units/hr Check 6 hr heparin level Repeat Coumadin 5mg  PO today - increase dose tomorrow if INR doesn't increase meaningfully  Daily heparin level, CBC, PT / INR F/u MRI   Dresden Lozito D. Laney Potash, PharmD, BCPS Pager:  [phone] 08/17/2017, 12:19 PM

## 2017-08-17 NOTE — Progress Notes (Addendum)
  Echocardiogram Echocardiogram Transesophageal has been performed.  Gary [name] [name] 08/17/2017, 11:37 AM

## 2017-08-17 NOTE — CV Procedure (Signed)
INDICATIONS:   The patient is 42 year old male with new onset atrial fibrillation.  PROCEDURE:  Informed consent was discussed including risks, benefits and alternatives for the procedure.  Risks include, but are not limited to, cough, sore throat, vomiting, nausea, somnolence, esophageal and stomach trauma or perforation, bleeding, low blood pressure, aspiration, pneumonia, infection, trauma to the teeth and death.    Patient was given sedation.  The oropharynx was anesthetized with topical lidocaine.  The transesophageal probe was inserted in the esophagus and stomach and multiple views were obtained.  Agitated saline was used after the transesophageal probe was removed from the body.  The patient was kept under observation until the patient left the procedure room.  The patient left the procedure room in stable condition.   COMPLICATIONS:  There were no immediate complications except small bleed from biting his tongue.  FINDINGS:  1. LEFT VENTRICLE: The left ventricle has increased trabeculae and mild generalized hypokinesia with EF 40-45 %.  No thrombus or masses seen in the left ventricle.  2. RIGHT VENTRICLE:  The right ventricle also has increased trabeculae and normal function without any thrombus or masses.    3. LEFT ATRIUM:  The left atrium is enlarged without any thrombus or masses.  4. LEFT ATRIAL APPENDAGE:  The left atrial appendage large and is free of any thrombus or masses.  5. RIGHT ATRIUM:  The right atrium is mildly dilated and is free of any thrombus or masses.    6. ATRIAL SEPTUM:  The atrial septum is normal without any ASD or PFO. Bubble study was negative.  7. MITRAL VALVE:  The mitral valve is normal in structure and function with multiple jets of moderate regurgitation, no masses, stenosis or vegetations.  8. TRICUSPID VALVE:  The tricuspid valve is normal in structure and function with multiple jets of moderate regurgitation, no masses, stenosis or  vegetations.  9. AORTIC VALVE:  The aortic valve is normal in structure and function with trivial regurgitation, no masses, stenosis or vegetations.  10. PULMONIC VALVE:  The pulmonic valve is normal in structure and function with small regurgitation, no masses, stenosis or vegetations.  11. AORTIC ARCH, ASCENDING AND DESCENDING AORTA:  The aorta had mild atherosclerosis in the ascending and descending aorta.  The aortic arch was normal.  12.  Superior Vena Cava : No thrombus or catheter.  13.  Pulmonary Veins: Visible.  14.  Pulmonary artery: visible and normal.   IMPRESSION:   1. 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 2. Trace AI, mild PI, moderate MR and TR. 3. No PFO or thrombus..  RECOMMENDATIONS:    Exeternal cardioversion, successful.

## 2017-08-17 NOTE — Anesthesia Preprocedure Evaluation (Signed)
Anesthesia Evaluation  Patient identified by MRN, date of birth, ID band Patient awake    Reviewed: Allergy & Precautions, NPO status , Patient's Chart, lab work & pertinent test results  Airway Mallampati: II  TM Distance: >3 FB     Dental   Pulmonary neg pulmonary ROS, Current Smoker,    breath sounds clear to auscultation       Cardiovascular +CHF   Rhythm:Regular Rate:Normal     Neuro/Psych    GI/Hepatic negative GI ROS, Neg liver ROS,   Endo/Other  negative endocrine ROS  Renal/GU negative Renal ROS     Musculoskeletal   Abdominal   Peds  Hematology   Anesthesia Other Findings   Reproductive/Obstetrics                             Anesthesia Physical Anesthesia Plan  ASA: III  Anesthesia Plan: General   Post-op Pain Management:    Induction: Intravenous  PONV Risk Score and Plan: 1 and Treatment may vary due to age or medical condition  Airway Management Planned: Nasal Cannula and Simple Face Mask  Additional Equipment:   Intra-op Plan:   Post-operative Plan:   Informed Consent: I have reviewed the patients History and Physical, chart, labs and discussed the procedure including the risks, benefits and alternatives for the proposed anesthesia with the patient or authorized representative who has indicated his/her understanding and acceptance.   Dental advisory given  Plan Discussed with: CRNA and Anesthesiologist  Anesthesia Plan Comments:         Anesthesia Quick Evaluation

## 2017-08-17 NOTE — Progress Notes (Signed)
ANTICOAGULATION CONSULT NOTE  Pharmacy Consult:  Heparin Indication: atrial fibrillation  Allergies  Allergen Reactions  . Aspirin     Internal bleeding     Patient Measurements: Height: 5\' 5"  (165.1 cm) Weight: 112 lb 1.6 oz (50.8 kg) IBW/kg (Calculated) : 61.5 Heparin Dosing Weight: 50 kg  Vital Signs: Temp: 98.6 F (37 C) (02/07 0435) Temp Source: Oral (02/07 0435) BP: 112/55 (02/07 0435) Pulse Rate: 57 (02/07 0435)  Labs: Recent Labs    08/14/17 1446 08/15/17 0442 08/15/17 1015 08/15/17 2115 08/16/17 0635 08/17/17 0450  HGB 14.3 13.2  --   --  14.2  --   HCT 40.4 37.7*  --   --  40.8  --   PLT 149* 143*  --   --  150  --   APTT  --  46*  --  47* 95*  --   LABPROT  --  22.9*  --   --  14.9  --   INR  --  2.05  --   --  1.18  --   HEPARINUNFRC  --  >2.20*  --   --  0.32 0.28*  CREATININE 0.76 0.80  --   --  0.88 0.86  TROPONINI  --  0.04* 0.04*  --   --   --      Assessment: 30 YOM with PMH of 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  Found to have new-onset Afib and given one dose of Xarelto in the ED.  Patient was then transitioned to IV heparin bridge to Coumadin.  HL now subtherapeutic   Goal of Therapy:  Heparin level 0.3-0.7 units/mL INR 2 - 3 (pending PMH record) Monitor platelets by anticoagulation protocol: Yes     Plan:  -Increase heparin to  -Daily HL, CBC -Check confirmatory   Baldemar Friday 08/17/2017 7:04 AM

## 2017-08-17 NOTE — Progress Notes (Signed)
Ref: Patient, No Pcp Per   Subjective:  Here for TEE. Post TEE external cardioversion.  Objective:  Vital Signs in the last 24 hours: Temp:  [97.9 F (36.6 C)-98.6 F (37 C)] 98 F (36.7 C) (02/07 0950) Pulse Rate:  [54-70] 70 (02/07 0950) Cardiac Rhythm: Atrial fibrillation;Atrial flutter;Bundle branch block (02/07 0700) Resp:  [16-20] 19 (02/07 0950) BP: (90-112)/(51-58) 90/58 (02/07 0950) SpO2:  [92 %-98 %] 98 % (02/07 0950) Weight:  [50.8 kg (112 lb 1.6 oz)] 50.8 kg (112 lb 1.6 oz) (02/07 0435)  Physical Exam: BP Readings from Last 1 Encounters:  08/17/17 (!) 90/58     Wt Readings from Last 1 Encounters:  08/17/17 50.8 kg (112 lb 1.6 oz)    Weight change: -0.181 kg (-6.4 oz) Body mass index is 18.65 kg/m. HEENT: [place]/AT, Eyes-Brown, PERL, EOMI, Conjunctiva-Pink, Sclera-Non-icteric Neck: No JVD, No bruit, Trachea midline. Lungs:  Clear, Bilateral. Cardiac:  Regular rhythm, normal S1 and S2, no S3. II/VI systolic murmur. Abdomen:  Soft, non-tender. BS present. Extremities:  No edema present. No cyanosis. No clubbing. CNS: AxOx3, Cranial nerves grossly intact, moves all 4 extremities.  Skin: Warm and dry.   Intake/Output from previous day: 02/06 0701 - 02/07 0700 In: 1546.2 [P.O.:960; I.V.:586.2] Out: 1875 [Urine:1875]    Lab Results: BMET    Component Value Date/Time   NA 135 08/17/2017 0450   NA 137 08/16/2017 0635   NA 137 08/15/2017 0442   K 4.5 08/17/2017 0450   K 4.2 08/16/2017 0635   K 4.1 08/15/2017 0442   CL 100 (L) 08/17/2017 0450   CL 106 08/16/2017 0635   CL 107 08/15/2017 0442   CO2 22 08/17/2017 0450   CO2 21 (L) 08/16/2017 0635   CO2 24 08/15/2017 0442   GLUCOSE 91 08/17/2017 0450   GLUCOSE 100 (H) 08/16/2017 0635   GLUCOSE 98 08/15/2017 0442   BUN 17 08/17/2017 0450   BUN 13 08/16/2017 0635   BUN 12 08/15/2017 0442   CREATININE 0.86 08/17/2017 0450   CREATININE 0.88 08/16/2017 0635   CREATININE 0.80 08/15/2017 0442   CALCIUM 9.2  08/17/2017 0450   CALCIUM 8.8 (L) 08/16/2017 0635   CALCIUM 8.8 (L) 08/15/2017 0442   GFRNONAA >60 08/17/2017 0450   GFRNONAA >60 08/16/2017 0635   GFRNONAA >60 08/15/2017 0442   GFRAA >60 08/17/2017 0450   GFRAA >60 08/16/2017 0635   GFRAA >60 08/15/2017 0442   CBC    Component Value Date/Time   WBC 10.7 (H) 08/17/2017 0450   RBC 4.95 08/17/2017 0450   HGB 15.3 08/17/2017 0450   HCT 44.5 08/17/2017 0450   PLT 174 08/17/2017 0450   MCV 89.9 08/17/2017 0450   MCH 30.9 08/17/2017 0450   MCHC 34.4 08/17/2017 0450   RDW 12.1 08/17/2017 0450   LYMPHSABS 2.2 08/09/2009 1110   MONOABS 0.8 08/09/2009 1110   EOSABS 0.1 08/09/2009 1110   BASOSABS 0.0 08/09/2009 1110   HEPATIC Function Panel No results for input(s): PROT in the last 8760 hours.  Invalid input(s):  ALBUMIN,  AST,  ALT,  ALKPHOS,  BILIDIR,  IBILI HEMOGLOBIN A1C No components found for: HGA1C,  MPG CARDIAC ENZYMES Lab Results  Component Value Date   CKTOTAL 263 (H) 08/10/2009   CKMB 1.8 08/10/2009   TROPONINI 0.04 (HH) 08/15/2017   TROPONINI 0.04 (HH) 08/15/2017   TROPONINI 0.02        NO INDICATION OF MYOCARDIAL INJURY. 08/10/2009   BNP No results for input(s): PROBNP in  the last 8760 hours. TSH Recent Labs    08/15/17 0442  TSH 1.014   CHOLESTEROL No results for input(s): CHOL in the last 8760 hours.  Scheduled Meds: . [MAR Hold] carvedilol  3.125 mg Oral BID WC  . [MAR Hold] coumadin book   Does not apply Once  . [MAR Hold] feeding supplement (ENSURE ENLIVE)  237 mL Oral TID BM  . [MAR Hold] furosemide  40 mg Intravenous Daily  . [MAR Hold] losartan  25 mg Oral Daily  . [MAR Hold] pneumococcal 23 valent vaccine  0.5 mL Intramuscular Tomorrow-1000  . [MAR Hold] potassium chloride  20 mEq Oral Daily  . [MAR Hold] sodium chloride flush  3 mL Intravenous Q12H  . [MAR Hold] warfarin   Does not apply Once  . [MAR Hold] Warfarin - Pharmacist Dosing Inpatient   Does not apply q1800   Continuous  Infusions: . [MAR Hold] sodium chloride    . sodium chloride 20 mL/hr at 08/16/17 2204  . heparin 1,200 Units/hr (08/17/17 0948)   PRN Meds:.[MAR Hold] sodium chloride, [MAR Hold] acetaminophen, [MAR Hold] ondansetron (ZOFRAN) IV, [MAR Hold] sodium chloride flush  Assessment/Plan: Acute congestive heart failure with preserved LV systolic function Cardiomyopathy New onset atrial fibrillation Hypertension Tobacco use disorder Congenital heart disease  TEE + Synchronized cardioversion   LOS: 3 days    Orpah Cobb  MD  08/17/2017, 10:49 AM

## 2017-08-17 NOTE — CV Procedure (Signed)
PRE-OP DIAGNOSIS:  Atrial fibrillation .  POST-OP DIAGNOSIS:  Atrial fibrillation to sinus rhythm.  OPERATOR:  Orpah Cobb.     ANESTHESIA:  Deep sdation with propofol .  COMPLICATIONS:  None.   OPERATIVE TERM:  DC cardioversion.  The nature of the procedure, risks and alternatives were discussed with the patient who gave informed consent.  OPERATIVE TECHNIQUE:  The patient was sedated with Propofol.  When the patient was no longer responsive to quiet voice, DC cardioversion was performed with 120 followed by 150 J biphasically and synchronously.  Patient converted to sinus rhythm.  The patient was then monitored until fully alert and left the procedure area in stable condition.  IMPRESSION:  Sucessful DC cardioversion.

## 2017-08-17 NOTE — Anesthesia Procedure Notes (Signed)
Procedure Name: MAC Date/Time: 08/17/2017 10:57 AM Performed by: White, Amedeo Plenty, CRNA Pre-anesthesia Checklist: Patient identified, Emergency Drugs available, Suction available and Patient being monitored Patient Re-evaluated:Patient Re-evaluated prior to induction Oxygen Delivery Method: Nasal cannula

## 2017-08-17 NOTE — Progress Notes (Signed)
ANTICOAGULATION CONSULT NOTE  Pharmacy Consult:  Heparin / Coumadin Indication: atrial fibrillation  Allergies  Allergen Reactions  . Aspirin     Internal bleeding     Patient Measurements: Height: 5\' 5"  (165.1 cm) Weight: 112 lb 1.6 oz (50.8 kg) IBW/kg (Calculated) : 61.5 Heparin Dosing Weight: 50 kg  Vital Signs: Temp: 97.4 F (36.3 C) (02/07 1202) Temp Source: Oral (02/07 1202) BP: 101/64 (02/07 1628) Pulse Rate: 65 (02/07 1628)  Labs: Recent Labs    08/15/17 0442 08/15/17 1015 08/15/17 2115 08/16/17 0635 08/17/17 0450 08/17/17 1545  HGB 13.2  --   --  14.2 15.3  --   HCT 37.7*  --   --  40.8 44.5  --   PLT 143*  --   --  150 174  --   APTT 46*  --  47* 95* 116*  --   LABPROT 22.9*  --   --  14.9 15.1  --   INR 2.05  --   --  1.18 1.20  --   HEPARINUNFRC >2.20*  --   --  0.32 0.28* 0.29*  CREATININE 0.80  --   --  0.88 0.86  --   TROPONINI 0.04* 0.04*  --   --   --   --      Assessment: 69 YOM with PMH of 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  Found to have new-onset Afib and given one dose of Xarelto in the ED.  Patient was then transitioned to IV heparin bridge to Coumadin.  Heparin was held this morning for MRI and resumed around 0945.  INR remains sub-therapeutic as expected.  No bleeding reported.  Follow up heparin level this afternoon is just below goal at 0.29. No bleeding issues noted.   Goal of Therapy:  Heparin level 0.3-0.7 units/mL INR 2 - 3 Monitor platelets by anticoagulation protocol: Yes     Plan:  Increase heparin gtt to 1300 units/hr Recheck level in am Repeat Coumadin 5mg  PO today - increase dose tomorrow if INR doesn't increase meaningfully  Daily heparin level, CBC, PT / INR F/u MRI  Sheppard Coil PharmD., BCPS Clinical Pharmacist 08/17/2017 4:47 PM

## 2017-08-17 NOTE — Progress Notes (Signed)
MD gave verbal order to change pt diet to Heart Healthy.

## 2017-08-17 NOTE — Progress Notes (Signed)
Subjective:  Patient denies any chest pain or shortness of breath. Scheduled for TEE synchronized DC cardioversion today. Cardiac MRI still pending. Old records from [place] could not be located. Patient states he had cardiac surgery at the age of 3-4. Objective:  Vital Signs in the last 24 hours: Temp:  [97.9 F (36.6 C)-98.6 F (37 C)] 98 F (36.7 C) (02/07 0950) Pulse Rate:  [54-70] 70 (02/07 0950) Resp:  [16-20] 19 (02/07 0950) BP: (90-112)/(51-58) 90/58 (02/07 0950) SpO2:  [92 %-98 %] 98 % (02/07 0950) Weight:  [50.8 kg (112 lb 1.6 oz)] 50.8 kg (112 lb 1.6 oz) (02/07 0435)  Intake/Output from previous day: 02/06 0701 - 02/07 0700 In: 1546.2 [P.O.:960; I.V.:586.2] Out: 1875 [Urine:1875] Intake/Output from this shift: No intake/output data recorded.  Physical Exam: Neck: no adenopathy, no carotid bruit, no JVD and supple, symmetrical, trachea midline Lungs: clear to auscultation bilaterally Heart: irregularly irregular rhythm, S1, S2 normal and 3/6 systolic murmur and soft diastolic murmur noted Abdomen: soft, non-tender; bowel sounds normal; no masses,  no organomegaly Extremities: extremities normal, atraumatic, no cyanosis or edema  Lab Results: Recent Labs    08/16/17 0635 08/17/17 0450  WBC 10.5 10.7*  HGB 14.2 15.3  PLT 150 174   Recent Labs    08/16/17 0635 08/17/17 0450  NA 137 135  K 4.2 4.5  CL 106 100*  CO2 21* 22  GLUCOSE 100* 91  BUN 13 17  CREATININE 0.88 0.86   Recent Labs    08/15/17 0442 08/15/17 1015  TROPONINI 0.04* 0.04*   Hepatic Function Panel No results for input(s): PROT, ALBUMIN, AST, ALT, ALKPHOS, BILITOT, BILIDIR, IBILI in the last 72 hours. No results for input(s): CHOL in the last 72 hours. No results for input(s): PROTIME in the last 72 hours.  Imaging: Imaging results have been reviewed and No results found.  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 fib with controlled ventricular response Hypertension Tobacco abuse Plan Continue present management Schedule for TEE/ DC synchronized cardioversion Check labs in a.m.  LOS: 3 days    Rinaldo Cloud 08/17/2017, 10:45 AM

## 2017-08-17 NOTE — Progress Notes (Signed)
Pt states he smokes daily and that he would like to quit. RN educated pt about smoking cessation and states there are resources to help with it. Pt states he will let RN know if he wants information regarding this prior to discharge.

## 2017-08-17 NOTE — Transfer of Care (Signed)
Immediate Anesthesia Transfer of Care Note  Patient: Gary [name]  Procedure(s) Performed: TRANSESOPHAGEAL ECHOCARDIOGRAM (TEE) (N/A ) CARDIOVERSION (N/A )  Patient Location: PACU  Anesthesia Type:General  Level of Consciousness: lethargic and responds to stimulation  Airway & Oxygen Therapy: Patient Spontanous Breathing and Patient connected to nasal cannula oxygen  Post-op Assessment: Report given to RN and Post -op Vital signs reviewed and stable  Post vital signs: Reviewed and stable  Last Vitals:  Vitals:   08/17/17 0435 08/17/17 0950  BP: (!) 112/55 (!) 90/58  Pulse: (!) 57 70  Resp: 16 19  Temp: 37 C 36.7 C  SpO2:  98%    Last Pain:  Vitals:   08/17/17 0950  TempSrc: Oral  PainSc:          Complications: No apparent anesthesia complications

## 2017-08-17 NOTE — Anesthesia Postprocedure Evaluation (Signed)
Anesthesia Post Note  Patient: Gary [name]  Procedure(s) Performed: TRANSESOPHAGEAL ECHOCARDIOGRAM (TEE) (N/A ) CARDIOVERSION (N/A )     Patient location during evaluation: Endoscopy Anesthesia Type: General Level of consciousness: awake Pain management: pain level controlled Vital Signs Assessment: post-procedure vital signs reviewed and stable Respiratory status: spontaneous breathing Cardiovascular status: stable Anesthetic complications: no    Last Vitals:  Vitals:   08/17/17 1129 08/17/17 1136  BP: 112/67 (!) 101/52  Pulse: 74 67  Resp: (!) 34 (!) 28  Temp:    SpO2: 99% 98%    Last Pain:  Vitals:   08/17/17 1129  TempSrc:   PainSc: 0-No pain                 Gaynelle Pastrana

## 2017-08-18 LAB — BASIC METABOLIC PANEL
Anion gap: 11 (ref 5–15)
BUN: 14 mg/dL (ref 6–20)
CALCIUM: 8.7 mg/dL — AB (ref 8.9–10.3)
CO2: 24 mmol/L (ref 22–32)
CREATININE: 0.81 mg/dL (ref 0.61–1.24)
Chloride: 100 mmol/L — ABNORMAL LOW (ref 101–111)
GFR calc Af Amer: >60 mL/min (ref >60)
Glucose, Bld: 134 mg/dL — ABNORMAL HIGH (ref 65–99)
Potassium: 4 mmol/L (ref 3.5–5.1)
SODIUM: 135 mmol/L (ref 135–145)

## 2017-08-18 LAB — CBC
HCT: 39.4 % (ref 39.0–52.0)
Hemoglobin: 13.4 g/dL (ref 13.0–17.0)
MCH: 30.7 pg (ref 26.0–34.0)
MCHC: 34 g/dL (ref 30.0–36.0)
MCV: 90.4 fL (ref 78.0–100.0)
PLATELETS: 148 10*3/uL — AB (ref 150–400)
RBC: 4.36 MIL/uL (ref 4.22–5.81)
RDW: 12 % (ref 11.5–15.5)
WBC: 9.4 10*3/uL (ref 4.0–10.5)

## 2017-08-18 LAB — BRAIN NATRIURETIC PEPTIDE: B Natriuretic Peptide: 685.4 pg/mL — ABNORMAL HIGH (ref 0.0–100.0)

## 2017-08-18 LAB — PROTIME-INR
INR: 1.57
PROTHROMBIN TIME: 18.6 s — AB (ref 11.4–15.2)

## 2017-08-18 LAB — HEPARIN LEVEL (UNFRACTIONATED): HEPARIN UNFRACTIONATED: 0.46 [IU]/mL (ref 0.30–0.70)

## 2017-08-18 MED ORDER — WARFARIN SODIUM 5 MG PO TABS
5.0000 mg | ORAL_TABLET | Freq: Once | ORAL | Status: AC
  Administered 2017-08-18: 5 mg via ORAL
  Filled 2017-08-18: qty 1

## 2017-08-18 MED ORDER — AMIODARONE HCL 200 MG PO TABS
200.0000 mg | ORAL_TABLET | Freq: Every day | ORAL | Status: DC
Start: 2017-08-19 — End: 2017-08-21
  Administered 2017-08-19 – 2017-08-21 (×3): 200 mg via ORAL
  Filled 2017-08-18 (×3): qty 1

## 2017-08-18 MED ORDER — AMIODARONE HCL 200 MG PO TABS
400.0000 mg | ORAL_TABLET | Freq: Once | ORAL | Status: AC
  Administered 2017-08-18: 400 mg via ORAL
  Filled 2017-08-18: qty 2

## 2017-08-18 NOTE — Progress Notes (Signed)
Subjective:  Patient denies any chest pain or shortness of breath.  States feeling better after TEE cardioversion.  Remains in sinus rhythm.  Tolerating heparin and Coumadin. Cardiac MRI consistent with non-compaction Cardiomyopathy. Objective:  Vital Signs in the last 24 hours: Temp:  [97.4 F (36.3 C)-98.4 F (36.9 C)] 98 F (36.7 C) (02/08 0552) Pulse Rate:  [59-80] 72 (02/08 0819) Resp:  [19-34] 20 (02/07 2018) BP: (90-120)/(52-98) 101/62 (02/08 0819) SpO2:  [94 %-100 %] 100 % (02/08 0552) Weight:  [50.3 kg (110 lb 14.4 oz)] 50.3 kg (110 lb 14.4 oz) (02/08 0552)  Intake/Output from previous day: 02/07 0701 - 02/08 0700 In: 1085.7 [P.O.:240; I.V.:845.7] Out: 1025 [Urine:1025] Intake/Output from this shift: No intake/output data recorded.  Physical Exam: Neck: no adenopathy, no carotid bruit, no JVD and supple, symmetrical, trachea midline Lungs: clear to auscultation bilaterally Heart: regular rate and rhythm, S1, S2 normal and 3/6 systolic diastolic murmur noted Abdomen: soft, non-tender; bowel sounds normal; no masses,  no organomegaly Extremities: extremities normal, atraumatic, no cyanosis or edema  Lab Results: Recent Labs    08/17/17 0450 08/18/17 0432  WBC 10.7* 9.4  HGB 15.3 13.4  PLT 174 148*   Recent Labs    08/17/17 0450 08/18/17 0432  NA 135 135  K 4.5 4.0  CL 100* 100*  CO2 22 24  GLUCOSE 91 134*  BUN 17 14  CREATININE 0.86 0.81   Recent Labs    08/15/17 1015  TROPONINI 0.04*   Hepatic Function Panel No results for input(s): PROT, ALBUMIN, AST, ALT, ALKPHOS, BILITOT, BILIDIR, IBILI in the last 72 hours. No results for input(s): CHOL in the last 72 hours. No results for input(s): PROTIME in the last 72 hours.  Imaging: Imaging results have been reviewed and Mr Cardiac Morphology W Wo Contrast  Result Date: 08/17/2017 CLINICAL DATA:  42 year old male with CHF and suspicion for a non-compaction cardiomyopathy. EXAM: CARDIAC MRI TECHNIQUE: The  patient was scanned on a 1.5 Tesla GE magnet. A dedicated cardiac coil was used. Functional imaging was done using Fiesta sequences. 2,3, and 4 chamber views were done to assess for RWMA's. Modified Simpson's rule using a short axis stack was used to calculate an ejection fraction on a dedicated work [employer]. The patient received 27 cc of Multihance. After 10 minutes inversion recovery sequences were used to assess for infiltration and scar tissue. CONTRAST:  27 cc  of Multihance FINDINGS: 1. Severely dilated left ventricular size, with normal thickness and severely decreased systolic function (LVEF =31%). There diffuse hypokinesis and paradoxical septal motion. There is no late gadolinium enhancement in the left ventricular myocardium. LVEDD: 82 mm LVESD: 76 mm LVEDV: 370 ml LVESV: 254 ml SV: 116 ml CO: 4.8 L/min Myocardial mass: 190 g 2. Normal right ventricular size, thickness and mildly decreased systolic function (LVEF = 42%). There are no regional wall motion abnormalities. 3. Moderately dilated left atrium and mildly dilated right atrial size. 4. Normal size of the aortic root, ascending aorta. Moderately dilated pulmonary artery measuring 37 mm. 5. Mild to moderate aortic insufficiency. Moderate mitral regurgitation with posteriorly directed jet. Mild tricuspid regurgitation. No significant pulmonary regurgitation is seen. 6.  Normal pericardium.  No pericardial effusion. IMPRESSION: 1. Severely dilated left ventricular size, with normal thickness and severely decreased systolic function (LVEF =31%). There diffuse hypokinesis and paradoxical septal motion. There is no late gadolinium enhancement in the left ventricular myocardium. 2. Normal right ventricular size, thickness and mildly decreased systolic function (  LVEF = 42%). There are no regional wall motion abnormalities. 3. Moderately dilated left atrium and mildly dilated right atrial size. 4. Normal size of the aortic root,  ascending aorta. Moderately dilated pulmonary artery measuring 37 mm. 5. Mild to moderate aortic insufficiency. Moderate mitral regurgitation with posteriorly directed jet. Mild tricuspid regurgitation. No significant pulmonary regurgitation is seen. There are significant trabeculations in the left ventricular apex with non-compacted to compacted myocardium ratio: 2.6:1. This is consistent with non-compaction cardiomyopathy. 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 fib with controlled ventricular response status post TEE assisted synchronized DC cardioversion Hypertension Tobacco abuse Plan DC Coreg. Start amiodarone as per orders.    LOS: 4 days    Rinaldo Cloud 08/18/2017, 9:02 AM

## 2017-08-18 NOTE — Plan of Care (Signed)
  Education: Knowledge of General Education information will improve 08/18/2017 1152 - Progressing by Forbes Cellar, RN

## 2017-08-18 NOTE — Plan of Care (Signed)
Pt. Stated he wants to stop smoking. Information on smoking cessation given.

## 2017-08-18 NOTE — Progress Notes (Signed)
ANTICOAGULATION CONSULT NOTE  Pharmacy Consult:  Heparin / Coumadin Indication: atrial fibrillation  Allergies  Allergen Reactions  . Aspirin     Internal bleeding     Patient Measurements: Height: 5\' 5"  (165.1 cm) Weight: 110 lb 14.4 oz (50.3 kg)(c scale) IBW/kg (Calculated) : 61.5 Heparin Dosing Weight: 50 kg  Vital Signs: Temp: 98 F (36.7 C) (02/08 0552) Temp Source: Oral (02/08 0552) BP: 91/62 (02/08 0552) Pulse Rate: 69 (02/08 0552)  Labs: Recent Labs    08/15/17 1015 08/15/17 2115  08/16/17 0635 08/17/17 0450 08/17/17 1545 08/18/17 0432  HGB  --   --    < > 14.2 15.3  --  13.4  HCT  --   --   --  40.8 44.5  --  39.4  PLT  --   --   --  150 174  --  148*  APTT  --  47*  --  95* 116*  --   --   LABPROT  --   --   --  14.9 15.1  --  18.6*  INR  --   --   --  1.18 1.20  --  1.57  HEPARINUNFRC  --   --    < > 0.32 0.28* 0.29* 0.46  CREATININE  --   --   --  0.88 0.86  --  0.81  TROPONINI 0.04*  --   --   --   --   --   --    < > = values in this interval not displayed.     Assessment: 61 YOM with PMH of 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  Found to have new-onset Afib and given one dose of Xarelto in the ED.  Patient was then transitioned to IV heparin bridge to Coumadin.  Heparin level therapeutic and INR is trending up.  No bleeding reported.   Goal of Therapy:  Heparin level 0.3-0.7 units/mL INR 2 - 3 Monitor platelets by anticoagulation protocol: Yes     Plan:  Continue heparin gtt at 1300 units/hr Repeat Coumadin 5mg  PO today Daily heparin level, CBC, PT / INR   Kaslyn [place] D. Laney Potash, PharmD, BCPS Pager:  [phone] 08/18/2017, 7:55 AM

## 2017-08-19 LAB — BASIC METABOLIC PANEL
ANION GAP: 10 (ref 5–15)
BUN: 14 mg/dL (ref 6–20)
CALCIUM: 8.9 mg/dL (ref 8.9–10.3)
CO2: 23 mmol/L (ref 22–32)
Chloride: 101 mmol/L (ref 101–111)
Creatinine, Ser: 0.81 mg/dL (ref 0.61–1.24)
GFR calc non Af Amer: >60 mL/min (ref >60)
GLUCOSE: 114 mg/dL — AB (ref 65–99)
Potassium: 4.4 mmol/L (ref 3.5–5.1)
Sodium: 134 mmol/L — ABNORMAL LOW (ref 135–145)

## 2017-08-19 LAB — CBC
HCT: 40.4 % (ref 39.0–52.0)
HEMOGLOBIN: 13.8 g/dL (ref 13.0–17.0)
MCH: 30.9 pg (ref 26.0–34.0)
MCHC: 34.2 g/dL (ref 30.0–36.0)
MCV: 90.6 fL (ref 78.0–100.0)
Platelets: 144 10*3/uL — ABNORMAL LOW (ref 150–400)
RBC: 4.46 MIL/uL (ref 4.22–5.81)
RDW: 11.9 % (ref 11.5–15.5)
WBC: 8.5 10*3/uL (ref 4.0–10.5)

## 2017-08-19 LAB — PROTIME-INR
INR: 1.83
PROTHROMBIN TIME: 21 s — AB (ref 11.4–15.2)

## 2017-08-19 LAB — HEPARIN LEVEL (UNFRACTIONATED): Heparin Unfractionated: 0.67 IU/mL (ref 0.30–0.70)

## 2017-08-19 MED ORDER — WARFARIN SODIUM 5 MG PO TABS
5.0000 mg | ORAL_TABLET | Freq: Once | ORAL | Status: AC
  Administered 2017-08-19: 5 mg via ORAL
  Filled 2017-08-19: qty 1

## 2017-08-19 NOTE — Progress Notes (Signed)
ANTICOAGULATION CONSULT NOTE - Follow Up Consult  Pharmacy Consult for Heparin and Coumadin Indication: atrial fibrillation  Allergies  Allergen Reactions  . Aspirin     Internal bleeding     Patient Measurements: Height: 5\' 5"  (165.1 cm) Weight: 109 lb 8 oz (49.7 kg) IBW/kg (Calculated) : 61.5 Heparin Dosing Weight: 50 kg  Vital Signs: Temp: 98.2 F (36.8 C) (02/09 1228) Temp Source: Oral (02/09 1228) BP: 112/56 (02/09 1228) Pulse Rate: 61 (02/09 1228)  Labs: Recent Labs    08/17/17 0450 08/17/17 1545 08/18/17 0432 08/19/17 0238 08/19/17 1425  HGB 15.3  --  13.4 13.8  --   HCT 44.5  --  39.4 40.4  --   PLT 174  --  148* 144*  --   APTT 116*  --   --   --   --   LABPROT 15.1  --  18.6* 21.0*  --   INR 1.20  --  1.57 1.83  --   HEPARINUNFRC 0.28* 0.29* 0.46  --  0.67  CREATININE 0.86  --  0.81 0.81  --     Estimated Creatinine Clearance: 83.5 mL/min (by C-G formula based on SCr of 0.81 mg/dL).  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  Found to have new-onset Afib and given one dose of Xarelto in the ED.  Patient was then transitioned to IV heparin bridge to Coumadin.    Heparin level is therapeutic (0.67) on 1300 units/hr.   INR up to 1.83 after Coumadin 5 mg daily x 4 days.   Amiodarone added 2/8.   Goal of Therapy:  INR 2-3 Heparin level 0.3-0.7 units/ml Monitor platelets by anticoagulation protocol: Yes   Plan:   Continue heparin drip at 1300 units/hr  Coumadin 5 mg x 1 today.  Daily heparin level, PT/INR and CBC.  Will watch for increased sensitivity to Coumadin with addition of Amiodarone 2/8.  Dennie Fetters, [place] Pager: [phone] or [phone] 08/19/2017,4:23 PM

## 2017-08-19 NOTE — Progress Notes (Signed)
Ref: Patient, No Pcp Per   Subjective:  Feeling better. VS stable.  Objective:  Vital Signs in the last 24 hours: Temp:  [98.2 F (36.8 C)] 98.2 F (36.8 C) (02/09 0539) Pulse Rate:  [59-70] 63 (02/09 0926) Cardiac Rhythm: Normal sinus rhythm;Bundle branch block;Heart block (02/09 0700) Resp:  [18] 18 (02/09 0539) BP: (98-121)/(56-78) 105/66 (02/09 0926) SpO2:  [97 %-100 %] 100 % (02/09 0539) Weight:  [49.7 kg (109 lb 8 oz)] 49.7 kg (109 lb 8 oz) (02/09 0539)  Physical Exam: BP Readings from Last 1 Encounters:  08/19/17 105/66     Wt Readings from Last 1 Encounters:  08/19/17 49.7 kg (109 lb 8 oz)    Weight change: -0.635 kg (-6.4 oz) Body mass index is 18.22 kg/m. HEENT: [place]/AT, Eyes-Brown, PERL, EOMI, Conjunctiva-Pink, Sclera-Non-icteric Neck: No JVD, No bruit, Trachea midline. Lungs:  Clear, Bilateral. Cardiac:  Regular rhythm, normal S1 and S2, no S3. II/VI systolic murmur. Abdomen:  Soft, non-tender. BS present. Extremities:  No edema present. No cyanosis. No clubbing. CNS: AxOx3, Cranial nerves grossly intact, moves all 4 extremities.  Skin: Warm and dry.   Intake/Output from previous day: 02/08 0701 - 02/09 0700 In: 364 [I.V.:364] Out: 2200 [Urine:2200]    Lab Results: BMET    Component Value Date/Time   NA 134 (L) 08/19/2017 0238   NA 135 08/18/2017 0432   NA 135 08/17/2017 0450   K 4.4 08/19/2017 0238   K 4.0 08/18/2017 0432   K 4.5 08/17/2017 0450   CL 101 08/19/2017 0238   CL 100 (L) 08/18/2017 0432   CL 100 (L) 08/17/2017 0450   CO2 23 08/19/2017 0238   CO2 24 08/18/2017 0432   CO2 22 08/17/2017 0450   GLUCOSE 114 (H) 08/19/2017 0238   GLUCOSE 134 (H) 08/18/2017 0432   GLUCOSE 91 08/17/2017 0450   BUN 14 08/19/2017 0238   BUN 14 08/18/2017 0432   BUN 17 08/17/2017 0450   CREATININE 0.81 08/19/2017 0238   CREATININE 0.81 08/18/2017 0432   CREATININE 0.86 08/17/2017 0450   CALCIUM 8.9 08/19/2017 0238   CALCIUM 8.7 (L) 08/18/2017 0432   CALCIUM 9.2 08/17/2017 0450   GFRNONAA >60 08/19/2017 0238   GFRNONAA >60 08/18/2017 0432   GFRNONAA >60 08/17/2017 0450   GFRAA >60 08/19/2017 0238   GFRAA >60 08/18/2017 0432   GFRAA >60 08/17/2017 0450   CBC    Component Value Date/Time   WBC 8.5 08/19/2017 0238   RBC 4.46 08/19/2017 0238   HGB 13.8 08/19/2017 0238   HCT 40.4 08/19/2017 0238   PLT 144 (L) 08/19/2017 0238   MCV 90.6 08/19/2017 0238   MCH 30.9 08/19/2017 0238   MCHC 34.2 08/19/2017 0238   RDW 11.9 08/19/2017 0238   LYMPHSABS 2.2 08/09/2009 1110   MONOABS 0.8 08/09/2009 1110   EOSABS 0.1 08/09/2009 1110   BASOSABS 0.0 08/09/2009 1110   HEPATIC Function Panel No results for input(s): PROT in the last 8760 hours.  Invalid input(s):  ALBUMIN,  AST,  ALT,  ALKPHOS,  BILIDIR,  IBILI HEMOGLOBIN A1C No components found for: HGA1C,  MPG CARDIAC ENZYMES Lab Results  Component Value Date   CKTOTAL 263 (H) 08/10/2009   CKMB 1.8 08/10/2009   TROPONINI 0.04 (HH) 08/15/2017   TROPONINI 0.04 (HH) 08/15/2017   TROPONINI 0.02        NO INDICATION OF MYOCARDIAL INJURY. 08/10/2009   BNP No results for input(s): PROBNP in the last 8760 hours. TSH Recent Labs  08/15/17 0442  TSH 1.014   CHOLESTEROL No results for input(s): CHOL in the last 8760 hours.  Scheduled Meds: . amiodarone  200 mg Oral Daily  . coumadin book   Does not apply Once  . feeding supplement (ENSURE ENLIVE)  237 mL Oral TID BM  . furosemide  40 mg Intravenous Daily  . losartan  25 mg Oral Daily  . potassium chloride  20 mEq Oral Daily  . sodium chloride flush  3 mL Intravenous Q12H  . warfarin   Does not apply Once  . Warfarin - Pharmacist Dosing Inpatient   Does not apply q1800   Continuous Infusions: . sodium chloride    . heparin 1,300 Units/hr (08/18/17 1735)   PRN Meds:.sodium chloride, acetaminophen, ondansetron (ZOFRAN) IV, sodium chloride flush  Assessment/Plan: Acute congestive heart failure with preserved LV systolic  function LV non-compaction cardiomyopathy Valvular heart disease Paroxysmal atrial fibrillation Hypertension Tobacco use disorder  Continue medical treatment.    LOS: 5 days    Orpah Cobb  MD  08/19/2017, 10:14 AM

## 2017-08-20 LAB — BASIC METABOLIC PANEL
Anion gap: 13 (ref 5–15)
BUN: 20 mg/dL (ref 6–20)
CALCIUM: 8.8 mg/dL — AB (ref 8.9–10.3)
CO2: 22 mmol/L (ref 22–32)
CREATININE: 0.81 mg/dL (ref 0.61–1.24)
Chloride: 97 mmol/L — ABNORMAL LOW (ref 101–111)
GFR calc non Af Amer: >60 mL/min (ref >60)
Glucose, Bld: 102 mg/dL — ABNORMAL HIGH (ref 65–99)
Potassium: 4.5 mmol/L (ref 3.5–5.1)
SODIUM: 132 mmol/L — AB (ref 135–145)

## 2017-08-20 LAB — HEPARIN LEVEL (UNFRACTIONATED): HEPARIN UNFRACTIONATED: 0.65 [IU]/mL (ref 0.30–0.70)

## 2017-08-20 LAB — CBC
HCT: 41.4 % (ref 39.0–52.0)
Hemoglobin: 14.3 g/dL (ref 13.0–17.0)
MCH: 31.3 pg (ref 26.0–34.0)
MCHC: 34.5 g/dL (ref 30.0–36.0)
MCV: 90.6 fL (ref 78.0–100.0)
PLATELETS: 159 10*3/uL (ref 150–400)
RBC: 4.57 MIL/uL (ref 4.22–5.81)
RDW: 11.9 % (ref 11.5–15.5)
WBC: 8.8 10*3/uL (ref 4.0–10.5)

## 2017-08-20 LAB — PROTIME-INR
INR: 1.91
PROTHROMBIN TIME: 21.7 s — AB (ref 11.4–15.2)

## 2017-08-20 MED ORDER — WARFARIN SODIUM 5 MG PO TABS
5.0000 mg | ORAL_TABLET | Freq: Once | ORAL | Status: AC
  Administered 2017-08-20: 5 mg via ORAL
  Filled 2017-08-20: qty 1

## 2017-08-20 NOTE — Progress Notes (Signed)
Ref: Patient, No Pcp Per   Subjective:  VS stable. INR-1.91  Objective:  Vital Signs in the last 24 hours: Temp:  [97.1 F (36.2 C)-98.2 F (36.8 C)] 97.1 F (36.2 C) (02/10 1300) Pulse Rate:  [60-81] 60 (02/10 1300) Cardiac Rhythm: Normal sinus rhythm (02/10 0724) Resp:  [18] 18 (02/10 1300) BP: (96-136)/(52-77) 100/59 (02/10 1300) SpO2:  [98 %-99 %] 98 % (02/10 1300) Weight:  [49.2 kg (108 lb 6.4 oz)] 49.2 kg (108 lb 6.4 oz) (02/10 0558)  Physical Exam: BP Readings from Last 1 Encounters:  08/20/17 (!) 100/59     Wt Readings from Last 1 Encounters:  08/20/17 49.2 kg (108 lb 6.4 oz)    Weight change: -0.499 kg (-1.6 oz) Body mass index is 18.04 kg/m. HEENT: [place]/AT, Eyes-Brown, PERL, EOMI, Conjunctiva-Pink, Sclera-Non-icteric Neck: No JVD, No bruit, Trachea midline. Lungs:  Clear, Bilateral. Cardiac:  Regular rhythm, normal S1 and S2, no S3. II/VI systolic murmur. Abdomen:  Soft, non-tender. BS present. Extremities:  No edema present. No cyanosis. No clubbing. CNS: AxOx3, Cranial nerves grossly intact, moves all 4 extremities.  Skin: Warm and dry.   Intake/Output from previous day: 02/09 0701 - 02/10 0700 In: 1413 [P.O.:1140; I.V.:273] Out: 800 [Urine:800]    Lab Results: BMET    Component Value Date/Time   NA 132 (L) 08/20/2017 0522   NA 134 (L) 08/19/2017 0238   NA 135 08/18/2017 0432   K 4.5 08/20/2017 0522   K 4.4 08/19/2017 0238   K 4.0 08/18/2017 0432   CL 97 (L) 08/20/2017 0522   CL 101 08/19/2017 0238   CL 100 (L) 08/18/2017 0432   CO2 22 08/20/2017 0522   CO2 23 08/19/2017 0238   CO2 24 08/18/2017 0432   GLUCOSE 102 (H) 08/20/2017 0522   GLUCOSE 114 (H) 08/19/2017 0238   GLUCOSE 134 (H) 08/18/2017 0432   BUN 20 08/20/2017 0522   BUN 14 08/19/2017 0238   BUN 14 08/18/2017 0432   CREATININE 0.81 08/20/2017 0522   CREATININE 0.81 08/19/2017 0238   CREATININE 0.81 08/18/2017 0432   CALCIUM 8.8 (L) 08/20/2017 0522   CALCIUM 8.9 08/19/2017 0238    CALCIUM 8.7 (L) 08/18/2017 0432   GFRNONAA >60 08/20/2017 0522   GFRNONAA >60 08/19/2017 0238   GFRNONAA >60 08/18/2017 0432   GFRAA >60 08/20/2017 0522   GFRAA >60 08/19/2017 0238   GFRAA >60 08/18/2017 0432   CBC    Component Value Date/Time   WBC 8.8 08/20/2017 0522   RBC 4.57 08/20/2017 0522   HGB 14.3 08/20/2017 0522   HCT 41.4 08/20/2017 0522   PLT 159 08/20/2017 0522   MCV 90.6 08/20/2017 0522   MCH 31.3 08/20/2017 0522   MCHC 34.5 08/20/2017 0522   RDW 11.9 08/20/2017 0522   LYMPHSABS 2.2 08/09/2009 1110   MONOABS 0.8 08/09/2009 1110   EOSABS 0.1 08/09/2009 1110   BASOSABS 0.0 08/09/2009 1110   HEPATIC Function Panel No results for input(s): PROT in the last 8760 hours.  Invalid input(s):  ALBUMIN,  AST,  ALT,  ALKPHOS,  BILIDIR,  IBILI HEMOGLOBIN A1C No components found for: HGA1C,  MPG CARDIAC ENZYMES Lab Results  Component Value Date   CKTOTAL 263 (H) 08/10/2009   CKMB 1.8 08/10/2009   TROPONINI 0.04 (HH) 08/15/2017   TROPONINI 0.04 (HH) 08/15/2017   TROPONINI 0.02        NO INDICATION OF MYOCARDIAL INJURY. 08/10/2009   BNP No results for input(s): PROBNP in the last 8760  hours. TSH Recent Labs    08/15/17 0442  TSH 1.014   CHOLESTEROL No results for input(s): CHOL in the last 8760 hours.  Scheduled Meds: . amiodarone  200 mg Oral Daily  . coumadin book   Does not apply Once  . feeding supplement (ENSURE ENLIVE)  237 mL Oral TID BM  . furosemide  40 mg Intravenous Daily  . losartan  25 mg Oral Daily  . potassium chloride  20 mEq Oral Daily  . sodium chloride flush  3 mL Intravenous Q12H  . warfarin  5 mg Oral ONCE-1800  . warfarin   Does not apply Once  . Warfarin - Pharmacist Dosing Inpatient   Does not apply q1800   Continuous Infusions: . sodium chloride    . heparin 1,300 Units/hr (08/20/17 0659)   PRN Meds:.sodium chloride, acetaminophen, ondansetron (ZOFRAN) IV, sodium chloride flush  Assessment/Plan: Acute CHF with preserved  LV systolic function LV non-compaction cardiomyopathy Valvular heart disease Paroxysmal atrial fibrillation Tobacco use disorder Hypertension  INR goal 2-3. Home soon.   LOS: 6 days    Orpah Cobb  MD  08/20/2017, 4:06 PM

## 2017-08-20 NOTE — Progress Notes (Signed)
ANTICOAGULATION CONSULT NOTE - Follow Up Consult  Pharmacy Consult for Heparin and Coumadin Indication: atrial fibrillation  Allergies  Allergen Reactions  . Aspirin     Internal bleeding     Patient Measurements: Height: 5\' 5"  (165.1 cm) Weight: 108 lb 6.4 oz (49.2 kg) IBW/kg (Calculated) : 61.5 Heparin Dosing Weight: 50 kg  Vital Signs: Temp: 97.8 F (36.6 C) (02/10 0558) Temp Source: Oral (02/10 0558) BP: 136/77 (02/10 0558) Pulse Rate: 61 (02/10 0558)  Labs: Recent Labs    08/18/17 0432 08/19/17 0238 08/19/17 1425 08/20/17 0522  HGB 13.4 13.8  --  14.3  HCT 39.4 40.4  --  41.4  PLT 148* 144*  --  159  LABPROT 18.6* 21.0*  --  21.7*  INR 1.57 1.83  --  1.91  HEPARINUNFRC 0.46  --  0.67 0.65  CREATININE 0.81 0.81  --  0.81    Estimated Creatinine Clearance: 82.7 mL/min (by C-G formula based on SCr of 0.81 mg/dL).  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  Found to have new-onset Afib and given one dose of Xarelto in the ED.  Patient was then transitioned to IV heparin bridge to Coumadin.    Heparin level remains therapeutic (0.65) on 1300 units/hr.   INR up to 1.91 after Coumadin 5 mg daily x 5 days.   Amiodarone added 2/8.   Goal of Therapy:  INR 2-3 Heparin level 0.3-0.7 units/ml Monitor platelets by anticoagulation protocol: Yes   Plan:   Continue heparin drip at 1300 units/hr  Coumadin 5 mg x 1 again today.  Daily heparin level, PT/INR and CBC.  Will watch for increased sensitivity to Coumadin with addition of Amiodarone 2/8.  Dennie Fetters, RPh Pager: [phone] or [phone] 08/20/2017,2:07 PM

## 2017-08-21 LAB — HEPARIN LEVEL (UNFRACTIONATED): Heparin Unfractionated: 1.05 IU/mL — ABNORMAL HIGH (ref 0.30–0.70)

## 2017-08-21 LAB — CBC
HEMATOCRIT: 43 % (ref 39.0–52.0)
HEMOGLOBIN: 15.2 g/dL (ref 13.0–17.0)
MCH: 32 pg (ref 26.0–34.0)
MCHC: 35.3 g/dL (ref 30.0–36.0)
MCV: 90.5 fL (ref 78.0–100.0)
Platelets: 166 10*3/uL (ref 150–400)
RBC: 4.75 MIL/uL (ref 4.22–5.81)
RDW: 11.9 % (ref 11.5–15.5)
WBC: 10.2 10*3/uL (ref 4.0–10.5)

## 2017-08-21 LAB — PROTIME-INR
INR: 2.12
PROTHROMBIN TIME: 23.6 s — AB (ref 11.4–15.2)

## 2017-08-21 MED ORDER — LOSARTAN POTASSIUM 25 MG PO TABS: 25.0000 mg | ORAL_TABLET | Freq: Every day | ORAL | 3 refills | Status: DC

## 2017-08-21 MED ORDER — ENSURE ENLIVE PO LIQD: 237.0000 mL | Freq: Three times a day (TID) | ORAL | 12 refills | Status: DC

## 2017-08-21 MED ORDER — AMIODARONE HCL 200 MG PO TABS: 200.0000 mg | ORAL_TABLET | Freq: Every day | ORAL | 3 refills | Status: DC

## 2017-08-21 MED ORDER — WARFARIN SODIUM 5 MG PO TABS: 5.0000 mg | ORAL_TABLET | Freq: Once | ORAL | 3 refills | Status: DC

## 2017-08-21 MED ORDER — WARFARIN SODIUM 5 MG PO TABS: 5.0000 mg | ORAL_TABLET | Freq: Once | ORAL | Status: DC

## 2017-08-21 NOTE — Progress Notes (Addendum)
Pt discharge education provided at bedside with pt and pt family. Pt IV discontinued, catheter intact and telemetry removed by NT. Pt has all belongings. Pt refused wheelchair, pt ambulated off unit with family.

## 2017-08-21 NOTE — Progress Notes (Signed)
ANTICOAGULATION CONSULT NOTE - Follow Up Consult  Pharmacy Consult for Heparin and Coumadin Indication: atrial fibrillation  Allergies  Allergen Reactions  . Aspirin     Internal bleeding     Patient Measurements: Height: 5\' 5"  (165.1 cm) Weight: 107 lb 12.8 oz (48.9 kg) IBW/kg (Calculated) : 61.5 Heparin Dosing Weight: 50 kg  Vital Signs: Temp: 98.6 F (37 C) (02/11 0532) Temp Source: Oral (02/11 0532) BP: 110/62 (02/11 0532) Pulse Rate: 68 (02/11 0532)  Labs: Recent Labs    08/19/17 0238 08/19/17 1425 08/20/17 0522 08/21/17 0626  HGB 13.8  --  14.3 15.2  HCT 40.4  --  41.4 43.0  PLT 144*  --  159 166  LABPROT 21.0*  --  21.7* 23.6*  INR 1.83  --  1.91 2.12  HEPARINUNFRC  --  0.67 0.65 1.05*  CREATININE 0.81  --  0.81  --     Estimated Creatinine Clearance: 82.2 mL/min (by C-G formula based on SCr of 0.81 mg/dL).  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  Found to have new-onset Afib and given one dose of Xarelto in the ED.  Patient was then transitioned to IV heparin bridge to Coumadin.    Heparin level remains therapeutic (1.02) on 1300 units/hr.  Drawn from opposite arm.   INR up to 2.1 after Coumadin 5 mg daily x 6 days.   Amiodarone added 2/8.   Goal of Therapy:  INR 2-3 Heparin level 0.3-0.7 units/ml Monitor platelets by anticoagulation protocol: Yes   Plan:   D/c IV heparin.  Coumadin 5 mg x 1 again today.  Daily heparin level, PT/INR and CBC.  Will watch for increased sensitivity to Coumadin with addition of Amiodarone 2/8.  Tad Moore, BCPS  Clinical Pharmacist Pager [phone]  08/21/2017 10:01 AM

## 2017-08-21 NOTE — Plan of Care (Signed)
  Education: Knowledge of General Education information will improve 08/21/2017 1050 - Progressing by Loel Lofty, RN

## 2017-08-21 NOTE — Discharge Summary (Signed)
Discharge summary dictated on 08/21/2017 dictation number is [id number]

## 2017-08-21 NOTE — Discharge Summary (Signed)
NAMEASHBY, MOSKAL NO.:  [id number]  MEDICAL RECORD NO.:  [id number]  LOCATION:  WA22                         FACILITY:  [facility]  PHYSICIAN:  Charbel Los N. Sharyn Lull, M.D. DATE OF BIRTH:  1976/06/14  DATE OF ADMISSION:  08/14/2017 DATE OF DISCHARGE:  08/21/2017                              DISCHARGE SUMMARY   ADMITTING DIAGNOSES: 1. Acute congestive heart failure secondary to preserved left     ventricular systolic function. 2. Valvular heart disease. 3. Congenital heart disease, status post open heart surgery in remote     past, record is not available. 4. New onset atrial fibrillation with controlled ventricular response. 5. Hypertension. 6. Tobacco abuse.  FINAL DIAGNOSES: 1. Status post compensated congestive heart failure secondary to     preserved left ventricular systolic function. 2. Valvular heart disease. 3. Left ventricle noncompaction cardiomyopathy. 4. Status post atrial fibrillation with controlled ventricular     response converted to sinus rhythm by a synchronized DC     cardioversion with drug-eluting stent assistance. 5. Hypertension. 6. Tobacco abuse.  DISCHARGE MEDICATIONS: 1. Amiodarone 200 mg 1 tablet daily. 2. Losartan 25 mg 1 tab daily. 3. Warfarin 5 mg daily. 4. Feeding supplement 3 times daily. 5. Ensure multivitamin 1 tablet daily.  DIET:  Low salt, low cholesterol.  ACTIVITY:  As tolerated.  The patient has been advised to monitor PT/INR early later this week.  FOLLOWUP:  Follow up with me in 1 week.  CONDITION AT DISCHARGE:  Stable.  BRIEF HISTORY AND HOSPITAL COURSE:  Mr. Streight is a 42 year old male with past medical history significant for questionable congenital heart disease, status post open heart surgery in [place], [place], as a child, old record is not available, hypertension, tobacco abuse.  He came to the ER complaining of progressive increasing shortness of breath associated with skipping of the heartbeat  and feeling tired, fatigue and no energy.  Denies any chest pain, nausea, vomiting, diaphoresis, has stopped all his medication.  EKG done in the ED showed new onset AFib with controlled ventricular response, LVH with strain pattern.  First set of cardiac enzymes was negative.  The patient was noted to have elevated BNP of 1173.  PHYSICAL EXAMINATION:  GENERAL:  He was alert, awake, oriented x3. VITAL SIGNS:  Blood pressure was 110/71, pulse 64, irregularly irregular.  He was afebrile. EYES:  Conjunctivae were pink. NECK:  Supple.  No JVD.  No bruit. LUNGS:  He has faint bibasilar rales. CARDIOVASCULAR:  Irregularly irregular.  S1 and S2 were soft.  There was 3/6 systolic murmur and soft S3 gallop. ABDOMEN:  Soft.  Bowel sounds were present.  Nontender. EXTREMITIES:  There is no clubbing, cyanosis, or edema.  LABORATORY DATA:  Sodium was 136, potassium 3.7, glucose 76, BUN 11, creatinine 0.76.  BNP was 1173.  Troponin I 2 sets were normal. Hemoglobin was 14.3, hematocrit 40.4, white count of 7.0.  BRIEF HOSPITAL COURSE:  The patient was admitted to telemetry unit.  The patient did receive 1 dose of Xarelto in the ED and which was switched to IV heparin and Coumadin in view of valvular atrial fibrillation.  The patient initially opted for rate control  and chronic anticoagulation and subsequently changed his mind and agreed for TEE assisted cardioversion. The patient successfully underwent TEE assisted cardioversion by Dr. Algie Coffer.  The patient has remained in sinus rhythm.  His INR is in therapeutic range today at 2.12.  The patient has been ambulating in room without any problems.  The patient also had MRI of the heart, which showed noncompaction cardiomyopathy.  Transthoracic echo showed mildly depressed LV systolic function with moderate mitral regurgitation directed posteriorly and mild tricuspid regurgitation.  Cardiac MRI showed marked depressed LV systolic function of LVEF of  34%, and there was diffuse hypokinesia.  Right ventricular ejection fraction was 42%. There were significant trabeculations in the left ventricular apex with noncompacted to compacted myocardium.  This is consistent with noncompaction cardiomyopathy.  The patient had occasional episodes of atrial premature contractions.  The patient was placed on amiodarone and the patient has remained in sinus rhythm.  The patient will be discharged home on above medications and will be followed up in my office in 1 week.  If he continues to have significant arrhythmias of V- tach, we will refer him for EP evaluation for consideration for ICD.     Eduardo Osier. Sharyn Lull, M.D.     MNH/MEDQ  D:  08/21/2017  T:  08/21/2017  Job:  [id number]

## 2017-08-31 ENCOUNTER — Other Ambulatory Visit: Payer: Self-pay

## 2017-08-31 ENCOUNTER — Ambulatory Visit (HOSPITAL_COMMUNITY)
Admission: EM | Admit: 2017-08-31 | Discharge: 2017-08-31 | Disposition: A | Discharge status: Home / Self Care | Payer: Medicaid Other | Attending: Family Medicine | Admitting: Family Medicine

## 2017-08-31 ENCOUNTER — Encounter (HOSPITAL_COMMUNITY): Payer: Self-pay | Admitting: Emergency Medicine

## 2017-08-31 DIAGNOSIS — K4090 Unilateral inguinal hernia, without obstruction or gangrene, not specified as recurrent: Secondary | ICD-10-CM

## 2017-08-31 NOTE — Discharge Instructions (Signed)
Please return here or to the Emergency Department immediately should you feel worse in any way or have any of the following symptoms: increasing or different pain, persistent vomiting, fevers, or shaking chills. Please call as soon as possible for a general surgery appointment.

## 2017-08-31 NOTE — ED Provider Notes (Signed)
[facility]   [id number] 08/31/17 Arrival Time: 1000  ASSESSMENT & PLAN:  1. Non-recurrent unilateral inguinal hernia without obstruction or gangrene    Tylenol as needed. To schedule f/u with general surgeon; information given. May f/u here as needed or the ED if abrupt worsening of his symptoms. Work note given.  Reviewed expectations re: course of current medical issues. Questions answered. Outlined signs and symptoms indicating need for more acute intervention. Patient verbalized understanding. After Visit Summary given.   SUBJECTIVE:  Gary [name] is a 42 y.o. male who presents with complaint of a possible R-sided inguinal hernia. H/O hernia on the L that required surgical intervention; many years ago. Has noticed a bulging of R groin for the past few months. Intermittent discomfort. More uncomfortable over the past few weeks. No specific aggravating or alleviating factors reported. No scrotal swelling or pain. Normal urination. Discomfort improved with sitting and resting. No abdominal pain/n/v. Afebrile. Normal PO intake. Ambulatory without difficulty. No OTC treatment.  Pt is on warfarin secondary to chronic A. Fib.  Past Surgical History:  Procedure Laterality Date  . ABDOMINAL SURGERY    . CARDIAC SURGERY    . CARDIOVERSION N/A 08/17/2017   Procedure: CARDIOVERSION;  Surgeon: Orpah Cobb, MD;  Location: [facility] ENDOSCOPY;  Service: Cardiovascular;  Laterality: N/A;  . HERNIA REPAIR    . TEE WITHOUT CARDIOVERSION N/A 08/17/2017   Procedure: TRANSESOPHAGEAL ECHOCARDIOGRAM (TEE);  Surgeon: Orpah Cobb, MD;  Location: [facility] ENDOSCOPY;  Service: Cardiovascular;  Laterality: N/A;    ROS: As per HPI.  OBJECTIVE:  Vitals:   08/31/17 1011  BP: 101/64  Pulse: (!) 59  Temp: 97.9 F (36.6 C)  TempSrc: Oral  SpO2: 96%    General appearance: alert; no distress Abdomen: soft; non-distended; no tenderness; bowel sounds present; no masses or organomegaly; no guarding  or rebound tenderness; R inguinal area with a slight bulge measuring 4 cm x 2 cm; slight tenderness to palpation; limited exam since he does not want me pushing on this; unable to determine if this is easily reducible; no overlying skin changes or swelling GU: normal exam without scrotal swelling Extremities: no edema; symmetrical with no gross deformities Skin: warm and dry Neurologic: normal gait Psychological: alert and cooperative; normal mood and affect  Allergies  Allergen Reactions  . Aspirin     Internal bleeding                                                Past Medical History:  Diagnosis Date  . Murmur   . Smoker    Social History   Socioeconomic History  . Marital status: Single    Spouse name: Not on file  . Number of children: Not on file  . Years of education: Not on file  . Highest education level: Not on file  Social Needs  . Financial resource strain: Not on file  . Food insecurity - worry: Not on file  . Food insecurity - inability: Not on file  . Transportation needs - medical: Not on file  . Transportation needs - non-medical: Not on file  Occupational History  . Not on file  Tobacco Use  . Smoking status: Current Every Day Smoker    Packs/day: 0.50  . Smokeless tobacco: Never Used  Substance and Sexual Activity  . Alcohol use: Yes  Comment: social  . Drug use: Yes    Types: Marijuana  . Sexual activity: Not on file  Other Topics Concern  . Not on file  Social History Narrative  . Not on file   History reviewed. No pertinent family history.   Mardella Layman, MD 08/31/17 1218

## 2017-08-31 NOTE — ED Triage Notes (Signed)
Pt reports RLQ pain that feels just like the hernia pain he had in his LUQ a few years ago.  Pt is on coumadin and doesn't know what he should do.  Pt does not have a PCP.

## 2017-09-05 ENCOUNTER — Ambulatory Visit: Payer: Self-pay | Admitting: General Surgery

## 2017-10-06 NOTE — Pre-Procedure Instructions (Signed)
Gary [name]  10/06/2017      [id number] - [place], [place] - [address] [place]  [id number] Phone: [phone] Fax: [phone]    Your procedure is scheduled on Friday April 5.  Report to [facility] Admitting at 10:15 A.M.  Call this number if you have problems the morning of surgery:  [phone]   Remember:  Do not eat food or drink liquids after midnight.  Take these medicines the morning of surgery with A SIP OF WATER:   Amiodarone (pacerone) Tramadol (ultram) if needed  7 days prior to surgery STOP taking any Aspirin(unless otherwise instructed by your surgeon), Aleve, Naproxen, Ibuprofen, Motrin, Advil, Goody's, BC's, all herbal medications, fish oil, and all vitamins  **FOLLOW your surgeon's instructions on stopping Coumadin (warfarin). If no instructions were given, please call your surgeon's office**   Do not wear jewelry, make-up or nail polish.  Do not wear lotions, powders, or perfumes, or deodorant.  Do not shave 48 hours prior to surgery.  Men may shave face and neck.  Do not bring valuables to the hospital.  [facility] is not responsible for any belongings or valuables.  Contacts, dentures or bridgework may not be worn into surgery.  Leave your suitcase in the car.  After surgery it may be brought to your room.  For patients admitted to the hospital, discharge time will be determined by your treatment team.  Patients discharged the day of surgery will not be allowed to drive home.    Special instructions:    [place]- Preparing For Surgery  Before surgery, you can play an important role. Because skin is not sterile, your skin needs to be as free of germs as possible. You can reduce the number of germs on your skin by washing with CHG (chlorahexidine gluconate) Soap before surgery.  CHG is an antiseptic cleaner which kills germs and  bonds with the skin to continue killing germs even after washing.  Please do not use if you have an allergy to CHG or antibacterial soaps. If your skin becomes reddened/irritated stop using the CHG.  Do not shave (including legs and underarms) for at least 48 hours prior to first CHG shower. It is OK to shave your face.  Please follow these instructions carefully.   1. Shower the NIGHT BEFORE SURGERY and the MORNING OF SURGERY with CHG.   2. If you chose to wash your hair, wash your hair first as usual with your normal shampoo.  3. After you shampoo, rinse your hair and body thoroughly to remove the shampoo.  4. Use CHG as you would any other liquid soap. You can apply CHG directly to the skin and wash gently with a scrungie or a clean washcloth.   5. Apply the CHG Soap to your body ONLY FROM THE NECK DOWN.  Do not use on open wounds or open sores. Avoid contact with your eyes, ears, mouth and genitals (private parts). Wash Face and genitals (private parts)  with your normal soap.  6. Wash thoroughly, paying special attention to the area where your surgery will be performed.  7. Thoroughly rinse your body with warm water from the neck down.  8. DO NOT shower/wash with your normal soap after using and rinsing off the CHG Soap.  9. Pat yourself dry with a CLEAN TOWEL.  10. Wear CLEAN PAJAMAS to bed the night before surgery,  wear comfortable clothes the morning of surgery  11. Place CLEAN SHEETS on your bed the night of your first shower and DO NOT SLEEP WITH PETS.    Day of Surgery: Do not apply any deodorants/lotions. Please wear clean clothes to the hospital/surgery center.      Please read over the following fact sheets that you were given. Coughing and Deep Breathing and Surgical Site Infection Prevention

## 2017-10-09 ENCOUNTER — Encounter (HOSPITAL_COMMUNITY)
Admission: RE | Admit: 2017-10-09 | Discharge: 2017-10-09 | Disposition: A | Discharge status: Home / Self Care | Payer: Medicaid Other | Source: Ambulatory Visit | Attending: General Surgery | Admitting: General Surgery

## 2017-10-09 ENCOUNTER — Encounter (HOSPITAL_COMMUNITY): Payer: Self-pay

## 2017-10-09 ENCOUNTER — Other Ambulatory Visit: Payer: Self-pay

## 2017-10-09 DIAGNOSIS — Z01812 Encounter for preprocedural laboratory examination: Secondary | ICD-10-CM | POA: Insufficient documentation

## 2017-10-09 HISTORY — DX: Cardiac arrhythmia, unspecified: I49.9

## 2017-10-09 HISTORY — DX: Heart failure, unspecified: I50.9

## 2017-10-09 HISTORY — DX: Hemorrhage, not elsewhere classified: R58

## 2017-10-09 LAB — BASIC METABOLIC PANEL
ANION GAP: 7 (ref 5–15)
BUN: 12 mg/dL (ref 6–20)
CO2: 24 mmol/L (ref 22–32)
Calcium: 9 mg/dL (ref 8.9–10.3)
Chloride: 109 mmol/L (ref 101–111)
Creatinine, Ser: 0.99 mg/dL (ref 0.61–1.24)
GFR calc non Af Amer: >60 mL/min (ref >60)
Glucose, Bld: 94 mg/dL (ref 65–99)
POTASSIUM: 4 mmol/L (ref 3.5–5.1)
SODIUM: 140 mmol/L (ref 135–145)

## 2017-10-09 LAB — CBC WITH DIFFERENTIAL/PLATELET
BASOS ABS: 0 10*3/uL (ref 0.0–0.1)
Basophils Relative: 0 %
Eosinophils Absolute: 0.2 10*3/uL (ref 0.0–0.7)
Eosinophils Relative: 2 %
HCT: 37.6 % — ABNORMAL LOW (ref 39.0–52.0)
HEMOGLOBIN: 12.3 g/dL — AB (ref 13.0–17.0)
LYMPHS ABS: 1.1 10*3/uL (ref 0.7–4.0)
LYMPHS PCT: 13 %
MCH: 30.5 pg (ref 26.0–34.0)
MCHC: 32.7 g/dL (ref 30.0–36.0)
MCV: 93.3 fL (ref 78.0–100.0)
Monocytes Absolute: 0.7 10*3/uL (ref 0.1–1.0)
Monocytes Relative: 8 %
NEUTROS ABS: 6.5 10*3/uL (ref 1.7–7.7)
NEUTROS PCT: 77 %
Platelets: 172 10*3/uL (ref 150–400)
RBC: 4.03 MIL/uL — AB (ref 4.22–5.81)
RDW: 12.9 % (ref 11.5–15.5)
WBC: 8.5 10*3/uL (ref 4.0–10.5)

## 2017-10-09 LAB — PROTIME-INR
INR: 2.12
Prothrombin Time: 23.6 seconds — ABNORMAL HIGH (ref 11.4–15.2)

## 2017-10-09 NOTE — Progress Notes (Signed)
PCP: none Cardiologist: Dr. George Hugh last notes/any studies  Pt. Reported Dr. Lindie Spruce instructed him to stop coumadin 10/07/17.

## 2017-10-10 NOTE — Progress Notes (Signed)
Anesthesia chart review:  Patient is a 42 year old male scheduled for open R inguinal hernia repair, insertion of mesh on 10/13/2017 with Jimmye Norman, MD  - Cardiologist is Rinaldo Cloud, MD.  Patient cleared for surgery at last office visit 09/22/17  PMH includes: CHF, atrial fibrillation (s/p TEE assisted cardioversion 08/17/17), congenital heart disease (s/p open heart surgery as a child; ?PFO repair).  Former smoker.  BMI 20.  - Hospitalized 2/4-2/11/19 for acute CHF, new onset afib.   Medications include: Amiodarone, losartan, Coumadin.  Patient stopped Coumadin 10/07/17.  BP (!) 105/47   Pulse (!) 54   Temp 36.5 C   Resp 20   Ht 5\' 5"  (1.651 m)   Wt 119 lb 11.2 oz (54.3 kg)   SpO2 100%   BMI 19.92 kg/m   Preoperative labs reviewed.   - PT 23.6. Will repeat day of surgery  CXR 08/14/17: Cardiomegaly. Chronic linear densities and interstitial prominence, likely scarring.  EKG 09/22/17 (Dr. Annitta Jersey office): [place] sinus bradycardia (49 bpm) with first-degree AV block.  Voltage criteria for LVH.  ST and T wave abnormality, consider inferior lateral ischemia.  Prolonged QT (QTc 459; Dr. Sharyn Lull decreased amiodarone)  TEE 08/17/17:  1. 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 2. Trace AI, mild PI, moderate MR and TR. 3. No PFO or thrombus  If labs acceptable day of surgery, I anticipate pt can proceed with surgery as scheduled.   Rica Mast, FNP-BC [facility] Short Stay Surgical Center/Anesthesiology Phone: [phone] 10/10/2017 10:40 AM

## 2017-10-12 NOTE — H&P (Signed)
Abner Yankovich Documented: 09/05/2017 8:58 AM Location: [facility] Patient #: [id number] DOB: 01-10-1976 Single / Language: [place] / Race: Black or African American Male   History of Present Illness Marta Lamas. Lindie Spruce MD; 09/05/2017 9:52 AM) Patient words: I have a painful hernia.  The patient is a 42 year old male who presents with an inguinal hernia. The hernia(s) is/are located on the right side. No changes in management were made at the last visit. Symptoms include inguinal bulge, inguinal pain and scrotal pain. The pain is located in the right inguinal area. The patient describes the pain as aching and throbbing. Onset was sudden. Onset followed lifting. The symptoms occur constantly. The episodes occur daily. The patient describes this as moderate in severity and worsening. Symptoms are exacerbated by lifting.   Allergies (Danielle Gerrigner, CMA; 09/05/2017 9:05 AM) Aspirin 81 *ANALGESICS - NonNarcotic*  Allergies Reconciled   Medication History (Danielle Gerrigner, CMA; 09/05/2017 9:06 AM) Losartan Potassium (Oral) Specific strength unknown - Active. Coumadin (Oral) Specific strength unknown - Active. Medications Reconciled  Vitals (Danielle Gerrigner CMA; 09/05/2017 9:06 AM) 09/05/2017 9:06 AM Weight: 121.38 lb Height: 66in Body Surface Area: 1.62 m Body Mass Index: 19.59 kg/m  Temp.: 97.19F(Oral)  Pulse: 68 (Regular)  BP: 120/70 (Sitting, Left Arm, Standard)       Physical Exam (Joana Nolton O. Lindie Spruce MD; 09/05/2017 9:59 AM) General Mental Status-Alert. General Appearance-Cooperative, Well groomed and Consistent with stated age. Orientation-Oriented X4. Build & Nutrition-Lean. Health Status-General Health Good. Note: Prior heart surgery as a child  Chest and Lung Exam Chest and lung exam reveals -normal excursion with symmetric chest walls, normal resonance, no flatness or dullness, non-tender and normal tactile fremitus and on  auscultation, normal breath sounds, no adventitious sounds and normal vocal resonance. Note: Easily reducible RIH, very tender.    Cardiovascular Auscultation Rhythm - Regular. Heart Sounds - Normal heart sounds. Murmurs & Other Heart Sounds: Murmur 1 - Location - Sternal Border - Left. Grade - II/VI. Radiation - Carotids.  Abdomen Inspection Hernias - Inguinal hernia - Right - Reducible(Easily reducible but tender.). No change in hernia.   Assessment & Plan Fayrene Fearing O. Khriz Liddy MD; 09/05/2017 10:04 AM) RIGHT INGUINAL HERNIA (K40.90) Impression: Symptomatic, reducible. For elective repair. Will RX tramadol for pain preop. Plan on open surgical repair after clearance by cardiology Current Plans:  INR is normal Plan on open repair with mesh.  Marta Lamas. Gae Bon, MD, FACS [phone] [facility]

## 2017-10-13 ENCOUNTER — Other Ambulatory Visit: Payer: Self-pay

## 2017-10-13 ENCOUNTER — Encounter (HOSPITAL_COMMUNITY)
Admission: RE | Disposition: A | Discharge status: Home / Self Care | Payer: Self-pay | Source: Ambulatory Visit | Attending: General Surgery

## 2017-10-13 ENCOUNTER — Encounter (HOSPITAL_COMMUNITY): Payer: Self-pay | Admitting: *Deleted

## 2017-10-13 ENCOUNTER — Inpatient Hospital Stay (HOSPITAL_COMMUNITY)
Admission: RE | Admit: 2017-10-13 | Discharge: 2017-10-18 | DRG: 329 | Disposition: A | Discharge status: Home / Self Care | Payer: Medicaid Other | Source: Ambulatory Visit | Attending: General Surgery | Admitting: General Surgery

## 2017-10-13 ENCOUNTER — Ambulatory Visit (HOSPITAL_COMMUNITY): Payer: Medicaid Other | Admitting: Emergency Medicine

## 2017-10-13 ENCOUNTER — Ambulatory Visit (HOSPITAL_COMMUNITY): Payer: Medicaid Other | Admitting: Anesthesiology

## 2017-10-13 DIAGNOSIS — K567 Ileus, unspecified: Secondary | ICD-10-CM | POA: Diagnosis not present

## 2017-10-13 DIAGNOSIS — R52 Pain, unspecified: Secondary | ICD-10-CM

## 2017-10-13 DIAGNOSIS — S36409A Unspecified injury of unspecified part of small intestine, initial encounter: Secondary | ICD-10-CM | POA: Diagnosis not present

## 2017-10-13 DIAGNOSIS — K4030 Unilateral inguinal hernia, with obstruction, without gangrene, not specified as recurrent: Principal | ICD-10-CM | POA: Diagnosis present

## 2017-10-13 DIAGNOSIS — Z87891 Personal history of nicotine dependence: Secondary | ICD-10-CM

## 2017-10-13 DIAGNOSIS — E43 Unspecified severe protein-calorie malnutrition: Secondary | ICD-10-CM

## 2017-10-13 DIAGNOSIS — K9171 Accidental puncture and laceration of a digestive system organ or structure during a digestive system procedure: Secondary | ICD-10-CM | POA: Diagnosis not present

## 2017-10-13 DIAGNOSIS — K9189 Other postprocedural complications and disorders of digestive system: Secondary | ICD-10-CM | POA: Diagnosis not present

## 2017-10-13 DIAGNOSIS — Z681 Body mass index (BMI) 19 or less, adult: Secondary | ICD-10-CM

## 2017-10-13 DIAGNOSIS — Z6820 Body mass index (BMI) 20.0-20.9, adult: Secondary | ICD-10-CM

## 2017-10-13 DIAGNOSIS — Y92234 Operating room of hospital as the place of occurrence of the external cause: Secondary | ICD-10-CM | POA: Diagnosis not present

## 2017-10-13 DIAGNOSIS — Z7901 Long term (current) use of anticoagulants: Secondary | ICD-10-CM

## 2017-10-13 DIAGNOSIS — K565 Intestinal adhesions [bands], unspecified as to partial versus complete obstruction: Secondary | ICD-10-CM | POA: Diagnosis present

## 2017-10-13 DIAGNOSIS — Z886 Allergy status to analgesic agent status: Secondary | ICD-10-CM

## 2017-10-13 HISTORY — DX: Unilateral inguinal hernia, with obstruction, without gangrene, not specified as recurrent: K40.30

## 2017-10-13 HISTORY — PX: INGUINAL HERNIA REPAIR: SHX194

## 2017-10-13 LAB — PROTIME-INR
INR: 1.1
PROTHROMBIN TIME: 14.1 s (ref 11.4–15.2)

## 2017-10-13 SURGERY — REPAIR, HERNIA, INGUINAL, ADULT
Anesthesia: General | Laterality: Right

## 2017-10-13 MED ORDER — GLYCOPYRROLATE 0.2 MG/ML IJ SOLN
INTRAMUSCULAR | Status: DC | PRN
  Administered 2017-10-13: 0.2 mg via INTRAVENOUS

## 2017-10-13 MED ORDER — GABAPENTIN 300 MG PO CAPS
ORAL_CAPSULE | ORAL | Status: AC
  Administered 2017-10-13: 300 mg via ORAL
  Filled 2017-10-13: qty 1

## 2017-10-13 MED ORDER — ONDANSETRON HCL 4 MG/2ML IJ SOLN
INTRAMUSCULAR | Status: AC
  Filled 2017-10-13: qty 2

## 2017-10-13 MED ORDER — LIDOCAINE HCL (CARDIAC) 20 MG/ML IV SOLN
INTRAVENOUS | Status: DC | PRN
  Administered 2017-10-13: 60 mg via INTRAVENOUS

## 2017-10-13 MED ORDER — LIDOCAINE HCL (CARDIAC) 20 MG/ML IV SOLN
INTRAVENOUS | Status: AC
  Filled 2017-10-13: qty 10

## 2017-10-13 MED ORDER — GABAPENTIN 300 MG PO CAPS
300.0000 mg | ORAL_CAPSULE | ORAL | Status: AC
  Administered 2017-10-13: 300 mg via ORAL

## 2017-10-13 MED ORDER — EPHEDRINE SULFATE 50 MG/ML IJ SOLN
INTRAMUSCULAR | Status: DC | PRN
  Administered 2017-10-13: 10 mg via INTRAVENOUS
  Administered 2017-10-13 (×2): 5 mg via INTRAVENOUS
  Administered 2017-10-13 (×2): 10 mg via INTRAVENOUS

## 2017-10-13 MED ORDER — LOSARTAN POTASSIUM 50 MG PO TABS
25.0000 mg | ORAL_TABLET | Freq: Every day | ORAL | Status: DC
  Administered 2017-10-13 – 2017-10-18 (×6): 25 mg via ORAL
  Filled 2017-10-13 (×6): qty 1

## 2017-10-13 MED ORDER — FENTANYL CITRATE (PF) 100 MCG/2ML IJ SOLN
50.0000 ug | Freq: Once | INTRAMUSCULAR | Status: AC
  Administered 2017-10-13: 50 ug via INTRAVENOUS

## 2017-10-13 MED ORDER — CHLORHEXIDINE GLUCONATE CLOTH 2 % EX PADS: 6.0000 | MEDICATED_PAD | Freq: Once | CUTANEOUS | Status: DC

## 2017-10-13 MED ORDER — AMIODARONE HCL 200 MG PO TABS
200.0000 mg | ORAL_TABLET | Freq: Every day | ORAL | Status: DC
  Administered 2017-10-14 – 2017-10-18 (×5): 200 mg via ORAL
  Filled 2017-10-13 (×5): qty 1

## 2017-10-13 MED ORDER — SODIUM CHLORIDE 0.9 % IV SOLN
INTRAVENOUS | Status: AC
  Filled 2017-10-13: qty 500000

## 2017-10-13 MED ORDER — ONDANSETRON 4 MG PO TBDP: 4.0000 mg | ORAL_TABLET | Freq: Four times a day (QID) | ORAL | Status: DC | PRN

## 2017-10-13 MED ORDER — KCL IN DEXTROSE-NACL 20-5-0.45 MEQ/L-%-% IV SOLN
INTRAVENOUS | Status: AC
  Administered 2017-10-13: 75 mL/h via INTRAVENOUS
  Filled 2017-10-13: qty 1000

## 2017-10-13 MED ORDER — MIDAZOLAM HCL 2 MG/2ML IJ SOLN
2.0000 mg | Freq: Once | INTRAMUSCULAR | Status: AC
  Administered 2017-10-13: 2 mg via INTRAVENOUS

## 2017-10-13 MED ORDER — SUCCINYLCHOLINE CHLORIDE 200 MG/10ML IV SOSY
PREFILLED_SYRINGE | INTRAVENOUS | Status: AC
  Filled 2017-10-13: qty 10

## 2017-10-13 MED ORDER — MIDAZOLAM HCL 2 MG/2ML IJ SOLN
INTRAMUSCULAR | Status: AC
  Administered 2017-10-13: 2 mg via INTRAVENOUS
  Filled 2017-10-13: qty 2

## 2017-10-13 MED ORDER — ACETAMINOPHEN 500 MG PO TABS
ORAL_TABLET | ORAL | Status: AC
  Administered 2017-10-13: 1000 mg via ORAL
  Filled 2017-10-13: qty 2

## 2017-10-13 MED ORDER — OXYCODONE HCL 5 MG PO TABS
5.0000 mg | ORAL_TABLET | ORAL | Status: DC | PRN
  Administered 2017-10-14 – 2017-10-15 (×5): 10 mg via ORAL
  Filled 2017-10-13 (×6): qty 2
  Filled 2017-10-13: qty 1

## 2017-10-13 MED ORDER — LACTATED RINGERS IV SOLN
INTRAVENOUS | Status: DC
  Administered 2017-10-13 (×2): via INTRAVENOUS

## 2017-10-13 MED ORDER — HYDROCODONE-ACETAMINOPHEN 7.5-325 MG PO TABS: 1.0000 | ORAL_TABLET | Freq: Once | ORAL | Status: DC | PRN

## 2017-10-13 MED ORDER — PROPOFOL 10 MG/ML IV BOLUS
INTRAVENOUS | Status: AC
  Filled 2017-10-13: qty 20

## 2017-10-13 MED ORDER — PROMETHAZINE HCL 25 MG/ML IJ SOLN: 6.2500 mg | INTRAMUSCULAR | Status: DC | PRN

## 2017-10-13 MED ORDER — CEFAZOLIN SODIUM-DEXTROSE 2-4 GM/100ML-% IV SOLN
2.0000 g | INTRAVENOUS | Status: AC
  Administered 2017-10-13: 2 g via INTRAVENOUS

## 2017-10-13 MED ORDER — HYDROMORPHONE HCL 1 MG/ML IJ SOLN: 0.2500 mg | INTRAMUSCULAR | Status: DC | PRN

## 2017-10-13 MED ORDER — KCL IN DEXTROSE-NACL 20-5-0.45 MEQ/L-%-% IV SOLN
INTRAVENOUS | Status: DC
  Administered 2017-10-13: 75 mL/h via INTRAVENOUS
  Administered 2017-10-14 – 2017-10-16 (×3): via INTRAVENOUS
  Administered 2017-10-17: 1 mL via INTRAVENOUS
  Filled 2017-10-13 (×6): qty 1000

## 2017-10-13 MED ORDER — BUPIVACAINE-EPINEPHRINE (PF) 0.25% -1:200000 IJ SOLN
INTRAMUSCULAR | Status: AC
  Filled 2017-10-13: qty 30

## 2017-10-13 MED ORDER — ENSURE ENLIVE PO LIQD
237.0000 mL | Freq: Three times a day (TID) | ORAL | Status: DC
  Administered 2017-10-13 – 2017-10-14 (×3): 237 mL via ORAL

## 2017-10-13 MED ORDER — CEFAZOLIN SODIUM-DEXTROSE 2-4 GM/100ML-% IV SOLN
2.0000 g | Freq: Three times a day (TID) | INTRAVENOUS | Status: AC
  Administered 2017-10-13: 2 g via INTRAVENOUS
  Filled 2017-10-13: qty 100

## 2017-10-13 MED ORDER — ENOXAPARIN SODIUM 40 MG/0.4ML ~~LOC~~ SOLN
40.0000 mg | SUBCUTANEOUS | Status: DC
  Administered 2017-10-14 – 2017-10-18 (×5): 40 mg via SUBCUTANEOUS
  Filled 2017-10-13 (×5): qty 0.4

## 2017-10-13 MED ORDER — ACETAMINOPHEN 10 MG/ML IV SOLN: 1000.0000 mg | Freq: Once | INTRAVENOUS | Status: DC | PRN

## 2017-10-13 MED ORDER — FENTANYL CITRATE (PF) 100 MCG/2ML IJ SOLN
INTRAMUSCULAR | Status: DC | PRN
  Administered 2017-10-13: 100 ug via INTRAVENOUS
  Administered 2017-10-13 (×2): 50 ug via INTRAVENOUS
  Administered 2017-10-13 (×2): 25 ug via INTRAVENOUS

## 2017-10-13 MED ORDER — SODIUM CHLORIDE 0.9 % IV SOLN
INTRAVENOUS | Status: DC | PRN
  Administered 2017-10-13: 500 mL

## 2017-10-13 MED ORDER — FENTANYL CITRATE (PF) 100 MCG/2ML IJ SOLN
INTRAMUSCULAR | Status: AC
  Administered 2017-10-13: 50 ug via INTRAVENOUS
  Filled 2017-10-13: qty 2

## 2017-10-13 MED ORDER — MIDAZOLAM HCL 2 MG/2ML IJ SOLN
INTRAMUSCULAR | Status: AC
  Filled 2017-10-13: qty 2

## 2017-10-13 MED ORDER — ACETAMINOPHEN 500 MG PO TABS
1000.0000 mg | ORAL_TABLET | Freq: Four times a day (QID) | ORAL | Status: DC
  Administered 2017-10-13 – 2017-10-17 (×12): 1000 mg via ORAL
  Filled 2017-10-13 (×17): qty 2

## 2017-10-13 MED ORDER — EPHEDRINE 5 MG/ML INJ
INTRAVENOUS | Status: AC
  Filled 2017-10-13: qty 30

## 2017-10-13 MED ORDER — BUPIVACAINE-EPINEPHRINE 0.5% -1:200000 IJ SOLN
INTRAMUSCULAR | Status: DC | PRN
  Administered 2017-10-13: 30 mL

## 2017-10-13 MED ORDER — PROPOFOL 10 MG/ML IV BOLUS
INTRAVENOUS | Status: DC | PRN
  Administered 2017-10-13: 150 mg via INTRAVENOUS

## 2017-10-13 MED ORDER — PANTOPRAZOLE SODIUM 40 MG IV SOLR
40.0000 mg | Freq: Every day | INTRAVENOUS | Status: DC
  Administered 2017-10-13: 40 mg via INTRAVENOUS
  Filled 2017-10-13: qty 40

## 2017-10-13 MED ORDER — BUPIVACAINE-EPINEPHRINE (PF) 0.5% -1:200000 IJ SOLN
INTRAMUSCULAR | Status: AC
  Filled 2017-10-13: qty 30

## 2017-10-13 MED ORDER — 0.9 % SODIUM CHLORIDE (POUR BTL) OPTIME
TOPICAL | Status: DC | PRN
  Administered 2017-10-13: 1000 mL

## 2017-10-13 MED ORDER — ONDANSETRON HCL 4 MG/2ML IJ SOLN
4.0000 mg | Freq: Four times a day (QID) | INTRAMUSCULAR | Status: DC | PRN
  Administered 2017-10-16: 4 mg via INTRAVENOUS
  Filled 2017-10-13: qty 2

## 2017-10-13 MED ORDER — ACETAMINOPHEN 500 MG PO TABS
1000.0000 mg | ORAL_TABLET | ORAL | Status: AC
  Administered 2017-10-13: 1000 mg via ORAL

## 2017-10-13 MED ORDER — CEFAZOLIN SODIUM-DEXTROSE 2-4 GM/100ML-% IV SOLN
INTRAVENOUS | Status: AC
  Filled 2017-10-13: qty 100

## 2017-10-13 MED ORDER — ONDANSETRON HCL 4 MG/2ML IJ SOLN
INTRAMUSCULAR | Status: DC | PRN
  Administered 2017-10-13: 4 mg via INTRAVENOUS

## 2017-10-13 MED ORDER — METHOCARBAMOL 500 MG PO TABS
500.0000 mg | ORAL_TABLET | Freq: Four times a day (QID) | ORAL | Status: DC | PRN
  Administered 2017-10-13 – 2017-10-16 (×3): 500 mg via ORAL
  Filled 2017-10-13 (×4): qty 1

## 2017-10-13 MED ORDER — FENTANYL CITRATE (PF) 250 MCG/5ML IJ SOLN
INTRAMUSCULAR | Status: AC
  Filled 2017-10-13: qty 5

## 2017-10-13 MED ORDER — TRAMADOL HCL 50 MG PO TABS
50.0000 mg | ORAL_TABLET | ORAL | Status: DC | PRN
  Administered 2017-10-13: 50 mg via ORAL
  Filled 2017-10-13: qty 1

## 2017-10-13 MED ORDER — BUPIVACAINE HCL (PF) 0.25 % IJ SOLN
INTRAMUSCULAR | Status: AC
  Filled 2017-10-13: qty 30

## 2017-10-13 MED ORDER — HYDROMORPHONE HCL 1 MG/ML IJ SOLN: 0.5000 mg | INTRAMUSCULAR | Status: DC | PRN

## 2017-10-13 MED ORDER — MEPERIDINE HCL 50 MG/ML IJ SOLN: 6.2500 mg | INTRAMUSCULAR | Status: DC | PRN

## 2017-10-13 MED ORDER — SUGAMMADEX SODIUM 200 MG/2ML IV SOLN
INTRAVENOUS | Status: AC
  Filled 2017-10-13: qty 2

## 2017-10-13 MED ORDER — BUPIVACAINE-EPINEPHRINE (PF) 0.5% -1:200000 IJ SOLN
INTRAMUSCULAR | Status: DC | PRN
  Administered 2017-10-13: 20 mL via PERINEURAL

## 2017-10-13 SURGICAL SUPPLY — 58 items
BAG DECANTER FOR FLEXI CONT (MISCELLANEOUS) ×3 IMPLANT
BLADE CLIPPER SURG (BLADE) ×3 IMPLANT
BLADE SURG 10 STRL SS (BLADE) ×3 IMPLANT
BLADE SURG 15 STRL LF DISP TIS (BLADE) ×1 IMPLANT
BLADE SURG 15 STRL SS (BLADE) ×2
CANISTER SUCT 3000ML PPV (MISCELLANEOUS) ×3 IMPLANT
CHLORAPREP W/TINT 26ML (MISCELLANEOUS) ×3 IMPLANT
CLEANER TIP ELECTROSURG 2X2 (MISCELLANEOUS) IMPLANT
CLOSURE WOUND 1/2 X4 (GAUZE/BANDAGES/DRESSINGS) ×1
COVER SURGICAL LIGHT HANDLE (MISCELLANEOUS) ×3 IMPLANT
DERMABOND ADVANCED (GAUZE/BANDAGES/DRESSINGS) ×2
DERMABOND ADVANCED .7 DNX12 (GAUZE/BANDAGES/DRESSINGS) ×1 IMPLANT
DRAIN PENROSE 1/2X12 LTX STRL (WOUND CARE) ×3 IMPLANT
DRAPE LAPAROTOMY TRNSV 102X78 (DRAPE) ×3 IMPLANT
DRAPE UTILITY XL STRL (DRAPES) ×3 IMPLANT
DRSG TEGADERM 2-3/8X2-3/4 SM (GAUZE/BANDAGES/DRESSINGS) ×3 IMPLANT
DRSG TEGADERM 4X4.75 (GAUZE/BANDAGES/DRESSINGS) ×3 IMPLANT
ELECT CAUTERY BLADE 6.4 (BLADE) ×3 IMPLANT
ELECT REM PT RETURN 9FT ADLT (ELECTROSURGICAL) ×3
ELECTRODE REM PT RTRN 9FT ADLT (ELECTROSURGICAL) ×1 IMPLANT
GAUZE SPONGE 4X4 16PLY XRAY LF (GAUZE/BANDAGES/DRESSINGS) ×3 IMPLANT
GLOVE BIOGEL PI IND STRL 8 (GLOVE) ×1 IMPLANT
GLOVE BIOGEL PI INDICATOR 8 (GLOVE) ×2
GLOVE ECLIPSE 7.5 STRL STRAW (GLOVE) ×3 IMPLANT
GOWN STRL REUS W/ TWL LRG LVL3 (GOWN DISPOSABLE) ×2 IMPLANT
GOWN STRL REUS W/TWL LRG LVL3 (GOWN DISPOSABLE) ×4
KIT BASIN OR (CUSTOM PROCEDURE TRAY) ×3 IMPLANT
KIT TURNOVER KIT B (KITS) ×3 IMPLANT
NEEDLE HYPO 25GX1X1/2 BEV (NEEDLE) ×3 IMPLANT
NS IRRIG 1000ML POUR BTL (IV SOLUTION) ×3 IMPLANT
PACK SURGICAL SETUP 50X90 (CUSTOM PROCEDURE TRAY) ×3 IMPLANT
PAD ARMBOARD 7.5X6 YLW CONV (MISCELLANEOUS) ×3 IMPLANT
PENCIL BUTTON HOLSTER BLD 10FT (ELECTRODE) ×3 IMPLANT
RELOAD STAPLER LINEAR PROX 30 (STAPLE) ×1 IMPLANT
SPECIMEN JAR SMALL (MISCELLANEOUS) IMPLANT
SPONGE INTESTINAL PEANUT (DISPOSABLE) ×3 IMPLANT
SPONGE LAP 18X18 X RAY DECT (DISPOSABLE) ×3 IMPLANT
STAPLER GUN LINEAR PROX 60 (STAPLE) ×3 IMPLANT
STAPLER PROXIMATE 55 BLUE (STAPLE) ×3 IMPLANT
STAPLER RELOAD LINEAR PROX 30 (STAPLE) ×3
STRIP CLOSURE SKIN 1/2X4 (GAUZE/BANDAGES/DRESSINGS) ×2 IMPLANT
SUT ETHIBOND 0 MO6 C/R (SUTURE) ×3 IMPLANT
SUT MON AB 4-0 PC3 18 (SUTURE) ×3 IMPLANT
SUT NOVA NAB GS-21 0 18 T12 DT (SUTURE) ×6 IMPLANT
SUT PROLENE 0 CT 2 (SUTURE) ×3 IMPLANT
SUT SILK 3 0 SH CR/8 (SUTURE) ×3 IMPLANT
SUT VIC AB 3-0 SH 27 (SUTURE) ×4
SUT VIC AB 3-0 SH 27X BRD (SUTURE) ×2 IMPLANT
SUT VIC AB 3-0 SH 8-18 (SUTURE) ×3 IMPLANT
SUT VICRYL AB 3 0 TIES (SUTURE) IMPLANT
SYR BULB 3OZ (MISCELLANEOUS) ×3 IMPLANT
SYR CONTROL 10ML LL (SYRINGE) ×3 IMPLANT
TAPE STRIPS DRAPE STRL (GAUZE/BANDAGES/DRESSINGS) ×3 IMPLANT
TOWEL OR 17X24 6PK STRL BLUE (TOWEL DISPOSABLE) ×3 IMPLANT
TOWEL OR 17X26 10 PK STRL BLUE (TOWEL DISPOSABLE) ×3 IMPLANT
TUBE CONNECTING 12'X1/4 (SUCTIONS) ×1
TUBE CONNECTING 12X1/4 (SUCTIONS) ×2 IMPLANT
YANKAUER SUCT BULB TIP NO VENT (SUCTIONS) ×3 IMPLANT

## 2017-10-13 NOTE — Anesthesia Preprocedure Evaluation (Addendum)
Anesthesia Evaluation  Patient identified by MRN, date of birth, ID band Patient awake    Reviewed: Allergy & Precautions, H&P , NPO status , Patient's Chart, lab work & pertinent test results  Airway Mallampati: II  TM Distance: >3 FB Neck ROM: Full    Dental no notable dental hx.    Pulmonary former smoker,    Pulmonary exam normal breath sounds clear to auscultation       Cardiovascular Exercise Tolerance: Poor +CHF  Normal cardiovascular exam+ dysrhythmias Atrial Fibrillation + Valvular Problems/Murmurs MR  Rhythm:Regular Rate:Normal  Study Conclusions  - Left ventricle: Systolic function was mildly reduced. The   estimated ejection fraction was in the range of 45% to 50%. Diffuse hypokinesis. - Aortic valve: There was trivial regurgitation. - Mitral valve: There was moderate regurgitation directed centrally and anteriorly. - Left atrium: The atrium was moderately dilated. No evidence of thrombus in the atrial cavity or appendage. The appendage was morphologically a left appendage, multilobulated, and moderately dilated. Emptying velocity was normal. - Right atrium: The atrium was mildly dilated. No evidence of  thrombus in the atrial cavity or appendage. - Atrial septum: No defect or patent foramen ovale was identified. - Tricuspid valve: There was moderate regurgitation.   Neuro/Psych negative neurological ROS  negative psych ROS   GI/Hepatic negative GI ROS, Neg liver ROS,   Endo/Other  negative endocrine ROS  Renal/GU negative Renal ROS  negative genitourinary   Musculoskeletal negative musculoskeletal ROS (+)   Abdominal   Peds  Hematology  (+) Blood dyscrasia, anemia ,   Anesthesia Other Findings   Reproductive/Obstetrics                          Anesthesia Physical Anesthesia Plan  ASA: II  Anesthesia Plan: General   Post-op Pain Management:    Induction:  Intravenous  PONV Risk Score and Plan: Treatment may vary due to age or medical condition, Ondansetron and Dexamethasone  Airway Management Planned: Oral ETT  Additional Equipment:   Intra-op Plan:   Post-operative Plan: Extubation in OR  Informed Consent: I have reviewed the patients History and Physical, chart, labs and discussed the procedure including the risks, benefits and alternatives for the proposed anesthesia with the patient or authorized representative who has indicated his/her understanding and acceptance.   Dental advisory given  Plan Discussed with: CRNA  Anesthesia Plan Comments:         Anesthesia Quick Evaluation

## 2017-10-13 NOTE — Anesthesia Procedure Notes (Signed)
Anesthesia Regional Block: TAP block   Pre-Anesthetic Checklist: ,, timeout performed, Correct Patient, Correct Site, Correct Laterality, Correct Procedure, Correct Position, site marked, Risks and benefits discussed,  Surgical consent,  Pre-op evaluation,  At surgeon's request and post-op pain management  Laterality: Right  Prep: chloraprep       Needles:  Injection technique: Single-shot  Needle Type: Echogenic Needle     Needle Length: 9cm  Needle Gauge: 21     Additional Needles:   Procedures:,,,, ultrasound used (permanent image in chart),,,,  Narrative:  Start time: 10/13/2017 3:30 PM End time: 10/13/2017 3:35 PM Injection made incrementally with aspirations every 5 mL.  Performed by: Personally  Anesthesiologist: [name], MD  Additional Notes: No pain on injection. No increased resistance to injection. Injection made in 5cc increments.  Good needle visualization.  Patient tolerated procedure well.

## 2017-10-13 NOTE — Anesthesia Procedure Notes (Addendum)
Procedure Name: LMA Insertion Date/Time: 10/13/2017 4:10 PM Performed by: Filmore Molyneux T, CRNA Pre-anesthesia Checklist: Patient identified, Emergency Drugs available, Suction available and Patient being monitored Patient Re-evaluated:Patient Re-evaluated prior to induction Oxygen Delivery Method: Circle system utilized Preoxygenation: Pre-oxygenation with 100% oxygen Induction Type: IV induction Ventilation: Mask ventilation without difficulty LMA: LMA inserted LMA Size: 4.0

## 2017-10-13 NOTE — Progress Notes (Signed)
Received pt awake and alert, HR running 40-50's. Asymptomatic. Per PACU nurse, MD is aware, orders are put in. Wil monitor pt

## 2017-10-13 NOTE — Transfer of Care (Signed)
Immediate Anesthesia Transfer of Care Note  Patient: Gary [name]  Procedure(s) Performed: OPEN RIGHT INGUINAL HERNIA REPAIR ERAS PATHWAY (Right )  Patient Location: PACU  Anesthesia Type:General  Level of Consciousness: awake  Airway & Oxygen Therapy: Patient Spontanous Breathing  Post-op Assessment: Report given to RN and Post -op Vital signs reviewed and stable  Post vital signs: Reviewed and stable  Last Vitals:  Vitals Value Taken Time  BP 119/59 10/13/2017  6:10 PM  Temp 36.7 C 10/13/2017  6:10 PM  Pulse 49 10/13/2017  6:13 PM  Resp 13 10/13/2017  6:13 PM  SpO2 97 % 10/13/2017  6:13 PM  Vitals shown include unvalidated device data.  Last Pain:  Vitals:   10/13/17 1810  TempSrc:   PainSc: Asleep         Complications: No apparent anesthesia complications

## 2017-10-13 NOTE — Op Note (Signed)
OPERATIVE REPORT  DATE OF OPERATION: 10/13/2017  PATIENT:  Gary [name]  42 y.o. male  PRE-OPERATIVE DIAGNOSIS:  Symptomatic Right Inguinal Hernia  POST-OPERATIVE DIAGNOSIS:  Symptomatic Right Inguinal Hernia with incarcerated slider with loop of ileum/small bowel   INDICATION(S) FOR OPERATION:  Symptomatic right inguinal hernia  FINDINGS:  Loop of small bowel comprising the wall of the hernia sac in the right groin  PROCEDURE:  Procedure(s): OPEN RIGHT INGUINAL HERNIA REPAIR ERAS PATHWAY SMALL BOWEL RESECTION  SURGEON:  Surgeon(s): Jimmye Norman, MD  ASSISTANTLance Bosch, MS  ANESTHESIA:   general and LMA  COMPLICATIONS:  Small bowel injury/enterotomy  EBL: 50 ml  BLOOD ADMINISTERED: none  DRAINS: none   SPECIMEN:  Source of Specimen:  Resected small bowell  COUNTS CORRECT:  YES  PROCEDURE DETAILS: The patient was taken to the operating room and placed on the table in supine position.  After an adequate general laryngeal airway anesthetic was administered, he was prepped and draped in usual sterile manner exposing his right inguinal area.  A proper timeout was performed identifying the patient and the procedure to be performed.  We made a right lower quadrant transverse curvilinear incision approximately 6 cm long and taken down to and through the subcutaneous tissue to the external oblique fascia.  Once we had developed we split the fascia along its fibers through the superficial ring where an obvious hernia could be revealed.  We mobilized the spermatic cord at the pubic tubercle and placed it up on a work bench with a Penrose drain.  We subsequently began to dissect away was thought to be the hernia sac from the spermatic cord on the anterior medial aspect but there were a lot of adhesions and scar tissue tethering it down.  We ultimately got down to what was thought to be an indirect sac at the base of the internal ring or the deep ring where, as I was dissecting, I noticed  mucosa of the small bowel.  It was not noted that it was definitely small bowel and could have been right colon however an attempt to repair this was made using a TX 30 stapler.  Upon further dissection away this small bowel loop and several loops of small bowel were attached to the hernia sac at its base and it was unclear whether or not comprise part of the wall of the indirect sac.  We opened up the sac more and also mobilized more small bowel into the area of the wound and could see that it did comprise part of the wall of the hernia sac and therefore repair that was done earlier is not adequate.  We mobilized more small bowel and also attempted to dissected away from the indirect sac as much as possible but it seems as though it would not detach completely.  We ended up doing a small bowel resection of approximately 3-4 inches of small bowel using an GIA 55 stapler to reanastomose the bowel and a TX 60 stapler.  We allowed that to fall back into the peritoneal cavity but in order to close off the hernia defect we had to reapproximate the peritoneum using very superficial stitches of 3-0 Vicryl.  Once this was done we did close the floor of the hernia and the inguinal floor going from the shelving edge and the reflected reflected portion of the inguinal ligament to the conjoined tendon using interrupted 0 Novafil sutures.  Approximately 7 of the sutures were needed.  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  No mesh was used in the repair.  Once the hernia was re-approximated and fixed we closed the external oblique fascia on top of the cord using a running 3-0 Vicryl suture.  Interrupted 3-0 Vicryl was used to close the Scarpa's fascia and the skin was closed using running subcuticular stitch of 4-0 Monocryl.  0.5% Marcaine with epinephrine was used to inject into the skin all needle counts, sponge counts, and instrument counts were correct.  PATIENT DISPOSITION:   PACU - hemodynamically stable.   Jimmye Norman 4/5/20196:24 PM

## 2017-10-14 LAB — CBC
HEMATOCRIT: 33.3 % — AB (ref 39.0–52.0)
Hemoglobin: 11.5 g/dL — ABNORMAL LOW (ref 13.0–17.0)
MCH: 31.9 pg (ref 26.0–34.0)
MCHC: 34.5 g/dL (ref 30.0–36.0)
MCV: 92.2 fL (ref 78.0–100.0)
Platelets: 145 10*3/uL — ABNORMAL LOW (ref 150–400)
RBC: 3.61 MIL/uL — AB (ref 4.22–5.81)
RDW: 12.8 % (ref 11.5–15.5)
WBC: 10 10*3/uL (ref 4.0–10.5)

## 2017-10-14 LAB — BASIC METABOLIC PANEL
ANION GAP: 8 (ref 5–15)
BUN: 10 mg/dL (ref 6–20)
CHLORIDE: 102 mmol/L (ref 101–111)
CO2: 24 mmol/L (ref 22–32)
Calcium: 8.5 mg/dL — ABNORMAL LOW (ref 8.9–10.3)
Creatinine, Ser: 0.93 mg/dL (ref 0.61–1.24)
GFR calc non Af Amer: >60 mL/min (ref >60)
Glucose, Bld: 146 mg/dL — ABNORMAL HIGH (ref 65–99)
POTASSIUM: 4 mmol/L (ref 3.5–5.1)
SODIUM: 134 mmol/L — AB (ref 135–145)

## 2017-10-14 MED ORDER — PANTOPRAZOLE SODIUM 40 MG PO TBEC
40.0000 mg | DELAYED_RELEASE_TABLET | Freq: Every day | ORAL | Status: DC
  Administered 2017-10-14 – 2017-10-17 (×4): 40 mg via ORAL
  Filled 2017-10-14 (×4): qty 1

## 2017-10-14 NOTE — Anesthesia Postprocedure Evaluation (Signed)
Anesthesia Post Note  Patient: Gary [name]  Procedure(s) Performed: OPEN RIGHT INGUINAL HERNIA REPAIR ERAS PATHWAY (Right )     Patient location during evaluation: PACU Anesthesia Type: General Level of consciousness: awake and alert Pain management: pain level controlled Vital Signs Assessment: post-procedure vital signs reviewed and stable Respiratory status: spontaneous breathing, nonlabored ventilation and respiratory function stable Cardiovascular status: blood pressure returned to baseline and stable Postop Assessment: no apparent nausea or vomiting Anesthetic complications: no    Last Vitals:  Vitals:   10/14/17 0823 10/14/17 0944  BP: 95/61 (!) 104/57  Pulse: (!) 56 (!) 58  Resp: 16   Temp: 36.8 C 36.8 C  SpO2: 93% 94%    Last Pain:  Vitals:   10/14/17 0944  TempSrc: Oral  PainSc:                  [name]

## 2017-10-14 NOTE — Progress Notes (Signed)
CCS/Kasiya Burck Progress Note 1 Day Post-Op  Subjective: Patient doing well this AM and wants to go home.  No acute distress  Objective: Vital signs in last 24 hours: Temp:  [97.7 F (36.5 C)-99 F (37.2 C)] 98.2 F (36.8 C) (04/06 0426) Pulse Rate:  [35-74] 64 (04/06 0426) Resp:  [10-32] 17 (04/06 0426) BP: (99-130)/(50-68) 109/60 (04/06 0426) SpO2:  [80 %-99 %] 96 % (04/06 0426) Weight:  [54 kg (119 lb)-54.8 kg (120 lb 13 oz)] 54.8 kg (120 lb 13 oz) (04/05 2100) Last BM Date: 10/12/17  Intake/Output from previous day: 04/05 0701 - 04/06 0700 In: 2151.3 [P.O.:240; I.V.:1911.3] Out: 850 [Urine:825; Blood:25] Intake/Output this shift: No intake/output data recorded.  General: No problems, no complaints  Lungs: Clear  Abd: Mildly distended, excellent bowel sounds.  Extremities: No DVt sings or symptoms  Neuro: Intact  Lab Results:  @LABLAST2 (wbc:2,hgb:2,hct:2,plt:2) BMET )No results for input(s): NA, K, CL, CO2, GLUCOSE, BUN, CREATININE, CALCIUM in the last 72 hours. PT/INR Recent Labs    10/13/17 1005  LABPROT 14.1  INR 1.10   ABG No results for input(s): PHART, HCO3 in the last 72 hours.  Invalid input(s): PCO2, PO2  Studies/Results: No results found.  Anti-infectives: Anti-infectives (From admission, onward)   Start     Dose/Rate Route Frequency Ordered Stop   10/14/17 0030  ceFAZolin (ANCEF) IVPB 2g/100 mL premix     2 g 200 mL/hr over 30 Minutes Intravenous Every 8 hours 10/13/17 2111 10/14/17 0037   10/13/17 1623  polymyxin B 500,000 Units, bacitracin 50,000 Units in sodium chloride 0.9 % 500 mL irrigation  Status:  Discontinued       As needed 10/13/17 1623 10/13/17 1802   10/13/17 0940  ceFAZolin (ANCEF) 2-4 GM/100ML-% IVPB    Note to Pharmacy:  [employer] [employer]   : cabinet override      10/13/17 0940 10/13/17 1613   10/13/17 0937  ceFAZolin (ANCEF) IVPB 2g/100 mL premix     2 g 200 mL/hr over 30 Minutes Intravenous On call to O.R. 10/13/17 0937  10/13/17 1613      Assessment/Plan: s/p Procedure(s): OPEN RIGHT INGUINAL HERNIA REPAIR ERAS PATHWAY Advance diet Decrease IVFs.  Likely home tomorrow  LOS: 0 days   Marta Lamas. Gae Bon, MD, FACS [phone] [facility] 10/14/2017

## 2017-10-15 ENCOUNTER — Encounter (HOSPITAL_COMMUNITY): Payer: Self-pay | Admitting: General Surgery

## 2017-10-15 MED ORDER — ALUM & MAG HYDROXIDE-SIMETH 200-200-20 MG/5ML PO SUSP
30.0000 mL | ORAL | Status: DC | PRN
  Administered 2017-10-15: 30 mL via ORAL
  Filled 2017-10-15: qty 30

## 2017-10-15 NOTE — Progress Notes (Signed)
2 Days Post-Op  Subjective: Alert and stable.  Afebrile. no nausea or vomiting.  Sipping on liquids. Passing flatus.  No stool. Excellent urine output. Objective: Vital signs in last 24 hours: Temp:  [98.1 F (36.7 C)-98.7 F (37.1 C)] 98.6 F (37 C) (04/07 0506) Pulse Rate:  [65-71] 67 (04/07 0506) Resp:  [16-18] 16 (04/07 0506) BP: (111-129)/(62-72) 111/62 (04/07 0506) SpO2:  [96 %-98 %] 97 % (04/07 0506) Last BM Date: 10/12/17  Intake/Output from previous day: 04/06 0701 - 04/07 0700 In: 650.8 [P.O.:120; I.V.:530.8] Out: 2500 [Urine:2500] Intake/Output this shift: No intake/output data recorded.  General appearance: Alert.  No distress.  Mental status normal. Resp: clear to auscultation bilaterally GI: Abdomen soft.  Slightly distended.  Hypoactive bowel sounds.  Right groin incision looks good.  Lab Results:  No results found for this or any previous visit (from the past 24 hour(s)).   Studies/Results: No results found.  Marland Kitchen acetaminophen  1,000 mg Oral Q6H  . amiodarone  200 mg Oral Daily  . enoxaparin (LOVENOX) injection  40 mg Subcutaneous Q24H  . feeding supplement (ENSURE ENLIVE)  237 mL Oral TID BM  . losartan  25 mg Oral Daily  . pantoprazole  40 mg Oral QHS     Assessment/Plan: s/p Procedure(s): OPEN RIGHT INGUINAL HERNIA REPAIR ERAS PATHWAY  POD #2 -open repair right inguinal hernia and small bowel resection Seems stable Expected ileus starting to resolve Mobilize Advance diet as tolerated Home when GI function normalizes  @PROBHOSP @  LOS: 0 days    Ernestene Mention 10/15/2017  . .prob

## 2017-10-15 NOTE — Progress Notes (Signed)
Pt.has walked two laps along the hallway.

## 2017-10-15 NOTE — Progress Notes (Signed)
Distended with mild to moderate abdominal tenderness.  No peritonitis.  Will back off diset to full liquids.  Marta Lamas. Gae Bon, MD, FACS [phone] [facility]

## 2017-10-16 ENCOUNTER — Other Ambulatory Visit: Payer: Self-pay

## 2017-10-16 ENCOUNTER — Observation Stay (HOSPITAL_COMMUNITY): Payer: Medicaid Other

## 2017-10-16 DIAGNOSIS — Z6820 Body mass index (BMI) 20.0-20.9, adult: Secondary | ICD-10-CM | POA: Diagnosis not present

## 2017-10-16 DIAGNOSIS — Y92234 Operating room of hospital as the place of occurrence of the external cause: Secondary | ICD-10-CM | POA: Diagnosis not present

## 2017-10-16 DIAGNOSIS — Z7901 Long term (current) use of anticoagulants: Secondary | ICD-10-CM | POA: Diagnosis not present

## 2017-10-16 DIAGNOSIS — Z886 Allergy status to analgesic agent status: Secondary | ICD-10-CM | POA: Diagnosis not present

## 2017-10-16 DIAGNOSIS — Z681 Body mass index (BMI) 19 or less, adult: Secondary | ICD-10-CM | POA: Diagnosis not present

## 2017-10-16 DIAGNOSIS — K565 Intestinal adhesions [bands], unspecified as to partial versus complete obstruction: Secondary | ICD-10-CM | POA: Diagnosis present

## 2017-10-16 DIAGNOSIS — K9189 Other postprocedural complications and disorders of digestive system: Secondary | ICD-10-CM | POA: Diagnosis not present

## 2017-10-16 DIAGNOSIS — Z87891 Personal history of nicotine dependence: Secondary | ICD-10-CM | POA: Diagnosis not present

## 2017-10-16 DIAGNOSIS — K4030 Unilateral inguinal hernia, with obstruction, without gangrene, not specified as recurrent: Secondary | ICD-10-CM | POA: Diagnosis present

## 2017-10-16 DIAGNOSIS — K567 Ileus, unspecified: Secondary | ICD-10-CM | POA: Diagnosis not present

## 2017-10-16 DIAGNOSIS — S36409A Unspecified injury of unspecified part of small intestine, initial encounter: Secondary | ICD-10-CM | POA: Diagnosis not present

## 2017-10-16 DIAGNOSIS — E43 Unspecified severe protein-calorie malnutrition: Secondary | ICD-10-CM | POA: Diagnosis present

## 2017-10-16 DIAGNOSIS — K9171 Accidental puncture and laceration of a digestive system organ or structure during a digestive system procedure: Secondary | ICD-10-CM | POA: Diagnosis not present

## 2017-10-16 LAB — BASIC METABOLIC PANEL
ANION GAP: 14 (ref 5–15)
BUN: 14 mg/dL (ref 6–20)
CHLORIDE: 97 mmol/L — AB (ref 101–111)
CO2: 24 mmol/L (ref 22–32)
CREATININE: 0.95 mg/dL (ref 0.61–1.24)
Calcium: 9.7 mg/dL (ref 8.9–10.3)
GFR calc non Af Amer: >60 mL/min (ref >60)
Glucose, Bld: 132 mg/dL — ABNORMAL HIGH (ref 65–99)
POTASSIUM: 4.3 mmol/L (ref 3.5–5.1)
Sodium: 135 mmol/L (ref 135–145)

## 2017-10-16 LAB — CBC WITH DIFFERENTIAL/PLATELET
BASOS PCT: 0 %
Basophils Absolute: 0 10*3/uL (ref 0.0–0.1)
Eosinophils Absolute: 0 10*3/uL (ref 0.0–0.7)
Eosinophils Relative: 0 %
HEMATOCRIT: 44.1 % (ref 39.0–52.0)
HEMOGLOBIN: 15.2 g/dL (ref 13.0–17.0)
Lymphocytes Relative: 6 %
Lymphs Abs: 0.6 10*3/uL — ABNORMAL LOW (ref 0.7–4.0)
MCH: 31.5 pg (ref 26.0–34.0)
MCHC: 34.5 g/dL (ref 30.0–36.0)
MCV: 91.5 fL (ref 78.0–100.0)
MONOS PCT: 13 %
Monocytes Absolute: 1.4 10*3/uL — ABNORMAL HIGH (ref 0.1–1.0)
NEUTROS ABS: 8.6 10*3/uL — AB (ref 1.7–7.7)
NEUTROS PCT: 81 %
Platelets: 157 10*3/uL (ref 150–400)
RBC: 4.82 MIL/uL (ref 4.22–5.81)
RDW: 12.3 % (ref 11.5–15.5)
WBC: 10.6 10*3/uL — ABNORMAL HIGH (ref 4.0–10.5)

## 2017-10-16 NOTE — Progress Notes (Signed)
RN to place NGT, Pt started to get diaphoretic and refused NGT at this time. Explained importance of NGT. Pt still refused. Made Dr. Lindie Spruce aware. Said its okay to hold NGT at this time. Pt to remain NPO with ice chips and sips with meds. Pt agrees. Will continue to monitor pt.

## 2017-10-16 NOTE — Progress Notes (Signed)
CCS/Gary [name] Progress Note 3 Days Post-Op  Subjective: Patient just vomited undigested food, a lot, feel better, but still distended.    Objective: Vital signs in last 24 hours: Temp:  [98.4 F (36.9 C)-98.6 F (37 C)] 98.4 F (36.9 C) (04/08 0500) Pulse Rate:  [58-78] 78 (04/08 0500) Resp:  [18-20] 20 (04/08 0500) BP: (106-125)/(58-67) 125/67 (04/08 0500) SpO2:  [98 %-99 %] 98 % (04/08 0500) Last BM Date: 10/12/17  Intake/Output from previous day: 04/07 0701 - 04/08 0700 In: 478 [P.O.:478] Out: -  Intake/Output this shift: Total I/O In: 120 [P.O.:120] Out: -   General: Seems very uncomfortable.  Lungs: Clear  Abd: No peritonitis, bowel sounds decreased  Extremities: No changes  Neuro: Intact  Lab Results:  @LABLAST2 (wbc:2,hgb:2,hct:2,plt:2) BMET ) Recent Labs    10/14/17 0635  NA 134*  K 4.0  CL 102  CO2 24  GLUCOSE 146*  BUN 10  CREATININE 0.93  CALCIUM 8.5*   PT/INR Recent Labs    10/13/17 1005  LABPROT 14.1  INR 1.10   ABG No results for input(s): PHART, HCO3 in the last 72 hours.  Invalid input(s): PCO2, PO2  Studies/Results: No results found.  Anti-infectives: Anti-infectives (From admission, onward)   Start     Dose/Rate Route Frequency Ordered Stop   10/14/17 0030  ceFAZolin (ANCEF) IVPB 2g/100 mL premix     2 g 200 mL/hr over 30 Minutes Intravenous Every 8 hours 10/13/17 2111 10/14/17 0037   10/13/17 1623  polymyxin B 500,000 Units, bacitracin 50,000 Units in sodium chloride 0.9 % 500 mL irrigation  Status:  Discontinued       As needed 10/13/17 1623 10/13/17 1802   10/13/17 0940  ceFAZolin (ANCEF) 2-4 GM/100ML-% IVPB    Note to Pharmacy:  [employer]   : cabinet override      10/13/17 0940 10/13/17 1613   10/13/17 0937  ceFAZolin (ANCEF) IVPB 2g/100 mL premix     2 g 200 mL/hr over 30 Minutes Intravenous On call to O.R. 10/13/17 0937 10/13/17 1613      Assessment/Plan: s/p Procedure(s): OPEN RIGHT INGUINAL HERNIA REPAIR  ERAS PATHWAY NPO  NGT INFs  LOS: 0 days   Marta Lamas. Gae Bon, MD, FACS [phone] [facility] 10/16/2017

## 2017-10-16 NOTE — Progress Notes (Signed)
Pt vomited with yellow bile with undigested food. Restarted IVF. Zofran given. Will continue to monitor pt

## 2017-10-16 NOTE — Progress Notes (Signed)
Initial Nutrition Assessment  DOCUMENTATION CODES:   Severe malnutrition in context of chronic illness  INTERVENTION:   -RD will follow for diet advancement and supplement as appropriate  NUTRITION DIAGNOSIS:   Severe Malnutrition related to chronic illness(CHF) as evidenced by moderate fat depletion, severe fat depletion, moderate muscle depletion, severe muscle depletion.  GOAL:   Patient will meet greater than or equal to 90% of their needs  MONITOR:   PO intake, Supplement acceptance, Diet advancement, Labs, Weight trends, Skin, I & O's  REASON FOR ASSESSMENT:   Malnutrition Screening Tool    ASSESSMENT:   The patient is a 42 year old male who presents with an inguinal hernia  4/5- s/p Procedure(s): OPEN RIGHT INGUINAL HERNIA REPAIR ERAS PATHWAY; SMALL BOWEL RESECTION  Case discussed with RN, who reports pt vomited last night. Unable to place NGT due to pt almost passing out; per RN report, he has not had any nausea or vomiting this shift.   Spoke with pt at bedside, who expressed frustration over still being in the hospital. He confirms emesis last night ("My stomach felt tight and it actually made me feel better"). Pt denies any nausea or vomiting since that occurrence. He is consuming ice chips and tolerating well (RD provided pt additional ice chips per his request). He reports he was tolerating food and liquids up until last night, but thought the [place] steak he ate for dinner was "too heavy" for him. He reports passing flatus, but not having a BM since surgery.   Pt reports he has a very sporadic meal schedule, as he often eats on the go between his two jobs. Pt will usually eat in the afternoon ("all kinds of things") and consume a heavy meal, such as pizza or spaghetti, with his children when he comes home.   Reviewed wt hx; noted pt has gained 11# since last hospitalization approximately two months ago. Pt reports continued improved appetite since he stopped  smoking.   Discussed potential for diet advancement. Encouraged pt to consume meals and supplements once diet is advanced.   Labs reviewed.   NUTRITION - FOCUSED PHYSICAL EXAM:    Most Recent Value  Orbital Region  Severe depletion  Upper Arm Region  Moderate depletion  Thoracic and Lumbar Region  Severe depletion  Buccal Region  Moderate depletion  Temple Region  Severe depletion  Clavicle Bone Region  Severe depletion  Clavicle and Acromion Bone Region  Moderate depletion  Scapular Bone Region  Moderate depletion  Dorsal Hand  Moderate depletion  Patellar Region  Severe depletion  Anterior Thigh Region  Severe depletion  Posterior Calf Region  Severe depletion  Edema (RD Assessment)  None  Hair  Reviewed  Eyes  Reviewed  Mouth  Reviewed  Skin  Reviewed  Nails  Reviewed       Diet Order:  Diet NPO time specified Except for: [employer], Sips with Meds  EDUCATION NEEDS:   No education needs have been identified at this time  Skin:  Skin Assessment: Skin Integrity Issues: Skin Integrity Issues:: Incisions Incisions: closed abdomen  Last BM:  10/12/17  Height:   Ht Readings from Last 1 Encounters:  10/13/17 5\' 5"  (1.651 m)    Weight:   Wt Readings from Last 1 Encounters:  10/13/17 120 lb 13 oz (54.8 kg)    Ideal Body Weight:  61.8 kg  BMI:  Body mass index is 20.1 kg/m.  Estimated Nutritional Needs:   Kcal:  1700-1900  Protein:  95-110 grams  Fluid:  1.7-1.9 L    Ellason Segar A. Mayford Knife, RD, LDN, CDE Pager: [phone] After hours Pager: [phone]

## 2017-10-17 DIAGNOSIS — E43 Unspecified severe protein-calorie malnutrition: Secondary | ICD-10-CM

## 2017-10-17 HISTORY — DX: Unspecified severe protein-calorie malnutrition: E43

## 2017-10-17 LAB — CBC WITH DIFFERENTIAL/PLATELET
BASOS ABS: 0 10*3/uL (ref 0.0–0.1)
BASOS PCT: 0 %
EOS ABS: 0.2 10*3/uL (ref 0.0–0.7)
Eosinophils Relative: 3 %
HCT: 38 % — ABNORMAL LOW (ref 39.0–52.0)
Hemoglobin: 13 g/dL (ref 13.0–17.0)
Lymphocytes Relative: 21 %
Lymphs Abs: 1.3 10*3/uL (ref 0.7–4.0)
MCH: 31.2 pg (ref 26.0–34.0)
MCHC: 34.2 g/dL (ref 30.0–36.0)
MCV: 91.1 fL (ref 78.0–100.0)
Monocytes Absolute: 1.1 10*3/uL — ABNORMAL HIGH (ref 0.1–1.0)
Monocytes Relative: 18 %
NEUTROS PCT: 58 %
Neutro Abs: 3.7 10*3/uL (ref 1.7–7.7)
PLATELETS: 147 10*3/uL — AB (ref 150–400)
RBC: 4.17 MIL/uL — AB (ref 4.22–5.81)
RDW: 12 % (ref 11.5–15.5)
WBC: 6.3 10*3/uL (ref 4.0–10.5)

## 2017-10-17 LAB — BASIC METABOLIC PANEL
Anion gap: 7 (ref 5–15)
BUN: 10 mg/dL (ref 6–20)
CO2: 26 mmol/L (ref 22–32)
CREATININE: 0.86 mg/dL (ref 0.61–1.24)
Calcium: 8.9 mg/dL (ref 8.9–10.3)
Chloride: 100 mmol/L — ABNORMAL LOW (ref 101–111)
Glucose, Bld: 127 mg/dL — ABNORMAL HIGH (ref 65–99)
POTASSIUM: 4.1 mmol/L (ref 3.5–5.1)
SODIUM: 133 mmol/L — AB (ref 135–145)

## 2017-10-17 MED ORDER — BOOST / RESOURCE BREEZE PO LIQD CUSTOM
1.0000 | Freq: Three times a day (TID) | ORAL | Status: DC
  Administered 2017-10-17: 1 via ORAL

## 2017-10-17 MED ORDER — WARFARIN - PHARMACIST DOSING INPATIENT: Freq: Every day | Status: DC

## 2017-10-17 MED ORDER — WARFARIN SODIUM 5 MG PO TABS
5.0000 mg | ORAL_TABLET | Freq: Once | ORAL | Status: AC
  Administered 2017-10-17: 5 mg via ORAL
  Filled 2017-10-17: qty 1

## 2017-10-17 NOTE — Progress Notes (Signed)
CCS/Raeley Gilmore Progress Note 4 Days Post-Op  Subjective: Patient is much better this AM.  Vomited yesterday Am, nothing since then  Objective: Vital signs in last 24 hours: Temp:  [98.5 F (36.9 C)-98.7 F (37.1 C)] 98.5 F (36.9 C) (04/09 0551) Pulse Rate:  [55-67] 57 (04/09 0551) Resp:  [16-18] 16 (04/09 0551) BP: (98-109)/(54-62) 98/55 (04/09 0551) SpO2:  [95 %-99 %] 95 % (04/09 0551) Last BM Date: 10/12/17  Intake/Output from previous day: No intake/output data recorded. Intake/Output this shift: No intake/output data recorded.  General: No distress  Lungs: Clear  Abd: Soft, not distended, great bowel sounds.  Not tender.  Passing flatus,  Hernia repair intact  Extremities: No clinical signs or symptoms of DVT  Neuro: Intact  Lab Results:  @LABLAST2 (wbc:2,hgb:2,hct:2,plt:2) BMET ) Recent Labs    10/16/17 0655 10/17/17 0437  NA 135 133*  K 4.3 4.1  CL 97* 100*  CO2 24 26  GLUCOSE 132* 127*  BUN 14 10  CREATININE 0.95 0.86  CALCIUM 9.7 8.9   PT/INR No results for input(s): LABPROT, INR in the last 72 hours. ABG No results for input(s): PHART, HCO3 in the last 72 hours.  Invalid input(s): PCO2, PO2  Studies/Results: Dg Abd 1 View - Kub  Result Date: 10/16/2017 CLINICAL DATA:  Hernia surgery on 10/13/2017, no bowel movement since, possible ileus, vomiting today, history CHF EXAM: ABDOMEN - 1 VIEW COMPARISON:  None FINDINGS: Air-filled loops of large and small bowel with gas present to rectum. Scattered stool throughout colon. Findings most likely reflect postoperative ileus. No bowel wall thickening or evidence of obstruction. No acute osseous findings. Heart appears enlarged. IMPRESSION: Probable postoperative ileus. Electronically Signed   By: Ulyses Southward M.D.   On: 10/16/2017 14:26    Anti-infectives: Anti-infectives (From admission, onward)   Start     Dose/Rate Route Frequency Ordered Stop   10/14/17 0030  ceFAZolin (ANCEF) IVPB 2g/100 mL premix     2  g 200 mL/hr over 30 Minutes Intravenous Every 8 hours 10/13/17 2111 10/14/17 0037   10/13/17 1623  polymyxin B 500,000 Units, bacitracin 50,000 Units in sodium chloride 0.9 % 500 mL irrigation  Status:  Discontinued       As needed 10/13/17 1623 10/13/17 1802   10/13/17 0940  ceFAZolin (ANCEF) 2-4 GM/100ML-% IVPB    Note to Pharmacy:  [employer]   : cabinet override      10/13/17 0940 10/13/17 1613   10/13/17 0937  ceFAZolin (ANCEF) IVPB 2g/100 mL premix     2 g 200 mL/hr over 30 Minutes Intravenous On call to O.R. 10/13/17 0937 10/13/17 1613      Assessment/Plan: s/p Procedure(s): OPEN RIGHT INGUINAL HERNIA REPAIR ERAS PATHWAY Advance diet Clear l iquids  Decrease IVFs. Restart coumadin  LOS: 1 day   Marta Lamas. Gae Bon, MD, FACS [phone] [facility] 10/17/2017

## 2017-10-17 NOTE — Care Management Note (Signed)
Case Management Note  Patient Details  Name: Gary [name] MRN: [id number] Date of Birth: 07-Sep-1975  Subjective/Objective:                    Action/Plan:  Advanced diet today. Contiuing to follow for discharge needs.  Expected Discharge Date:                  Expected Discharge Plan:  Home/Self Care  In-House Referral:     Discharge planning Services     Post Acute Care Choice:  NA Choice offered to:     DME Arranged:    DME Agency:     HH Arranged:    HH Agency:     Status of Service:  In process, will continue to follow  If discussed at Long Length of Stay Meetings, dates discussed:    Additional Comments:  Kingsley Plan, RN 10/17/2017, 10:58 AM

## 2017-10-17 NOTE — Progress Notes (Signed)
Brief Nutrition Follow-Up Note  Chart reviewed. RD assessed pt on 10/16/17; see initial assessment for further details.   Pt has been advanced to a clear liquid diet. Will order Boost Breeze po TID, each supplement provides 250 kcal and 9 grams of protein. RD will continue to follow for diet advancement and supplement acceptance.   Krystopher Kuenzel A. Mayford Knife, RD, LDN, CDE Pager: [phone] After hours Pager: [phone]

## 2017-10-17 NOTE — Progress Notes (Signed)
ANTICOAGULATION CONSULT NOTE - Initial Consult  Pharmacy Consult for warfarin Indication: atrial fibrillation  Allergies  Allergen Reactions  . Aspirin     Internal bleeding     Patient Measurements: Height: 5\' 5"  (165.1 cm) Weight: 120 lb 13 oz (54.8 kg) IBW/kg (Calculated) : 61.5   Vital Signs: Temp: 98.5 F (36.9 C) (04/09 0551) Temp Source: Oral (04/09 0551) BP: 98/55 (04/09 0551) Pulse Rate: 57 (04/09 0551)  Labs: Recent Labs    10/16/17 0655 10/17/17 0437  HGB 15.2 13.0  HCT 44.1 38.0*  PLT 157 147*  CREATININE 0.95 0.86    Estimated Creatinine Clearance: 86.7 mL/min (by C-G formula based on SCr of 0.86 mg/dL).  Assessment: CC/HPI: 42 yo m presenting with hernia pain s/p surgery  PMH: HTN, HF, Afib  Anticoag: warfarin pta for hx of heart surgery/afib - held since admit d/t surgery. INR 1.1 on 4/5  PTA warfarin 2.5 mg Sunday, 5 mg AOD  Renal: SCr 0.86   Heme/Onc: H&H 13/38, Plt 147  Goal of Therapy:  INR 2-3 Monitor platelets by anticoagulation protocol: Yes   Plan:  Warfarin 5 mg x 1 Daily INR Continue enoxaparin 40 for now  Isaac Bliss, PharmD, BCPS, BCCCP Clinical Pharmacist Clinical phone for 10/17/2017 from 7a-3:30p: [phone] If after 3:30p, please call main pharmacy at: [phone] 10/17/2017 7:32 AM

## 2017-10-18 LAB — PROTIME-INR
INR: 1.06
PROTHROMBIN TIME: 13.7 s (ref 11.4–15.2)

## 2017-10-18 MED ORDER — WARFARIN SODIUM 7.5 MG PO TABS
7.5000 mg | ORAL_TABLET | Freq: Once | ORAL | Status: DC
  Filled 2017-10-18: qty 1

## 2017-10-18 MED ORDER — OXYCODONE HCL 5 MG PO TABS: 5.0000 mg | ORAL_TABLET | ORAL | 0 refills | Status: DC | PRN

## 2017-10-18 NOTE — Progress Notes (Signed)
ANTICOAGULATION CONSULT NOTE - Initial Consult  Pharmacy Consult for warfarin Indication: atrial fibrillation  Allergies  Allergen Reactions  . Aspirin     Internal bleeding     Patient Measurements: Height: 5\' 5"  (165.1 cm) Weight: 120 lb 13 oz (54.8 kg) IBW/kg (Calculated) : 61.5   Vital Signs: Temp: 100 F (37.8 C) (04/10 0700) Temp Source: Oral (04/10 0700) BP: 96/70 (04/10 0826) Pulse Rate: 56 (04/10 0826)  Labs: Recent Labs    10/16/17 0655 10/17/17 0437 10/18/17 0545  HGB 15.2 13.0  --   HCT 44.1 38.0*  --   PLT 157 147*  --   LABPROT  --   --  13.7  INR  --   --  1.06  CREATININE 0.95 0.86  --     Estimated Creatinine Clearance: 86.7 mL/min (by C-G formula based on SCr of 0.86 mg/dL).  Assessment: CC/HPI: 42 yo m presenting with hernia pain s/p surgery  PMH: HTN, HF, Afib  Anticoag: warfarin pta for hx of heart surgery/afib - held since admit d/t surgery. INR 1.06  PTA warfarin 2.5 mg Sunday, 5 mg AOD  Renal: SCr 0.86   Heme/Onc: H&H 13/38, Plt 147  Goal of Therapy:  INR 2-3 Monitor platelets by anticoagulation protocol: Yes   Plan:  Warfarin 7.5 mg x 1 Daily INR Continue enoxaparin 40 for now  Isaac Bliss, PharmD, BCPS, BCCCP Clinical Pharmacist Clinical phone for 10/18/2017 from 7a-3:30p: [phone] If after 3:30p, please call main pharmacy at: [phone] 10/18/2017 12:47 PM

## 2017-10-18 NOTE — Progress Notes (Signed)
[facility]ah[facility] Progress Note 5 Days Post-Op  Subjective: Patient doing well.  Had two bowel movements yesterday.  Passing gas  Objective: Vital signs in last 24 hours: Temp:  [98.2 F (36.8 C)-98.5 F (36.9 C)] 98.2 F (36.8 C) (04/10 0545) Pulse Rate:  [55-67] 62 (04/10 0545) Resp:  [16-18] 18 (04/10 0545) BP: (105-111)/(62-68) 111/62 (04/10 0545) SpO2:  [99 %-100 %] 99 % (04/10 0545) Last BM Date: 10/17/17  Intake/Output from previous day: 04/09 0701 - 04/10 0700 In: 2919.6 [P.O.:630; I.V.:2289.6] Out: -  Intake/Output this shift: No intake/output data recorded.  General: No distress.  Has a bit of LLQ gaseous discomfort.  No acute distress  Lungs: Clear  Abd: Slightly more distended today than yesterday.  Good bowel sounds.  Not tender at all.  Extremities: No changes  Neuro: Intact  Lab Results:  @LABLAST2 (wbc:2,hgb:2,hct:2,plt:2) BMET ) Recent Labs    10/16/17 0655 10/17/17 0437  NA 135 133*  K 4.3 4.1  CL 97* 100*  CO2 24 26  GLUCOSE 132* 127*  BUN 14 10  CREATININE 0.95 0.86  CALCIUM 9.7 8.9   PT/INR Recent Labs    10/18/17 0545  LABPROT 13.7  INR 1.06   ABG No results for input(s): PHART, HCO3 in the last 72 hours.  Invalid input(s): PCO2, PO2  Studies/Results: Dg Abd 1 View - Kub  Result Date: 10/16/2017 CLINICAL DATA:  Hernia surgery on 10/13/2017, no bowel movement since, possible ileus, vomiting today, history CHF EXAM: ABDOMEN - 1 VIEW COMPARISON:  None FINDINGS: Air-filled loops of large and small bowel with gas present to rectum. Scattered stool throughout colon. Findings most likely reflect postoperative ileus. No bowel wall thickening or evidence of obstruction. No acute osseous findings. Heart appears enlarged. IMPRESSION: Probable postoperative ileus. Electronically Signed   By: Ulyses Southward M.D.   On: 10/16/2017 14:26    Anti-infectives: Anti-infectives (From admission, onward)   Start     Dose/Rate Route Frequency Ordered Stop   10/14/17 0030  ceFAZolin (ANCEF) IVPB 2g/100 mL premix     2 g 200 mL/hr over 30 Minutes Intravenous Every 8 hours 10/13/17 2111 10/14/17 0037   10/13/17 1623  polymyxin B 500,000 Units, bacitracin 50,000 Units in sodium chloride 0.9 % 500 mL irrigation  Status:  Discontinued       As needed 10/13/17 1623 10/13/17 1802   10/13/17 0940  ceFAZolin (ANCEF) 2-4 GM/100ML-% IVPB    Note to Pharmacy:  Evern [employer]   : cabinet override      10/13/17 0940 10/13/17 1613   10/13/17 0937  ceFAZolin (ANCEF) IVPB 2g/100 mL premix     2 g 200 mL/hr over 30 Minutes Intravenous On call to O.R. 10/13/17 0937 10/13/17 1613      Assessment/Plan: s/p Procedure(s): OPEN RIGHT INGUINAL HERNIA REPAIR ERAS PATHWAY Advance diet Discharge  LOS: 2 days   Marta Lamas. Gae Bon, MD, FACS [phone] [facility] Surgery 10/18/2017

## 2017-10-18 NOTE — Progress Notes (Signed)
Pt was ambulating to the hall, surgical pain minimal. Tolerating soft diet. Had a BM today. Discharge instructions given to pt, verbalized understanding. Discharged to home.

## 2017-10-18 NOTE — Discharge Instructions (Signed)
Inguinal Hernia Repair, Adult, Care After This sheet gives you information about how to care for yourself after your procedure. Your health care provider may also give you more specific instructions. If you have problems or questions, contact your health care provider. What can I expect after the procedure? After the procedure, it is common to have:  Pain.  Swelling and bruising around the incision area.  Scrotal swelling, in men.  Some fluid or blood draining from your incisions.  Follow these instructions at home: Incision care  Follow instructions from your health care provider about how to take care of your incisions. Make sure you: ? Wash your hands with soap and water before you change your bandage (dressing). If soap and water are not available, use hand sanitizer. ? Change your dressing as told by your health care provider. ? Leave stitches (sutures), skin glue, or adhesive strips in place. These skin closures may need to stay in place for 2 weeks or longer. If adhesive strip edges start to loosen and curl up, you may trim the loose edges. Do not remove adhesive strips completely unless your health care provider tells you to do that.  Check your incision area every day for signs of infection. Check for: ? More redness, swelling, or pain. ? More fluid or blood. ? Warmth. ? Pus or a bad smell.  Wear loose, soft clothing while your incisions heal.  Leave the dressing in place and intact as long as there is no drainage or increasing pain. Driving  Do not drive or use heavy machinery while taking prescription pain medicine.  Do not drive for 24 hours if you were given a medicine to help you relax (sedative) during your procedure. Activity  Do not lift anything that is heavier than 10 lb (4.5 kg), or the limit that you are told, until your health care provider says that it is safe.  Ask your health care provider what activities are safe for you.A lot of activity during the  first week after surgery can increase pain and swelling. For 1 week after your procedure: ? Avoid activities that take a lot of effort, such as exercise or sports. ? You may walk and climb stairs as needed for daily activity, but avoid long walks or climbing stairs for exercise. Managing pain and swelling  Put ice on painful or swollen areas: ? Put ice in a plastic bag. ? Place a towel between your skin and the bag. ? Leave the ice on for 20 minutes, 2-3 times a day. General instructions  Do not take baths, swim, or use a hot tub until your health care provider approves. Ask your health care provider if you may take showers. You may only be allowed to take sponge baths.  Take over-the-counter and prescription medicines only as told by your health care provider.  To prevent or treat constipation while you are taking prescription pain medicine, your health care provider may recommend that you: ? Drink enough fluid to keep your urine pale yellow. ? Take over-the-counter or prescription medicines. ? Eat foods that are high in fiber, such as fresh fruits and vegetables, whole grains, and beans. ? Limit foods that are high in fat and processed sugars, such as fried and sweet foods.  Do not use any products that contain nicotine or tobacco, such as cigarettes and e-cigarettes. If you need help quitting, ask your health care provider.  Drink enough fluid to keep your urine pale yellow.  Keep all follow-up visits as  told by your health care provider. This is important. Contact a health care provider if:  You have more redness, swelling, or pain around your incisions or your groin area.  You have more swelling in your scrotum.  You have more fluid or blood coming from your incisions.  Your incisions feel warm to the touch.  You have severe pain and medicines do not help.  You have abdominal pain or swelling.  You cannot eat or drink without vomiting.  You cannot urinate or pass a  bowel movement.  You faint.  You feel dizzy.  You have nausea and vomiting.  You have a fever. Get help right away if:  You have pus or a bad smell coming from your incisions.  You have chest pain.  You have problems breathing. Summary  Pain, swelling, and bruising are common after the procedure.  Check your incision area every day for signs of infection, such as more redness, swelling, or pain.  Put ice on painful or swollen areas for 20 minutes, 2-3 times a day. This information is not intended to replace advice given to you by your health care provider. Make sure you discuss any questions you have with your health care provider.   Open Small Bowel Resection, Care After This sheet gives you information about how to care for yourself after your procedure. Your health care provider may also give you more specific instructions. If you have problems or questions, contact your health care provider. What can I expect after the procedure? After the procedure, it is common to have:  Pain in your abdomen, especially along your incision. You will be given pain medicines to control this.  Tiredness. This is a normal part of the recovery process. Your energy level will return to normal over the next several weeks.  Constipation. You may be given a stool softener to help prevent this.  Follow these instructions at home: Medicines  Take over-the-counter and prescription medicines only as told by your health care provider.  Do not drive or use heavy machinery while taking prescription pain medicine.  If you were prescribed an antibiotic medicine, use it as told by your health care provider. Do not stop using the antibiotic even if you start to feel better. Activity  Return to your normal activities as told by your health care provider. Ask your health care provider what activities are safe for you.  Do not lift anything that is heavier than 10 lb (4.5 kg) until your health care  provider says that it is safe.  Take frequent rest breaks during the day as needed.  Try to take short walks every day for the amount of time that your health care provider suggests.  Avoid activities that require a lot of effort (are strenuous) for as long as told by your health care provider. Incision care  Follow instructions from your health care provider about how to take care of your incision. Make sure you: ? Wash your hands with soap and water before you change your bandage (dressing). If soap and water are not available, use hand sanitizer. ? Change your dressing as told by your health care provider. ? Leave stitches (sutures), skin glue, or adhesive strips in place. These skin closures may need to stay in place for 2 weeks or longer. If adhesive strip edges start to loosen and curl up, you may trim the loose edges. Do not remove adhesive strips completely unless your health care provider tells you to do that.  Check your incision area every day for signs of infection. Check for: ? More redness, swelling, or pain. ? More fluid or blood. ? Warmth. ? Pus or a bad smell.  You may shower and pat your wound dry.  Leave plastic dressing intact.  Your bowel resection was performed through the same incision as your hernia repair. General instructions  Continue to practice deep breathing and coughing. If it hurts to cough, try holding a pillow against your abdomen as you cough.  To prevent or treat constipation while you are taking prescription pain medicine, your health care provider may recommend that you: ? Drink enough fluid to keep your urine clear or pale yellow. ? Take over-the-counter or prescription medicines. ? Eat foods that are high in fiber, such as fresh fruits and vegetables, whole grains, and beans. ? Limit foods that are high in fat and processed sugars, such as fried and sweet foods.  Do not use any products that contain nicotine or tobacco as told by your health care  provider. These include cigarettes and e-cigarettes. If you need help quitting, ask your health care provider.  Keep all follow-up visits as told by your health care provider. This is important. Contact a health care provider if:  You have pain that is not relieved with medicine.  You do not feel like eating.  You feel nauseous or you vomit.  You have constipation that is not relieved with prescribed stool softeners.  You have more redness, swelling, or pain around your incision.  You have more fluid or blood coming from your incision.  Your incision feels warm to the touch.  You have pus or a bad smell coming from your incision.  You have a fever. Get help right away if:  Your pain gets worse, even after you take pain medicine.  Your legs or arms hurt or become red or swollen.  You have chest pain.  You have trouble breathing. This information is not intended to replace advice given to you by your health care provider. Make sure you discuss any questions you have with your health care provider.  Marta Lamas. Gae Bon, MD, FACS [phone] [facility] Surgery

## 2017-10-18 NOTE — Discharge Summary (Signed)
Physician Discharge Summary  Patient ID: Gary [name] MRN: [id number] DOB/AGE: Feb 28, 1976 42 y.o.  Admit date: 10/13/2017 Discharge date: 10/18/2017  Admission Diagnoses:  Discharge Diagnoses:  Active Problems:   Incarcerated inguinal hernia   Protein-calorie malnutrition, severe Postoperative ileus  Discharged Condition: good  Hospital Course: The patient is admitted status post open right inguinal hernia repair complicated by a small bowel injury requiring a very small segment small bowel resection.  He developed a postoperative ileus manifested by nausea and vomiting.  This required approximately 24 hours to resolve.  No NG tube was placed.  He quickly resolved and currently the patient is having bowel movements, passing flatus, and tolerating a soft diet.  His wound is healing well with no evidence of recurrent hernia or infection.  It is covered by a clear dressing and Steri-Strips.  Consults: None  Significant Diagnostic Studies: labs: cbc/bmet and radiology: KUB: Ileus  Treatments: IV hydration, antibiotics: Ancef, analgesia: Dilaudid and oxycodone and tramadol, cardiac meds: amiodarone and coumadin and surgery: RIH repair with SB resectiion  Discharge Exam: Blood pressure 94/60, pulse (!) 55, temperature 98.4 F (36.9 C), temperature source Oral, resp. rate 17, height 5\' 5"  (1.651 m), weight 54.8 kg (120 lb 13 oz), SpO2 100 %. General appearance: alert, cooperative, appears stated age and no distress GI: normal findings: bowel sounds normal and Incision is clean and dry.  No evidence of infection    Disposition: Discharge disposition: 01-Home or Self Care       Discharge Instructions    Call MD for:  difficulty breathing, headache or visual disturbances   Complete by:  As directed    Call MD for:  extreme fatigue   Complete by:  As directed    Call MD for:  hives   Complete by:  As directed    Call MD for:  persistant dizziness or light-headedness   Complete  by:  As directed    Call MD for:  persistant nausea and vomiting   Complete by:  As directed    Call MD for:  redness, tenderness, or signs of infection (pain, swelling, redness, odor or green/yellow discharge around incision site)   Complete by:  As directed    Call MD for:  severe uncontrolled pain   Complete by:  As directed    Call MD for:  temperature >100.4   Complete by:  As directed    Diet - low sodium heart healthy   Complete by:  As directed    Discharge instructions   Complete by:  As directed    See specific instruction for inguinal hernia repair and small bowel resection   Driving Restrictions   Complete by:  As directed    One week   Increase activity slowly   Complete by:  As directed    Leave dressing on - Keep it clean, dry, and intact until clinic visit   Complete by:  As directed    Lifting restrictions   Complete by:  As directed    6 weeks nothing > 20 pounds   Sexual Activity Restrictions   Complete by:  As directed    Until pain is well controlled     Allergies as of 10/18/2017      Reactions   Aspirin    Internal bleeding       Medication List    STOP taking these medications   feeding supplement (ENSURE ENLIVE) Liqd   traMADol 50 MG tablet Commonly known as:  Janean Sark  TAKE these medications   amiodarone 200 MG tablet Commonly known as:  PACERONE Take 1 tablet (200 mg total) by mouth daily. What changed:  how much to take   losartan 25 MG tablet Commonly known as:  COZAAR Take 1 tablet (25 mg total) by mouth daily.   oxyCODONE 5 MG immediate release tablet Commonly known as:  Oxy IR/ROXICODONE Take 1-2 tablets (5-10 mg total) by mouth every 4 (four) hours as needed for moderate pain.   warfarin 5 MG tablet Commonly known as:  COUMADIN Take 1 tablet (5 mg total) by mouth one time only at 6 PM. What changed:    how much to take  when to take this  additional instructions      Follow-up Information    Jimmye Norman, MD  Follow up in 2 week(s).   Specialty:  General Surgery Contact information: [address] [place] [address] [phone]           Signed: Jimmye Norman 10/18/2017, 2:14 PM

## 2017-10-26 ENCOUNTER — Ambulatory Visit (INDEPENDENT_AMBULATORY_CARE_PROVIDER_SITE_OTHER): Payer: Medicaid Other

## 2017-10-26 ENCOUNTER — Encounter (HOSPITAL_COMMUNITY): Payer: Self-pay | Admitting: Family Medicine

## 2017-10-26 ENCOUNTER — Ambulatory Visit (HOSPITAL_COMMUNITY)
Admission: EM | Admit: 2017-10-26 | Discharge: 2017-10-26 | Disposition: A | Discharge status: Home / Self Care | Payer: Medicaid Other | Attending: Family Medicine | Admitting: Family Medicine

## 2017-10-26 DIAGNOSIS — K5900 Constipation, unspecified: Secondary | ICD-10-CM

## 2017-10-26 MED ORDER — DOCUSATE SODIUM 100 MG PO CAPS: 100.0000 mg | ORAL_CAPSULE | Freq: Two times a day (BID) | ORAL | 0 refills | Status: DC | PRN

## 2017-10-26 NOTE — ED Provider Notes (Signed)
[facility]    CSN: [id number] Arrival date & time: 10/26/17  1000     History   Chief Complaint Chief Complaint  Patient presents with  . Constipation    HPI Gary [name] is a 42 y.o. male.   42 year old male, with history of atrial fibrillation, CHF, status post recent right inguinal hernia repair complicated by bowel injury requiring resection with an ileus postop, presenting today complain of constipation.  States that he has not had a bowel movement in the past few days.  States that he did have a small bowel movement this morning.  He denies any associated abdominal pain, nausea or vomiting.  States that he is returning to his regular diet postop.  He has had no fever chills, rectal bleeding.  He is not currently taking any narcotic pain medications.  The history is provided by the patient.  Constipation  Severity:  Mild Time since last bowel movement:  3 days Timing:  Constant Progression:  Unchanged Chronicity:  New Context: not dehydration, not dietary changes, not medication, not narcotics and not stress   Stool description:  Formed Unusual stool frequency:  Daily  Relieved by:  Nothing Worsened by:  Nothing Ineffective treatments:  None tried Associated symptoms: no abdominal pain, no anorexia, no back pain, no diarrhea, no dysuria, no fever, no flatus, no hematochezia, no nausea, no urinary retention and no vomiting   Risk factors: hx of abdominal surgery and recent surgery   Risk factors: no change in medication, no obesity, no recent antibiotic use, no recent illness and no recent travel     Past Medical History:  Diagnosis Date  . CHF (congestive heart failure) (HCC)   . Dysrhythmia    hx. of  A-fib  . Internal bleeding    DUE TO ASPIRIN, OR MEDS  . Murmur   . Smoker     Patient Active Problem List   Diagnosis Date Noted  . Protein-calorie malnutrition, severe 10/17/2017  . Incarcerated inguinal hernia 10/13/2017  . Acute congestive  heart failure (HCC) 08/14/2017    Past Surgical History:  Procedure Laterality Date  . ABDOMINAL SURGERY    . ABDOMINAL SURGERY     under 10 yrs. old or younger  . CARDIAC SURGERY    . CARDIOVERSION N/A 08/17/2017   Procedure: CARDIOVERSION;  Surgeon: Orpah Cobb, MD;  Location: [facility] ENDOSCOPY;  Service: Cardiovascular;  Laterality: N/A;  . HERNIA REPAIR    . INGUINAL HERNIA REPAIR Right 10/13/2017   Procedure: OPEN RIGHT INGUINAL HERNIA REPAIR ERAS PATHWAY;  Surgeon: Jimmye Norman, MD;  Location: [facility] OR;  Service: General;  Laterality: Right;  . OPEN HEART SURGERY     AS A CHILD , pt. thinks he was under 10 yrs. old. done in [place]  . TEE WITHOUT CARDIOVERSION N/A 08/17/2017   Procedure: TRANSESOPHAGEAL ECHOCARDIOGRAM (TEE);  Surgeon: Orpah Cobb, MD;  Location: [facility] ENDOSCOPY;  Service: Cardiovascular;  Laterality: N/A;       Home Medications    Prior to Admission medications   Medication Sig Start Date End Date Taking? Authorizing Provider  amiodarone (PACERONE) 200 MG tablet Take 1 tablet (200 mg total) by mouth daily. Patient taking differently: Take 100 mg by mouth daily.  08/22/17   Rinaldo Cloud, MD  docusate sodium (COLACE) 100 MG capsule Take 1 capsule (100 mg total) by mouth 2 (two) times daily as needed for mild constipation. 10/26/17   Ilissa Rosner C, PA-C  losartan (COZAAR) 25 MG tablet Take 1  tablet (25 mg total) by mouth daily. 08/22/17   Rinaldo Cloud, MD  oxyCODONE (OXY IR/ROXICODONE) 5 MG immediate release tablet Take 1-2 tablets (5-10 mg total) by mouth every 4 (four) hours as needed for moderate pain. 10/18/17   Jimmye Norman, MD  warfarin (COUMADIN) 5 MG tablet Take 1 tablet (5 mg total) by mouth one time only at 6 PM. Patient taking differently: Take 2.5-5 mg by mouth daily at 6 PM. Take 5 mg by mouth once daily at 6pm except Sunday take 2.5 mg 08/21/17   Rinaldo Cloud, MD    Family History History reviewed. No pertinent family history.  Social History Social  History   Tobacco Use  . Smoking status: Former Smoker    Packs/day: 1.00    Years: 20.00    Pack years: 20.00    Types: Cigarettes  . Smokeless tobacco: Never Used  Substance Use Topics  . Alcohol use: Not Currently    Comment: social  . Drug use: Not Currently    Types: Marijuana    Comment: last usage 3 months ago     Allergies   Aspirin   Review of Systems Review of Systems  Constitutional: Negative for chills and fever.  HENT: Negative for ear pain and sore throat.   Eyes: Negative for pain and visual disturbance.  Respiratory: Negative for cough and shortness of breath.   Cardiovascular: Negative for chest pain and palpitations.  Gastrointestinal: Positive for constipation. Negative for abdominal pain, anorexia, diarrhea, flatus, hematochezia, nausea and vomiting.  Genitourinary: Negative for dysuria and hematuria.  Musculoskeletal: Negative for arthralgias and back pain.  Skin: Negative for color change and rash.  Neurological: Negative for seizures and syncope.  All other systems reviewed and are negative.    Physical Exam Triage Vital Signs ED Triage Vitals  Enc Vitals Group     BP 10/26/17 1022 99/61     Pulse Rate 10/26/17 1022 (!) 55     Resp 10/26/17 1022 18     Temp 10/26/17 1022 98.4 F (36.9 C)     Temp src --      SpO2 10/26/17 1022 100 %     Weight --      Height --      Head Circumference --      Peak Flow --      Pain Score 10/26/17 1020 6     Pain Loc --      Pain Edu? --      Excl. in GC? --    No data found.  Updated Vital Signs BP 99/61   Pulse (!) 55   Temp 98.4 F (36.9 C)   Resp 18   SpO2 100%   Visual Acuity Right Eye Distance:   Left Eye Distance:   Bilateral Distance:    Right Eye Near:   Left Eye Near:    Bilateral Near:     Physical Exam  Constitutional: He appears well-developed and well-nourished.  HENT:  Head: Normocephalic and atraumatic.  Eyes: Conjunctivae are normal.  Neck: Neck supple.    Cardiovascular: Normal rate and regular rhythm.  No murmur heard. Pulmonary/Chest: Effort normal and breath sounds normal. No respiratory distress.  Abdominal: Soft. Bowel sounds are decreased. There is no tenderness.  Musculoskeletal: He exhibits no edema.  Neurological: He is alert.  Skin: Skin is warm and dry.  Psychiatric: He has a normal mood and affect.  Nursing note and vitals reviewed.    UC Treatments / Results  Labs (  all labs ordered are listed, but only abnormal results are displayed) Labs Reviewed - No data to display  EKG None Radiology Dg Abd 1 View  Result Date: 10/26/2017 CLINICAL DATA:  Patient status post right inguinal hernia repair 10/13/2017. Decreased appetite and constipation. EXAM: ABDOMEN - 1 VIEW COMPARISON:  Plain films of the abdomen 10/16/2017. FINDINGS: There is mild gaseous distention of small and large bowel. No abnormal abdominal calcification or focal bony abnormality is identified. Stool burden is unremarkable. IMPRESSION: Bowel-gas pattern suggestive of mild ileus. Stool burden is unremarkable. Electronically Signed   By: Drusilla Kanner M.D.   On: 10/26/2017 11:13    Procedures Procedures (including critical care time)  Medications Ordered in UC Medications - No data to display   Initial Impression / Assessment and Plan / UC Course  I have reviewed the triage vital signs and the nursing notes.  Pertinent labs & imaging results that were available during my care of the patient were reviewed by me and considered in my medical decision making (see chart for details).     3 days of constipation.  Recent inguinal hernia repair complicated by bowel resection and ileus.  Patient denies any abdominal pain, nausea or vomiting.  X-ray of his abdomen shows mild ileus.  Patient is requesting stool softener prescription.  Patient has an upcoming appointment for postop follow-up with general surgery.  He will follow-up at that time.  Strict return  precautions discussed  Final Clinical Impressions(s) / UC Diagnoses   Final diagnoses:  Constipation, unspecified constipation type    ED Discharge Orders        Ordered    docusate sodium (COLACE) 100 MG capsule  2 times daily PRN     10/26/17 1131       Controlled Substance Prescriptions [place] Controlled Substance Registry consulted? Not Applicable   Alecia Lemming, [place] 10/26/17 1235

## 2017-10-26 NOTE — ED Triage Notes (Signed)
Pt here for constipation. Recent hernia surgery. He had a BM this am but small amount.

## 2018-05-21 ENCOUNTER — Ambulatory Visit (HOSPITAL_COMMUNITY)
Admission: EM | Admit: 2018-05-21 | Discharge: 2018-05-21 | Disposition: A | Discharge status: Home / Self Care | Payer: Medicaid Other | Attending: Family Medicine | Admitting: Family Medicine

## 2018-05-21 ENCOUNTER — Encounter (HOSPITAL_COMMUNITY): Payer: Self-pay

## 2018-05-21 ENCOUNTER — Other Ambulatory Visit: Payer: Self-pay

## 2018-05-21 DIAGNOSIS — L299 Pruritus, unspecified: Secondary | ICD-10-CM

## 2018-05-21 DIAGNOSIS — R21 Rash and other nonspecific skin eruption: Secondary | ICD-10-CM

## 2018-05-21 MED ORDER — PREDNISONE 10 MG (21) PO TBPK: ORAL_TABLET | Freq: Every day | ORAL | 0 refills | Status: DC

## 2018-05-21 MED ORDER — PERMETHRIN 5 % EX CREA: TOPICAL_CREAM | CUTANEOUS | 1 refills | Status: DC

## 2018-05-21 NOTE — ED Triage Notes (Signed)
Pt states she has a rash on his lower back. X 1 month. Constant itch back and legs.

## 2018-05-21 NOTE — ED Provider Notes (Signed)
[facility]ARE [facility]ENTER   [id number] 05/21/18 Arrival Time: 3546  ASSESSMENT & PLAN:  1. Rash   2. Itching    Discussed various common causes of his rash and itching such as scabies, pityriasis rosea, and contact/hypersensitivity. Empiric treatment for scabies given along with written information on this and pityriasis rosea.  Meds ordered this encounter  Medications  . permethrin (ELIMITE) 5 % cream    Sig: Apply from neck down before bed then wash off in the morning. May repeat in one week.    Dispense:  60 g    Refill:  1  . predniSONE (STERAPRED UNI-PAK 21 TAB) 10 MG (21) TBPK tablet    Sig: Take by mouth daily. Take as directed.    Dispense:  21 tablet    Refill:  0   May f/u here if not seeing improvement with the above therapy or with any worsening symptoms. Other than the possibility of scabies, reassured this is not contagious.  Reviewed expectations re: course of current medical issues. Questions answered. Outlined signs and symptoms indicating need for more acute intervention. Patient verbalized understanding. After Visit Summary given.   SUBJECTIVE:  Gary [name] is a 41 y.o. male who presents with a skin complaint.   Location: mainly mid to lower back Onset: gradual Duration: has noticed over the past month Associated pruritis? Yes; moderate; lower back and groin mainly. Associated pain? none Progression: stable  Drainage? No  Known trigger? No  New soaps/lotions/topicals/detergents/environmental exposures? No Contacts with similar? No Recent travel? No  Other associated symptoms: none Therapies tried thus far: none Arthralgia or myalgia? none Recent illness? none Fever? none No specific aggravating or alleviating factors reported. No abdominal pain or back pain. No h/o similar.  ROS: As per HPI. All other systems negative.   OBJECTIVE: Vitals:   05/21/18 1007 05/21/18 1009  BP:  105/65  Pulse:  (!) 54  Resp:  16  Temp:  97.6 F (36.4  C)  TempSrc:  Oral  SpO2:  99%  Weight: 54.4 kg     General appearance: alert; no distress HEENT: [place]; AT; no oral lesions Neck: no LAD; FROM Lungs: clear to auscultation bilaterally Heart: regular rate and rhythm Extremities: no edema GU: declined Back: no tenderness to palpation over lower back; FROM at waist Skin: warm and dry; signs of infection: no; crops of oval, sharply delimited, skin colored papules on lower back oriented along skin lines; also a few on his upper extremities; none on neck or face; none visible on lower extremities Psychological: alert and cooperative; normal mood and affect  Allergies  Allergen Reactions  . Aspirin     Internal bleeding     Past Medical History:  Diagnosis Date  . CHF (congestive heart failure) (HCC)   . Dysrhythmia    hx. of  A-fib  . Internal bleeding    DUE TO ASPIRIN, OR MEDS  . Murmur   . Smoker    Social History   Socioeconomic History  . Marital status: Single    Spouse name: Not on file  . Number of children: Not on file  . Years of education: Not on file  . Highest education level: Not on file  Occupational History  . Not on file  Social Needs  . Financial resource strain: Not on file  . Food insecurity:    Worry: Not on file    Inability: Not on file  . Transportation needs:    Medical: Not on file  Non-medical: Not on file  Tobacco Use  . Smoking status: Former Smoker    Packs/day: 1.00    Years: 20.00    Pack years: 20.00    Types: Cigarettes  . Smokeless tobacco: Never Used  Substance and Sexual Activity  . Alcohol use: Not Currently    Comment: social  . Drug use: Not Currently    Types: Marijuana    Comment: last usage 3 months ago  . Sexual activity: Not on file  Lifestyle  . Physical activity:    Days per week: Not on file    Minutes per session: Not on file  . Stress: Not on file  Relationships  . Social connections:    Talks on phone: Not on file    Gets together: Not on file     Attends religious service: Not on file    Active member of club or organization: Not on file    Attends meetings of clubs or organizations: Not on file    Relationship status: Not on file  . Intimate partner violence:    Fear of current or ex partner: Not on file    Emotionally abused: Not on file    Physically abused: Not on file    Forced sexual activity: Not on file  Other Topics Concern  . Not on file  Social History Narrative  . Not on file   History reviewed. No pertinent family history. Past Surgical History:  Procedure Laterality Date  . ABDOMINAL SURGERY    . ABDOMINAL SURGERY     under 10 yrs. old or younger  . CARDIAC SURGERY    . CARDIOVERSION N/A 08/17/2017   Procedure: CARDIOVERSION;  Surgeon: Orpah Cobb, MD;  Location: [facility] ENDOSCOPY;  Service: Cardiovascular;  Laterality: N/A;  . HERNIA REPAIR    . INGUINAL HERNIA REPAIR Right 10/13/2017   Procedure: OPEN RIGHT INGUINAL HERNIA REPAIR ERAS PATHWAY;  Surgeon: Jimmye Norman, MD;  Location: [facility] OR;  Service: General;  Laterality: Right;  . OPEN HEART SURGERY     AS A CHILD , pt. thinks he was under 10 yrs. old. done in [place]  . TEE WITHOUT CARDIOVERSION N/A 08/17/2017   Procedure: TRANSESOPHAGEAL ECHOCARDIOGRAM (TEE);  Surgeon: Orpah Cobb, MD;  Location: [facility] ENDOSCOPY;  Service: Cardiovascular;  Laterality: N/A;     Mardella Layman, MD 05/21/18 1130

## 2018-08-30 ENCOUNTER — Other Ambulatory Visit: Payer: Self-pay

## 2018-08-30 ENCOUNTER — Ambulatory Visit (HOSPITAL_COMMUNITY)
Admission: EM | Admit: 2018-08-30 | Discharge: 2018-08-30 | Disposition: A | Discharge status: Home / Self Care | Payer: Medicaid Other | Attending: Family Medicine | Admitting: Family Medicine

## 2018-08-30 ENCOUNTER — Encounter (HOSPITAL_COMMUNITY): Payer: Self-pay | Admitting: Emergency Medicine

## 2018-08-30 DIAGNOSIS — L853 Xerosis cutis: Secondary | ICD-10-CM | POA: Diagnosis not present

## 2018-08-30 MED ORDER — FLUOCINONIDE 0.05 % EX CREA: 1.0000 "application " | TOPICAL_CREAM | Freq: Two times a day (BID) | CUTANEOUS | 0 refills | Status: DC

## 2018-08-30 NOTE — ED Triage Notes (Signed)
Patient reports hands are dry and itching.  Onset 2 months ago of this episode.

## 2018-08-30 NOTE — ED Provider Notes (Signed)
[facility] CARE CENTER   [id number] 08/30/18 Arrival Time: 1214  ASSESSMENT & PLAN:  1. Dry skin    Trial of: Meds ordered this encounter  Medications  . fluocinonide cream (LIDEX) 0.05 %    Sig: Apply 1 application topically 2 (two) times daily.    Dispense:  30 g    Refill:  0   Recommend frequent use of moisturizing cream. Do not use bleach in water with dish washing. Wear gloves.  Will follow up with PCP or here if worsening or failing to improve as anticipated. Reviewed expectations re: course of current medical issues. Questions answered. Outlined signs and symptoms indicating need for more acute intervention. Patient verbalized understanding. After Visit Summary given.   SUBJECTIVE:  Gary [name] is a 43 y.o. male who presents with a skin complaint.   Location: bilateral hands Onset: gradual Duration: at least two months Associated pruritis? Yes; mild to moderate Associated pain? none Progression: fluctuating a bit  Drainage? No  Known trigger? No  New soaps/lotions/topicals/detergents/environmental exposures? No Contacts with similar? No Recent travel? No  Other associated symptoms: none Therapies tried thus far: none Arthralgia or myalgia? none Recent illness? none Fever? none No specific aggravating or alleviating factors reported.  ROS: As per HPI.  OBJECTIVE: Vitals:   08/30/18 1234  BP: (!) 95/50  Pulse: 62  Resp: 18  Temp: (!) 97.5 F (36.4 C)  TempSrc: Oral  SpO2: 97%    General appearance: alert; no distress Lungs: clear to auscultation bilaterally Heart: regular rate and rhythm Extremities: no edema Skin: warm and dry; very dry and cracked skin over bilateral hands; no signs of infection Psychological: alert and cooperative; normal mood and affect  Allergies  Allergen Reactions  . Aspirin     Internal bleeding     Past Medical History:  Diagnosis Date  . CHF (congestive heart failure) (HCC)   . Dysrhythmia    hx. of   A-fib  . Internal bleeding    DUE TO ASPIRIN, OR MEDS  . Murmur   . Smoker    Social History   Socioeconomic History  . Marital status: Single    Spouse name: Not on file  . Number of children: Not on file  . Years of education: Not on file  . Highest education level: Not on file  Occupational History  . Not on file  Social Needs  . Financial resource strain: Not on file  . Food insecurity:    Worry: Not on file    Inability: Not on file  . Transportation needs:    Medical: Not on file    Non-medical: Not on file  Tobacco Use  . Smoking status: Former Smoker    Packs/day: 1.00    Years: 20.00    Pack years: 20.00    Types: Cigarettes  . Smokeless tobacco: Never Used  Substance and Sexual Activity  . Alcohol use: Not Currently    Comment: social  . Drug use: Not Currently    Types: Marijuana    Comment: last usage 3 months ago  . Sexual activity: Not on file  Lifestyle  . Physical activity:    Days per week: Not on file    Minutes per session: Not on file  . Stress: Not on file  Relationships  . Social connections:    Talks on phone: Not on file    Gets together: Not on file    Attends religious service: Not on file    Active member  of club or organization: Not on file    Attends meetings of clubs or organizations: Not on file    Relationship status: Not on file  . Intimate partner violence:    Fear of current or ex partner: Not on file    Emotionally abused: Not on file    Physically abused: Not on file    Forced sexual activity: Not on file  Other Topics Concern  . Not on file  Social History Narrative  . Not on file   History reviewed. No pertinent family history. Past Surgical History:  Procedure Laterality Date  . ABDOMINAL SURGERY    . ABDOMINAL SURGERY     under 10 yrs. old or younger  . CARDIAC SURGERY    . CARDIOVERSION N/A 08/17/2017   Procedure: CARDIOVERSION;  Surgeon: Orpah Cobb, MD;  Location: [facility] ENDOSCOPY;  Service: Cardiovascular;   Laterality: N/A;  . HERNIA REPAIR    . INGUINAL HERNIA REPAIR Right 10/13/2017   Procedure: OPEN RIGHT INGUINAL HERNIA REPAIR ERAS PATHWAY;  Surgeon: Jimmye Norman, MD;  Location: [facility] OR;  Service: General;  Laterality: Right;  . OPEN HEART SURGERY     AS A CHILD , pt. thinks he was under 9 yrs. old. done in [place]  . TEE WITHOUT CARDIOVERSION N/A 08/17/2017   Procedure: TRANSESOPHAGEAL ECHOCARDIOGRAM (TEE);  Surgeon: Orpah Cobb, MD;  Location: [facility] ENDOSCOPY;  Service: Cardiovascular;  Laterality: N/A;     Mardella Layman, MD 09/04/18 [phone]

## 2018-09-17 ENCOUNTER — Ambulatory Visit (HOSPITAL_COMMUNITY)
Admission: EM | Admit: 2018-09-17 | Discharge: 2018-09-17 | Disposition: A | Discharge status: Home / Self Care | Payer: Medicaid Other | Attending: Emergency Medicine | Admitting: Emergency Medicine

## 2018-09-17 ENCOUNTER — Other Ambulatory Visit: Payer: Self-pay

## 2018-09-17 ENCOUNTER — Encounter (HOSPITAL_COMMUNITY): Payer: Self-pay | Admitting: Emergency Medicine

## 2018-09-17 ENCOUNTER — Ambulatory Visit (INDEPENDENT_AMBULATORY_CARE_PROVIDER_SITE_OTHER): Payer: Medicaid Other

## 2018-09-17 DIAGNOSIS — R0789 Other chest pain: Secondary | ICD-10-CM

## 2018-09-17 DIAGNOSIS — R0602 Shortness of breath: Secondary | ICD-10-CM | POA: Diagnosis not present

## 2018-09-17 NOTE — ED Triage Notes (Signed)
Pt cc has a SOB and was told years ago that he had water on his lung.

## 2018-09-17 NOTE — ED Notes (Signed)
Pt was discharged and advised to go to the ER. Pt states he's going to check on his kid before he goes to the ER.

## 2018-09-17 NOTE — Discharge Instructions (Addendum)
You will need to go straight to the ER for further testing to rule out an heart attack . The nurses will call the charge nurse there. You have opted to have someone take you instead of transport.

## 2018-09-17 NOTE — ED Provider Notes (Signed)
[facility]    CSN: [id number] Arrival date & time: 09/17/18  5170     History   Chief Complaint Chief Complaint  Patient presents with  . Shortness of Breath    HPI Gary [name] is a 43 y.o. male.   Pt is concerned due to he is on coumadin with a hx of CHF. Has noticed this week increase sob not able to walk long distance with some intermit chest pain mid chest. States that he does smoke and feels that it is getting worse. Seen his cardiologist 3 weeks ago for coumadin check. Does not have any swelling to hands and legs. Has only taken his daily meds pta.      Past Medical History:  Diagnosis Date  . CHF (congestive heart failure) (HCC)   . Dysrhythmia    hx. of  A-fib  . Internal bleeding    DUE TO ASPIRIN, OR MEDS  . Murmur   . Smoker     Patient Active Problem List   Diagnosis Date Noted  . Protein-calorie malnutrition, severe 10/17/2017  . Incarcerated inguinal hernia 10/13/2017  . Acute congestive heart failure (HCC) 08/14/2017    Past Surgical History:  Procedure Laterality Date  . ABDOMINAL SURGERY    . ABDOMINAL SURGERY     under 10 yrs. old or younger  . CARDIAC SURGERY    . CARDIOVERSION N/A 08/17/2017   Procedure: CARDIOVERSION;  Surgeon: Orpah Cobb, MD;  Location: [facility] ENDOSCOPY;  Service: Cardiovascular;  Laterality: N/A;  . HERNIA REPAIR    . INGUINAL HERNIA REPAIR Right 10/13/2017   Procedure: OPEN RIGHT INGUINAL HERNIA REPAIR ERAS PATHWAY;  Surgeon: Jimmye Norman, MD;  Location: [facility] OR;  Service: General;  Laterality: Right;  . OPEN HEART SURGERY     AS A CHILD , pt. thinks he was under 9 yrs. old. done in [place]  . TEE WITHOUT CARDIOVERSION N/A 08/17/2017   Procedure: TRANSESOPHAGEAL ECHOCARDIOGRAM (TEE);  Surgeon: Orpah Cobb, MD;  Location: [facility] ENDOSCOPY;  Service: Cardiovascular;  Laterality: N/A;       Home Medications    Prior to Admission medications   Medication Sig Start Date End Date Taking? Authorizing Provider    amiodarone (PACERONE) 200 MG tablet Take 1 tablet (200 mg total) by mouth daily. Patient taking differently: Take 100 mg by mouth daily.  08/22/17   Rinaldo Cloud, MD  docusate sodium (COLACE) 100 MG capsule Take 1 capsule (100 mg total) by mouth 2 (two) times daily as needed for mild constipation. 10/26/17   Blue, Olivia C, PA-C  fluocinonide cream (LIDEX) 0.05 % Apply 1 application topically 2 (two) times daily. 08/30/18   Mardella Layman, MD  losartan (COZAAR) 25 MG tablet Take 1 tablet (25 mg total) by mouth daily. 08/22/17   Rinaldo Cloud, MD  oxyCODONE (OXY IR/ROXICODONE) 5 MG immediate release tablet Take 1-2 tablets (5-10 mg total) by mouth every 4 (four) hours as needed for moderate pain. Patient not taking: Reported on 05/21/2018 10/18/17   Jimmye Norman, MD  warfarin (COUMADIN) 5 MG tablet Take 1 tablet (5 mg total) by mouth one time only at 6 PM. Patient taking differently: Take 2.5-5 mg by mouth daily at 6 PM. Take 5 mg by mouth once daily at 6pm except Sunday take 2.5 mg 08/21/17   Rinaldo Cloud, MD    Family History History reviewed. No pertinent family history.  Social History Social History   Tobacco Use  . Smoking status: Former Smoker  Packs/day: 1.00    Years: 20.00    Pack years: 20.00    Types: Cigarettes  . Smokeless tobacco: Never Used  Substance Use Topics  . Alcohol use: Not Currently    Comment: social  . Drug use: Not Currently    Types: Marijuana    Comment: last usage 3 months ago     Allergies   Aspirin   Review of Systems Review of Systems  Constitutional: Negative.   HENT: Negative.   Respiratory: Positive for cough and shortness of breath.   Cardiovascular: Positive for chest pain.  Gastrointestinal: Negative.   Genitourinary: Negative.   Musculoskeletal: Negative.   Skin: Negative.   Neurological: Negative.      Physical Exam Triage Vital Signs ED Triage Vitals  Enc Vitals Group     BP 09/17/18 0956 113/62     Pulse Rate  09/17/18 0956 66     Resp 09/17/18 0956 16     Temp 09/17/18 0956 97.9 F (36.6 C)     Temp Source 09/17/18 0956 Oral     SpO2 09/17/18 0956 100 %     Weight 09/17/18 1003 140 lb (63.5 kg)     Height --      Head Circumference --      Peak Flow --      Pain Score 09/17/18 1003 8     Pain Loc --      Pain Edu? --      Excl. in GC? --    No data found.  Updated Vital Signs BP 113/62 (BP Location: Left Arm)   Pulse 66   Temp 97.9 F (36.6 C) (Oral)   Resp 16   Wt 140 lb (63.5 kg)   SpO2 100%   BMI 23.30 kg/m   Visual Acuity     Physical Exam Pulmonary:     Comments: Wheezing to bil lower bases,  Chest:     Comments: Mid chest pain intermit , sob with walking distance and up hill  Abdominal:     Palpations: Abdomen is soft.  Skin:    General: Skin is warm.  Neurological:     General: No focal deficit present.     Mental Status: He is alert.      UC Treatments / Results  Labs (all labs ordered are listed, but only abnormal results are displayed) Labs Reviewed - No data to display  EKG None  Radiology No results found.  Procedures Procedures (including critical care time)  Medications Ordered in UC Medications - No data to display  Initial Impression / Assessment and Plan / UC Course  I have reviewed the triage vital signs and the nursing notes.  Pertinent labs & imaging results that were available during my care of the patient were reviewed by me and considered in my medical decision making (see chart for details).    Discussed with pt that speaking with MD Lampty about pt it is recommended that pt should go to the ER. His chest x ray is showing enlargement of the heart and the ekg is not normal comparable to previous ekg. . Educated pt that he needs to go to the er he states that his ride will take him that he did not need transport . Discussed about the risk of having an MI. And the risk if not going.  Reviewed previous charts and ekgs.  Final  Clinical Impressions(s) / UC Diagnoses   Final diagnoses:  None   Discharge Instructions  None    ED Prescriptions    None     Controlled Substance Prescriptions [place] Controlled Substance Registry consulted? Not Applicable   Coralyn Mariusz, NP 09/17/18 1049

## 2018-09-21 ENCOUNTER — Other Ambulatory Visit: Payer: Self-pay

## 2018-09-21 ENCOUNTER — Emergency Department (HOSPITAL_COMMUNITY): Payer: Medicaid Other

## 2018-09-21 ENCOUNTER — Emergency Department (HOSPITAL_COMMUNITY)
Admission: EM | Admit: 2018-09-21 | Discharge: 2018-09-21 | Disposition: A | Discharge status: Home / Self Care | Payer: Medicaid Other | Attending: Emergency Medicine | Admitting: Emergency Medicine

## 2018-09-21 ENCOUNTER — Encounter (HOSPITAL_COMMUNITY): Payer: Self-pay | Admitting: Emergency Medicine

## 2018-09-21 DIAGNOSIS — Z79899 Other long term (current) drug therapy: Secondary | ICD-10-CM | POA: Diagnosis not present

## 2018-09-21 DIAGNOSIS — R0602 Shortness of breath: Secondary | ICD-10-CM | POA: Diagnosis present

## 2018-09-21 DIAGNOSIS — I509 Heart failure, unspecified: Secondary | ICD-10-CM | POA: Diagnosis not present

## 2018-09-21 DIAGNOSIS — R0609 Other forms of dyspnea: Secondary | ICD-10-CM

## 2018-09-21 DIAGNOSIS — Z7901 Long term (current) use of anticoagulants: Secondary | ICD-10-CM | POA: Diagnosis not present

## 2018-09-21 DIAGNOSIS — F17210 Nicotine dependence, cigarettes, uncomplicated: Secondary | ICD-10-CM | POA: Diagnosis not present

## 2018-09-21 DIAGNOSIS — R0600 Dyspnea, unspecified: Secondary | ICD-10-CM | POA: Diagnosis not present

## 2018-09-21 LAB — CBC
HCT: 40.8 % (ref 39.0–52.0)
Hemoglobin: 13.3 g/dL (ref 13.0–17.0)
MCH: 30.1 pg (ref 26.0–34.0)
MCHC: 32.6 g/dL (ref 30.0–36.0)
MCV: 92.3 fL (ref 80.0–100.0)
Platelets: 166 10*3/uL (ref 150–400)
RBC: 4.42 MIL/uL (ref 4.22–5.81)
RDW: 12 % (ref 11.5–15.5)
WBC: 6.5 10*3/uL (ref 4.0–10.5)
nRBC: 0 % (ref 0.0–0.2)

## 2018-09-21 LAB — BASIC METABOLIC PANEL
Anion gap: 7 (ref 5–15)
BUN: 11 mg/dL (ref 6–20)
CO2: 22 mmol/L (ref 22–32)
Calcium: 8.8 mg/dL — ABNORMAL LOW (ref 8.9–10.3)
Chloride: 107 mmol/L (ref 98–111)
Creatinine, Ser: 0.9 mg/dL (ref 0.61–1.24)
Glucose, Bld: 102 mg/dL — ABNORMAL HIGH (ref 70–99)
Potassium: 4.8 mmol/L (ref 3.5–5.1)
Sodium: 136 mmol/L (ref 135–145)

## 2018-09-21 LAB — BRAIN NATRIURETIC PEPTIDE: B NATRIURETIC PEPTIDE 5: 734.2 pg/mL — AB (ref 0.0–100.0)

## 2018-09-21 LAB — I-STAT TROPONIN, ED: Troponin i, poc: 0.07 ng/mL (ref 0.00–0.08)

## 2018-09-21 MED ORDER — FUROSEMIDE 20 MG PO TABS: 20.0000 mg | ORAL_TABLET | Freq: Every day | ORAL | 0 refills | Status: DC

## 2018-09-21 MED ORDER — FUROSEMIDE 20 MG PO TABS
20.0000 mg | ORAL_TABLET | Freq: Once | ORAL | Status: AC
  Administered 2018-09-21: 20 mg via ORAL
  Filled 2018-09-21: qty 1

## 2018-09-21 NOTE — Discharge Instructions (Signed)
Your work-up today overall is reassuring.  Your x-ray shows a small amount of fluid on your lungs but this is actually improved from your chest x-ray on Monday.  I would like for you to start taking Lasix 20 mg once daily, please follow-up with Dr. Sharyn Lull on Monday as planned.  Return to the emergency department if you have worsening shortness of breath, chest pain if you feel as though you are going to pass out you notice lots of swelling in your legs or any other new or concerning symptoms.

## 2018-09-21 NOTE — ED Notes (Signed)
Pt given discharge instructions and follow up information. Pt given prescription and the opportunity to ask questions. Pt verbalized understanding. Pt discharged without incident.

## 2018-09-21 NOTE — ED Notes (Signed)
Pt ambulatory with no assistance. Pt SpO2 between 93% and 100% while ambulating. Pt stated he felt like normal when he walked.

## 2018-09-21 NOTE — ED Triage Notes (Signed)
Patient reports he was told to come to ED from [facility] on Monday - was evaluated there for shortness of breath off an on, more so with exertion. He has history of CHF, A-Fib with cardioversion, heart murmur. Had CXR done at [facility] that showed "fluid around the heart." He denies CP, shortness of breath, dizziness, N/V. Resp e/u, skin w/d.

## 2018-09-21 NOTE — ED Provider Notes (Signed)
[facility] EMERGENCY DEPARTMENT Provider Note   CSN: [id number] Arrival date & time: 09/21/18  1007    History   Chief Complaint Chief Complaint  Patient presents with  . Shortness of Breath    HPI Gary [name] is a 43 y.o. male.     Gary [name] is a 43 y.o. male history of CHF, heart murmur, A. fib and smoking, who presents to the emergency department for evaluation of shortness of breath.  He reports he initially presented to urgent care on 3/9, they completed a chest x-ray which showed an enlarged heart and some possible fluid in the lower lung and he was recommended to come to the emergency department, but patient reports that he had to go home to take care of his children.  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  He denies associated chest pain.  Chest pain is also present intermittently at night when he tries to lay flat.  He denies any lower extremity swelling or pain.  No cough or fever/chills.     Past Medical History:  Diagnosis Date  . CHF (congestive heart failure) (HCC)   . Dysrhythmia    hx. of  A-fib  . Internal bleeding    DUE TO ASPIRIN, OR MEDS  . Murmur   . Smoker     Patient Active Problem List   Diagnosis Date Noted  . Protein-calorie malnutrition, severe 10/17/2017  . Incarcerated inguinal hernia 10/13/2017  . Acute congestive heart failure (HCC) 08/14/2017    Past Surgical History:  Procedure Laterality Date  . ABDOMINAL SURGERY    . ABDOMINAL SURGERY     under 10 yrs. old or younger  . CARDIAC SURGERY    . CARDIOVERSION N/A 08/17/2017   Procedure: CARDIOVERSION;  Surgeon: Orpah Cobb, MD;  Location: [facility] ENDOSCOPY;  Service: Cardiovascular;  Laterality: N/A;  . HERNIA REPAIR    . INGUINAL HERNIA REPAIR Right 10/13/2017    Procedure: OPEN RIGHT INGUINAL HERNIA REPAIR ERAS PATHWAY;  Surgeon: Jimmye Norman, MD;  Location: [facility] OR;  Service: General;  Laterality: Right;  . OPEN HEART SURGERY     AS A CHILD , pt. thinks he was under 10 yrs. old. done in [place]  . TEE WITHOUT CARDIOVERSION N/A 08/17/2017   Procedure: TRANSESOPHAGEAL ECHOCARDIOGRAM (TEE);  Surgeon: Orpah Cobb, MD;  Location: [facility] ENDOSCOPY;  Service: Cardiovascular;  Laterality: N/A;        Home Medications    Prior to Admission medications   Medication Sig Start Date End Date Taking? Authorizing Provider  amiodarone (PACERONE) 200 MG tablet Take 1 tablet (200 mg total) by mouth daily. Patient taking differently: Take 100 mg by mouth daily.  08/22/17  Yes Rinaldo Cloud, MD  losartan (COZAAR) 25 MG tablet Take 1 tablet (25 mg total) by mouth daily. 08/22/17  Yes Rinaldo Cloud, MD  warfarin (COUMADIN) 5 MG tablet Take 1 tablet (5 mg total) by mouth one time only at 6 PM. Patient taking differently: Take 2.5-5 mg by mouth daily at 6 PM. Take 5 mg by mouth once daily at 6pm except Sunday take 2.5 mg  M W F - 7.5MG -- All other days is 5MG  08/21/17  Yes Rinaldo Cloud, MD  docusate sodium (COLACE) 100 MG capsule Take 1 capsule (100 mg total)  by mouth 2 (two) times daily as needed for mild constipation. Patient not taking: Reported on 09/21/2018 10/26/17   Blue, Olivia C, PA-C  fluocinonide cream (LIDEX) 0.05 % Apply 1 application topically 2 (two) times daily. Patient not taking: Reported on 09/21/2018 08/30/18   Mardella Layman, MD  furosemide (LASIX) 20 MG tablet Take 1 tablet (20 mg total) by mouth daily. 09/21/18   Dartha Lodge, PA-C  oxyCODONE (OXY IR/ROXICODONE) 5 MG immediate release tablet Take 1-2 tablets (5-10 mg total) by mouth every 4 (four) hours as needed for moderate pain. Patient not taking: Reported on 05/21/2018 10/18/17   Jimmye Norman, MD    Family History No family history on file.  Social History Social History   Tobacco Use  .  Smoking status: Current Every Day Smoker    Packs/day: 1.00    Years: 20.00    Pack years: 20.00    Types: Cigarettes  . Smokeless tobacco: Never Used  Substance Use Topics  . Alcohol use: Not Currently    Comment: social  . Drug use: Not Currently    Types: Marijuana    Comment: last usage 3 months ago     Allergies   Aspirin   Review of Systems Review of Systems   Physical Exam Updated Vital Signs BP 120/71 (BP Location: Right Arm)   Pulse 67   Temp 97.8 F (36.6 C)   Resp (!) 23   SpO2 100%   Physical Exam Constitutional:      General: He is not in acute distress.    Appearance: He is well-developed and normal weight. He is not ill-appearing or diaphoretic.  HENT:     Head: Normocephalic and atraumatic.     Mouth/Throat:     Pharynx: Oropharynx is clear.  Neck:     Musculoskeletal: Neck supple.     Vascular: No JVD.     Trachea: No tracheal deviation.  Cardiovascular:     Rate and Rhythm: Normal rate and regular rhythm.     Heart sounds: Murmur present. No friction rub. No gallop.      Comments: 3/6 systolic murmur Pulmonary:     Effort: Pulmonary effort is normal. No tachypnea.     Breath sounds: Normal breath sounds. No decreased breath sounds, wheezing, rhonchi or rales.     Comments: Respirations equal and unlabored, patient able to speak in full sentences, lungs clear to auscultation bilaterally Chest:     Chest wall: No tenderness.  Abdominal:     General: Bowel sounds are normal.     Palpations: Abdomen is soft. There is no hepatomegaly or mass.     Tenderness: There is no abdominal tenderness. There is no guarding.     Comments: Abdomen soft, nondistended, nontender to palpation in all quadrants without guarding or peritoneal signs  Musculoskeletal:     Right lower leg: He exhibits no tenderness. No edema.     Left lower leg: He exhibits no tenderness. No edema.  Skin:    General: Skin is warm and dry.     Capillary Refill: Capillary refill  takes 2 to 3 seconds.  Neurological:     Mental Status: He is alert and oriented to person, place, and time.     Comments: A&O x 4, speech clear, following commands Moving all extremities without difficulty, normal coordination  Psychiatric:        Mood and Affect: Mood normal.        Behavior: Behavior normal.  ED Treatments / Results  Labs (all labs ordered are listed, but only abnormal results are displayed) Labs Reviewed  BASIC METABOLIC PANEL - Abnormal; Notable for the following components:      Result Value   Glucose, Bld 102 (*)    Calcium 8.8 (*)    All other components within normal limits  BRAIN NATRIURETIC PEPTIDE - Abnormal; Notable for the following components:   B Natriuretic Peptide 734.2 (*)    All other components within normal limits  CBC  I-STAT TROPONIN, ED    EKG EKG Interpretation  Date/Time:  Friday September 21 2018 10:15:38 EDT Ventricular Rate:  65 PR Interval:    QRS Duration: 112 QT Interval:  460 QTC Calculation: 479 R Axis:   10 Text Interpretation:  Sinus rhythm Prolonged PR interval LVH with IVCD and secondary repol abnrm Confirmed by Cathren Laine ([id number]) on 09/21/2018 10:37:49 AM   Radiology Dg Chest 2 View  Result Date: 09/21/2018 CLINICAL DATA:  Shortness of breath. EXAM: CHEST - 2 VIEW COMPARISON:  Chest x-rays dated 09/17/2018 and 08/14/2017 and cardiac MRI dated 08/17/2017 FINDINGS: There is chronic cardiomegaly with an abnormal contour of the left heart border due to a dilated left atrial appendage and prominent main pulmonary artery. Overall pulmonary vascularity is normal. Kerley B-lines are present at both lung bases consistent with mild interstitial edema, less prominent than on the prior study. Mild thoracic scoliosis, unchanged.  No pleural effusions. IMPRESSION: 1. Mild interstitial pulmonary edema, less prominent than on 09/17/2018. 2. Chronic cardiomegaly as described. Electronically Signed   By: Francene Boyers M.D.   On:  09/21/2018 10:55    Procedures Procedures (including critical care time)  Medications Ordered in ED Medications  furosemide (LASIX) tablet 20 mg (20 mg Oral Given 09/21/18 1155)     Initial Impression / Assessment and Plan / ED Course  I have reviewed the triage vital signs and the nursing notes.  Pertinent labs & imaging results that were available during my care of the patient were reviewed by me and considered in my medical decision making (see chart for details).  Patient presents for evaluation of exertional dyspnea.  This was occurring most notably on Monday of this week and has improved over the week.  Patient has known history of heart failure.  Does not have lower extremity edema and lungs are clear on auscultation today.  Suspect patient may have mild fluid overload.  Chest x-ray completed at urgent care on 3/9 showed mild interstitial pulmonary edema.  Patient is not currently on Lasix.  On arrival he has normal vitals.  No hypoxia or increased work of breathing.  Will check basic labs, troponin, EKG, chest x-ray and BNP.  Patient is followed by Dr. Sharyn Lull with cardiology.  Labs overall reassuring, EKG shows normal sinus rhythm with some signs of prolonged PR and LVH.  Negative troponin.  No leukocytosis, normal hemoglobin, no acute electrolyte derangements and normal renal function.  BNP is elevated at 734.2 today although this seems to be consistent with patient's baseline, and BNP has been in the thousands previously.  Chest x-ray shows mild interstitial pulmonary edema but this is improved compared to chest x-ray on 3/9, chest x-ray reviewed by myself.  Chronic cardiomegaly which is stable.  Patient ambulated in the department with no hypoxia and felt well while up walking around.  Will start patient on 20 mg of Lasix.  Dr. Sharyn Lull called to check in on patient while he was here in the department and  has scheduled him for a follow-up appointment in 3 days on Monday morning.  I have  discussed return precautions with the patient he expresses understanding and agreement with plan.  Feel he is stable for discharge home at this time.  Final Clinical Impressions(s) / ED Diagnoses   Final diagnoses:  Acute on chronic congestive heart failure, unspecified heart failure type (HCC)  Dyspnea on exertion    ED Discharge Orders         Ordered    furosemide (LASIX) 20 MG tablet  Daily     09/21/18 1247           Dartha Lodge, [place] 09/21/18 1326    Cathren Laine, MD 09/21/18 1343

## 2018-10-21 ENCOUNTER — Encounter (HOSPITAL_COMMUNITY): Payer: Self-pay | Admitting: *Deleted

## 2018-10-21 ENCOUNTER — Other Ambulatory Visit: Payer: Self-pay

## 2018-10-21 ENCOUNTER — Ambulatory Visit (HOSPITAL_COMMUNITY)
Admission: EM | Admit: 2018-10-21 | Discharge: 2018-10-21 | Disposition: A | Discharge status: Home / Self Care | Payer: Medicaid Other | Attending: Family Medicine | Admitting: Family Medicine

## 2018-10-21 DIAGNOSIS — I4891 Unspecified atrial fibrillation: Secondary | ICD-10-CM | POA: Diagnosis not present

## 2018-10-21 HISTORY — DX: Unspecified atrial fibrillation: I48.91

## 2018-10-21 LAB — PROTIME-INR
INR: 3.4 — ABNORMAL HIGH (ref 0.8–1.2)
Prothrombin Time: 33.8 seconds — ABNORMAL HIGH (ref 11.4–15.2)

## 2018-10-21 NOTE — Discharge Instructions (Signed)
No alarming signs on exam. Your EKG showed that you are in Afib, but otherwise stable. We are checking your INR today, will contact you when the results come in. Please call your cardiologist tomorrow for further evaluation and management needed. If experiencing chest pain, shortness of breath, weakness, dizziness, go to the emergency department for further evaluation needed.

## 2018-10-21 NOTE — ED Notes (Signed)
EKG shown to A. Yu, PA. 

## 2018-10-21 NOTE — ED Provider Notes (Signed)
[facility]    CSN: [id number] Arrival date & time: 10/21/18  1030     History   Chief Complaint Chief Complaint  Patient presents with  . Tachycardia    HPI Gary [name] is a 43 y.o. male.   44 year old male with history of CHF, afib comes in for 1 week history of intermittent palpitations.  Palpitations mainly at nighttime, usually last a few minutes.  Denies associated shortness of breath, weakness, dizziness, syncope.  He states had experienced a few episodes of shooting pain across the left chest that is not associated with palpitations or activity.  Has found relief of chest pain since refraining from smoking.  Denies orthopnea, leg swelling. Denies exertional chest pain. States has occasional fatigue with exertion, but associates it to days of disturbed sleep. Denies consistent exertional fatigue. Work requires heavy lifting, and has been able to perform duty without problems.  He was last seen by cardiology 09/28/2018, states amiodarone was changed to 100 mg for 5 days, and 2 days off.  He thinks his last INR was checked 1 month ago, no changes in dosages.  He has been watching his salt intake. Has continued lasix and losartan as directed.  States he had a friend died from stroke recently, got worried, and came in for evaluation.  He has continued to take his warfarin as directed.     Past Medical History:  Diagnosis Date  . Atrial fibrillation (HCC)   . CHF (congestive heart failure) (HCC)   . Dysrhythmia    hx. of  A-fib  . Internal bleeding    DUE TO ASPIRIN, OR MEDS  . Murmur   . Smoker     Patient Active Problem List   Diagnosis Date Noted  . Protein-calorie malnutrition, severe 10/17/2017  . Incarcerated inguinal hernia 10/13/2017  . Acute congestive heart failure (HCC) 08/14/2017    Past Surgical History:  Procedure Laterality Date  . ABDOMINAL SURGERY    . ABDOMINAL SURGERY     under 10 yrs. old or younger  . CARDIAC SURGERY    .  CARDIOVERSION N/A 08/17/2017   Procedure: CARDIOVERSION;  Surgeon: Orpah Cobb, MD;  Location: [facility] ENDOSCOPY;  Service: Cardiovascular;  Laterality: N/A;  . HERNIA REPAIR    . INGUINAL HERNIA REPAIR Right 10/13/2017   Procedure: OPEN RIGHT INGUINAL HERNIA REPAIR ERAS PATHWAY;  Surgeon: Jimmye Norman, MD;  Location: [facility] OR;  Service: General;  Laterality: Right;  . OPEN HEART SURGERY     AS A CHILD , pt. thinks he was under 10 yrs. old. done in [place]  . TEE WITHOUT CARDIOVERSION N/A 08/17/2017   Procedure: TRANSESOPHAGEAL ECHOCARDIOGRAM (TEE);  Surgeon: Orpah Cobb, MD;  Location: [facility] ENDOSCOPY;  Service: Cardiovascular;  Laterality: N/A;       Home Medications    Prior to Admission medications   Medication Sig Start Date End Date Taking? Authorizing Provider  amiodarone (PACERONE) 200 MG tablet Take 1 tablet (200 mg total) by mouth daily. Patient taking differently: Take 100 mg by mouth daily.  08/22/17  Yes Rinaldo Cloud, MD  furosemide (LASIX) 20 MG tablet Take 1 tablet (20 mg total) by mouth daily. 09/21/18  Yes Dartha Lodge, PA-C  losartan (COZAAR) 25 MG tablet Take 1 tablet (25 mg total) by mouth daily. 08/22/17  Yes Rinaldo Cloud, MD  warfarin (COUMADIN) 5 MG tablet Take 1 tablet (5 mg total) by mouth one time only at 6 PM. Patient taking differently: Take 2.5-5 mg  by mouth daily at 6 PM. Take 5 mg by mouth once daily at 6pm except Sunday take 2.5 mg  M W F - 7.5MG -- All other days is 5MG  08/21/17  Yes Rinaldo Cloud, MD  docusate sodium (COLACE) 100 MG capsule Take 1 capsule (100 mg total) by mouth 2 (two) times daily as needed for mild constipation. Patient not taking: Reported on 09/21/2018 10/26/17   Blue, Olivia C, PA-C  fluocinonide cream (LIDEX) 0.05 % Apply 1 application topically 2 (two) times daily. Patient not taking: Reported on 09/21/2018 08/30/18   Mardella Layman, MD    Family History History reviewed. No pertinent family history.  Social History Social History    Tobacco Use  . Smoking status: Former Smoker    Packs/day: 1.00    Years: 20.00    Pack years: 20.00    Types: Cigarettes  . Smokeless tobacco: Never Used  Substance Use Topics  . Alcohol use: Not Currently    Comment: social  . Drug use: Not Currently    Types: Marijuana     Allergies   Aspirin   Review of Systems Review of Systems  Reason unable to perform ROS: See HPI as above.     Physical Exam Triage Vital Signs ED Triage Vitals  Enc Vitals Group     BP 10/21/18 1044 (!) 110/53     Pulse Rate 10/21/18 1044 60     Resp 10/21/18 1044 16     Temp 10/21/18 1044 98 F (36.7 C)     Temp Source 10/21/18 1044 Oral     SpO2 10/21/18 1044 99 %     Weight --      Height --      Head Circumference --      Peak Flow --      Pain Score 10/21/18 1042 0     Pain Loc --      Pain Edu? --      Excl. in GC? --    No data found.  Updated Vital Signs BP (!) 110/53   Pulse 60 Comment: irreg  Temp 98 F (36.7 C) (Oral)   Resp 16   SpO2 99%   Physical Exam Constitutional:      General: He is not in acute distress.    Appearance: He is well-developed. He is not ill-appearing, toxic-appearing or diaphoretic.  HENT:     Head: Normocephalic and atraumatic.  Eyes:     Conjunctiva/sclera: Conjunctivae normal.     Pupils: Pupils are equal, round, and reactive to light.  Neck:     Musculoskeletal: Normal range of motion and neck supple.  Cardiovascular:     Rate and Rhythm: Normal rate. Rhythm irregularly irregular.     Pulses:          Radial pulses are 2+ on the right side and 2+ on the left side.     Heart sounds: No murmur. No friction rub. No gallop.   Pulmonary:     Effort: Pulmonary effort is normal. No accessory muscle usage, prolonged expiration or respiratory distress.     Breath sounds: Normal breath sounds and air entry. No stridor, decreased air movement or transmitted upper airway sounds. No decreased breath sounds, wheezing, rhonchi or rales.   Musculoskeletal:     Right lower leg: No edema.     Left lower leg: No edema.  Neurological:     Mental Status: He is alert and oriented to person, place, and time.    UC  Treatments / Results  Labs (all labs ordered are listed, but only abnormal results are displayed) Labs Reviewed  PROTIME-INR - Abnormal; Notable for the following components:      Result Value   Prothrombin Time 33.8 (*)    INR 3.4 (*)    All other components within normal limits    EKG None  Radiology No results found.  Procedures Procedures (including critical care time)  Medications Ordered in UC Medications - No data to display  Initial Impression / Assessment and Plan / UC Course  I have reviewed the triage vital signs and the nursing notes.  Pertinent labs & imaging results that were available during my care of the patient were reviewed by me and considered in my medical decision making (see chart for details).    No alarming signs on exam. EKG afib, 74bpm, ST changes that is unchanged from prior. EKG reviewed by Dr Delton See as well. Exam reassuring. LCTAB. Patient stable and currently asymptomatic. Will draw INR for further evaluation and have patient call cardiology for further instructions tomorrow. Return precautions given.  INR supratherapeutic at 3.4. Called patient, confirmed identity with name and birth date, and informed of lab results. Patient to hold warfarin dose tonight, and contact cardiologist tomorrow morning for further evaluation and medication management. Reminded for return precautions. Patient expresses understanding and agrees to plan.  Case discussed with Dr Delton See, who agrees to plan.  Final Clinical Impressions(s) / UC Diagnoses   Final diagnoses:  Atrial fibrillation, unspecified type [facility])    ED Prescriptions    None        Belinda Fisher, PA-C 10/21/18 1220

## 2018-10-21 NOTE — ED Triage Notes (Signed)
C/O sensation of rapid HR intermittently over past week with intermittent chest pain "twinges".  Denies any sensation or pains at present.  ONly c/o fatigue "because I haven't been sleeping".  HR irreg upon palpation - states normally in A-fib.

## 2018-10-22 ENCOUNTER — Other Ambulatory Visit: Payer: Self-pay

## 2018-10-22 ENCOUNTER — Emergency Department (HOSPITAL_COMMUNITY)
Admission: EM | Admit: 2018-10-22 | Discharge: 2018-10-22 | Disposition: A | Discharge status: Home / Self Care | Payer: Medicaid Other | Attending: Emergency Medicine | Admitting: Emergency Medicine

## 2018-10-22 ENCOUNTER — Encounter (HOSPITAL_COMMUNITY): Payer: Self-pay | Admitting: Emergency Medicine

## 2018-10-22 ENCOUNTER — Emergency Department (HOSPITAL_COMMUNITY): Payer: Medicaid Other

## 2018-10-22 DIAGNOSIS — R778 Other specified abnormalities of plasma proteins: Secondary | ICD-10-CM

## 2018-10-22 DIAGNOSIS — I509 Heart failure, unspecified: Secondary | ICD-10-CM | POA: Diagnosis not present

## 2018-10-22 DIAGNOSIS — Z7901 Long term (current) use of anticoagulants: Secondary | ICD-10-CM | POA: Diagnosis not present

## 2018-10-22 DIAGNOSIS — Z87891 Personal history of nicotine dependence: Secondary | ICD-10-CM | POA: Diagnosis not present

## 2018-10-22 DIAGNOSIS — R0602 Shortness of breath: Secondary | ICD-10-CM | POA: Diagnosis present

## 2018-10-22 DIAGNOSIS — J181 Lobar pneumonia, unspecified organism: Secondary | ICD-10-CM | POA: Insufficient documentation

## 2018-10-22 DIAGNOSIS — J189 Pneumonia, unspecified organism: Secondary | ICD-10-CM

## 2018-10-22 DIAGNOSIS — R7989 Other specified abnormal findings of blood chemistry: Secondary | ICD-10-CM | POA: Diagnosis not present

## 2018-10-22 DIAGNOSIS — Z79899 Other long term (current) drug therapy: Secondary | ICD-10-CM | POA: Diagnosis not present

## 2018-10-22 LAB — CBC WITH DIFFERENTIAL/PLATELET
Abs Immature Granulocytes: 0.02 10*3/uL (ref 0.00–0.07)
Basophils Absolute: 0 10*3/uL (ref 0.0–0.1)
Basophils Relative: 1 %
Eosinophils Absolute: 0.5 10*3/uL (ref 0.0–0.5)
Eosinophils Relative: 6 %
HCT: 39.3 % (ref 39.0–52.0)
Hemoglobin: 13.7 g/dL (ref 13.0–17.0)
Immature Granulocytes: 0 %
Lymphocytes Relative: 14 %
Lymphs Abs: 1.2 10*3/uL (ref 0.7–4.0)
MCH: 31.6 pg (ref 26.0–34.0)
MCHC: 34.9 g/dL (ref 30.0–36.0)
MCV: 90.6 fL (ref 80.0–100.0)
Monocytes Absolute: 1 10*3/uL (ref 0.1–1.0)
Monocytes Relative: 11 %
Neutro Abs: 5.7 10*3/uL (ref 1.7–7.7)
Neutrophils Relative %: 68 %
Platelets: 130 10*3/uL — ABNORMAL LOW (ref 150–400)
RBC: 4.34 MIL/uL (ref 4.22–5.81)
RDW: 11.7 % (ref 11.5–15.5)
WBC: 8.4 10*3/uL (ref 4.0–10.5)
nRBC: 0 % (ref 0.0–0.2)

## 2018-10-22 LAB — COMPREHENSIVE METABOLIC PANEL
ALT: 26 U/L (ref 0–44)
AST: 35 U/L (ref 15–41)
Albumin: 3.9 g/dL (ref 3.5–5.0)
Alkaline Phosphatase: 114 U/L (ref 38–126)
Anion gap: 12 (ref 5–15)
BUN: 15 mg/dL (ref 6–20)
CO2: 22 mmol/L (ref 22–32)
Calcium: 9.1 mg/dL (ref 8.9–10.3)
Chloride: 102 mmol/L (ref 98–111)
Creatinine, Ser: 1.04 mg/dL (ref 0.61–1.24)
GFR calc Af Amer: >60 mL/min (ref >60)
GFR calc non Af Amer: >60 mL/min (ref >60)
Glucose, Bld: 90 mg/dL (ref 70–99)
Potassium: 4.4 mmol/L (ref 3.5–5.1)
Sodium: 136 mmol/L (ref 135–145)
Total Bilirubin: 2 mg/dL — ABNORMAL HIGH (ref 0.3–1.2)
Total Protein: 7.2 g/dL (ref 6.5–8.1)

## 2018-10-22 LAB — TROPONIN I
Troponin I: 0.08 ng/mL (ref <0.03)
Troponin I: 0.09 ng/mL (ref <0.03)

## 2018-10-22 LAB — PHOSPHORUS: Phosphorus: 3.9 mg/dL (ref 2.5–4.6)

## 2018-10-22 LAB — MAGNESIUM: Magnesium: 1.9 mg/dL (ref 1.7–2.4)

## 2018-10-22 LAB — PROTIME-INR
INR: 2.7 — ABNORMAL HIGH (ref 0.8–1.2)
Prothrombin Time: 28.1 seconds — ABNORMAL HIGH (ref 11.4–15.2)

## 2018-10-22 MED ORDER — AMOXICILLIN-POT CLAVULANATE 875-125 MG PO TABS: 1.0000 | ORAL_TABLET | Freq: Two times a day (BID) | ORAL | 0 refills | Status: AC

## 2018-10-22 MED ORDER — SODIUM CHLORIDE 0.9 % IV SOLN
1.0000 g | Freq: Once | INTRAVENOUS | Status: AC
  Administered 2018-10-22: 19:00:00 1 g via INTRAVENOUS
  Filled 2018-10-22: qty 10

## 2018-10-22 MED ORDER — SODIUM CHLORIDE 0.9 % IV SOLN
500.0000 mg | Freq: Once | INTRAVENOUS | Status: AC
  Administered 2018-10-22: 500 mg via INTRAVENOUS
  Filled 2018-10-22: qty 500

## 2018-10-22 MED ORDER — AZITHROMYCIN 250 MG PO TABS: 250.0000 mg | ORAL_TABLET | Freq: Every day | ORAL | 0 refills | Status: DC

## 2018-10-22 NOTE — ED Notes (Signed)
Patient verbalizes understanding of discharge instructions. Opportunity for questioning and answers were provided. Armband removed by staff, pt discharged from ED ambulatory to home.  

## 2018-10-22 NOTE — ED Provider Notes (Signed)
[facility] EMERGENCY DEPARTMENT Provider Note   CSN: [id number] Arrival date & time: 10/22/18  1630    History   Chief Complaint No chief complaint on file.   HPI Gary [name] is a 43 y.o. male.    HPI Pt with several days of increasing weakness and occasional palpitations presents to the emergency department.  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  Has noticed occasional bouts of tachycardia over the last several days worse than previous.  Was seen at an urgent care yesterday and was told his INR was supratherapeutic slightly and was told to follow-up today for this.  Called his cardiologist who suggested that he be seen.  Denies fevers, changes in bowel or bladder habits and appetite has been normal.  States that he has occasional left-sided sharp chest pain but does not currently have any chest pain.  Last chest pain was approximately 2 days ago.  No coughing at home.  Has been taking all of his medications.  Quit smoking 2 weeks ago. Past Medical History:  Diagnosis Date  . Atrial fibrillation (HCC)   . CHF (congestive heart failure) (HCC)   . Dysrhythmia    hx. of  A-fib  . Internal bleeding    DUE TO ASPIRIN, OR MEDS  . Murmur   . Smoker     Patient Active Problem List   Diagnosis Date Noted  . Protein-calorie malnutrition, severe 10/17/2017  . Incarcerated inguinal hernia 10/13/2017  . Acute congestive heart failure (HCC) 08/14/2017    Past Surgical History:  Procedure Laterality Date  . ABDOMINAL SURGERY    . ABDOMINAL SURGERY     under 10 yrs. old or younger  . CARDIAC SURGERY    . CARDIOVERSION N/A 08/17/2017   Procedure: CARDIOVERSION;  Surgeon: Orpah Cobb, MD;  Location: [facility] ENDOSCOPY;  Service: Cardiovascular;  Laterality: N/A;  . HERNIA REPAIR    . INGUINAL HERNIA REPAIR Right 10/13/2017   Procedure: OPEN RIGHT INGUINAL HERNIA REPAIR ERAS PATHWAY;   Surgeon: Jimmye Norman, MD;  Location: [facility] OR;  Service: General;  Laterality: Right;  . OPEN HEART SURGERY     AS A CHILD , pt. thinks he was under 10 yrs. old. done in [place]  . TEE WITHOUT CARDIOVERSION N/A 08/17/2017   Procedure: TRANSESOPHAGEAL ECHOCARDIOGRAM (TEE);  Surgeon: Orpah Cobb, MD;  Location: [facility] ENDOSCOPY;  Service: Cardiovascular;  Laterality: N/A;        Home Medications    Prior to Admission medications   Medication Sig Start Date End Date Taking? Authorizing Provider  amiodarone (PACERONE) 200 MG tablet Take 1 tablet (200 mg total) by mouth daily. Patient taking differently: Take 100 mg by mouth daily.  08/22/17   Rinaldo Cloud, MD  docusate sodium (COLACE) 100 MG capsule Take 1 capsule (100 mg total) by mouth 2 (two) times daily as needed for mild constipation. Patient not taking: Reported on 09/21/2018 10/26/17   Blue, Olivia C, PA-C  fluocinonide cream (LIDEX) 0.05 % Apply 1 application topically 2 (two) times daily. Patient not taking: Reported on 09/21/2018 08/30/18   Mardella Layman, MD  furosemide (LASIX) 20 MG tablet Take 1 tablet (20 mg total) by mouth daily. 09/21/18   Dartha Lodge, PA-C  losartan (COZAAR) 25 MG tablet Take 1 tablet (25 mg total) by mouth daily. 08/22/17   Rinaldo Cloud, MD  warfarin (COUMADIN) 5 MG tablet Take 1 tablet (5 mg total)  by mouth one time only at 6 PM. Patient taking differently: Take 2.5-5 mg by mouth daily at 6 PM. Take 5 mg by mouth once daily at 6pm except Sunday take 2.5 mg  M W F - 7.5MG -- All other days is 5MG  08/21/17   Rinaldo Cloud, MD    Family History History reviewed. No pertinent family history.  Social History Social History   Tobacco Use  . Smoking status: Former Smoker    Packs/day: 1.00    Years: 20.00    Pack years: 20.00    Types: Cigarettes  . Smokeless tobacco: Never Used  Substance Use Topics  . Alcohol use: Not Currently    Comment: social  . Drug use: Not Currently    Types: Marijuana      Allergies   Aspirin   Review of Systems Review of Systems  Constitutional: Positive for activity change and fatigue. Negative for appetite change, chills and fever.  HENT: Negative for ear pain and sore throat.   Eyes: Negative for pain and visual disturbance.  Respiratory: Positive for shortness of breath. Negative for cough, chest tightness and wheezing.   Cardiovascular: Positive for chest pain. Negative for palpitations.  Gastrointestinal: Negative for abdominal pain and vomiting.  Genitourinary: Negative for dysuria and hematuria.  Musculoskeletal: Negative for arthralgias and back pain.  Skin: Negative for color change and rash.  Neurological: Negative for seizures and syncope.  All other systems reviewed and are negative.    Physical Exam Updated Vital Signs BP 130/87 (BP Location: Right Arm)   Pulse 93   Temp 98 F (36.7 C) (Oral)   Resp (!) 24   Ht 5\' 5"  (1.651 m)   Wt 52.2 kg   SpO2 96%   BMI 19.14 kg/m   Physical Exam Vitals signs and nursing note reviewed.  Constitutional:      Appearance: He is well-developed. He is not ill-appearing or toxic-appearing.  HENT:     Head: Normocephalic and atraumatic.     Nose: No congestion or rhinorrhea.     Mouth/Throat:     Pharynx: No oropharyngeal exudate or posterior oropharyngeal erythema.  Eyes:     Conjunctiva/sclera: Conjunctivae normal.  Neck:     Musculoskeletal: Neck supple.  Cardiovascular:     Rate and Rhythm: Normal rate. Rhythm irregular.     Heart sounds: No murmur.  Pulmonary:     Effort: Pulmonary effort is normal. No respiratory distress.     Breath sounds: Normal breath sounds.     Comments: L>R Lung sounds but otherwise normal  Abdominal:     Palpations: Abdomen is soft.     Tenderness: There is no abdominal tenderness.  Musculoskeletal:        General: No deformity or signs of injury.     Right lower leg: No edema.     Left lower leg: No edema.  Skin:    General: Skin is warm and dry.   Neurological:     General: No focal deficit present.     Mental Status: He is alert and oriented to person, place, and time.  Psychiatric:        Mood and Affect: Mood normal.      ED Treatments / Results  Labs (all labs ordered are listed, but only abnormal results are displayed) Labs Reviewed  MAGNESIUM  COMPREHENSIVE METABOLIC PANEL  CBC WITH DIFFERENTIAL/PLATELET  PHOSPHORUS  PROTIME-INR  TROPONIN I  TROPONIN I    EKG EKG Interpretation  Date/Time:  Monday October 22 2018 16:38:07 EDT Ventricular Rate:  91 PR Interval:    QRS Duration: 108 QT Interval:  410 QTC Calculation: 461 R Axis:   25 Text Interpretation:  Atrial fibrillation LVH with secondary repolarization abnormality Anterior ST elevation, probably due to LVH No significant change since last tracing Confirmed by Melene Plan [phone]) on 10/22/2018 4:41:56 PM   Radiology No results found.  Procedures Procedures (including critical care time)  Medications Ordered in ED Medications - No data to display   Initial Impression / Assessment and Plan / ED Course  I have reviewed the triage vital signs and the nursing notes.  Pertinent labs & imaging results that were available during my care of the patient were reviewed by me and considered in my medical decision making (see chart for details).     HEAR Score: 3   Well-appearing 43 year old man presents to the emergency department for shortness of breath as described above.  Chest pain appears to be atypical by story and is heart score is 3.  Initial troponin elevated at 0.08 which is double from his previous value.  Palpitations are likely related to RVR from his known atrial fibrillation.  Found to have right lower lobe infiltrates concerning for pneumonia.  This would be community-acquired in origin so ceftriaxone azithromycin was given to the patient.  Does not appear to be septic at this time.  I spoke to the patient's primary cardiologist over the phone,  Dr. Sharyn Lull, who stated that because he had already spoken with the patient earlier today via telephone as well as his lack of concerning symptoms such as chest pain that given the low troponin level if a repeat was similar or lower the patient could follow-up with him at his regularly scheduled office visit tomorrow morning.  Repeat troponin elevated from 0.08 to 0.09.  I have discussed this with the patient and the cardiologist and they are both agreeable to following up in the morning for this.  Patient will be placed on community-acquired antibiotics for pneumonia with risk factors of heart disease so he will receive Augmentin and azithromycin.  Prescription sent to pharmacy.  Patient told to return to the emergency department if any worrisome signs or symptoms appear.  Other return precautions given and paperwork. Final Clinical Impressions(s) / ED Diagnoses   Final diagnoses:  Community acquired pneumonia of right lower lobe of lung (HCC)  Elevated troponin    ED Discharge Orders    None       Ina Kick, MD 10/22/18 2136    Melene Plan, DO 10/22/18 2146

## 2018-10-22 NOTE — ED Triage Notes (Signed)
Pt here from home with c/o fatigue and palpitations x2 days. Pt went to UC yesterday, staff reported to PT to follow up with INR check. Also states he has an appointment with his cardiologist tomorrow at 1600.  Pt denies any current CP and SOB.

## 2018-10-23 ENCOUNTER — Telehealth: Payer: Self-pay | Admitting: *Deleted

## 2018-10-23 NOTE — Telephone Encounter (Signed)
Pharmacy called regarding Dx for Zithromax Rx.  EDCM reviewed chart to find pt Dx is pneumonia.

## 2018-10-25 ENCOUNTER — Emergency Department (HOSPITAL_COMMUNITY)
Admission: EM | Admit: 2018-10-25 | Discharge: 2018-10-25 | Disposition: A | Discharge status: Home / Self Care | Payer: Medicaid Other | Attending: Emergency Medicine | Admitting: Emergency Medicine

## 2018-10-25 ENCOUNTER — Emergency Department (HOSPITAL_COMMUNITY): Payer: Medicaid Other

## 2018-10-25 ENCOUNTER — Other Ambulatory Visit: Payer: Self-pay

## 2018-10-25 ENCOUNTER — Encounter (HOSPITAL_COMMUNITY): Payer: Self-pay

## 2018-10-25 DIAGNOSIS — Z7901 Long term (current) use of anticoagulants: Secondary | ICD-10-CM | POA: Insufficient documentation

## 2018-10-25 DIAGNOSIS — Z87891 Personal history of nicotine dependence: Secondary | ICD-10-CM | POA: Diagnosis not present

## 2018-10-25 DIAGNOSIS — I4891 Unspecified atrial fibrillation: Secondary | ICD-10-CM | POA: Insufficient documentation

## 2018-10-25 DIAGNOSIS — Z79899 Other long term (current) drug therapy: Secondary | ICD-10-CM | POA: Diagnosis not present

## 2018-10-25 DIAGNOSIS — I509 Heart failure, unspecified: Secondary | ICD-10-CM | POA: Diagnosis not present

## 2018-10-25 DIAGNOSIS — R0789 Other chest pain: Secondary | ICD-10-CM | POA: Diagnosis present

## 2018-10-25 LAB — BASIC METABOLIC PANEL
Anion gap: 12 (ref 5–15)
BUN: 18 mg/dL (ref 6–20)
CO2: 19 mmol/L — ABNORMAL LOW (ref 22–32)
Calcium: 8.9 mg/dL (ref 8.9–10.3)
Chloride: 104 mmol/L (ref 98–111)
Creatinine, Ser: 1.07 mg/dL (ref 0.61–1.24)
GFR calc Af Amer: >60 mL/min (ref >60)
GFR calc non Af Amer: >60 mL/min (ref >60)
Glucose, Bld: 147 mg/dL — ABNORMAL HIGH (ref 70–99)
Potassium: 4.3 mmol/L (ref 3.5–5.1)
Sodium: 135 mmol/L (ref 135–145)

## 2018-10-25 LAB — CBC
HCT: 41 % (ref 39.0–52.0)
Hemoglobin: 13.5 g/dL (ref 13.0–17.0)
MCH: 30.2 pg (ref 26.0–34.0)
MCHC: 32.9 g/dL (ref 30.0–36.0)
MCV: 91.7 fL (ref 80.0–100.0)
Platelets: 134 10*3/uL — ABNORMAL LOW (ref 150–400)
RBC: 4.47 MIL/uL (ref 4.22–5.81)
RDW: 11.7 % (ref 11.5–15.5)
WBC: 6.7 10*3/uL (ref 4.0–10.5)
nRBC: 0 % (ref 0.0–0.2)

## 2018-10-25 LAB — BRAIN NATRIURETIC PEPTIDE: B Natriuretic Peptide: 1055.3 pg/mL — ABNORMAL HIGH (ref 0.0–100.0)

## 2018-10-25 LAB — TROPONIN I
Troponin I: 0.05 ng/mL (ref <0.03)
Troponin I: 0.06 ng/mL (ref <0.03)

## 2018-10-25 MED ORDER — IOHEXOL 350 MG/ML SOLN
75.0000 mL | Freq: Once | INTRAVENOUS | Status: AC | PRN
  Administered 2018-10-25: 75 mL via INTRAVENOUS

## 2018-10-25 NOTE — ED Provider Notes (Signed)
[facility] EMERGENCY DEPARTMENT Provider Note   CSN: [id number] Arrival date & time: 10/25/18  0923   History   Chief Complaint Chief Complaint  Patient presents with   Chest Pain    HPI Gary [name] is a 43 y.o. male who presents with chest pain. PMH significant for CHF EF 45-50%, A.fib on Coumadin, tobacco use (quit 3 weeks ago). He was seen at [facility] on 4/12 and in the ED on 4/13 for similar complaints. He was noted to have a positive troponin at that time but it was a flat trend (.08 and .09 respectively) and pain was atypical sounding. He states that since his ED visit he has followed up with Dr. Sharyn Lull who told him he was doing okay. His Amiodarone was adjusted and reports decreased palpitations and his SOB is overall improving. He is concerned however because he continues to have intermittent sharp left sided chest pain that will radiate to his left arm. It occurs at random and is not associated with exertion. He initially thought the CP was due to smoking so he stopped smoking three weeks ago but still continues to have the chest pain and it's disturbing his sleep. He denies fever or coughing. No leg swelling. He is taking his medications and antibiotics.  He also thinks it there may be some anxiety that is causing his symptoms because a friend recently died of a stroke. He states he just want to get checked because he doesn't want "to catch a heart attack or stroke".     HPI  Past Medical History:  Diagnosis Date   Atrial fibrillation (HCC)    CHF (congestive heart failure) (HCC)    Dysrhythmia    hx. of  A-fib   Internal bleeding    DUE TO ASPIRIN, OR MEDS   Murmur    Smoker     Patient Active Problem List   Diagnosis Date Noted   Protein-calorie malnutrition, severe 10/17/2017   Incarcerated inguinal hernia 10/13/2017   Acute congestive heart failure (HCC) 08/14/2017    Past Surgical History:  Procedure Laterality Date   ABDOMINAL  SURGERY     ABDOMINAL SURGERY     under 10 yrs. old or younger   CARDIAC SURGERY     CARDIOVERSION N/A 08/17/2017   Procedure: CARDIOVERSION;  Surgeon: Orpah Cobb, MD;  Location: [facility] ENDOSCOPY;  Service: Cardiovascular;  Laterality: N/A;   HERNIA REPAIR     INGUINAL HERNIA REPAIR Right 10/13/2017   Procedure: OPEN RIGHT INGUINAL HERNIA REPAIR ERAS PATHWAY;  Surgeon: Jimmye Norman, MD;  Location: [facility] OR;  Service: General;  Laterality: Right;   OPEN HEART SURGERY     AS A CHILD , pt. thinks he was under 10 yrs. old. done in [facility]   TEE WITHOUT CARDIOVERSION N/A 08/17/2017   Procedure: TRANSESOPHAGEAL ECHOCARDIOGRAM (TEE);  Surgeon: Orpah Cobb, MD;  Location: [facility] ENDOSCOPY;  Service: Cardiovascular;  Laterality: N/A;        Home Medications    Prior to Admission medications   Medication Sig Start Date End Date Taking? Authorizing Provider  amiodarone (PACERONE) 200 MG tablet Take 1 tablet (200 mg total) by mouth daily. Patient taking differently: Take 100 mg by mouth daily.  08/22/17   Rinaldo Cloud, MD  amoxicillin-clavulanate (AUGMENTIN) 875-125 MG tablet Take 1 tablet by mouth every 12 (twelve) hours for 7 days. 10/22/18 10/29/18  Ina Kick, MD  azithromycin (ZITHROMAX) 250 MG tablet Take 1 tablet (250 mg total) by mouth daily for  4 days. Take first 2 tablets together, then 1 every day until finished. 10/23/18 10/27/18  Ina Kick, MD  docusate sodium (COLACE) 100 MG capsule Take 1 capsule (100 mg total) by mouth 2 (two) times daily as needed for mild constipation. Patient not taking: Reported on 09/21/2018 10/26/17   Blue, Olivia C, PA-C  fluocinonide cream (LIDEX) 0.05 % Apply 1 application topically 2 (two) times daily. Patient not taking: Reported on 09/21/2018 08/30/18   Mardella Layman, MD  furosemide (LASIX) 20 MG tablet Take 1 tablet (20 mg total) by mouth daily. 09/21/18   Dartha Lodge, PA-C  losartan (COZAAR) 25 MG tablet Take 1 tablet (25 mg total) by mouth  daily. 08/22/17   Rinaldo Cloud, MD  warfarin (COUMADIN) 5 MG tablet Take 1 tablet (5 mg total) by mouth one time only at 6 PM. Patient taking differently: Take 2.5-5 mg by mouth See admin instructions. Take 7.5mg  on MON, WED, FRI and 5mg  on all other days. 08/21/17   Rinaldo Cloud, MD    Family History History reviewed. No pertinent family history.  Social History Social History   Tobacco Use   Smoking status: Former Smoker    Packs/day: 1.00    Years: 20.00    Pack years: 20.00    Types: Cigarettes   Smokeless tobacco: Never Used  Substance Use Topics   Alcohol use: Not Currently    Comment: social   Drug use: Not Currently    Types: Marijuana     Allergies   Aspirin   Review of Systems Review of Systems  Constitutional: Negative for fever.  Respiratory: Negative for cough, shortness of breath and wheezing.   Cardiovascular: Positive for chest pain. Negative for palpitations and leg swelling.  Gastrointestinal: Negative for abdominal pain.  Neurological: Negative for syncope.  All other systems reviewed and are negative.    Physical Exam Updated Vital Signs Temp (!) 97.5 F (36.4 C) (Oral)    Ht 5\' 5"  (1.651 m)    Wt 54.4 kg    BMI 19.97 kg/m   Physical Exam Vitals signs and nursing note reviewed.  Constitutional:      General: He is not in acute distress.    Appearance: Normal appearance. He is well-developed. He is not ill-appearing.     Comments: Calm and cooperative  HENT:     Head: Normocephalic and atraumatic.  Eyes:     General: No scleral icterus.       Right eye: No discharge.        Left eye: No discharge.     Conjunctiva/sclera: Conjunctivae normal.     Pupils: Pupils are equal, round, and reactive to light.  Neck:     Musculoskeletal: Normal range of motion.  Cardiovascular:     Rate and Rhythm: Normal rate. Rhythm irregular.     Heart sounds: Murmur present.  Pulmonary:     Effort: Pulmonary effort is normal. No respiratory  distress.     Breath sounds: Normal breath sounds.     Comments: Large well healed surgical scar over the chest wall Abdominal:     General: There is no distension.     Palpations: Abdomen is soft.     Tenderness: There is no abdominal tenderness.     Comments: Large well healed transverse surgical scar  Musculoskeletal:     Right lower leg: No edema.     Left lower leg: No edema.  Skin:    General: Skin is warm and dry.  Neurological:  Mental Status: He is alert and oriented to person, place, and time.  Psychiatric:        Behavior: Behavior normal.      ED Treatments / Results  Labs (all labs ordered are listed, but only abnormal results are displayed) Labs Reviewed  BASIC METABOLIC PANEL - Abnormal; Notable for the following components:      Result Value   CO2 19 (*)    Glucose, Bld 147 (*)    All other components within normal limits  CBC - Abnormal; Notable for the following components:   Platelets 134 (*)    All other components within normal limits  TROPONIN I - Abnormal; Notable for the following components:   Troponin I 0.05 (*)    All other components within normal limits  BRAIN NATRIURETIC PEPTIDE - Abnormal; Notable for the following components:   B Natriuretic Peptide 1,055.3 (*)    All other components within normal limits  TROPONIN I - Abnormal; Notable for the following components:   Troponin I 0.06 (*)    All other components within normal limits    EKG EKG Interpretation  Date/Time:  Thursday October 25 2018 10:00:14 EDT Ventricular Rate:  62 PR Interval:    QRS Duration: 116 QT Interval:  472 QTC Calculation: 480 R Axis:   56 Text Interpretation:  Atrial fibrillation Left ventricular hypertrophy Abnormal T, probable ischemia, lateral leads Anterior ST elevation, probably due to LVH No significant change since April 13 Confirmed by Vanetta Mulders [phone]) on 10/25/2018 10:03:49 AM Also confirmed by Vanetta Mulders [phone]), editor Elita Quick [phone])  on 10/25/2018 12:37:21 PM   Radiology Dg Chest 2 View  Result Date: 10/25/2018 CLINICAL DATA:  Chest pain EXAM: CHEST - 2 VIEW COMPARISON:  October 22, 2018 FINDINGS: There is mild atelectatic change in the left mid lung. There has been significant clearing of infiltrate from the right base. A small amount of residual opacity remains in this area. There is no edema or consolidation. There is cardiomegaly with pulmonary venous hypertension. No adenopathy. No bone lesions. IMPRESSION: Pulmonary vascular congestion. Significant clearing of infiltrate from the right base with small amount of residual opacity remaining in this area. Mild atelectasis left mid lung. Lungs elsewhere clear. No adenopathy demonstrable. Electronically Signed   By: Bretta Bang III M.D.   On: 10/25/2018 09:43   Ct Angio Chest Pe W/cm &/or Wo Cm  Result Date: 10/25/2018 CLINICAL DATA:  Chest pain EXAM: CT ANGIOGRAPHY CHEST WITH CONTRAST TECHNIQUE: Multidetector CT imaging of the chest was performed using the standard protocol during bolus administration of intravenous contrast. Multiplanar CT image reconstructions and MIPs were obtained to evaluate the vascular anatomy. CONTRAST:  75mL OMNIPAQUE IOHEXOL 350 MG/ML SOLN COMPARISON:  CT angiogram chest August 10, 2009; chest radiographs October 22, 2018 and October 25, 2018 FINDINGS: Cardiovascular: There is no demonstrable pulmonary embolus. There is no thoracic aortic aneurysm. No dissection is seen in the aorta; the contrast bolus within the aorta is not sufficient to assess for potential dissection from a radiographic standpoint. The visualized great vessels appear unremarkable. Note that the supreme intercostal vein on the left and left hemiazygous system remain dilated, stable from 2011. Left-sided aortic arch present. There is cardiomegaly with primarily left-sided enlargement. There is no pericardial effusion or pericardial thickening evident. Main pulmonary  outflow tract measures 4.0 cm, enlarged. Mediastinum/Nodes: No appreciable thyroid lesion. There is no appreciable thoracic adenopathy. No esophageal lesions are evident. Lungs/Pleura: There is a calcified granuloma in the  anterior segment of the right lower lobe. There Is patchy infiltrate in the posterior right base. There is also bibasilar and left mid lung atelectasis. There is no appreciable interstitial edema. No appreciable pleural effusion or pleural thickening. Upper Abdomen: There is reflux of contrast into the inferior vena cava and hepatic veins. There appears to be splenic tissue on the right, stable from previous study. Liver is on the right side. Left kidney is in normal flank position. Right kidney not seen in visualized upper abdomen. Musculoskeletal: There is thoracic dextroscoliosis. There are no blastic or lytic bone lesions. No evident chest wall lesions. Review of the MIP images confirms the above findings. IMPRESSION: 1. No demonstrable pulmonary embolus. No thoracic aortic aneurysm. Contrast bolus in the aorta is not sufficient to assess for dissection. No dissection evident currently. 2. Enlargement of the main pulmonary outflow tract, consistent with pulmonary arterial hypertension. 3. Cardiomegaly with predominantly left heart enlargement. Dilated hemiazygous system and supreme intercostal vein on the left. Appearance stable compared to prior study. 4. Patchy infiltrate posterior right base, apparently less than on recent chest radiograph. Small granuloma right base. Areas of patchy atelectasis. No new consolidation compared to recent chest radiograph. 5. Reflux of contrast into the inferior vena cava and hepatic veins may well represent a degree of increase in right heart pressure. 6.  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 7.   No demonstrable adenopathy. Electronically Signed   By: Bretta Bang III M.D.   On: 10/25/2018 13:35    Procedures Procedures (including  critical care time)  Medications Ordered in ED Medications  iohexol (OMNIPAQUE) 350 MG/ML injection 75 mL (75 mLs Intravenous Contrast Given 10/25/18 1313)     Initial Impression / Assessment and Plan / ED Course  I have reviewed the triage vital signs and the nursing notes.  Pertinent labs & imaging results that were available during my care of the patient were reviewed by me and considered in my medical decision making (see chart for details).  43 year old male presents with ongoing intermittent chest pain. He has been seen twice for the same and has seen his cardiologist in the past week. His vitals are normal. He is in rate controlled A.fib. Chest pain work up is reassuring. Doubt ACS, PE, pericarditis, esophageal rupture, tension pneumothorax, aortic dissection, cardiac tamponade. EKG is rate controlled A.fib and shows no significant change since last. CXR shows improving infiltrate. Initial troponin is .05 which is lower than last week. 2nd is .06. Labs are remarkable for mild hyperglycemia. His BNP is slightly elevated from baseline - it is 1,055 today but he feels like his SOB is better and does not look volume overloaded on exam. CT chest was obtained to r/o PE which was negative. Discussed with patient. Provided reassurance and encouraged f/u with Dr. Sharyn Lull.  Final Clinical Impressions(s) / ED Diagnoses   Final diagnoses:  Atypical chest pain    ED Discharge Orders    None       Bethel Born, PA-C 10/25/18 1441    Vanetta Mulders, MD 10/27/18 [phone]

## 2018-10-25 NOTE — ED Triage Notes (Signed)
Pt endorses left sided chest pain with radiation to the arm. Recently d/c here for PNA and has taken all of antibiotics. SHOB is improved. Denies fever/chills.

## 2018-10-25 NOTE — Discharge Instructions (Signed)
Please continue your medications and finish your antibiotics Follow up with Dr. Sharyn Lull Please return if worsening

## 2018-10-26 DIAGNOSIS — Z7901 Long term (current) use of anticoagulants: Secondary | ICD-10-CM | POA: Diagnosis not present

## 2018-10-26 DIAGNOSIS — I420 Dilated cardiomyopathy: Secondary | ICD-10-CM | POA: Diagnosis not present

## 2018-10-26 DIAGNOSIS — Z886 Allergy status to analgesic agent status: Secondary | ICD-10-CM | POA: Diagnosis not present

## 2018-10-26 DIAGNOSIS — I110 Hypertensive heart disease with heart failure: Secondary | ICD-10-CM | POA: Diagnosis not present

## 2018-10-26 DIAGNOSIS — I482 Chronic atrial fibrillation, unspecified: Secondary | ICD-10-CM | POA: Diagnosis not present

## 2018-10-26 DIAGNOSIS — R001 Bradycardia, unspecified: Secondary | ICD-10-CM | POA: Diagnosis present

## 2018-10-26 DIAGNOSIS — T45515A Adverse effect of anticoagulants, initial encounter: Secondary | ICD-10-CM | POA: Diagnosis not present

## 2018-10-26 DIAGNOSIS — R0789 Other chest pain: Secondary | ICD-10-CM | POA: Diagnosis present

## 2018-10-26 DIAGNOSIS — I509 Heart failure, unspecified: Secondary | ICD-10-CM | POA: Diagnosis not present

## 2018-10-26 DIAGNOSIS — Z79899 Other long term (current) drug therapy: Secondary | ICD-10-CM | POA: Diagnosis not present

## 2018-10-26 DIAGNOSIS — R791 Abnormal coagulation profile: Secondary | ICD-10-CM | POA: Diagnosis not present

## 2018-10-26 DIAGNOSIS — Z7289 Other problems related to lifestyle: Secondary | ICD-10-CM | POA: Insufficient documentation

## 2018-10-26 DIAGNOSIS — I4430 Unspecified atrioventricular block: Secondary | ICD-10-CM | POA: Diagnosis not present

## 2018-10-26 DIAGNOSIS — Z87891 Personal history of nicotine dependence: Secondary | ICD-10-CM | POA: Diagnosis not present

## 2018-10-26 DIAGNOSIS — T363X5A Adverse effect of macrolides, initial encounter: Secondary | ICD-10-CM | POA: Insufficient documentation

## 2018-10-26 DIAGNOSIS — F1290 Cannabis use, unspecified, uncomplicated: Secondary | ICD-10-CM | POA: Insufficient documentation

## 2018-10-26 DIAGNOSIS — T462X5A Adverse effect of other antidysrhythmic drugs, initial encounter: Secondary | ICD-10-CM | POA: Insufficient documentation

## 2018-10-26 DIAGNOSIS — I088 Other rheumatic multiple valve diseases: Secondary | ICD-10-CM | POA: Diagnosis not present

## 2018-10-26 DIAGNOSIS — E440 Moderate protein-calorie malnutrition: Secondary | ICD-10-CM | POA: Insufficient documentation

## 2018-10-27 ENCOUNTER — Observation Stay (HOSPITAL_COMMUNITY)
Admission: EM | Admit: 2018-10-27 | Discharge: 2018-10-28 | Disposition: A | Discharge status: Home / Self Care | Payer: Medicaid Other | Attending: Cardiovascular Disease | Admitting: Cardiovascular Disease

## 2018-10-27 ENCOUNTER — Observation Stay (HOSPITAL_COMMUNITY): Payer: Medicaid Other

## 2018-10-27 ENCOUNTER — Emergency Department (HOSPITAL_COMMUNITY): Payer: Medicaid Other

## 2018-10-27 ENCOUNTER — Encounter (HOSPITAL_COMMUNITY): Payer: Self-pay | Admitting: Emergency Medicine

## 2018-10-27 ENCOUNTER — Other Ambulatory Visit: Payer: Self-pay

## 2018-10-27 DIAGNOSIS — R001 Bradycardia, unspecified: Secondary | ICD-10-CM

## 2018-10-27 DIAGNOSIS — I482 Chronic atrial fibrillation, unspecified: Secondary | ICD-10-CM | POA: Diagnosis present

## 2018-10-27 DIAGNOSIS — R0789 Other chest pain: Secondary | ICD-10-CM

## 2018-10-27 HISTORY — DX: Chronic atrial fibrillation, unspecified: I48.20

## 2018-10-27 LAB — BASIC METABOLIC PANEL
Anion gap: 6 (ref 5–15)
Anion gap: 6 (ref 5–15)
BUN: 23 mg/dL — ABNORMAL HIGH (ref 6–20)
BUN: 19 mg/dL (ref 6–20)
CO2: 25 mmol/L (ref 22–32)
CO2: 24 mmol/L (ref 22–32)
Calcium: 8.7 mg/dL — ABNORMAL LOW (ref 8.9–10.3)
Calcium: 8.2 mg/dL — ABNORMAL LOW (ref 8.9–10.3)
Chloride: 104 mmol/L (ref 98–111)
Chloride: 106 mmol/L (ref 98–111)
Creatinine, Ser: 1.09 mg/dL (ref 0.61–1.24)
Creatinine, Ser: 0.94 mg/dL (ref 0.61–1.24)
GFR calc Af Amer: >60 mL/min (ref >60)
GFR calc Af Amer: >60 mL/min (ref >60)
GFR calc non Af Amer: >60 mL/min (ref >60)
GFR calc non Af Amer: >60 mL/min (ref >60)
Glucose, Bld: 136 mg/dL — ABNORMAL HIGH (ref 70–99)
Glucose, Bld: 83 mg/dL (ref 70–99)
Potassium: 4.2 mmol/L (ref 3.5–5.1)
Potassium: 4.1 mmol/L (ref 3.5–5.1)
Sodium: 135 mmol/L (ref 135–145)
Sodium: 136 mmol/L (ref 135–145)

## 2018-10-27 LAB — CBC
HCT: 39.1 % (ref 39.0–52.0)
Hemoglobin: 13.4 g/dL (ref 13.0–17.0)
MCH: 31.6 pg (ref 26.0–34.0)
MCHC: 34.3 g/dL (ref 30.0–36.0)
MCV: 92.2 fL (ref 80.0–100.0)
Platelets: 141 10*3/uL — ABNORMAL LOW (ref 150–400)
RBC: 4.24 MIL/uL (ref 4.22–5.81)
RDW: 11.8 % (ref 11.5–15.5)
WBC: 7.6 10*3/uL (ref 4.0–10.5)
nRBC: 0 % (ref 0.0–0.2)

## 2018-10-27 LAB — PROTIME-INR
INR: 4.1 (ref 0.8–1.2)
INR: 4.4 (ref 0.8–1.2)
Prothrombin Time: 39 seconds — ABNORMAL HIGH (ref 11.4–15.2)
Prothrombin Time: 41.2 seconds — ABNORMAL HIGH (ref 11.4–15.2)

## 2018-10-27 LAB — BRAIN NATRIURETIC PEPTIDE: B Natriuretic Peptide: 891.8 pg/mL — ABNORMAL HIGH (ref 0.0–100.0)

## 2018-10-27 LAB — HIV ANTIBODY (ROUTINE TESTING W REFLEX): HIV Screen 4th Generation wRfx: NONREACTIVE

## 2018-10-27 LAB — TSH: TSH: 0.584 u[IU]/mL (ref 0.350–4.500)

## 2018-10-27 LAB — TROPONIN I
Troponin I: 0.05 ng/mL (ref <0.03)
Troponin I: 0.05 ng/mL (ref <0.03)
Troponin I: 0.05 ng/mL (ref <0.03)

## 2018-10-27 MED ORDER — SODIUM CHLORIDE 0.9% FLUSH
3.0000 mL | Freq: Once | INTRAVENOUS | Status: AC
  Administered 2018-10-27: 3 mL via INTRAVENOUS

## 2018-10-27 MED ORDER — SODIUM CHLORIDE 0.9% FLUSH
3.0000 mL | Freq: Two times a day (BID) | INTRAVENOUS | Status: DC
  Administered 2018-10-27 – 2018-10-28 (×3): 3 mL via INTRAVENOUS

## 2018-10-27 MED ORDER — FUROSEMIDE 20 MG PO TABS
20.0000 mg | ORAL_TABLET | Freq: Every day | ORAL | Status: DC
  Administered 2018-10-27 – 2018-10-28 (×2): 20 mg via ORAL
  Filled 2018-10-27 (×2): qty 1

## 2018-10-27 MED ORDER — LOSARTAN POTASSIUM 25 MG PO TABS
25.0000 mg | ORAL_TABLET | Freq: Every day | ORAL | Status: DC
  Administered 2018-10-27 – 2018-10-28 (×2): 25 mg via ORAL
  Filled 2018-10-27 (×2): qty 1

## 2018-10-27 MED ORDER — AMOXICILLIN-POT CLAVULANATE 875-125 MG PO TABS
1.0000 | ORAL_TABLET | Freq: Two times a day (BID) | ORAL | Status: DC
  Administered 2018-10-27 – 2018-10-28 (×3): 1 via ORAL
  Filled 2018-10-27 (×3): qty 1

## 2018-10-27 MED ORDER — SODIUM CHLORIDE 0.9% FLUSH: 3.0000 mL | INTRAVENOUS | Status: DC | PRN

## 2018-10-27 MED ORDER — SODIUM CHLORIDE 0.9 % IV BOLUS
500.0000 mL | Freq: Once | INTRAVENOUS | Status: AC
  Administered 2018-10-27: 500 mL via INTRAVENOUS

## 2018-10-27 MED ORDER — SODIUM CHLORIDE 0.9 % IV SOLN: 250.0000 mL | INTRAVENOUS | Status: DC | PRN

## 2018-10-27 NOTE — ED Notes (Addendum)
ED TO INPATIENT HANDOFF REPORT  ED Nurse Name and Phone #: Sharene Skeans [phone]  S Name/Age/Gender Roseanna Rainbow Furgeson 43 y.o. male Room/Bed: WOTF/NONE  Code Status   Code Status: Full Code  Home/SNF/Other Home Patient oriented to: self, place, time and situation Is this baseline? Yes   Triage Complete: Triage complete  Chief Complaint chest pain  Triage Note No notes on file   Allergies Allergies  Allergen Reactions  . Aspirin Other (See Comments)    Internal bleeding     Level of Care/Admitting Diagnosis ED Disposition    ED Disposition Condition Comment   Admit  Hospital Area: [facility] [[id number]]  Level of Care: Telemetry [5]  Admit to tele based on following criteria: Complex arrhythmia (Bradycardia/Tachycardia)  Covid Evaluation: N/A  Diagnosis: Chronic atrial fibrillation [454098]  Admitting Physician: Orpah Cobb [[id number]]  Attending Physician: Orpah Cobb [[id number]]  PT Class (Do Not Modify): Observation [104]  PT Acc Code (Do Not Modify): Observation [10022]       B Medical/Surgery History Past Medical History:  Diagnosis Date  . Atrial fibrillation (HCC)   . CHF (congestive heart failure) (HCC)   . Dysrhythmia    hx. of  A-fib  . Internal bleeding    DUE TO ASPIRIN, OR MEDS  . Murmur   . Smoker    Past Surgical History:  Procedure Laterality Date  . ABDOMINAL SURGERY    . ABDOMINAL SURGERY     under 10 yrs. old or younger  . CARDIAC SURGERY    . CARDIOVERSION N/A 08/17/2017   Procedure: CARDIOVERSION;  Surgeon: Orpah Cobb, MD;  Location: [facility] ENDOSCOPY;  Service: Cardiovascular;  Laterality: N/A;  . HERNIA REPAIR    . INGUINAL HERNIA REPAIR Right 10/13/2017   Procedure: OPEN RIGHT INGUINAL HERNIA REPAIR ERAS PATHWAY;  Surgeon: Jimmye Norman, MD;  Location: [facility];  Service: General;  Laterality: Right;  . OPEN HEART SURGERY     AS A CHILD , pt. thinks he was under 10 yrs. old. done in [place]  . TEE WITHOUT CARDIOVERSION  N/A 08/17/2017   Procedure: TRANSESOPHAGEAL ECHOCARDIOGRAM (TEE);  Surgeon: Orpah Cobb, MD;  Location: [facility] ENDOSCOPY;  Service: Cardiovascular;  Laterality: N/A;     A IV Location/Drains/Wounds Patient Lines/Drains/Airways Status   Active Line/Drains/Airways    Name:   Placement date:   Placement time:   Site:   Days:   Peripheral IV 10/27/18 Right Forearm   10/27/18    0025    Forearm   less than 1   Incision (Closed) 10/13/17 Abdomen Other (Comment)   10/13/17    1747     379          Intake/Output Last 24 hours  Intake/Output Summary (Last 24 hours) at 10/27/2018 0511 Last data filed at 10/27/2018 0418 Gross per 24 hour  Intake 503 ml  Output -  Net 503 ml    Labs/Imaging Results for orders placed or performed during the hospital encounter of 10/27/18 (from the past 48 hour(s))  Basic metabolic panel     Status: Abnormal   Collection Time: 10/27/18 12:18 AM  Result Value Ref Range   Sodium 135 135 - 145 mmol/L   Potassium 4.2 3.5 - 5.1 mmol/L   Chloride 104 98 - 111 mmol/L   CO2 25 22 - 32 mmol/L   Glucose, Bld 136 (H) 70 - 99 mg/dL   BUN 23 (H) 6 - 20 mg/dL   Creatinine, Ser 1.19 0.61 - 1.24 mg/dL  Calcium 8.7 (L) 8.9 - 10.3 mg/dL   GFR calc non Af Amer >60 >60 mL/min   GFR calc Af Amer >60 >60 mL/min   Anion gap 6 5 - 15    Comment: Performed at [facility], [address] [place], [place] [address]  CBC     Status: Abnormal   Collection Time: 10/27/18 12:18 AM  Result Value Ref Range   WBC 7.6 4.0 - 10.5 K/uL   RBC 4.24 4.22 - 5.81 MIL/uL   Hemoglobin 13.4 13.0 - 17.0 g/dL   HCT 60.439.1 54.039.0 - 98.152.0 %   MCV 92.2 80.0 - 100.0 fL   MCH 31.6 26.0 - 34.0 pg   MCHC 34.3 30.0 - 36.0 g/dL   RDW 19.111.8 47.811.5 - 29.515.5 %   Platelets 141 (L) 150 - 400 K/uL    Comment: REPEATED TO VERIFY SPECIMEN CHECKED FOR CLOTS    nRBC 0.0 0.0 - 0.2 %    Comment: Performed at [facility], [address] [place], [place] [address]  Troponin I -  ONCE - STAT     Status: Abnormal   Collection Time: 10/27/18 12:18 AM  Result Value Ref Range   Troponin I 0.05 (HH) <0.03 ng/mL    Comment: CRITICAL RESULT CALLED TO, READ BACK BY AND VERIFIED WITH: J,TALKINGTON AT 0215 ON 10/27/18 BY A,MOHAMED Performed at [facility], [address] [place], [place] [address]   Brain natriuretic peptide     Status: Abnormal   Collection Time: 10/27/18 12:18 AM  Result Value Ref Range   B Natriuretic Peptide 891.8 (H) 0.0 - 100.0 pg/mL    Comment: Performed at [facility], [address] [place], [place] [address]  Protime-INR     Status: Abnormal   Collection Time: 10/27/18 12:18 AM  Result Value Ref Range   Prothrombin Time 39.0 (H) 11.4 - 15.2 seconds   INR 4.1 (HH) 0.8 - 1.2    Comment: CRITICAL RESULT CALLED TO, READ BACK BY AND VERIFIED WITH: J,Zyhir Cappella AT 0125 ON 10/27/18 BY A,MOHAMED (NOTE) INR goal varies based on device and disease states. Performed at [facility], [address] [place], [place] [address]    Dg Chest 2 View  Result Date: 10/27/2018 CLINICAL DATA:  Chest pain EXAM: CHEST - 2 VIEW COMPARISON:  10/25/2018 FINDINGS: Cardiac shadow is prominent but stable in appearance from the prior exam. The lungs are well aerated bilaterally. Calcified granuloma is noted in the right lung base. No focal confluent infiltrate is seen. Previously noted changes on CT have improved. No effusion is seen. No bony abnormality is noted. IMPRESSION: Previously seen infiltrative changes have improved in the interval Electronically Signed   By: Alcide CleverMark  Lukens M.D.   On: 10/27/2018 01:42   Dg Chest 2 View  Result Date: 10/25/2018 CLINICAL DATA:  Chest pain EXAM: CHEST - 2 VIEW COMPARISON:  October 22, 2018 FINDINGS: There is mild atelectatic change in the left mid lung. There has been significant clearing of infiltrate from the right base. A small amount of residual opacity remains in this area. There  is no edema or consolidation. There is cardiomegaly with pulmonary venous hypertension. No adenopathy. No bone lesions. IMPRESSION: Pulmonary vascular congestion. Significant clearing of infiltrate from the right base with small amount of residual opacity remaining in this area. Mild atelectasis left mid lung. Lungs elsewhere clear. No adenopathy demonstrable. Electronically Signed   By: Bretta BangWilliam  Woodruff III M.D.  On: 10/25/2018 09:43   Ct Angio Chest Pe W/cm &/or Wo Cm  Result Date: 10/25/2018 CLINICAL DATA:  Chest pain EXAM: CT ANGIOGRAPHY CHEST WITH CONTRAST TECHNIQUE: Multidetector CT imaging of the chest was performed using the standard protocol during bolus administration of intravenous contrast. Multiplanar CT image reconstructions and MIPs were obtained to evaluate the vascular anatomy. CONTRAST:  55mL OMNIPAQUE IOHEXOL 350 MG/ML SOLN COMPARISON:  CT angiogram chest August 10, 2009; chest radiographs October 22, 2018 and October 25, 2018 FINDINGS: Cardiovascular: There is no demonstrable pulmonary embolus. There is no thoracic aortic aneurysm. No dissection is seen in the aorta; the contrast bolus within the aorta is not sufficient to assess for potential dissection from a radiographic standpoint. The visualized great vessels appear unremarkable. Note that the supreme intercostal vein on the left and left hemiazygous system remain dilated, stable from 2011. Left-sided aortic arch present. There is cardiomegaly with primarily left-sided enlargement. There is no pericardial effusion or pericardial thickening evident. Main pulmonary outflow tract measures 4.0 cm, enlarged. Mediastinum/Nodes: No appreciable thyroid lesion. There is no appreciable thoracic adenopathy. No esophageal lesions are evident. Lungs/Pleura: There is a calcified granuloma in the anterior segment of the right lower lobe. There Is patchy infiltrate in the posterior right base. There is also bibasilar and left mid lung atelectasis. There  is no appreciable interstitial edema. No appreciable pleural effusion or pleural thickening. Upper Abdomen: There is reflux of contrast into the inferior vena cava and hepatic veins. There appears to be splenic tissue on the right, stable from previous study. Liver is on the right side. Left kidney is in normal flank position. Right kidney not seen in visualized upper abdomen. Musculoskeletal: There is thoracic dextroscoliosis. There are no blastic or lytic bone lesions. No evident chest wall lesions. Review of the MIP images confirms the above findings. IMPRESSION: 1. No demonstrable pulmonary embolus. No thoracic aortic aneurysm. Contrast bolus in the aorta is not sufficient to assess for dissection. No dissection evident currently. 2. Enlargement of the main pulmonary outflow tract, consistent with pulmonary arterial hypertension. 3. Cardiomegaly with predominantly left heart enlargement. Dilated hemiazygous system and supreme intercostal vein on the left. Appearance stable compared to prior study. 4. Patchy infiltrate posterior right base, apparently less than on recent chest radiograph. Small granuloma right base. Areas of patchy atelectasis. No new consolidation compared to recent chest radiograph. 5. Reflux of contrast into the inferior vena cava and hepatic veins may well represent a degree of increase in right heart pressure. 6.  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 7.   No demonstrable adenopathy. Electronically Signed   By: Bretta Bang III M.D.   On: 10/25/2018 13:35    Pending Labs Unresulted Labs (From admission, onward)    Start     Ordered   10/27/18 1100  Troponin I - Once  Once,   R     10/27/18 0444   10/27/18 0500  Basic metabolic panel  Tomorrow morning,   R     10/27/18 0422   10/27/18 0500  Protime-INR  Daily,   R     10/27/18 0422   10/27/18 0427  TSH  Add-on,   R     10/27/18 0426   10/27/18 0415  HIV antibody (Routine Testing)  Once,   R     10/27/18  0422   10/27/18 0301  Troponin I - ONCE - STAT  ONCE - STAT,   STAT     10/27/18 0300  Vitals/Pain Today's Vitals   10/27/18 0415 10/27/18 0430 10/27/18 0445 10/27/18 0500  BP:  103/60  98/77  Pulse: (!) 53 (!) 55 80 62  Resp: 20 (!) 27 20 20   Temp:      TempSrc:      SpO2: 99% 94% 99% 98%  Weight:      Height:      PainSc:        Isolation Precautions No active isolations  Medications Medications  sodium chloride flush (NS) 0.9 % injection 3 mL (has no administration in time range)  sodium chloride flush (NS) 0.9 % injection 3 mL (has no administration in time range)  0.9 %  sodium chloride infusion (has no administration in time range)  amoxicillin-clavulanate (AUGMENTIN) 875-125 MG per tablet 1 tablet (has no administration in time range)  furosemide (LASIX) tablet 20 mg (has no administration in time range)  losartan (COZAAR) tablet 25 mg (has no administration in time range)  sodium chloride flush (NS) 0.9 % injection 3 mL (3 mLs Intravenous Given 10/27/18 0026)  sodium chloride 0.9 % bolus 500 mL (0 mLs Intravenous Stopped 10/27/18 0418)    Mobility walks Low fall risk   Focused Assessments Cardiac Assessment Handoff:  Cardiac Rhythm: Atrial fibrillation Lab Results  Component Value Date   CKTOTAL 263 (H) 08/10/2009   CKMB 1.8 08/10/2009   TROPONINI 0.05 (HH) 10/27/2018   Lab Results  Component Value Date   DDIMER (H) 08/10/2009    1.08        AT THE INHOUSE ESTABLISHED CUTOFF VALUE OF 0.48 ug/mL FEU, THIS ASSAY HAS BEEN DOCUMENTED IN THE LITERATURE TO HAVE A SENSITIVITY AND NEGATIVE PREDICTIVE VALUE OF AT LEAST 98 TO 99%.  THE TEST RESULT SHOULD BE CORRELATED WITH AN ASSESSMENT OF THE CLINICAL PROBABILITY OF DVT / VTE.   Does the Patient currently have chest pain? No     R Recommendations: See Admitting Provider Note  Report given to: Tanya RN  Additional Notes:  Patient took all belongings with him.

## 2018-10-27 NOTE — Progress Notes (Signed)
  Echocardiogram 2D Echocardiogram has been performed.  Delcie Roch 10/27/2018, 2:05 PM

## 2018-10-27 NOTE — ED Notes (Addendum)
CRITICAL VALUE STICKER  CRITICAL VALUE: Trop 0.05  RECEIVER (on-site recipient of call): Jake T   DATE & TIME NOTIFIED: 10/27/18, 219a  MESSENGER (representative from lab): Ellan Lambert  MD NOTIFIED: Rancour MD  TIME OF NOTIFICATION: 1594  RESPONSE: see orders

## 2018-10-27 NOTE — ED Notes (Signed)
Date and time results received: 10/27/18 0125 (use smartphrase ".now" to insert current time)  Test: INR Critical Value: 4.1  Name of Provider Notified: Rancour MD  Orders Received? Or Actions Taken?: no new orders

## 2018-10-27 NOTE — Progress Notes (Signed)
Notified Dr. Algie Coffer of 2.13 second pause and Troponin 0.05.  Pending echo.

## 2018-10-27 NOTE — ED Notes (Signed)
Consult to Cardiology@02 :35am.

## 2018-10-27 NOTE — ED Provider Notes (Signed)
[facility]-EMERGENCY DEPT Provider [name]   CSN: [id number] Arrival date & time: 10/26/18  2356    History   Chief Complaint Chief Complaint  Patient presents with  . Chest Pain    HPI Gary [name] is a 43 y.o. male.     Patient with history of atrial fibrillation on Coumadin, CHF, EF 45-50%, tobacco abuse presenting with episode of palpitations, lightheadedness and "feeling like my heart was stopping".  States he went to bed feeling well but he woke up to answer the phone and felt like his heartbeat was very slow in the 30 range.  He felt lightheaded like he was going to pass out.  This was associated with dizziness and lightheadedness positions improved.  He did have a brief episode of left-sided chest pain that lasted for less than a minute and is since resolved. Notably patient has had several recent ED visits for chest pain and is currently being treated for pneumonia and has several days worth of antibiotics left.  He was found to have positive troponins on his previous ED visit.  His cardiologist Dr. Sharyn Lull recently increased his amiodarone for the past 3 days because of increased palpitations and patient was supposed to go back to his usual amiodarone dose tomorrow. He was concerned about having a heart attack tonight and was concerned that he was "going to leave this earth".  He denies any chest pain or shortness of breath currently.  The history is provided by the patient.  Chest Pain  Associated symptoms: dizziness and palpitations   Associated symptoms: no abdominal pain, no headache, no nausea, no shortness of breath, no vomiting and no weakness     Past Medical History:  Diagnosis Date  . Atrial fibrillation (HCC)   . CHF (congestive heart failure) (HCC)   . Dysrhythmia    hx. of  A-fib  . Internal bleeding    DUE TO ASPIRIN, OR MEDS  . Murmur   . Smoker     Patient Active Problem List   Diagnosis Date Noted  . Protein-calorie  malnutrition, severe 10/17/2017  . Incarcerated inguinal hernia 10/13/2017  . Acute congestive heart failure (HCC) 08/14/2017    Past Surgical History:  Procedure Laterality Date  . ABDOMINAL SURGERY    . ABDOMINAL SURGERY     under 10 yrs. old or younger  . CARDIAC SURGERY    . CARDIOVERSION N/A 08/17/2017   Procedure: CARDIOVERSION;  Surgeon: Orpah Cobb, MD;  Location: [facility] ENDOSCOPY;  Service: Cardiovascular;  Laterality: N/A;  . HERNIA REPAIR    . INGUINAL HERNIA REPAIR Right 10/13/2017   Procedure: OPEN RIGHT INGUINAL HERNIA REPAIR ERAS PATHWAY;  Surgeon: Jimmye Norman, MD;  Location: [facility] OR;  Service: General;  Laterality: Right;  . OPEN HEART SURGERY     AS A CHILD , pt. thinks he was under 10 yrs. old. done in [place]  . TEE WITHOUT CARDIOVERSION N/A 08/17/2017   Procedure: TRANSESOPHAGEAL ECHOCARDIOGRAM (TEE);  Surgeon: Orpah Cobb, MD;  Location: [facility] ENDOSCOPY;  Service: Cardiovascular;  Laterality: N/A;        Home Medications    Prior to Admission medications   Medication Sig Start Date End Date Taking? Authorizing Provider  amiodarone (PACERONE) 200 MG tablet Take 1 tablet (200 mg total) by mouth daily. Patient taking differently: Take 100 mg by mouth daily.  08/22/17   Rinaldo Cloud, MD  amoxicillin-clavulanate (AUGMENTIN) 875-125 MG tablet Take 1 tablet by mouth every 12 (twelve) hours for 7 days.  10/22/18 10/29/18  Ina Kick, MD  azithromycin (ZITHROMAX) 250 MG tablet Take 1 tablet (250 mg total) by mouth daily for 4 days. Take first 2 tablets together, then 1 every day until finished. 10/23/18 10/27/18  Ina Kick, MD  docusate sodium (COLACE) 100 MG capsule Take 1 capsule (100 mg total) by mouth 2 (two) times daily as needed for mild constipation. Patient not taking: Reported on 09/21/2018 10/26/17   Blue, Olivia C, PA-C  fluocinonide cream (LIDEX) 0.05 % Apply 1 application topically 2 (two) times daily. Patient not taking: Reported on 09/21/2018 08/30/18    Mardella Layman, MD  furosemide (LASIX) 20 MG tablet Take 1 tablet (20 mg total) by mouth daily. 09/21/18   Dartha Lodge, PA-C  losartan (COZAAR) 25 MG tablet Take 1 tablet (25 mg total) by mouth daily. 08/22/17   Rinaldo Cloud, MD  warfarin (COUMADIN) 5 MG tablet Take 1 tablet (5 mg total) by mouth one time only at 6 PM. Patient taking differently: Take 5-7.5 mg by mouth See admin instructions. Take 7.5mg  on MON, WED, FRI and 5mg  on all other days. 08/21/17   Rinaldo Cloud, MD    Family History History reviewed. No pertinent family history.  Social History Social History   Tobacco Use  . Smoking status: Former Smoker    Packs/day: 1.00    Years: 20.00    Pack years: 20.00    Types: Cigarettes  . Smokeless tobacco: Never Used  Substance Use Topics  . Alcohol use: Not Currently    Comment: social  . Drug use: Not Currently    Types: Marijuana     Allergies   Aspirin   Review of Systems Review of Systems  Constitutional: Negative for activity change and appetite change.  HENT: Negative for congestion and rhinorrhea.   Respiratory: Positive for chest tightness. Negative for shortness of breath.   Cardiovascular: Positive for chest pain and palpitations.  Gastrointestinal: Negative for abdominal pain, nausea and vomiting.  Genitourinary: Negative for dysuria and hematuria.  Musculoskeletal: Negative for arthralgias and myalgias.  Skin: Negative for rash.  Neurological: Positive for dizziness and light-headedness. Negative for weakness and headaches.   all other systems are negative except as noted in the HPI and PMH.      Physical Exam Updated Vital Signs BP (!) 110/91 (BP Location: Left Arm)   Pulse (!) 50   Temp 97.8 F (36.6 C) (Oral)   Resp 16   Ht 5\' 5"  (1.651 m)   Wt 54.4 kg   SpO2 99%   BMI 19.96 kg/m   Physical Exam Vitals signs and nursing [name] reviewed.  Constitutional:      General: He is not in acute distress.    Appearance: He is  well-developed.  HENT:     Head: Normocephalic and atraumatic.     Mouth/Throat:     Pharynx: No oropharyngeal exudate.  Eyes:     Conjunctiva/sclera: Conjunctivae normal.     Pupils: Pupils are equal, round, and reactive to light.  Neck:     Musculoskeletal: Normal range of motion and neck supple.     Comments: No meningismus. Cardiovascular:     Rate and Rhythm: Normal rate. Rhythm irregular.     Heart sounds: Murmur present.     Comments: Irregular atrial fibrillation on the monitor, rates 40s and 70s Pulmonary:     Effort: Pulmonary effort is normal. No respiratory distress.     Breath sounds: Normal breath sounds.     Comments: Well  healed surgical scar Abdominal:     Palpations: Abdomen is soft.     Tenderness: There is no abdominal tenderness. There is no guarding or rebound.  Musculoskeletal: Normal range of motion.        General: No tenderness.  Skin:    General: Skin is warm.     Capillary Refill: Capillary refill takes less than 2 seconds.  Neurological:     General: No focal deficit present.     Mental Status: He is alert and oriented to person, place, and time. Mental status is at baseline.     Cranial Nerves: No cranial nerve deficit.     Motor: No abnormal muscle tone.     Coordination: Coordination normal.     Comments:  5/5 strength throughout. CN 2-12 intact.Equal grip strength.   Psychiatric:        Behavior: Behavior normal.      ED Treatments / Results  Labs (all labs ordered are listed, but only abnormal results are displayed) Labs Reviewed  BASIC METABOLIC PANEL - Abnormal; Notable for the following components:      Result Value   Glucose, Bld 136 (*)    BUN 23 (*)    Calcium 8.7 (*)    All other components within normal limits  CBC - Abnormal; Notable for the following components:   Platelets 141 (*)    All other components within normal limits  TROPONIN I - Abnormal; Notable for the following components:   Troponin I 0.05 (*)    All  other components within normal limits  BRAIN NATRIURETIC PEPTIDE - Abnormal; Notable for the following components:   B Natriuretic Peptide 891.8 (*)    All other components within normal limits  PROTIME-INR - Abnormal; Notable for the following components:   Prothrombin Time 39.0 (*)    INR 4.1 (*)    All other components within normal limits  TROPONIN I    EKG EKG Interpretation  Date/Time:  Saturday October 27 2018 00:08:52 EDT Ventricular Rate:  72 PR Interval:    QRS Duration: 112 QT Interval:  440 QTC Calculation: 482 R Axis:   62 Text Interpretation:  Atrial fibrillation Abnormal R-wave progression, late transition LVH with IVCD and secondary repol abnrm Minimal ST elevation, anterolateral leads Borderline prolonged QT interval No significant change was found similar to previous Confirmed by Glynn Octaveancour, Jacqulynn Shappell [phone]([id number]) on 10/27/2018 12:19:58 AM   Radiology Dg Chest 2 View  Result Date: 10/25/2018 CLINICAL DATA:  Chest pain EXAM: CHEST - 2 VIEW COMPARISON:  October 22, 2018 FINDINGS: There is mild atelectatic change in the left mid lung. There has been significant clearing of infiltrate from the right base. A small amount of residual opacity remains in this area. There is no edema or consolidation. There is cardiomegaly with pulmonary venous hypertension. No adenopathy. No bone lesions. IMPRESSION: Pulmonary vascular congestion. Significant clearing of infiltrate from the right base with small amount of residual opacity remaining in this area. Mild atelectasis left mid lung. Lungs elsewhere clear. No adenopathy demonstrable. Electronically Signed   By: Bretta BangWilliam  Woodruff III M.D.   On: 10/25/2018 09:43   Ct Angio Chest Pe W/cm &/or Wo Cm  Result Date: 10/25/2018 CLINICAL DATA:  Chest pain EXAM: CT ANGIOGRAPHY CHEST WITH CONTRAST TECHNIQUE: Multidetector CT imaging of the chest was performed using the standard protocol during bolus administration of intravenous contrast. Multiplanar CT  image reconstructions and MIPs were obtained to evaluate the vascular anatomy. CONTRAST:  75mL OMNIPAQUE IOHEXOL 350 MG/ML  SOLN COMPARISON:  CT angiogram chest August 10, 2009; chest radiographs October 22, 2018 and October 25, 2018 FINDINGS: Cardiovascular: There is no demonstrable pulmonary embolus. There is no thoracic aortic aneurysm. No dissection is seen in the aorta; the contrast bolus within the aorta is not sufficient to assess for potential dissection from a radiographic standpoint. The visualized great vessels appear unremarkable. [name] that the supreme intercostal vein on the left and left hemiazygous system remain dilated, stable from 2011. Left-sided aortic arch present. There is cardiomegaly with primarily left-sided enlargement. There is no pericardial effusion or pericardial thickening evident. Main pulmonary outflow tract measures 4.0 cm, enlarged. Mediastinum/Nodes: No appreciable thyroid lesion. There is no appreciable thoracic adenopathy. No esophageal lesions are evident. Lungs/Pleura: There is a calcified granuloma in the anterior segment of the right lower lobe. There Is patchy infiltrate in the posterior right base. There is also bibasilar and left mid lung atelectasis. There is no appreciable interstitial edema. No appreciable pleural effusion or pleural thickening. Upper Abdomen: There is reflux of contrast into the inferior vena cava and hepatic veins. There appears to be splenic tissue on the right, stable from previous study. Liver is on the right side. Left kidney is in normal flank position. Right kidney not seen in visualized upper abdomen. Musculoskeletal: There is thoracic dextroscoliosis. There are no blastic or lytic bone lesions. No evident chest wall lesions. Review of the MIP images confirms the above findings. IMPRESSION: 1. No demonstrable pulmonary embolus. No thoracic aortic aneurysm. Contrast bolus in the aorta is not sufficient to assess for dissection. No dissection evident  currently. 2. Enlargement of the main pulmonary outflow tract, consistent with pulmonary arterial hypertension. 3. Cardiomegaly with predominantly left heart enlargement. Dilated hemiazygous system and supreme intercostal vein on the left. Appearance stable compared to prior study. 4. Patchy infiltrate posterior right base, apparently less than on recent chest radiograph. Small granuloma right base. Areas of patchy atelectasis. No new consolidation compared to recent chest radiograph. 5. Reflux of contrast into the inferior vena cava and hepatic veins may well represent a degree of increase in right heart pressure. 6.  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 7.   No demonstrable adenopathy. Electronically Signed   By: Bretta Bang III M.D.   On: 10/25/2018 13:35    Procedures Procedures (including critical care time)  Medications Ordered in ED Medications  sodium chloride flush (NS) 0.9 % injection 3 mL (has no administration in time range)     Initial Impression / Assessment and Plan / ED Course  I have reviewed the triage vital signs and the nursing notes.  Pertinent labs & imaging results that were available during my care of the patient were reviewed by me and considered in my medical decision making (see chart for details).       Palpitations with recent adjustment of amiodarone dose.  Heart rates ranging from 40s to 70s.  EKG shows atrial fibrillation with anterolateral ST elevation likely secondary to LVH which is unchanged.  Patient with a regular bradycardia as well as 35 with rates between 60 and 70.  Intermittent left-sided chest pain that comes and goes lasting for a few seconds at a time.  Seems atypical for ACS.  Troponin minimally elevated as previous.  Chest x-ray shows resolution of recent infiltrate.  CT angiogram chest 2 days ago showed no pulmonary embolism.   patient still with irregular bradycardia likely secondary to recently increased amiodarone  dose.  Discussed with cardiology Dr. Algie Coffer who  is covering for Dr. Sharyn Lull.  He agrees patient would benefit from observation admission.  Patient remains hemodynamically stable in the ED with slight hypotension which responded well to gentle IV fluids.  Heart rate remains variable in the 40-70 range.  Admission for observation given his symptomatic bradycardia as discussed with Dr. Algie Coffer.  CRITICAL CARE Performed by: Glynn Octave Total critical care time: 32 minutes Critical care time was exclusive of separately billable procedures and treating other patients. Critical care was necessary to treat or prevent imminent or life-threatening deterioration. Critical care was time spent personally by me on the following activities: development of treatment plan with patient and/or surrogate as well as nursing, discussions with consultants, evaluation of patient's response to treatment, examination of patient, obtaining history from patient or surrogate, ordering and performing treatments and interventions, ordering and review of laboratory studies, ordering and review of radiographic studies, pulse oximetry and re-evaluation of patient's condition.   Final Clinical Impressions(s) / ED Diagnoses   Final diagnoses:  Symptomatic bradycardia  Atypical chest pain    ED Discharge Orders    None       Ramsey Midgett, Jeannett Senior, MD 10/27/18 [id number]

## 2018-10-27 NOTE — H&P (Signed)
Referring Physician:  [name] is an 43 y.o. male.                       Chief Complaint: Chest pain and bradycardia  HPI: 43 years old male with chronic congestive heart failure had chest pain, shortness of breath and episodes of bradycardia post recent increase in amiodarone. He has chronic atrial fibrillation with high grade AV block. His troponin is minimally elevated and his BNP is elevated at 891.8, His PT/INR is also mildly elevated at 4.1.  Past Medical History:  Diagnosis Date  . Atrial fibrillation (HCC)   . CHF (congestive heart failure) (HCC)   . Dysrhythmia    hx. of  A-fib  . Internal bleeding    DUE TO ASPIRIN, OR MEDS  . Murmur   . Smoker       Past Surgical History:  Procedure Laterality Date  . ABDOMINAL SURGERY    . ABDOMINAL SURGERY     under 10 yrs. old or younger  . CARDIAC SURGERY    . CARDIOVERSION N/A 08/17/2017   Procedure: CARDIOVERSION;  Surgeon: Orpah Cobb, MD;  Location: [facility] ENDOSCOPY;  Service: Cardiovascular;  Laterality: N/A;  . HERNIA REPAIR    . INGUINAL HERNIA REPAIR Right 10/13/2017   Procedure: OPEN RIGHT INGUINAL HERNIA REPAIR ERAS PATHWAY;  Surgeon: Jimmye Norman, MD;  Location: [facility] OR;  Service: General;  Laterality: Right;  . OPEN HEART SURGERY     AS A CHILD , pt. thinks he was under 10 yrs. old. done in [place]  . TEE WITHOUT CARDIOVERSION N/A 08/17/2017   Procedure: TRANSESOPHAGEAL ECHOCARDIOGRAM (TEE);  Surgeon: Orpah Cobb, MD;  Location: [facility] ENDOSCOPY;  Service: Cardiovascular;  Laterality: N/A;    History reviewed. No pertinent family history. Social History:  reports that he has quit smoking. His smoking use included cigarettes. He has a 20.00 pack-year smoking history. He has never used smokeless tobacco. He reports previous alcohol use. He reports previous drug use. Drug: Marijuana.  Allergies:  Allergies  Allergen Reactions  . Aspirin Other (See Comments)    Internal bleeding     (Not in a hospital  admission)   Results for orders placed or performed during the hospital encounter of 10/27/18 (from the past 48 hour(s))  Basic metabolic panel     Status: Abnormal   Collection Time: 10/27/18 12:18 AM  Result Value Ref Range   Sodium 135 135 - 145 mmol/L   Potassium 4.2 3.5 - 5.1 mmol/L   Chloride 104 98 - 111 mmol/L   CO2 25 22 - 32 mmol/L   Glucose, Bld 136 (H) 70 - 99 mg/dL   BUN 23 (H) 6 - 20 mg/dL   Creatinine, Ser 6.56 0.61 - 1.24 mg/dL   Calcium 8.7 (L) 8.9 - 10.3 mg/dL   GFR calc non Af Amer >60 >60 mL/min   GFR calc Af Amer >60 >60 mL/min   Anion gap 6 5 - 15    Comment: Performed at [facility], [address], [place], [place] [address]  CBC     Status: Abnormal   Collection Time: 10/27/18 12:18 AM  Result Value Ref Range   WBC 7.6 4.0 - 10.5 K/uL   RBC 4.24 4.22 - 5.81 MIL/uL   Hemoglobin 13.4 13.0 - 17.0 g/dL   HCT 17.0 01.7 - 49.4 %   MCV 92.2 80.0 - 100.0 fL   MCH 31.6 26.0 - 34.0 pg   MCHC 34.3  30.0 - 36.0 g/dL   RDW 16.111.8 09.611.5 - 04.515.5 %   Platelets 141 (L) 150 - 400 K/uL    Comment: REPEATED TO VERIFY SPECIMEN CHECKED FOR CLOTS    nRBC 0.0 0.0 - 0.2 %    Comment: Performed at [facility], [address] [place], [place] [address]  Troponin I - ONCE - STAT     Status: Abnormal   Collection Time: 10/27/18 12:18 AM  Result Value Ref Range   Troponin I 0.05 (HH) <0.03 ng/mL    Comment: CRITICAL RESULT CALLED TO, READ BACK BY AND VERIFIED WITH: J,TALKINGTON AT 0215 ON 10/27/18 BY A,MOHAMED Performed at [facility], [address] [place], [place] [address]   Brain natriuretic peptide     Status: Abnormal   Collection Time: 10/27/18 12:18 AM  Result Value Ref Range   B Natriuretic Peptide 891.8 (H) 0.0 - 100.0 pg/mL    Comment: Performed at [facility] [place] [facility], [address] [place], [place] [address]  Protime-INR     Status: Abnormal   Collection Time: 10/27/18 12:18 AM  Result  Value Ref Range   Prothrombin Time 39.0 (H) 11.4 - 15.2 seconds   INR 4.1 (HH) 0.8 - 1.2    Comment: CRITICAL RESULT CALLED TO, READ BACK BY AND VERIFIED WITH: J,NASH AT 0125 ON 10/27/18 BY A,MOHAMED (NOTE) INR goal varies based on device and disease states. Performed at [facility], [address] [place], [place] [address]    Dg Chest 2 View  Result Date: 10/27/2018 CLINICAL DATA:  Chest pain EXAM: CHEST - 2 VIEW COMPARISON:  10/25/2018 FINDINGS: Cardiac shadow is prominent but stable in appearance from the prior exam. The lungs are well aerated bilaterally. Calcified granuloma is noted in the right lung base. No focal confluent infiltrate is seen. Previously noted changes on CT have improved. No effusion is seen. No bony abnormality is noted. IMPRESSION: Previously seen infiltrative changes have improved in the interval Electronically Signed   By: Alcide CleverMark  Lukens M.D.   On: 10/27/2018 01:42   Dg Chest 2 View  Result Date: 10/25/2018 CLINICAL DATA:  Chest pain EXAM: CHEST - 2 VIEW COMPARISON:  October 22, 2018 FINDINGS: There is mild atelectatic change in the left mid lung. There has been significant clearing of infiltrate from the right base. A small amount of residual opacity remains in this area. There is no edema or consolidation. There is cardiomegaly with pulmonary venous hypertension. No adenopathy. No bone lesions. IMPRESSION: Pulmonary vascular congestion. Significant clearing of infiltrate from the right base with small amount of residual opacity remaining in this area. Mild atelectasis left mid lung. Lungs elsewhere clear. No adenopathy demonstrable. Electronically Signed   By: Bretta BangWilliam  Woodruff III M.D.   On: 10/25/2018 09:43   Ct Angio Chest Pe W/cm &/or Wo Cm  Result Date: 10/25/2018 CLINICAL DATA:  Chest pain EXAM: CT ANGIOGRAPHY CHEST WITH CONTRAST TECHNIQUE: Multidetector CT imaging of the chest was performed using the standard protocol during bolus  administration of intravenous contrast. Multiplanar CT image reconstructions and MIPs were obtained to evaluate the vascular anatomy. CONTRAST:  75mL OMNIPAQUE IOHEXOL 350 MG/ML SOLN COMPARISON:  CT angiogram chest August 10, 2009; chest radiographs October 22, 2018 and October 25, 2018 FINDINGS: Cardiovascular: There is no demonstrable pulmonary embolus. There is no thoracic aortic aneurysm. No dissection is seen in the aorta; the contrast bolus within the aorta is not sufficient to assess for potential dissection from a  radiographic standpoint. The visualized great vessels appear unremarkable. Note that the supreme intercostal vein on the left and left hemiazygous system remain dilated, stable from 2011. Left-sided aortic arch present. There is cardiomegaly with primarily left-sided enlargement. There is no pericardial effusion or pericardial thickening evident. Main pulmonary outflow tract measures 4.0 cm, enlarged. Mediastinum/Nodes: No appreciable thyroid lesion. There is no appreciable thoracic adenopathy. No esophageal lesions are evident. Lungs/Pleura: There is a calcified granuloma in the anterior segment of the right lower lobe. There Is patchy infiltrate in the posterior right base. There is also bibasilar and left mid lung atelectasis. There is no appreciable interstitial edema. No appreciable pleural effusion or pleural thickening. Upper Abdomen: There is reflux of contrast into the inferior vena cava and hepatic veins. There appears to be splenic tissue on the right, stable from previous study. Liver is on the right side. Left kidney is in normal flank position. Right kidney not seen in visualized upper abdomen. Musculoskeletal: There is thoracic dextroscoliosis. There are no blastic or lytic bone lesions. No evident chest wall lesions. Review of the MIP images confirms the above findings. IMPRESSION: 1. No demonstrable pulmonary embolus. No thoracic aortic aneurysm. Contrast bolus in the aorta is not  sufficient to assess for dissection. No dissection evident currently. 2. Enlargement of the main pulmonary outflow tract, consistent with pulmonary arterial hypertension. 3. Cardiomegaly with predominantly left heart enlargement. Dilated hemiazygous system and supreme intercostal vein on the left. Appearance stable compared to prior study. 4. Patchy infiltrate posterior right base, apparently less than on recent chest radiograph. Small granuloma right base. Areas of patchy atelectasis. No new consolidation compared to recent chest radiograph. 5. Reflux of contrast into the inferior vena cava and hepatic veins may well represent a degree of increase in right heart pressure. 6.  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 7.   No demonstrable adenopathy. Electronically Signed   By: Bretta Bang III M.D.   On: 10/25/2018 13:35    Review Of Systems Constitutional: No fever, chills, chronic weight loss. Eyes: No vision change, wears glasses. No discharge or pain. Ears: No hearing loss, No tinnitus. Respiratory: No asthma, COPD, positive pneumonias. Positive shortness of breath. No hemoptysis. Cardiovascular: Positive chest pain, palpitation, leg edema. Gastrointestinal: No nausea, vomiting, diarrhea, constipation. No GI bleed. No hepatitis. Genitourinary: No dysuria, hematuria, kidney stone. No incontinance. Neurological: No headache, stroke, seizures.  Psychiatry: No psych facility admission for anxiety, depression, suicide. No detox. Skin: No rash. Musculoskeletal: No joint pain, fibromyalgia. No neck pain, back pain. Lymphadenopathy: No lymphadenopathy. Hematology: No anemia or easy bruising.   Blood pressure 98/63, pulse (!) 53, temperature 98.2 F (36.8 C), temperature source Oral, resp. rate 20, height  (1.651 m), weight 54.4 kg, SpO2 99 %. Body mass index is 19.96 kg/m. General appearance: alert, cooperative, appears stated age and mild distress Head: Normocephalic,  atraumatic. Eyes: Brown eyes, pink conjunctiva, corneas clear. PERRL, EOM's intact. Neck: No adenopathy, no carotid bruit, no JVD, supple, symmetrical, trachea midline and thyroid not enlarged. Resp: Harsh to auscultation bilaterally. Cardio: Regular rate and rhythm, S1, S2 normal, II/VI systolic murmur, no click, rub or gallop GI: Soft, non-tender; bowel sounds normal; no organomegaly. Extremities: No edema, cyanosis or clubbing. Skin: Warm and dry.  Neurologic: Alert and oriented X 3, normal strength. Normal coordination and gait.  Assessment/Plan Chest pain Dilated cardiomyopathy Chronic congestive heart failure Atrial fibrillation with high grade AV block, CHA2DS2VASc score of 2 Tobacco use disorder Chronic coumadin use Moderate protein  calorie malnutrition  Place in observation Check TSH. Hold amiodarone and warfarin.  Ricki Rodriguez, MD  10/27/2018, 4:28 AM

## 2018-10-27 NOTE — ED Notes (Signed)
Ordered EKG given to Dr. Manus Gunning and a verbal repeat order was requested. That EKG given to Dr. Manus Gunning as well.

## 2018-10-28 DIAGNOSIS — T363X5A Adverse effect of macrolides, initial encounter: Secondary | ICD-10-CM | POA: Diagnosis not present

## 2018-10-28 DIAGNOSIS — T45515A Adverse effect of anticoagulants, initial encounter: Secondary | ICD-10-CM | POA: Diagnosis not present

## 2018-10-28 DIAGNOSIS — I482 Chronic atrial fibrillation, unspecified: Secondary | ICD-10-CM | POA: Diagnosis not present

## 2018-10-28 DIAGNOSIS — T462X5A Adverse effect of other antidysrhythmic drugs, initial encounter: Secondary | ICD-10-CM | POA: Diagnosis not present

## 2018-10-28 LAB — PROTIME-INR
INR: 3.4 — ABNORMAL HIGH (ref 0.8–1.2)
Prothrombin Time: 34 seconds — ABNORMAL HIGH (ref 11.4–15.2)

## 2018-10-28 LAB — ECHOCARDIOGRAM LIMITED
Height: 65 in
Weight: 1918.88 oz

## 2018-10-28 MED ORDER — WARFARIN SODIUM 5 MG PO TABS: 5.0000 mg | ORAL_TABLET | Freq: Every day | ORAL | 1 refills | Status: DC

## 2018-10-28 MED ORDER — AMIODARONE HCL 200 MG PO TABS: 100.0000 mg | ORAL_TABLET | Freq: Every day | ORAL | Status: DC

## 2018-10-28 NOTE — Plan of Care (Signed)
Discharge instructions reviewed with patient, verbalized understanding of management of condition and changes to medications. Pt ambulatory to front entrance for discharge, this nurse accompanying.

## 2018-10-28 NOTE — Discharge Summary (Signed)
Physician Discharge Summary  Patient ID: Gary [name] MRN: [id number] DOB/AGE: 1976-07-03 43 y.o.  Admit date: 10/27/2018 Discharge date: 10/28/2018  Admission Diagnoses: Chest pain Dilated cardiomyopathy Chronic congestive heart failure Atrial fibrillation with high grade AV block, CHA2DS2VASc score of 2 Tobacco use disorder Chronic coumadin use Moderate Protein calorie malnutrition  Discharge Diagnoses:  Principle problem: Chronic atrial fibrillation, CHA2DS2VASc score of 2 Active Problems:   Adverse effect of Warfarin, azihtromycin and amiodarone   Supra-therapeutic INR, resolved   Moderate MR and TR   Mild AI and PI   LV non-compaction   Dilated cardiomyopathy   Tobacco use disorder   Chronic coumadin use   Moderate protein calorie malnutrition  Discharged Condition: fair  Hospital Course: 43 years old male with chronic congestive heart failure secondary to moderate MR and TR and LV non-compaction with dilated cardiomyopathy had episodes of bradycardia with adverse effect of antibiotics, warfarin and amiodarone. His condition and blood work improved by holding warfarin and amiodarone. Azithromycin was discontinued.  Patient follows heart failure care instructions. He was also advised to discontinue smoking. He will see Dr. Sharyn Lull in 1 week for recheck on INR and heart rate control for chronic atrial fibrillation..  Consults: cardiology  Significant Diagnostic Studies: labs: Near normal CBC, BMET and TSH. Minimally elevated troponin I from demand ischemia. INR-4.1, 4.4 and 3.4. MPN-361.8-chronically elevated.  EKG: Atrial fibrillation with high grade AV block  Echocardiogram: LV non-compaction with mild LV systolic dysfunction, mild AI and PI and moderate MR and TR.  Chest x-ray: Unremarkable.  Treatments: cardiac meds: furosemide, amiodarone, warfarin and losartan  Discharge Exam: Blood pressure (!) 93/56, pulse 61, temperature 97.9 F (36.6 C), temperature  source Oral, resp. rate 15, height 5\' 5"  (1.651 m), weight 50.9 kg, SpO2 99 %. General appearance: alert, cooperative and appears stated age. Head: Normocephalic, atraumatic. Eyes: Brown eyes, pink conjunctiva, corneas clear. PERRL, EOM's intact.  Neck: No adenopathy, no carotid bruit, no JVD, supple, symmetrical, trachea midline and thyroid not enlarged. Resp: Clear to auscultation bilaterally. Cardio: Regular rate and rhythm, S1, S2 normal, III/VI systolic and II/VI diastolic murmur, no click, rub or gallop. GI: Soft, non-tender; bowel sounds normal; no organomegaly. Extremities: No edema, cyanosis or clubbing. Skin: Warm and dry.  Neurologic: Alert and oriented X 3, normal strength and tone. Normal coordination and gait.  Disposition: Discharge disposition: 01-Home or Self Care        Allergies as of 10/28/2018      Reactions   Aspirin Other (See Comments)   Internal bleeding       Medication List    STOP taking these medications   azithromycin 250 MG tablet Commonly known as:  ZITHROMAX   docusate sodium 100 MG capsule Commonly known as:  COLACE   fluocinonide cream 0.05 % Commonly known as:  LIDEX     TAKE these medications   amiodarone 200 MG tablet Commonly known as:  PACERONE Take 0.5 tablets (100 mg total) by mouth daily.   amoxicillin-clavulanate 875-125 MG tablet Commonly known as:  AUGMENTIN Take 1 tablet by mouth every 12 (twelve) hours for 7 days.   furosemide 20 MG tablet Commonly known as:  LASIX Take 1 tablet (20 mg total) by mouth daily.   losartan 25 MG tablet Commonly known as:  COZAAR Take 1 tablet (25 mg total) by mouth daily.   warfarin 5 MG tablet Commonly known as:  COUMADIN Take 1 tablet (5 mg total) by mouth daily at 6 PM. What changed:  when to take this      Follow-up Information    Rinaldo Cloud, MD. Schedule an appointment as soon as possible for a visit in 1 week(s).   Specialty:  Cardiology Contact information: [address] [place] [address] [phone]           Signed: Ricki Rodriguez 10/28/2018, 1:11 PM

## 2018-11-03 ENCOUNTER — Ambulatory Visit (HOSPITAL_COMMUNITY)
Admission: EM | Admit: 2018-11-03 | Discharge: 2018-11-03 | Disposition: A | Discharge status: Home / Self Care | Payer: Medicaid Other | Attending: Family Medicine | Admitting: Family Medicine

## 2018-11-03 ENCOUNTER — Ambulatory Visit (INDEPENDENT_AMBULATORY_CARE_PROVIDER_SITE_OTHER): Payer: Medicaid Other

## 2018-11-03 ENCOUNTER — Other Ambulatory Visit: Payer: Self-pay

## 2018-11-03 DIAGNOSIS — I509 Heart failure, unspecified: Secondary | ICD-10-CM

## 2018-11-03 NOTE — ED Triage Notes (Addendum)
Per pt he said he is having sob when he is laying down and when he is working sometimes. Pt is also saying he is not able to sleep at night bc of SOB. No chest pain

## 2018-11-15 ENCOUNTER — Emergency Department (HOSPITAL_COMMUNITY): Payer: Medicaid Other

## 2018-11-15 ENCOUNTER — Other Ambulatory Visit: Payer: Self-pay

## 2018-11-15 ENCOUNTER — Encounter (HOSPITAL_COMMUNITY): Payer: Self-pay | Admitting: Emergency Medicine

## 2018-11-15 ENCOUNTER — Emergency Department (HOSPITAL_COMMUNITY)
Admission: EM | Admit: 2018-11-15 | Discharge: 2018-11-15 | Disposition: A | Discharge status: Home / Self Care | Payer: Medicaid Other | Attending: Emergency Medicine | Admitting: Emergency Medicine

## 2018-11-15 DIAGNOSIS — R0789 Other chest pain: Secondary | ICD-10-CM | POA: Insufficient documentation

## 2018-11-15 DIAGNOSIS — I509 Heart failure, unspecified: Secondary | ICD-10-CM | POA: Diagnosis not present

## 2018-11-15 DIAGNOSIS — Z87891 Personal history of nicotine dependence: Secondary | ICD-10-CM | POA: Diagnosis not present

## 2018-11-15 DIAGNOSIS — R002 Palpitations: Secondary | ICD-10-CM | POA: Diagnosis present

## 2018-11-15 DIAGNOSIS — I4891 Unspecified atrial fibrillation: Secondary | ICD-10-CM | POA: Diagnosis not present

## 2018-11-15 DIAGNOSIS — Z79899 Other long term (current) drug therapy: Secondary | ICD-10-CM | POA: Diagnosis not present

## 2018-11-15 LAB — CBC
HCT: 38.2 % — ABNORMAL LOW (ref 39.0–52.0)
Hemoglobin: 13.1 g/dL (ref 13.0–17.0)
MCH: 31.5 pg (ref 26.0–34.0)
MCHC: 34.3 g/dL (ref 30.0–36.0)
MCV: 91.8 fL (ref 80.0–100.0)
Platelets: 122 10*3/uL — ABNORMAL LOW (ref 150–400)
RBC: 4.16 MIL/uL — ABNORMAL LOW (ref 4.22–5.81)
RDW: 11.8 % (ref 11.5–15.5)
WBC: 7.7 10*3/uL (ref 4.0–10.5)
nRBC: 0 % (ref 0.0–0.2)

## 2018-11-15 LAB — BASIC METABOLIC PANEL
Anion gap: 9 (ref 5–15)
BUN: 20 mg/dL (ref 6–20)
CO2: 23 mmol/L (ref 22–32)
Calcium: 8.7 mg/dL — ABNORMAL LOW (ref 8.9–10.3)
Chloride: 103 mmol/L (ref 98–111)
Creatinine, Ser: 1.21 mg/dL (ref 0.61–1.24)
GFR calc Af Amer: >60 mL/min (ref >60)
GFR calc non Af Amer: >60 mL/min (ref >60)
Glucose, Bld: 105 mg/dL — ABNORMAL HIGH (ref 70–99)
Potassium: 4.3 mmol/L (ref 3.5–5.1)
Sodium: 135 mmol/L (ref 135–145)

## 2018-11-15 LAB — TROPONIN I
Troponin I: 0.05 ng/mL (ref <0.03)
Troponin I: 0.05 ng/mL (ref <0.03)

## 2018-11-15 NOTE — ED Provider Notes (Signed)
[facility] EMERGENCY DEPARTMENT Provider Note   CSN: [id number] Arrival date & time: 11/15/18  1224    History   Chief Complaint Chief Complaint  Patient presents with  . Palpitations  . Chest Pain    HPI Gary [name] is a 43 y.o. male.     The history is provided by the patient.  Palpitations  Palpitations quality:  Irregular Onset quality:  Sudden Timing:  Intermittent Progression:  Resolved Chronicity:  Recurrent Context: not anxiety and not stimulant use   Relieved by:  Nothing Worsened by:  Nothing Associated symptoms: chest pain   Associated symptoms: no back pain, no chest pressure, no cough, no diaphoresis, no hemoptysis, no leg pain, no lower extremity edema, no near-syncope, no numbness, no shortness of breath, no syncope and no vomiting   Risk factors: hx of atrial fibrillation     Past Medical History:  Diagnosis Date  . Atrial fibrillation (HCC)   . CHF (congestive heart failure) (HCC)   . Dysrhythmia    hx. of  A-fib  . Internal bleeding    DUE TO ASPIRIN, OR MEDS  . Murmur   . Smoker     Patient Active Problem List   Diagnosis Date Noted  . Chronic atrial fibrillation 10/27/2018  . Protein-calorie malnutrition, severe 10/17/2017  . Incarcerated inguinal hernia 10/13/2017  . Acute congestive heart failure (HCC) 08/14/2017    Past Surgical History:  Procedure Laterality Date  . ABDOMINAL SURGERY    . ABDOMINAL SURGERY     under 10 yrs. old or younger  . CARDIAC SURGERY    . CARDIOVERSION N/A 08/17/2017   Procedure: CARDIOVERSION;  Surgeon: Orpah Cobb, MD;  Location: [facility] ENDOSCOPY;  Service: Cardiovascular;  Laterality: N/A;  . HERNIA REPAIR    . INGUINAL HERNIA REPAIR Right 10/13/2017   Procedure: OPEN RIGHT INGUINAL HERNIA REPAIR ERAS PATHWAY;  Surgeon: Jimmye Norman, MD;  Location: [facility] OR;  Service: General;  Laterality: Right;  . OPEN HEART SURGERY     AS A CHILD , pt. thinks he was under 10 yrs. old. done in  [place]  . TEE WITHOUT CARDIOVERSION N/A 08/17/2017   Procedure: TRANSESOPHAGEAL ECHOCARDIOGRAM (TEE);  Surgeon: Orpah Cobb, MD;  Location: [facility] ENDOSCOPY;  Service: Cardiovascular;  Laterality: N/A;        Home Medications    Prior to Admission medications   Medication Sig Start Date End Date Taking? Authorizing Provider  amiodarone (PACERONE) 200 MG tablet Take 0.5 tablets (100 mg total) by mouth daily. 10/28/18  Yes Orpah Cobb, MD  furosemide (LASIX) 20 MG tablet Take 1 tablet (20 mg total) by mouth daily. 09/21/18  Yes Dartha Lodge, PA-C  losartan (COZAAR) 25 MG tablet Take 1 tablet (25 mg total) by mouth daily. Patient taking differently: Take 12.5 mg by mouth daily.  08/22/17  Yes Rinaldo Cloud, MD  warfarin (COUMADIN) 5 MG tablet Take 1 tablet (5 mg total) by mouth daily at 6 PM. 10/28/18  Yes Orpah Cobb, MD    Family History No family history on file.  Social History Social History   Tobacco Use  . Smoking status: Former Smoker    Packs/day: 1.00    Years: 20.00    Pack years: 20.00    Types: Cigarettes  . Smokeless tobacco: Never Used  Substance Use Topics  . Alcohol use: Not Currently    Comment: social  . Drug use: Not Currently    Types: Marijuana     Allergies  Aspirin   Review of Systems Review of Systems  Constitutional: Negative for chills, diaphoresis and fever.  HENT: Negative for ear pain and sore throat.   Eyes: Negative for pain and visual disturbance.  Respiratory: Negative for cough, hemoptysis and shortness of breath.   Cardiovascular: Positive for chest pain and palpitations. Negative for syncope and near-syncope.  Gastrointestinal: Negative for abdominal pain and vomiting.  Genitourinary: Negative for dysuria and hematuria.  Musculoskeletal: Negative for arthralgias and back pain.  Skin: Negative for color change and rash.  Neurological: Negative for seizures, syncope and numbness.  All other systems reviewed and are negative.     Physical Exam Updated Vital Signs BP 98/62 (BP Location: Right Arm)   Pulse 64   Temp 98.3 F (36.8 C) (Oral)   Resp 18   Ht 5\' 5"  (1.651 m)   Wt 52.2 kg   SpO2 100%   BMI 19.14 kg/m   Physical Exam Vitals signs and nursing note reviewed.  Constitutional:      Appearance: He is well-developed.  HENT:     Head: Normocephalic and atraumatic.  Eyes:     Extraocular Movements: Extraocular movements intact.     Conjunctiva/sclera: Conjunctivae normal.     Pupils: Pupils are equal, round, and reactive to light.  Neck:     Musculoskeletal: Normal range of motion and neck supple.  Cardiovascular:     Rate and Rhythm: Normal rate. Rhythm irregular.     Pulses:          Radial pulses are 2+ on the right side and 2+ on the left side.     Heart sounds: Normal heart sounds. No murmur.  Pulmonary:     Effort: Pulmonary effort is normal. No respiratory distress.     Breath sounds: Normal breath sounds. No decreased breath sounds, wheezing, rhonchi or rales.  Abdominal:     Palpations: Abdomen is soft.     Tenderness: There is no abdominal tenderness.  Musculoskeletal:     Right lower leg: No edema.     Left lower leg: No edema.  Skin:    General: Skin is warm and dry.     Capillary Refill: Capillary refill takes less than 2 seconds.  Neurological:     General: No focal deficit present.     Mental Status: He is alert and oriented to person, place, and time.     Cranial Nerves: No cranial nerve deficit.     Motor: No weakness.  Psychiatric:        Mood and Affect: Mood normal.      ED Treatments / Results  Labs (all labs ordered are listed, but only abnormal results are displayed) Labs Reviewed  BASIC METABOLIC PANEL - Abnormal; Notable for the following components:      Result Value   Glucose, Bld 105 (*)    Calcium 8.7 (*)    All other components within normal limits  CBC - Abnormal; Notable for the following components:   RBC 4.16 (*)    HCT 38.2 (*)     Platelets 122 (*)    All other components within normal limits  TROPONIN I - Abnormal; Notable for the following components:   Troponin I 0.05 (*)    All other components within normal limits  TROPONIN I - Abnormal; Notable for the following components:   Troponin I 0.05 (*)    All other components within normal limits    EKG EKG Interpretation  Date/Time:  Thursday Nov 15 2018 12:36:11 EDT Ventricular Rate:  65 PR Interval:    QRS Duration: 110 QT Interval:  450 QTC Calculation: 468 R Axis:   89 Text Interpretation:  Atrial fibrillation Left ventricular hypertrophy ST & T wave abnormality, consider inferolateral ischemia Prolonged QT Abnormal ECG Confirmed by Virgina Norfolk [phone]) on 11/15/2018 2:09:59 PM   Radiology Dg Chest 2 View  Result Date: 11/15/2018 CLINICAL DATA:  43 year old male with left-sided palpitation and drowsiness EXAM: CHEST - 2 VIEW COMPARISON:  11/03/2018, 10/27/2018, CT 10/25/2018 FINDINGS: Cardiomediastinal silhouette unchanged with prominence of the left heart border. Fullness in the central vasculature. No interlobular septal thickening. Similar appearance of coarsened interstitial markings of the bilateral lungs, more prominent on the left. No pleural effusion. No pneumothorax. No new confluent airspace disease. IMPRESSION: Similar appearance of chronic lung changes without evidence of superimposed acute cardiopulmonary disease. Unchanged appearance of the cardiomediastinal silhouette, with cardiomegaly Electronically Signed   By: Gilmer Mor D.O.   On: 11/15/2018 13:23    Procedures Procedures (including critical care time)  Medications Ordered in ED Medications - No data to display   Initial Impression / Assessment and Plan / ED Course  I have reviewed the triage vital signs and the nursing notes.  Pertinent labs & imaging results that were available during my care of the patient were reviewed by me and considered in my medical decision making (see  chart for details).     Gary [name] is a 43 year old male with history of atrial fibrillation on blood thinner, heart failure who presents to the ED with palpitations.  Patient with unremarkable vitals.  No fever.  EKG shows atrial fibrillation that looks rate controlled.  No ischemic changes.  Patient overall asymptomatic now.  He states that while he was at work he thought his heart rate was going very fast.  States that he has some chest discomfort.  All symptoms have resolved.  He is struggled with his atrial fibrillation recently and follows with Dr. Sharyn Lull.  He is now on amiodarone.  Patient had lab work done prior to my evaluation that showed no significant findings on chest x-ray.  No pneumothorax, no pleural effusion.  Electrolytes unremarkable.  Normal kidney function.  No leukocytosis.  Troponin elevated at 0.05.  This appears baseline for the patient.  Talked with Dr. Sharyn Lull on the phone and he states that if repeat troponin is stable he is okay to follow-up outpatient.  Patient asymptomatic now.  Telemetry is reassuring.  Troponin stable at 0.05.  Patient asymptomatic throughout my care.  No episodes of severe bradycardia or tachycardia.  He will follow-up with Dr. Sharyn Lull next week.  Discharged from ED in good condition.  This chart was dictated using voice recognition software.  Despite best efforts to proofread,  errors can occur which can change the documentation meaning.    Final Clinical Impressions(s) / ED Diagnoses   Final diagnoses:  Palpitations    ED Discharge Orders    None       Virgina Norfolk, DO 11/15/18 1640

## 2018-11-15 NOTE — ED Triage Notes (Signed)
Onset last night after eating developed chest pain and palpitations. Pain continued today currently 0/10. History of A-fib.

## 2018-11-15 NOTE — Discharge Instructions (Addendum)
Follow-up with Dr. Bernardo Heater next week.  Please call for an appointment.  Return to the ED if your symptoms worsen.

## 2018-11-15 NOTE — ED Notes (Signed)
Patient verbalizes understanding of discharge instructions. Opportunity for questioning and answers were provided. Armband removed by staff, pt discharged from ED ambulatory and by self.

## 2018-11-15 NOTE — ED Notes (Signed)
Lab called critical value troponin 0.05 reported to Dr Jacqulyn Bath.

## 2018-11-17 NOTE — ED Provider Notes (Signed)
[facility] CARE CENTER   [id number] 11/03/18 Arrival Time: 1426  ASSESSMENT & PLAN:  1. Acute on chronic congestive heart failure, unspecified heart failure type (HCC)    BP consistent with past visits. Recheck pulse 62 and irregularly irregular. H/O A. Fib. Reports that his motivating factor for coming in today was to be evaluated for COVID-19. No suspicion for infection; reassured.  Symptoms of SOB are improving since beginning. No indications for hospital admission at this time. Continue current medications. Close observation. Prefers to hold off on prednisone.  ECG: Performed today and interpreted by me: normal EKG, normal sinus rhythm, atrial fibrillation, rate approx 60.  ED if acute worsening.  Reviewed expectations re: course of current medical issues. Questions answered. Outlined signs and symptoms indicating need for more acute intervention. Patient verbalized understanding. After Visit Summary given.   SUBJECTIVE:  History from: patient. Gary [name] is a 43 y.o. male who presents with complaint of feeling SOB; intermittently; mainly when supine. Sitting propped up improves feeling. No PND. Gradually noticed this feeling over the past few days; improving. Questions wheezing at times. Using medications as prescribed. Afebrile. Ambulatory without difficulty. No LE edema reported. No associated CP/n/v. Normal PO intake. No abdominal or back pain. Feels symptoms more prominent at night. No recent illnesses. Not coughing. No palpitations. H/O chronic A. Fib.  Social History   Tobacco Use  Smoking Status Former Smoker  . Packs/day: 1.00  . Years: 20.00  . Pack years: 20.00  . Types: Cigarettes  Smokeless Tobacco Never Used   No OTC tx reported. Illicit drug use: none.  ROS: As per HPI. All other systems negative.   OBJECTIVE:  Vitals:   11/03/18 1445  BP: (!) 98/51  Pulse: (!) 57  Resp: 16  Temp: 98.1 F (36.7 C)  TempSrc: Oral  SpO2: 100%    General  appearance: alert, oriented, no acute distress Eyes: PERRLA; EOMI; conjunctivae normal HENT: normocephalic; atraumatic Neck: supple with FROM Lungs: without labored respirations; CTAB without active wheezing Heart: irregularly irregular rhythm Chest Wall: without tenderness to palpation Abdomen: soft, non-tender; bowel sounds normal; no masses or organomegaly; no guarding or rebound tenderness Extremities: without edema; without calf swelling or tenderness; symmetrical without gross deformities Skin: warm and dry; without rash or lesions Psychological: alert and cooperative; normal mood and affect   Allergies  Allergen Reactions  . Aspirin Other (See Comments)    Internal bleeding     Past Medical History:  Diagnosis Date  . Atrial fibrillation (HCC)   . CHF (congestive heart failure) (HCC)   . Dysrhythmia    hx. of  A-fib  . Internal bleeding    DUE TO ASPIRIN, OR MEDS  . Murmur   . Smoker    Social History   Socioeconomic History  . Marital status: Single    Spouse name: Not on file  . Number of children: Not on file  . Years of education: Not on file  . Highest education level: Not on file  Occupational History  . Not on file  Social Needs  . Financial resource strain: Not on file  . Food insecurity:    Worry: Not on file    Inability: Not on file  . Transportation needs:    Medical: Not on file    Non-medical: Not on file  Tobacco Use  . Smoking status: Former Smoker    Packs/day: 1.00    Years: 20.00    Pack years: 20.00    Types: Cigarettes  .  Smokeless tobacco: Never Used  Substance and Sexual Activity  . Alcohol use: Not Currently    Comment: social  . Drug use: Not Currently    Types: Marijuana  . Sexual activity: Not on file  Lifestyle  . Physical activity:    Days per week: Not on file    Minutes per session: Not on file  . Stress: Not on file  Relationships  . Social connections:    Talks on phone: Not on file    Gets together: Not on  file    Attends religious service: Not on file    Active member of club or organization: Not on file    Attends meetings of clubs or organizations: Not on file    Relationship status: Not on file  . Intimate partner violence:    Fear of current or ex partner: Not on file    Emotionally abused: Not on file    Physically abused: Not on file    Forced sexual activity: Not on file  Other Topics Concern  . Not on file  Social History Narrative  . Not on file   No family history on file. Past Surgical History:  Procedure Laterality Date  . ABDOMINAL SURGERY    . ABDOMINAL SURGERY     under 10 yrs. old or younger  . CARDIAC SURGERY    . CARDIOVERSION N/A 08/17/2017   Procedure: CARDIOVERSION;  Surgeon: Orpah Cobb, MD;  Location: [facility] ENDOSCOPY;  Service: Cardiovascular;  Laterality: N/A;  . HERNIA REPAIR    . INGUINAL HERNIA REPAIR Right 10/13/2017   Procedure: OPEN RIGHT INGUINAL HERNIA REPAIR ERAS PATHWAY;  Surgeon: Jimmye Norman, MD;  Location: [facility] OR;  Service: General;  Laterality: Right;  . OPEN HEART SURGERY     AS A CHILD , pt. thinks he was under 10 yrs. old. done in [place]  . TEE WITHOUT CARDIOVERSION N/A 08/17/2017   Procedure: TRANSESOPHAGEAL ECHOCARDIOGRAM (TEE);  Surgeon: Orpah Cobb, MD;  Location: [facility] ENDOSCOPY;  Service: Cardiovascular;  Laterality: N/A;     Mardella Layman, MD 11/17/18 [phone]

## 2018-12-07 ENCOUNTER — Encounter (HOSPITAL_COMMUNITY): Payer: Self-pay

## 2018-12-07 ENCOUNTER — Other Ambulatory Visit: Payer: Self-pay

## 2018-12-07 ENCOUNTER — Ambulatory Visit (HOSPITAL_COMMUNITY)
Admission: EM | Admit: 2018-12-07 | Discharge: 2018-12-07 | Disposition: A | Discharge status: Home / Self Care | Payer: Medicaid Other | Attending: Family Medicine | Admitting: Family Medicine

## 2018-12-07 DIAGNOSIS — R202 Paresthesia of skin: Secondary | ICD-10-CM

## 2018-12-07 DIAGNOSIS — Z72820 Sleep deprivation: Secondary | ICD-10-CM

## 2018-12-07 DIAGNOSIS — F419 Anxiety disorder, unspecified: Secondary | ICD-10-CM

## 2018-12-07 MED ORDER — MELATONIN 2.5 MG PO CAPS: 2.5000 mg | ORAL_CAPSULE | Freq: Every evening | ORAL | 0 refills | Status: DC | PRN

## 2018-12-07 NOTE — Discharge Instructions (Signed)
We will try melatonin at night to help promote sleep (I changed my mind after we spoke about medicine choice) Please continue to follow with your primary care provider for continued management if symptoms persist.  If develop worsening of symptoms, weakness, dizziness, persistent numbness or tingling, confusion, headache, chest pain , nausea or otherwise worsening please go to the ER.

## 2018-12-07 NOTE — ED Triage Notes (Signed)
Patient presents to Urgent Care with complaints of tingling on his head and on his torso since about a week ago. Patient reports he has heart problems and wants to make sure he is not getting ready to have a stroke. Pt states he is not able to sleep at night.

## 2018-12-07 NOTE — ED Provider Notes (Signed)
[facility]    CSN: [id number] Arrival date & time: 12/07/18  1107     History   Chief Complaint Chief Complaint  Patient presents with  . Skin Problem    HPI Gary [name] is a 43 y.o. male.   Gary [name] presents with complaints of intermittent tingling sensations. He experiences this to his arms as well as his scalp and head. Has been intermittent for the past 1 week. No weakness or numbness. Occasionally feels like he starts to get a headache to the right side of his head but this dissipates. No current headache. No known rash or lesions. No injury. No head injury. No neck pain. No history of migraines. No nausea or diaphoresis. States that at night he has been getting very anxious with palpitations and electric "shock" pain to chest. He has had this worked up in the ER as well as has been seeing cardiology for this. Hx of CHF. No new edema. No new shortness of breath, this has improved as of late. Very poor sleep due to anxiety. On coumadin for his afib, INR checked today. Former smoker.     ROS per HPI, negative if not otherwise mentioned.      Past Medical History:  Diagnosis Date  . Atrial fibrillation (HCC)   . CHF (congestive heart failure) (HCC)   . Dysrhythmia    hx. of  A-fib  . Internal bleeding    DUE TO ASPIRIN, OR MEDS  . Murmur   . Smoker     Patient Active Problem List   Diagnosis Date Noted  . Chronic atrial fibrillation 10/27/2018  . Protein-calorie malnutrition, severe 10/17/2017  . Incarcerated inguinal hernia 10/13/2017  . Acute congestive heart failure (HCC) 08/14/2017    Past Surgical History:  Procedure Laterality Date  . ABDOMINAL SURGERY    . ABDOMINAL SURGERY     under 10 yrs. old or younger  . CARDIAC SURGERY    . CARDIOVERSION N/A 08/17/2017   Procedure: CARDIOVERSION;  Surgeon: Orpah Cobb, MD;  Location: [facility] ENDOSCOPY;  Service: Cardiovascular;  Laterality: N/A;  . HERNIA REPAIR    . INGUINAL HERNIA REPAIR  Right 10/13/2017   Procedure: OPEN RIGHT INGUINAL HERNIA REPAIR ERAS PATHWAY;  Surgeon: Jimmye Norman, MD;  Location: [facility] OR;  Service: General;  Laterality: Right;  . OPEN HEART SURGERY     AS A CHILD , pt. thinks he was under 9 yrs. old. done in [place]  . TEE WITHOUT CARDIOVERSION N/A 08/17/2017   Procedure: TRANSESOPHAGEAL ECHOCARDIOGRAM (TEE);  Surgeon: Orpah Cobb, MD;  Location: [facility] ENDOSCOPY;  Service: Cardiovascular;  Laterality: N/A;       Home Medications    Prior to Admission medications   Medication Sig Start Date End Date Taking? Authorizing Provider  amiodarone (PACERONE) 200 MG tablet Take 0.5 tablets (100 mg total) by mouth daily. 10/28/18   Orpah Cobb, MD  furosemide (LASIX) 20 MG tablet Take 1 tablet (20 mg total) by mouth daily. 09/21/18   Dartha Lodge, PA-C  losartan (COZAAR) 25 MG tablet Take 1 tablet (25 mg total) by mouth daily. Patient taking differently: Take 12.5 mg by mouth daily.  08/22/17   Rinaldo Cloud, MD  Melatonin 2.5 MG CAPS Take 1 capsule (2.5 mg total) by mouth at bedtime as needed. 12/07/18   Linus Mako B, NP  warfarin (COUMADIN) 5 MG tablet Take 1 tablet (5 mg total) by mouth daily at 6 PM. 10/28/18   Orpah Cobb, MD  Family History Family History  Problem Relation Age of Onset  . Healthy Father     Social History Social History   Tobacco Use  . Smoking status: Former Smoker    Packs/day: 1.00    Years: 20.00    Pack years: 20.00    Types: Cigarettes  . Smokeless tobacco: Never Used  Substance Use Topics  . Alcohol use: Not Currently    Comment: social  . Drug use: Not Currently    Types: Marijuana     Allergies   Aspirin   Review of Systems Review of Systems   Physical Exam Triage Vital Signs ED Triage Vitals  Enc Vitals Group     BP 12/07/18 1134 99/61     Pulse Rate 12/07/18 1134 72     Resp 12/07/18 1134 17     Temp 12/07/18 1134 97.8 F (36.6 C)     Temp Source 12/07/18 1134 Oral     SpO2 12/07/18 1134  100 %     Weight --      Height --      Head Circumference --      Peak Flow --      Pain Score 12/07/18 1133 0     Pain Loc --      Pain Edu? --      Excl. in GC? --    No data found.  Updated Vital Signs BP 99/61 (BP Location: Right Arm)   Pulse 72   Temp 97.8 F (36.6 C) (Oral)   Resp 17   SpO2 100%    Physical Exam Constitutional:      Appearance: He is well-developed.  HENT:     Head: Normocephalic and atraumatic.  Eyes:     Extraocular Movements: Extraocular movements intact.     Conjunctiva/sclera: Conjunctivae normal.     Pupils: Pupils are equal, round, and reactive to light.  Cardiovascular:     Rate and Rhythm: Normal rate. Rhythm irregular.  Pulmonary:     Effort: Pulmonary effort is normal.     Breath sounds: Normal breath sounds.  Musculoskeletal: Normal range of motion.  Skin:    General: Skin is warm and dry.  Neurological:     General: No focal deficit present.     Mental Status: He is alert and oriented to person, place, and time. Mental status is at baseline.     Cranial Nerves: No cranial nerve deficit.     Sensory: No sensory deficit.     Motor: No weakness.     Coordination: Coordination normal.     Gait: Gait normal.  Psychiatric:        Mood and Affect: Mood normal.        Behavior: Behavior normal.      UC Treatments / Results  Labs (all labs ordered are listed, but only abnormal results are displayed) Labs Reviewed - No data to display  EKG None  Radiology No results found.  Procedures Procedures (including critical care time)  Medications Ordered in UC Medications - No data to display  Initial Impression / Assessment and Plan / UC Course  I have reviewed the triage vital signs and the nursing notes.  Pertinent labs & imaging results that were available during my care of the patient were reviewed by me and considered in my medical decision making (see chart for details).     A&O. Non toxic in appearance. No red  flag findings, no neurological changes. Full ROM of all extremities. No  weakness. Discussed insomnia and anxiety. Continue to follow up with PCP and/or cardiology. Return precautions provided. Patient verbalized understanding and agreeable to plan.   Final Clinical Impressions(s) / UC Diagnoses   Final diagnoses:  Tingling sensation  Anxiety  Poor sleep     Discharge Instructions     We will try melatonin at night to help promote sleep (I changed my mind after we spoke about medicine choice) Please continue to follow with your primary care provider for continued management if symptoms persist.  If develop worsening of symptoms, weakness, dizziness, persistent numbness or tingling, confusion, headache, chest pain , nausea or otherwise worsening please go to the ER.    ED Prescriptions    Medication Sig Dispense Auth. Provider   Melatonin 2.5 MG CAPS Take 1 capsule (2.5 mg total) by mouth at bedtime as needed. 30 capsule Georgetta Haber, NP     Controlled Substance Prescriptions [place] Controlled Substance Registry consulted? Not Applicable   Georgetta Haber, NP 12/07/18 1322

## 2018-12-27 ENCOUNTER — Emergency Department (HOSPITAL_COMMUNITY): Payer: Medicaid Other

## 2018-12-27 ENCOUNTER — Encounter (HOSPITAL_COMMUNITY): Payer: Self-pay

## 2018-12-27 ENCOUNTER — Other Ambulatory Visit: Payer: Self-pay

## 2018-12-27 ENCOUNTER — Emergency Department (HOSPITAL_COMMUNITY)
Admission: EM | Admit: 2018-12-27 | Discharge: 2018-12-27 | Disposition: A | Discharge status: Home / Self Care | Payer: Medicaid Other | Attending: Emergency Medicine | Admitting: Emergency Medicine

## 2018-12-27 DIAGNOSIS — Z79899 Other long term (current) drug therapy: Secondary | ICD-10-CM | POA: Insufficient documentation

## 2018-12-27 DIAGNOSIS — R0602 Shortness of breath: Secondary | ICD-10-CM | POA: Diagnosis present

## 2018-12-27 DIAGNOSIS — Z7901 Long term (current) use of anticoagulants: Secondary | ICD-10-CM | POA: Diagnosis not present

## 2018-12-27 DIAGNOSIS — Z87891 Personal history of nicotine dependence: Secondary | ICD-10-CM | POA: Insufficient documentation

## 2018-12-27 DIAGNOSIS — I509 Heart failure, unspecified: Secondary | ICD-10-CM | POA: Diagnosis not present

## 2018-12-27 DIAGNOSIS — J189 Pneumonia, unspecified organism: Secondary | ICD-10-CM | POA: Diagnosis not present

## 2018-12-27 LAB — BASIC METABOLIC PANEL
Anion gap: 9 (ref 5–15)
BUN: 21 mg/dL — ABNORMAL HIGH (ref 6–20)
CO2: 25 mmol/L (ref 22–32)
Calcium: 8.7 mg/dL — ABNORMAL LOW (ref 8.9–10.3)
Chloride: 103 mmol/L (ref 98–111)
Creatinine, Ser: 1.02 mg/dL (ref 0.61–1.24)
GFR calc Af Amer: >60 mL/min (ref >60)
GFR calc non Af Amer: >60 mL/min (ref >60)
Glucose, Bld: 82 mg/dL (ref 70–99)
Potassium: 4.1 mmol/L (ref 3.5–5.1)
Sodium: 137 mmol/L (ref 135–145)

## 2018-12-27 LAB — CBC
HCT: 38.4 % — ABNORMAL LOW (ref 39.0–52.0)
Hemoglobin: 13.4 g/dL (ref 13.0–17.0)
MCH: 32.4 pg (ref 26.0–34.0)
MCHC: 34.9 g/dL (ref 30.0–36.0)
MCV: 93 fL (ref 80.0–100.0)
Platelets: 143 10*3/uL — ABNORMAL LOW (ref 150–400)
RBC: 4.13 MIL/uL — ABNORMAL LOW (ref 4.22–5.81)
RDW: 12 % (ref 11.5–15.5)
WBC: 11.8 10*3/uL — ABNORMAL HIGH (ref 4.0–10.5)
nRBC: 0 % (ref 0.0–0.2)

## 2018-12-27 LAB — TROPONIN I
Troponin I: 0.08 ng/mL (ref <0.03)
Troponin I: 0.1 ng/mL (ref <0.03)

## 2018-12-27 LAB — PROTIME-INR
INR: 3.1 — ABNORMAL HIGH (ref 0.8–1.2)
Prothrombin Time: 31.2 seconds — ABNORMAL HIGH (ref 11.4–15.2)

## 2018-12-27 MED ORDER — AMOXICILLIN-POT CLAVULANATE 875-125 MG PO TABS: 1.0000 | ORAL_TABLET | Freq: Two times a day (BID) | ORAL | 0 refills | Status: DC

## 2018-12-27 NOTE — ED Provider Notes (Signed)
[facility]-EMERGENCY DEPT Provider Note   CSN: [id number] Arrival date & time: 12/27/18  1130    History   Chief Complaint Chief Complaint  Patient presents with   Shortness of Breath    HPI Gary [name] is a 43 y.o. male.     HPI Patient presents with shortness of breath.  Began earlier today.  History of same.  States he was doing fine yesterday.  No fevers or chills.  Occasional cough.  States he was seen by his primary care doctor was doing well yesterday.  History of atrial fibrillation CHF.  No swelling in his legs.  States his weight was 118 yesterday which is about normal for him. Past Medical History:  Diagnosis Date   Atrial fibrillation (HCC)    CHF (congestive heart failure) (HCC)    Dysrhythmia    hx. of  A-fib   Internal bleeding    DUE TO ASPIRIN, OR MEDS   Murmur    Smoker     Patient Active Problem List   Diagnosis Date Noted   Chronic atrial fibrillation 10/27/2018   Protein-calorie malnutrition, severe 10/17/2017   Incarcerated inguinal hernia 10/13/2017   Acute congestive heart failure (HCC) 08/14/2017    Past Surgical History:  Procedure Laterality Date   ABDOMINAL SURGERY     ABDOMINAL SURGERY     under 10 yrs. old or younger   CARDIAC SURGERY     CARDIOVERSION N/A 08/17/2017   Procedure: CARDIOVERSION;  Surgeon: Orpah Cobb, MD;  Location: [facility] ENDOSCOPY;  Service: Cardiovascular;  Laterality: N/A;   HERNIA REPAIR     INGUINAL HERNIA REPAIR Right 10/13/2017   Procedure: OPEN RIGHT INGUINAL HERNIA REPAIR ERAS PATHWAY;  Surgeon: Jimmye Norman, MD;  Location: [facility] OR;  Service: General;  Laterality: Right;   OPEN HEART SURGERY     AS A CHILD , pt. thinks he was under 10 yrs. old. done in [facility]   TEE WITHOUT CARDIOVERSION N/A 08/17/2017   Procedure: TRANSESOPHAGEAL ECHOCARDIOGRAM (TEE);  Surgeon: Orpah Cobb, MD;  Location: [facility] ENDOSCOPY;  Service: Cardiovascular;  Laterality: N/A;        Home  Medications    Prior to Admission medications   Medication Sig Start Date End Date Taking? Authorizing Provider  amiodarone (PACERONE) 200 MG tablet Take 0.5 tablets (100 mg total) by mouth daily. 10/28/18   Orpah Cobb, MD  amoxicillin-clavulanate (AUGMENTIN) 875-125 MG tablet Take 1 tablet by mouth every 12 (twelve) hours. 12/27/18   Benjiman Core, MD  furosemide (LASIX) 20 MG tablet Take 1 tablet (20 mg total) by mouth daily. 09/21/18   Dartha Lodge, PA-C  losartan (COZAAR) 25 MG tablet Take 1 tablet (25 mg total) by mouth daily. Patient taking differently: Take 12.5 mg by mouth daily.  08/22/17   Rinaldo Cloud, MD  Melatonin 2.5 MG CAPS Take 1 capsule (2.5 mg total) by mouth at bedtime as needed. 12/07/18   Georgetta Haber, NP  warfarin (COUMADIN) 5 MG tablet Take 1 tablet (5 mg total) by mouth daily at 6 PM. 10/28/18   Orpah Cobb, MD    Family History Family History  Problem Relation Age of Onset   Healthy Father     Social History Social History   Tobacco Use   Smoking status: Former Smoker    Packs/day: 1.00    Years: 20.00    Pack years: 20.00    Types: Cigarettes   Smokeless tobacco: Never Used  Substance Use Topics   Alcohol  use: Not Currently    Comment: social   Drug use: Not Currently    Types: Marijuana     Allergies   Aspirin   Review of Systems Review of Systems  Constitutional: Negative for appetite change.  HENT: Negative for congestion.   Respiratory: Positive for shortness of breath.   Cardiovascular: Positive for chest pain. Negative for leg swelling.  Gastrointestinal: Negative for anal bleeding.  Genitourinary: Negative for frequency.  Musculoskeletal: Negative for back pain.  Skin: Negative for rash.  Neurological: Negative for weakness.  Psychiatric/Behavioral: Negative for confusion.     Physical Exam Updated Vital Signs BP 103/70    Pulse 63    Temp 98.2 F (36.8 C) (Oral)    Resp 19    Ht 5\' 5"  (1.651 m)    Wt 53.5 kg     SpO2 96%    BMI 19.64 kg/m   Physical Exam Vitals signs and nursing note reviewed.  HENT:     Head: Normocephalic.  Neck:     Musculoskeletal: Neck supple.     Vascular: No JVD.  Cardiovascular:     Rate and Rhythm: Regular rhythm.  Pulmonary:     Breath sounds: No decreased breath sounds, wheezing, rhonchi or rales.  Chest:     Chest wall: No tenderness.  Musculoskeletal:     Right lower leg: He exhibits no tenderness. No edema.     Left lower leg: He exhibits no tenderness. No edema.  Skin:    General: Skin is warm.     Capillary Refill: Capillary refill takes less than 2 seconds.  Neurological:     Mental Status: He is alert.      ED Treatments / Results  Labs (all labs ordered are listed, but only abnormal results are displayed) Labs Reviewed  TROPONIN I - Abnormal; Notable for the following components:      Result Value   Troponin I 0.08 (*)    All other components within normal limits  BASIC METABOLIC PANEL - Abnormal; Notable for the following components:   BUN 21 (*)    Calcium 8.7 (*)    All other components within normal limits  CBC - Abnormal; Notable for the following components:   WBC 11.8 (*)    RBC 4.13 (*)    HCT 38.4 (*)    Platelets 143 (*)    All other components within normal limits  PROTIME-INR - Abnormal; Notable for the following components:   Prothrombin Time 31.2 (*)    INR 3.1 (*)    All other components within normal limits  TROPONIN I - Abnormal; Notable for the following components:   Troponin I 0.10 (*)    All other components within normal limits    EKG EKG Interpretation  Date/Time:  Thursday December 27 2018 11:47:54 EDT Ventricular Rate:  75 PR Interval:    QRS Duration: 111 QT Interval:  431 QTC Calculation: 482 R Axis:   49 Text Interpretation:  Atrial fibrillation Abnormal R-wave progression, late transition LVH with IVCD and secondary repol abnrm Borderline prolonged QT interval No significant change since last  tracing Confirmed by Davonna Belling [phone]) on 12/27/2018 1:12:18 PM   Radiology Dg Chest 2 View  Result Date: 12/27/2018 CLINICAL DATA:  Shortness of breath and intermittent chest pain. Atrial fibrillation. EXAM: CHEST - 2 VIEW COMPARISON:  Nov 15, 2018 FINDINGS: Cardiomegaly is identified. Mild patchy opacity in the right base and lingula or not seen previously. The hila and mediastinum are unremarkable.  No other acute abnormalities. IMPRESSION: Mild patchy opacity in the right base and lingula may represent pneumonia or aspiration. Recommend follow-up to resolution. Electronically Signed   By: Gerome Samavid  Williams III M.D   On: 12/27/2018 14:02    Procedures Procedures (including critical care time)  Medications Ordered in ED Medications - No data to display   Initial Impression / Assessment and Plan / ED Course  I have reviewed the triage vital signs and the nursing notes.  Pertinent labs & imaging results that were available during my care of the patient were reviewed by me and considered in my medical decision making (see chart for details).        Patient presented with shortness of breath.  Multiple visits to ER.  Patient had an x-ray that showed possible pneumonia.  Has had occasional cough without much sputum production.  No fevers.  White count mildly elevated.  Has had pneumonia in this area previously.  Troponin mildly elevated but appears to be at his baseline.  Doubt this is cardiac cause of the elevated troponin.  Will discharge home with antibiotics.  Return for worsening symptoms.  COVID infection felt less likely  Final Clinical Impressions(s) / ED Diagnoses   Final diagnoses:  Pneumonia due to infectious organism, unspecified laterality, unspecified part of lung    ED Discharge Orders         Ordered    amoxicillin-clavulanate (AUGMENTIN) 875-125 MG tablet  Every 12 hours     12/27/18 1834           Benjiman CorePickering, Wealthy Danielski, MD 12/27/18 1908

## 2018-12-27 NOTE — ED Triage Notes (Signed)
Patient c/o SOB and intermittent chest pain. Patient reports that he has a history of atrial fib.

## 2019-01-11 ENCOUNTER — Emergency Department (HOSPITAL_COMMUNITY): Payer: Medicaid Other

## 2019-01-11 ENCOUNTER — Other Ambulatory Visit: Payer: Self-pay

## 2019-01-11 ENCOUNTER — Encounter (HOSPITAL_COMMUNITY): Payer: Self-pay | Admitting: Emergency Medicine

## 2019-01-11 ENCOUNTER — Inpatient Hospital Stay (HOSPITAL_COMMUNITY)
Admission: EM | Admit: 2019-01-11 | Discharge: 2019-01-13 | DRG: 291 | Disposition: A | Discharge status: Home / Self Care | Payer: Medicaid Other | Attending: Internal Medicine | Admitting: Internal Medicine

## 2019-01-11 DIAGNOSIS — Z8701 Personal history of pneumonia (recurrent): Secondary | ICD-10-CM

## 2019-01-11 DIAGNOSIS — R739 Hyperglycemia, unspecified: Secondary | ICD-10-CM

## 2019-01-11 DIAGNOSIS — I5043 Acute on chronic combined systolic (congestive) and diastolic (congestive) heart failure: Secondary | ICD-10-CM | POA: Diagnosis not present

## 2019-01-11 DIAGNOSIS — Z79899 Other long term (current) drug therapy: Secondary | ICD-10-CM | POA: Diagnosis not present

## 2019-01-11 DIAGNOSIS — I428 Other cardiomyopathies: Secondary | ICD-10-CM | POA: Diagnosis present

## 2019-01-11 DIAGNOSIS — I110 Hypertensive heart disease with heart failure: Secondary | ICD-10-CM | POA: Diagnosis present

## 2019-01-11 DIAGNOSIS — Z79890 Hormone replacement therapy (postmenopausal): Secondary | ICD-10-CM | POA: Diagnosis not present

## 2019-01-11 DIAGNOSIS — I482 Chronic atrial fibrillation, unspecified: Secondary | ICD-10-CM | POA: Diagnosis present

## 2019-01-11 DIAGNOSIS — Z91128 Patient's intentional underdosing of medication regimen for other reason: Secondary | ICD-10-CM | POA: Diagnosis not present

## 2019-01-11 DIAGNOSIS — Y92009 Unspecified place in unspecified non-institutional (private) residence as the place of occurrence of the external cause: Secondary | ICD-10-CM | POA: Diagnosis not present

## 2019-01-11 DIAGNOSIS — R001 Bradycardia, unspecified: Secondary | ICD-10-CM | POA: Diagnosis present

## 2019-01-11 DIAGNOSIS — Z87891 Personal history of nicotine dependence: Secondary | ICD-10-CM | POA: Insufficient documentation

## 2019-01-11 DIAGNOSIS — Z20828 Contact with and (suspected) exposure to other viral communicable diseases: Secondary | ICD-10-CM | POA: Diagnosis present

## 2019-01-11 DIAGNOSIS — Z888 Allergy status to other drugs, medicaments and biological substances status: Secondary | ICD-10-CM | POA: Diagnosis not present

## 2019-01-11 DIAGNOSIS — J189 Pneumonia, unspecified organism: Secondary | ICD-10-CM | POA: Diagnosis present

## 2019-01-11 DIAGNOSIS — T462X6A Underdosing of other antidysrhythmic drugs, initial encounter: Secondary | ICD-10-CM | POA: Diagnosis present

## 2019-01-11 DIAGNOSIS — Z9114 Patient's other noncompliance with medication regimen: Secondary | ICD-10-CM | POA: Diagnosis not present

## 2019-01-11 DIAGNOSIS — R7303 Prediabetes: Secondary | ICD-10-CM | POA: Diagnosis present

## 2019-01-11 DIAGNOSIS — Z7901 Long term (current) use of anticoagulants: Secondary | ICD-10-CM | POA: Diagnosis not present

## 2019-01-11 HISTORY — DX: Pneumonia, unspecified organism: J18.9

## 2019-01-11 HISTORY — DX: Acute on chronic combined systolic (congestive) and diastolic (congestive) heart failure: I50.43

## 2019-01-11 HISTORY — DX: Personal history of nicotine dependence: Z87.891

## 2019-01-11 HISTORY — DX: Hyperglycemia, unspecified: R73.9

## 2019-01-11 LAB — CBC WITH DIFFERENTIAL/PLATELET
Abs Immature Granulocytes: 0.02 10*3/uL (ref 0.00–0.07)
Basophils Absolute: 0.1 10*3/uL (ref 0.0–0.1)
Basophils Relative: 1 %
Eosinophils Absolute: 3.9 10*3/uL — ABNORMAL HIGH (ref 0.0–0.5)
Eosinophils Relative: 30 %
HCT: 38 % — ABNORMAL LOW (ref 39.0–52.0)
Hemoglobin: 13.4 g/dL (ref 13.0–17.0)
Immature Granulocytes: 0 %
Lymphocytes Relative: 9 %
Lymphs Abs: 1.2 10*3/uL (ref 0.7–4.0)
MCH: 32.1 pg (ref 26.0–34.0)
MCHC: 35.3 g/dL (ref 30.0–36.0)
MCV: 90.9 fL (ref 80.0–100.0)
Monocytes Absolute: 1 10*3/uL (ref 0.1–1.0)
Monocytes Relative: 8 %
Neutro Abs: 6.9 10*3/uL (ref 1.7–7.7)
Neutrophils Relative %: 52 %
Platelets: 160 10*3/uL (ref 150–400)
RBC: 4.18 MIL/uL — ABNORMAL LOW (ref 4.22–5.81)
RDW: 11.9 % (ref 11.5–15.5)
WBC: 13.1 10*3/uL — ABNORMAL HIGH (ref 4.0–10.5)
nRBC: 0 % (ref 0.0–0.2)

## 2019-01-11 LAB — RESPIRATORY PANEL BY PCR

## 2019-01-11 LAB — BASIC METABOLIC PANEL
Anion gap: 10 (ref 5–15)
BUN: 18 mg/dL (ref 6–20)
CO2: 21 mmol/L — ABNORMAL LOW (ref 22–32)
Calcium: 9 mg/dL (ref 8.9–10.3)
Chloride: 105 mmol/L (ref 98–111)
Creatinine, Ser: 1.1 mg/dL (ref 0.61–1.24)
GFR calc Af Amer: >60 mL/min (ref >60)
GFR calc non Af Amer: >60 mL/min (ref >60)
Glucose, Bld: 145 mg/dL — ABNORMAL HIGH (ref 70–99)
Potassium: 4.1 mmol/L (ref 3.5–5.1)
Sodium: 136 mmol/L (ref 135–145)

## 2019-01-11 LAB — PROTIME-INR
INR: 3 — ABNORMAL HIGH (ref 0.8–1.2)
Prothrombin Time: 30.9 seconds — ABNORMAL HIGH (ref 11.4–15.2)

## 2019-01-11 LAB — TROPONIN I (HIGH SENSITIVITY)
Troponin I (High Sensitivity): 83 ng/L — ABNORMAL HIGH (ref <18)
Troponin I (High Sensitivity): 80 ng/L — ABNORMAL HIGH (ref <18)

## 2019-01-11 LAB — SARS CORONAVIRUS 2 BY RT PCR (HOSPITAL ORDER, PERFORMED IN ~~LOC~~ HOSPITAL LAB): SARS Coronavirus 2: NEGATIVE

## 2019-01-11 LAB — BRAIN NATRIURETIC PEPTIDE: B Natriuretic Peptide: 1038 pg/mL — ABNORMAL HIGH (ref 0.0–100.0)

## 2019-01-11 MED ORDER — MELATONIN 2.5 MG PO CAPS: 2.5000 mg | ORAL_CAPSULE | Freq: Every evening | ORAL | Status: DC | PRN

## 2019-01-11 MED ORDER — ONDANSETRON HCL 4 MG/2ML IJ SOLN: 4.0000 mg | Freq: Four times a day (QID) | INTRAMUSCULAR | Status: DC | PRN

## 2019-01-11 MED ORDER — SODIUM CHLORIDE 0.9% FLUSH
3.0000 mL | Freq: Two times a day (BID) | INTRAVENOUS | Status: DC
  Administered 2019-01-11 – 2019-01-13 (×4): 3 mL via INTRAVENOUS

## 2019-01-11 MED ORDER — MELATONIN 3 MG PO TABS
3.0000 mg | ORAL_TABLET | Freq: Every evening | ORAL | Status: DC | PRN
  Filled 2019-01-11: qty 1

## 2019-01-11 MED ORDER — LOSARTAN POTASSIUM 25 MG PO TABS
12.5000 mg | ORAL_TABLET | Freq: Every day | ORAL | Status: DC
  Administered 2019-01-11 – 2019-01-12 (×2): 12.5 mg via ORAL
  Filled 2019-01-11 (×2): qty 1

## 2019-01-11 MED ORDER — SODIUM CHLORIDE 0.9% FLUSH: 3.0000 mL | INTRAVENOUS | Status: DC | PRN

## 2019-01-11 MED ORDER — WARFARIN SODIUM 2 MG PO TABS
4.0000 mg | ORAL_TABLET | Freq: Once | ORAL | Status: AC
  Administered 2019-01-11: 4 mg via ORAL
  Filled 2019-01-11: qty 2

## 2019-01-11 MED ORDER — WARFARIN - PHARMACIST DOSING INPATIENT: Freq: Every day | Status: DC

## 2019-01-11 MED ORDER — VANCOMYCIN HCL IN DEXTROSE 1-5 GM/200ML-% IV SOLN
1000.0000 mg | Freq: Once | INTRAVENOUS | Status: AC
  Administered 2019-01-11: 1000 mg via INTRAVENOUS
  Filled 2019-01-11: qty 200

## 2019-01-11 MED ORDER — POTASSIUM CHLORIDE CRYS ER 20 MEQ PO TBCR
20.0000 meq | EXTENDED_RELEASE_TABLET | Freq: Two times a day (BID) | ORAL | Status: DC
  Administered 2019-01-11 – 2019-01-13 (×4): 20 meq via ORAL
  Filled 2019-01-11 (×4): qty 1

## 2019-01-11 MED ORDER — AZITHROMYCIN 250 MG PO TABS: 500.0000 mg | ORAL_TABLET | Freq: Every day | ORAL | Status: DC

## 2019-01-11 MED ORDER — ACETAMINOPHEN 325 MG PO TABS
650.0000 mg | ORAL_TABLET | ORAL | Status: DC | PRN
  Administered 2019-01-11 – 2019-01-12 (×2): 650 mg via ORAL
  Filled 2019-01-11 (×2): qty 2

## 2019-01-11 MED ORDER — WARFARIN SODIUM 5 MG PO TABS: 5.0000 mg | ORAL_TABLET | Freq: Every day | ORAL | Status: DC

## 2019-01-11 MED ORDER — FUROSEMIDE 10 MG/ML IJ SOLN
40.0000 mg | Freq: Two times a day (BID) | INTRAMUSCULAR | Status: DC
  Administered 2019-01-11 – 2019-01-13 (×4): 40 mg via INTRAVENOUS
  Filled 2019-01-11 (×4): qty 4

## 2019-01-11 MED ORDER — PIPERACILLIN-TAZOBACTAM 3.375 G IVPB 30 MIN
3.3750 g | Freq: Once | INTRAVENOUS | Status: AC
  Administered 2019-01-11: 3.375 g via INTRAVENOUS
  Filled 2019-01-11: qty 50

## 2019-01-11 MED ORDER — ADULT MULTIVITAMIN W/MINERALS CH
1.0000 | ORAL_TABLET | Freq: Every day | ORAL | Status: DC
  Administered 2019-01-11 – 2019-01-13 (×3): 1 via ORAL
  Filled 2019-01-11 (×3): qty 1

## 2019-01-11 MED ORDER — IOHEXOL 350 MG/ML SOLN
100.0000 mL | Freq: Once | INTRAVENOUS | Status: AC | PRN
  Administered 2019-01-11: 100 mL via INTRAVENOUS

## 2019-01-11 MED ORDER — SODIUM CHLORIDE 0.9 % IV SOLN
2.0000 g | INTRAVENOUS | Status: DC
  Administered 2019-01-11 – 2019-01-12 (×2): 2 g via INTRAVENOUS
  Filled 2019-01-11 (×2): qty 2
  Filled 2019-01-11: qty 20

## 2019-01-11 MED ORDER — ZOLPIDEM TARTRATE 5 MG PO TABS
5.0000 mg | ORAL_TABLET | Freq: Every evening | ORAL | Status: DC | PRN
  Filled 2019-01-11: qty 1

## 2019-01-11 MED ORDER — SODIUM CHLORIDE 0.9 % IV SOLN: 2.0000 g | INTRAVENOUS | Status: DC

## 2019-01-11 MED ORDER — AZITHROMYCIN 250 MG PO TABS
500.0000 mg | ORAL_TABLET | Freq: Every day | ORAL | Status: DC
  Administered 2019-01-11 – 2019-01-12 (×2): 500 mg via ORAL
  Filled 2019-01-11 (×2): qty 2

## 2019-01-11 MED ORDER — SODIUM CHLORIDE 0.9 % IV SOLN
250.0000 mL | INTRAVENOUS | Status: DC | PRN
  Administered 2019-01-11 – 2019-01-12 (×2): 250 mL via INTRAVENOUS

## 2019-01-11 NOTE — ED Notes (Signed)
ED TO INPATIENT HANDOFF REPORT  ED Nurse Name and Phone #: maggie [phone]  S Name/Age/Gender Gary [name] 43 y.o. male Room/Bed: 018C/018C  Code Status   Code Status: Prior  Home/SNF/Other Home Patient oriented to: self, place, time and situation Is this baseline? Yes   Triage Complete: Triage complete  Chief Complaint SOB  Triage Note Pt arrives with reports of SOB for multiple days. States he has had pneumonia recently and completed his abx. Hx of A-fib and believes the SOB is medication related.    Allergies Allergies  Allergen Reactions  . Aspirin Other (See Comments)    Internal bleeding     Level of Care/Admitting Diagnosis ED Disposition    ED Disposition Condition Comment   Admit  Hospital Area: [facility] [[id number]]  Level of Care: Telemetry Cardiac [103]  Covid Evaluation: Confirmed COVID Negative  Diagnosis: Acute on chronic combined systolic and diastolic CHF, NYHA class 3 [facility]) [[id number]]  Admitting Physician: Lahoma Crocker [[id number]]  Attending Physician: Lahoma Crocker [[id number]]  Estimated length of stay: 3 - 4 days  Certification:: I certify this patient will need inpatient services for at least 2 midnights  PT Class (Do Not Modify): Inpatient [101]  PT Acc Code (Do Not Modify): Private [1]       B Medical/Surgery History Past Medical History:  Diagnosis Date  . Atrial fibrillation (HCC)   . CHF (congestive heart failure) (HCC)   . Dysrhythmia    hx. of  A-fib  . Former smoker    Quit  . Internal bleeding    DUE TO ASPIRIN, OR MEDS  . Murmur    Past Surgical History:  Procedure Laterality Date  . ABDOMINAL SURGERY    . ABDOMINAL SURGERY     under 10 yrs. old or younger  . CARDIAC SURGERY    . CARDIOVERSION N/A 08/17/2017   Procedure: CARDIOVERSION;  Surgeon: Orpah Cobb, MD;  Location: [facility] ENDOSCOPY;  Service: Cardiovascular;  Laterality: N/A;  . HERNIA REPAIR    . INGUINAL HERNIA REPAIR Right 10/13/2017    Procedure: OPEN RIGHT INGUINAL HERNIA REPAIR ERAS PATHWAY;  Surgeon: Jimmye Norman, MD;  Location: [facility] OR;  Service: General;  Laterality: Right;  . OPEN HEART SURGERY     AS A CHILD , pt. thinks he was under 10 yrs. old. done in [place]  . TEE WITHOUT CARDIOVERSION N/A 08/17/2017   Procedure: TRANSESOPHAGEAL ECHOCARDIOGRAM (TEE);  Surgeon: Orpah Cobb, MD;  Location: [facility] ENDOSCOPY;  Service: Cardiovascular;  Laterality: N/A;     A IV Location/Drains/Wounds Patient Lines/Drains/Airways Status   Active Line/Drains/Airways    Name:   Placement date:   Placement time:   Site:   Days:   Peripheral IV 01/11/19 Right;Upper Forearm   01/11/19    0739    Forearm   less than 1   Incision (Closed) 10/13/17 Abdomen Other (Comment)   10/13/17    1747     455          Intake/Output Last 24 hours  Intake/Output Summary (Last 24 hours) at 01/11/2019 1241 Last data filed at 01/11/2019 1241 Gross per 24 hour  Intake 100 ml  Output -  Net 100 ml    Labs/Imaging Results for orders placed or performed during the hospital encounter of 01/11/19 (from the past 48 hour(s))  Protime-INR     Status: Abnormal   Collection Time: 01/11/19  7:35 AM  Result Value Ref Range   Prothrombin Time 30.9 (H) 11.4 -  15.2 seconds   INR 3.0 (H) 0.8 - 1.2    Comment: (NOTE) INR goal varies based on device and disease states. Performed at [facility], [place] [address], [place], [place] [address]   CBC with Differential     Status: Abnormal   Collection Time: 01/11/19  7:35 AM  Result Value Ref Range   WBC 13.1 (H) 4.0 - 10.5 K/uL   RBC 4.18 (L) 4.22 - 5.81 MIL/uL   Hemoglobin 13.4 13.0 - 17.0 g/dL   HCT 38.0 (L) 39.0 - 52.0 %   MCV 90.9 80.0 - 100.0 fL   MCH 32.1 26.0 - 34.0 pg   MCHC 35.3 30.0 - 36.0 g/dL   RDW 11.9 11.5 - 15.5 %   Platelets 160 150 - 400 K/uL   nRBC 0.0 0.0 - 0.2 %   Neutrophils Relative % 52 %   Neutro Abs 6.9 1.7 - 7.7 K/uL   Lymphocytes Relative 9 %   Lymphs Abs 1.2 0.7 - 4.0 K/uL    Monocytes Relative 8 %   Monocytes Absolute 1.0 0.1 - 1.0 K/uL   Eosinophils Relative 30 %   Eosinophils Absolute 3.9 (H) 0.0 - 0.5 K/uL   Basophils Relative 1 %   Basophils Absolute 0.1 0.0 - 0.1 K/uL   Immature Granulocytes 0 %   Abs Immature Granulocytes 0.02 0.00 - 0.07 K/uL    Comment: Performed at [facility] [address] [place], [place] [address]  Basic metabolic panel     Status: Abnormal   Collection Time: 01/11/19  7:35 AM  Result Value Ref Range   Sodium 136 135 - 145 mmol/L   Potassium 4.1 3.5 - 5.1 mmol/L   Chloride 105 98 - 111 mmol/L   CO2 21 (L) 22 - 32 mmol/L   Glucose, Bld 145 (H) 70 - 99 mg/dL   BUN 18 6 - 20 mg/dL   Creatinine, Ser 1.10 0.61 - 1.24 mg/dL   Calcium 9.0 8.9 - 10.3 mg/dL   GFR calc non Af Amer >60 >60 mL/min   GFR calc Af Amer >60 >60 mL/min   Anion gap 10 5 - 15    Comment: Performed at [facility], [place] [address] [place], [place] [address]  Brain natriuretic peptide     Status: Abnormal   Collection Time: 01/11/19  7:35 AM  Result Value Ref Range   B Natriuretic Peptide 1,038.0 (H) 0.0 - 100.0 pg/mL    Comment: Performed at [facility] [address] [place], [place] [address]  Troponin I (High Sensitivity)     Status: Abnormal   Collection Time: 01/11/19  7:35 AM  Result Value Ref Range   Troponin I (High Sensitivity) 83 (H) <18 ng/L    Comment: (NOTE) Elevated high sensitivity troponin I (hsTnI) values and significant  changes across serial measurements may suggest ACS but many other  chronic and acute conditions are known to elevate hsTnI results.  Refer to the "Links" section for chest pain algorithms and additional  guidance. Performed at [facility], [place] [address] [place], [place] [address]   SARS Coronavirus 2 (CEPHEID- Performed in [place] hospital lab), Hosp Order     Status: None   Collection Time: 01/11/19  9:12 AM   Specimen: Nasopharyngeal Swab  Result Value Ref Range   SARS  Coronavirus 2 NEGATIVE NEGATIVE    Comment: (NOTE) If result is NEGATIVE SARS-CoV-2 target nucleic acids are NOT DETECTED.  The SARS-CoV-2 RNA is generally detectable in upper and lower  respiratory specimens during the acute phase of infection. The lowest  concentration of SARS-CoV-2 viral copies this assay can detect is 250  copies / mL. A negative result does not preclude SARS-CoV-2 infection  and should not be used as the sole basis for treatment or other  patient management decisions.  A negative result may occur with  improper specimen collection / handling, submission of specimen other  than nasopharyngeal swab, presence of viral mutation(s) within the  areas targeted by this assay, and inadequate number of viral copies  (<250 copies / mL). A negative result must be combined with clinical  observations, patient history, and epidemiological information. If result is POSITIVE SARS-CoV-2 target nucleic acids are DETECTED. The SARS-CoV-2 RNA is generally detectable in upper and lower  respiratory specimens dur ing the acute phase of infection.  Positive  results are indicative of active infection with SARS-CoV-2.  Clinical  correlation with patient history and other diagnostic information is  necessary to determine patient infection status.  Positive results do  not rule out bacterial infection or co-infection with other viruses. If result is PRESUMPTIVE POSTIVE SARS-CoV-2 nucleic acids MAY BE PRESENT.   A presumptive positive result was obtained on the submitted specimen  and confirmed on repeat testing.  While 2019 novel coronavirus  (SARS-CoV-2) nucleic acids may be present in the submitted sample  additional confirmatory testing may be necessary for epidemiological  and / or clinical management purposes  to differentiate between  SARS-CoV-2 and other Sarbecovirus currently known to infect humans.  If clinically indicated additional testing with an alternate test  methodology  [phone]([id number]) is advised. The SARS-CoV-2 RNA is generally  detectable in upper and lower respiratory sp ecimens during the acute  phase of infection. The expected result is Negative. Fact Sheet for Patients:  BoilerBrush.com.cyhttps://www.fda.gov/media/136312/download Fact Sheet for Healthcare Providers: https://pope.com/https://www.fda.gov/media/136313/download This test is not yet approved or cleared by the [place] FDA and has been authorized for detection and/or diagnosis of SARS-CoV-2 by FDA under an Emergency Use Authorization (EUA).  This EUA will remain in effect (meaning this test can be used) for the duration of the COVID-19 declaration under Section 564(b)(1) of the Act, 21 U.S.C. section 360bbb-3(b)(1), unless the authorization is terminated or revoked sooner. Performed at [facility], [address] [place], [place] [address]   Troponin I (High Sensitivity)     Status: Abnormal   Collection Time: 01/11/19  9:23 AM  Result Value Ref Range   Troponin I (High Sensitivity) 80 (H) <18 ng/L    Comment: (NOTE) Elevated high sensitivity troponin I (hsTnI) values and significant  changes across serial measurements may suggest ACS but many other  chronic and acute conditions are known to elevate hsTnI results.  Refer to the "Links" section for chest pain algorithms and additional  guidance. Performed at [facility], [address] [place], [place] [address]    Dg Chest 2 View  Result Date: 01/11/2019 CLINICAL DATA:  Shortness of breath. EXAM: CHEST - 2 VIEW COMPARISON:  Chest x-ray dated December 27, 2018. FINDINGS: Stable moderate cardiomegaly. New pulmonary vascular congestion with increased interstitial markings peripherally at the lung bases. Increasing opacity in lingula and right lower lobe. No pleural effusion or pneumothorax. No acute osseous abnormality. IMPRESSION: 1. Worsening airspace disease in the lingula and right lower lobe, concerning for multifocal pneumonia. Followup PA and lateral  chest X-ray is recommended in 3-4 weeks following trial  of antibiotic therapy to ensure resolution. 2. Stable moderate cardiomegaly. New pulmonary vascular congestion and mild interstitial edema. Electronically Signed   By: Obie DredgeWilliam T Derry M.D.   On: 01/11/2019 08:21   Ct Angio Chest Pe W And/or Wo Contrast  Result Date: 01/11/2019 CLINICAL DATA:  Chest tightness.  Recent pneumonia. EXAM: CT ANGIOGRAPHY CHEST WITH CONTRAST TECHNIQUE: Multidetector CT imaging of the chest was performed using the standard protocol during bolus administration of intravenous contrast. Multiplanar CT image reconstructions and MIPs were obtained to evaluate the vascular anatomy. CONTRAST:  100mL OMNIPAQUE IOHEXOL 350 MG/ML SOLN COMPARISON:  CTA chest dated October 25, 2018. FINDINGS: Cardiovascular: Satisfactory opacification of the pulmonary arteries to the segmental level. No evidence of pulmonary embolism. 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 Stable cardiomegaly. No pericardial effusion. Prominent reflux of contrast into the intrahepatic IVC and hepatic veins, similar to prior study. No thoracic aortic aneurysm. Dilated left hemiazygous system again noted. Mediastinum/Nodes: New mildly enlarged right paratracheal, subcarinal, and hilar lymph nodes, likely reactive. No enlarged axillary lymph nodes. 2.9 cm left thyroid nodule, mildly enlarged since 2011 when it measured 2.1 cm. The trachea and esophagus demonstrate no significant findings. Lungs/Pleura: Progressive ground-glass consolidation in the right greater than left lower lobes and lingula. Scattered smooth interlobular septal thickening. No pleural effusion or pneumothorax. No suspicious pulmonary nodule. Upper Abdomen: No acute abnormality. Unchanged splenic tissue in the right upper quadrant. Musculoskeletal: No chest wall abnormality. No acute or significant osseous findings. Review of the MIP images confirms the above findings. IMPRESSION: 1.  Progressive ground-glass consolidation in the right greater than left lower lobes and lingula, concerning for multilobar pneumonia. 2. No evidence of pulmonary embolism. Unchanged pulmonary arterial hypertension. 3. Unchanged moderate cardiomegaly with evidence of right heart dysfunction. Mild interstitial pulmonary edema. 4. 2.9 cm left thyroid nodule, mildly enlarged since 2011. Recommend non-emergent thyroid ultrasound for further evaluation. Electronically Signed   By: Obie DredgeWilliam T Derry M.D.   On: 01/11/2019 11:06    Pending Labs Unresulted Labs (From admission, onward)    Start     Ordered   01/11/19 1135  Blood culture (routine x 2)  BLOOD CULTURE X 2,   STAT     01/11/19 1134          Vitals/Pain Today's Vitals   01/11/19 0915 01/11/19 0930 01/11/19 1000 01/11/19 1157  BP: 107/65 111/61 (!) 101/58 (!) 92/54  Pulse: 77  86 72  Resp: 19  (!) 25 (!) 24  Temp:      TempSrc:      SpO2: 93% 92% 93% 94%  Weight:      Height:      PainSc:        Isolation Precautions Airborne and Contact precautions  Medications Medications  vancomycin (VANCOCIN) IVPB 1000 mg/200 mL premix (1,000 mg Intravenous New Bag/Given 01/11/19 1241)  iohexol (OMNIPAQUE) 350 MG/ML injection 100 mL (100 mLs Intravenous Contrast Given 01/11/19 1008)  piperacillin-tazobactam (ZOSYN) IVPB 3.375 g (0 g Intravenous Stopped 01/11/19 1241)    Mobility walks Low fall risk   Focused Assessments Pulmonary Assessment Handoff:  Lung sounds: Bilateral Breath Sounds: Diminished O2 Device: Room Air        R Recommendations: See Admitting Provider Note  Report given to:   Additional Notes:

## 2019-01-11 NOTE — ED Notes (Signed)
Patient transported to X-ray 

## 2019-01-11 NOTE — ED Provider Notes (Signed)
[facility] EMERGENCY DEPARTMENT Provider Note   CSN: [id number] Arrival date & time: 01/11/19  0709    History   Chief Complaint No chief complaint on file.   HPI Gary [name] is a 43 y.o. male.     43 yo M with a chief complaint of shortness of breath.  This been going on for the past month or so off and on.  Worsening over the past couple days.  Woke up last night and had to sit up straight and was able to catch his breath after a prolonged period.  The patient had some chest pressure with that.  Denied diaphoresis nausea or vomiting.  Has had a mild cough.  Has had multiple ED visits in the past couple months and has had recurrent pneumonia.  Multiple courses of antibiotics.  States he will get transiently better and then it will recur.  He feels that his symptoms are most likely related to his amiodarone use.  He saw his cardiologist a couple days ago and they felt his symptoms were more related to his atrial fibrillation.  The patient is taken himself off of the amiodarone as he has looked this up on side but this caused shortness of breath.  The history is provided by the patient.  Illness Severity:  Mild Onset quality:  Gradual Duration:  1 month Timing:  Constant Progression:  Worsening Chronicity:  New Associated symptoms: chest pain, cough and shortness of breath   Associated symptoms: no abdominal pain, no congestion, no diarrhea, no fever, no headaches, no myalgias, no rash and no vomiting     Past Medical History:  Diagnosis Date  . Atrial fibrillation (HCC)   . CHF (congestive heart failure) (HCC)   . Dysrhythmia    hx. of  A-fib  . Former smoker    Quit  . Internal bleeding    DUE TO ASPIRIN, OR MEDS  . Murmur     Patient Active Problem List   Diagnosis Date Noted  . Acute on chronic combined systolic and diastolic CHF, NYHA class 3 (HCC) 01/11/2019  . Multifocal pneumonia 01/11/2019  . Former smoker   . Chronic atrial fibrillation  10/27/2018  . Protein-calorie malnutrition, severe 10/17/2017  . Incarcerated inguinal hernia 10/13/2017  . Acute congestive heart failure (HCC) 08/14/2017    Past Surgical History:  Procedure Laterality Date  . ABDOMINAL SURGERY    . ABDOMINAL SURGERY     under 10 yrs. old or younger  . CARDIAC SURGERY    . CARDIOVERSION N/A 08/17/2017   Procedure: CARDIOVERSION;  Surgeon: Orpah Cobb, MD;  Location: [facility] ENDOSCOPY;  Service: Cardiovascular;  Laterality: N/A;  . HERNIA REPAIR    . INGUINAL HERNIA REPAIR Right 10/13/2017   Procedure: OPEN RIGHT INGUINAL HERNIA REPAIR ERAS PATHWAY;  Surgeon: Jimmye Norman, MD;  Location: [facility] OR;  Service: General;  Laterality: Right;  . OPEN HEART SURGERY     AS A CHILD , at 2 yrs. old. done in [place]  . TEE WITHOUT CARDIOVERSION N/A 08/17/2017   Procedure: TRANSESOPHAGEAL ECHOCARDIOGRAM (TEE);  Surgeon: Orpah Cobb, MD;  Location: [facility] ENDOSCOPY;  Service: Cardiovascular;  Laterality: N/A;        Home Medications    Prior to Admission medications   Medication Sig Start Date End Date Taking? Authorizing Provider  furosemide (LASIX) 20 MG tablet Take 1 tablet (20 mg total) by mouth daily. 09/21/18  Yes Dartha Lodge, PA-C  losartan (COZAAR) 25 MG tablet Take 1  tablet (25 mg total) by mouth daily. Patient taking differently: Take 12.5 mg by mouth daily.  08/22/17  Yes Rinaldo CloudHarwani, Mohan, MD  Melatonin 2.5 MG CAPS Take 1 capsule (2.5 mg total) by mouth at bedtime as needed. Patient taking differently: Take 2.5 mg by mouth at bedtime as needed (sleep).  12/07/18  Yes Georgetta HaberBurky, Natalie B, NP  Multiple Vitamin (MULTIVITAMIN WITH MINERALS) TABS tablet Take 1 tablet by mouth daily.   Yes [provider]  warfarin (COUMADIN) 5 MG tablet Take 1 tablet (5 mg total) by mouth daily at 6 PM. 10/28/18  Yes Orpah CobbKadakia, Ajay, MD  amiodarone (PACERONE) 200 MG tablet Take 0.5 tablets (100 mg total) by mouth daily. Patient not taking: Reported on 01/11/2019 10/28/18   Orpah CobbKadakia,  Ajay, MD    Family History Family History  Problem Relation Age of Onset  . Healthy Father     Social History Social History   Tobacco Use  . Smoking status: Former Smoker    Packs/day: 1.00    Years: 20.00    Pack years: 20.00    Types: Cigarettes  . Smokeless tobacco: Never Used  Substance Use Topics  . Alcohol use: Never    Frequency: Never    Comment: social  . Drug use: Never    Types: Marijuana     Allergies   Aspirin   Review of Systems Review of Systems  Constitutional: Negative for chills and fever.  HENT: Negative for congestion and facial swelling.   Eyes: Negative for discharge and visual disturbance.  Respiratory: Positive for cough and shortness of breath.   Cardiovascular: Positive for chest pain. Negative for palpitations.  Gastrointestinal: Negative for abdominal pain, diarrhea and vomiting.  Musculoskeletal: Negative for arthralgias and myalgias.  Skin: Negative for color change and rash.  Neurological: Negative for tremors, syncope and headaches.  Psychiatric/Behavioral: Negative for confusion and dysphoric mood.     Physical Exam Updated Vital Signs BP 128/67   Pulse 68   Temp 98.4 F (36.9 C) (Oral)   Resp (!) 22   Ht 5\' 5"  (1.651 m)   Wt 53.5 kg   SpO2 94%   BMI 19.64 kg/m   Physical Exam Vitals signs and nursing note reviewed.  Constitutional:      Appearance: He is well-developed.  HENT:     Head: Normocephalic and atraumatic.  Eyes:     Pupils: Pupils are equal, round, and reactive to light.  Neck:     Musculoskeletal: Normal range of motion and neck supple.     Vascular: No JVD.     Comments: JVD to mid neck Cardiovascular:     Rate and Rhythm: Normal rate and regular rhythm.     Heart sounds: Murmur (5/6 systolic murmur best heard to the tricuspid pole) present. No friction rub. No gallop.   Pulmonary:     Effort: No respiratory distress.     Breath sounds: No wheezing.  Abdominal:     General: There is no  distension.     Tenderness: There is no abdominal tenderness. There is no guarding or rebound.  Musculoskeletal: Normal range of motion.  Skin:    Coloration: Skin is not pale.     Findings: No rash.  Neurological:     Mental Status: He is alert and oriented to person, place, and time.  Psychiatric:        Behavior: Behavior normal.      ED Treatments / Results  Labs (all labs ordered are listed, but  only abnormal results are displayed) Labs Reviewed  PROTIME-INR - Abnormal; Notable for the following components:      Result Value   Prothrombin Time 30.9 (*)    INR 3.0 (*)    All other components within normal limits  CBC WITH DIFFERENTIAL/PLATELET - Abnormal; Notable for the following components:   WBC 13.1 (*)    RBC 4.18 (*)    HCT 38.0 (*)    Eosinophils Absolute 3.9 (*)    All other components within normal limits  BASIC METABOLIC PANEL - Abnormal; Notable for the following components:   CO2 21 (*)    Glucose, Bld 145 (*)    All other components within normal limits  BRAIN NATRIURETIC PEPTIDE - Abnormal; Notable for the following components:   B Natriuretic Peptide 1,038.0 (*)    All other components within normal limits  TROPONIN I (HIGH SENSITIVITY) - Abnormal; Notable for the following components:   Troponin I (High Sensitivity) 83 (*)    All other components within normal limits  TROPONIN I (HIGH SENSITIVITY) - Abnormal; Notable for the following components:   Troponin I (High Sensitivity) 80 (*)    All other components within normal limits  SARS CORONAVIRUS 2 (HOSPITAL ORDER, PERFORMED IN [facility])  CULTURE, BLOOD (ROUTINE X 2)  CULTURE, BLOOD (ROUTINE X 2)    EKG EKG Interpretation  Date/Time:  Friday January 11 2019 07:17:41 EDT Ventricular Rate:  57 PR Interval:    QRS Duration: 113 QT Interval:  469 QTC Calculation: 457 R Axis:   74 Text Interpretation:  Atrial fibrillation Left ventricular hypertrophy Repol abnrm suggests ischemia,  diffuse leads No significant change since last tracing Confirmed by Melene PlanFloyd, Giles Currie [phone]) on 01/11/2019 8:13:38 AM   Radiology Dg Chest 2 View  Result Date: 01/11/2019 CLINICAL DATA:  Shortness of breath. EXAM: CHEST - 2 VIEW COMPARISON:  Chest x-ray dated December 27, 2018. FINDINGS: Stable moderate cardiomegaly. New pulmonary vascular congestion with increased interstitial markings peripherally at the lung bases. Increasing opacity in lingula and right lower lobe. No pleural effusion or pneumothorax. No acute osseous abnormality. IMPRESSION: 1. Worsening airspace disease in the lingula and right lower lobe, concerning for multifocal pneumonia. Followup PA and lateral chest X-ray is recommended in 3-4 weeks following trial of antibiotic therapy to ensure resolution. 2. Stable moderate cardiomegaly. New pulmonary vascular congestion and mild interstitial edema. Electronically Signed   By: Obie DredgeWilliam T Derry M.D.   On: 01/11/2019 08:21   Ct Angio Chest Pe W And/or Wo Contrast  Result Date: 01/11/2019 CLINICAL DATA:  Chest tightness.  Recent pneumonia. EXAM: CT ANGIOGRAPHY CHEST WITH CONTRAST TECHNIQUE: Multidetector CT imaging of the chest was performed using the standard protocol during bolus administration of intravenous contrast. Multiplanar CT image reconstructions and MIPs were obtained to evaluate the vascular anatomy. CONTRAST:  100mL OMNIPAQUE IOHEXOL 350 MG/ML SOLN COMPARISON:  CTA chest dated October 25, 2018. FINDINGS: Cardiovascular: Satisfactory opacification of the pulmonary arteries to the segmental level. No evidence of pulmonary embolism. 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 Stable cardiomegaly. No pericardial effusion. Prominent reflux of contrast into the intrahepatic IVC and hepatic veins, similar to prior study. No thoracic aortic aneurysm. Dilated left hemiazygous system again noted. Mediastinum/Nodes: New mildly enlarged right paratracheal, subcarinal, and hilar lymph  nodes, likely reactive. No enlarged axillary lymph nodes. 2.9 cm left thyroid nodule, mildly enlarged since 2011 when it measured 2.1 cm. The trachea and esophagus demonstrate no significant findings. Lungs/Pleura: Progressive ground-glass  consolidation in the right greater than left lower lobes and lingula. Scattered smooth interlobular septal thickening. No pleural effusion or pneumothorax. No suspicious pulmonary nodule. Upper Abdomen: No acute abnormality. Unchanged splenic tissue in the right upper quadrant. Musculoskeletal: No chest wall abnormality. No acute or significant osseous findings. Review of the MIP images confirms the above findings. IMPRESSION: 1. Progressive ground-glass consolidation in the right greater than left lower lobes and lingula, concerning for multilobar pneumonia. 2. No evidence of pulmonary embolism. Unchanged pulmonary arterial hypertension. 3. Unchanged moderate cardiomegaly with evidence of right heart dysfunction. Mild interstitial pulmonary edema. 4. 2.9 cm left thyroid nodule, mildly enlarged since 2011. Recommend non-emergent thyroid ultrasound for further evaluation. Electronically Signed   By: Obie DredgeWilliam T Derry M.D.   On: 01/11/2019 11:06    Procedures Procedures (including critical care time)  Medications Ordered in ED Medications  vancomycin (VANCOCIN) IVPB 1000 mg/200 mL premix (1,000 mg Intravenous New Bag/Given 01/11/19 1241)  iohexol (OMNIPAQUE) 350 MG/ML injection 100 mL (100 mLs Intravenous Contrast Given 01/11/19 1008)  piperacillin-tazobactam (ZOSYN) IVPB 3.375 g (0 g Intravenous Stopped 01/11/19 1241)     Initial Impression / Assessment and Plan / ED Course  I have reviewed the triage vital signs and the nursing notes.  Pertinent labs & imaging results that were available during my care of the patient were reviewed by me and considered in my medical decision making (see chart for details).        43 yo M with a chief complaints of shortness of  breath.  This been going on for the past month off and on.  Patient had been diagnosed with recurrent pneumonia.  On different courses of antibiotics with transient improvement and then worsening.  He saw his cardiologist couple days ago and the patient took himself off of amiodarone which she is on for atrial fibrillation.  Patient is currently rate controlled.  Will obtain chest x-ray lab work.  He did have a CT scan done about 3 months ago that was negative for pulmonary embolism, though I wonder if I need to repeat this with the patient having recurrent pneumonias to assess for other possible pathology of shortness of breath.  CT scan is most consistent with multifocal pneumonia.  Will start on broad-spectrum antibiotics.  He does have an elevated BNP and his troponin is elevated though stable on 2 checks.  Discussed with the hospitalist for admission.  The patients results and plan were reviewed and discussed.   Any x-rays performed were independently reviewed by myself.   Differential diagnosis were considered with the presenting HPI.  Medications  vancomycin (VANCOCIN) IVPB 1000 mg/200 mL premix (1,000 mg Intravenous New Bag/Given 01/11/19 1241)  iohexol (OMNIPAQUE) 350 MG/ML injection 100 mL (100 mLs Intravenous Contrast Given 01/11/19 1008)  piperacillin-tazobactam (ZOSYN) IVPB 3.375 g (0 g Intravenous Stopped 01/11/19 1241)    Vitals:   01/11/19 0930 01/11/19 1000 01/11/19 1157 01/11/19 1230  BP: 111/61 (!) 101/58 (!) 92/54 128/67  Pulse:  86 72 68  Resp:  (!) 25 (!) 24 (!) 22  Temp:      TempSrc:      SpO2: 92% 93% 94% 94%  Weight:      Height:        Final diagnoses:  Multifocal pneumonia  Acute on chronic combined systolic and diastolic congestive heart failure (HCC)    Admission/ observation were discussed with the admitting physician, patient and/or family and they are comfortable with the plan.    Final Clinical  Impressions(s) / ED Diagnoses   Final diagnoses:   Multifocal pneumonia  Acute on chronic combined systolic and diastolic congestive heart failure [facility])    ED Discharge Orders    None       Deno Etienne, DO 01/11/19 1259

## 2019-01-11 NOTE — ED Triage Notes (Signed)
Pt arrives with reports of SOB for multiple days. States he has had pneumonia recently and completed his abx. Hx of A-fib and believes the SOB is medication related.

## 2019-01-11 NOTE — Consult Note (Signed)
Reason for Consult: Congestive heart failure Referring Physician: [facility] hospitalist  Carmela HurtMark J Scheiber is an 43 y.o. male.  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  Patient denies any fever or chills. Denies excessive salty food intake denies noncompliance to medication.  Patient has been on very low-dose amiodarone for last few years has not been on beta-blockers due to marked bradycardia and also low blood pressure..  Patient denies any PND orthopnea or leg swelling.  Patient denies any palpitation lightheadedness or syncopal episode. Past Medical History:  Diagnosis Date  . Atrial fibrillation (HCC)   . CHF (congestive heart failure) (HCC)   . Dysrhythmia    hx. of  A-fib  . Former smoker    Quit  . Internal bleeding    DUE TO ASPIRIN, OR MEDS  . Murmur     Past Surgical History:  Procedure Laterality Date  . ABDOMINAL SURGERY    . ABDOMINAL SURGERY     under 10 yrs. old or younger  . CARDIAC SURGERY    . CARDIOVERSION N/A 08/17/2017   Procedure: CARDIOVERSION;  Surgeon: Orpah CobbKadakia, Ajay, MD;  [facility];  Service: Cardiovascular;  Laterality: N/A;  . HERNIA REPAIR    . INGUINAL HERNIA REPAIR Right 10/13/2017   Procedure: OPEN RIGHT INGUINAL HERNIA REPAIR ERAS PATHWAY;  Surgeon: Jimmye NormanWyatt, James, MD;  [facility];  Service: General;  Laterality: Right;  . OPEN HEART SURGERY     AS A CHILD , at 2 yrs. old. [place]  . TEE  WITHOUT CARDIOVERSION N/A 08/17/2017   Procedure: TRANSESOPHAGEAL ECHOCARDIOGRAM (TEE);  Surgeon: Orpah CobbKadakia, Ajay, MD;  [facility];  Service: Cardiovascular;  Laterality: N/A;    Family History  Problem Relation Age of Onset  . Healthy Father     Social History:  reports that he has quit smoking. His smoking use included cigarettes. He has a 20.00 pack-year smoking history. He has never used smokeless tobacco. He reports previous drug use. Drug: Marijuana. He reports that he does not drink alcohol.  Allergies:  Allergies  Allergen Reactions  . Aspirin Other (See Comments)    Internal bleeding     Medications: I have reviewed the patient's current medications.  Results for orders placed or performed during the hospital encounter of 01/11/19 (from the past 48 hour(s))  Protime-INR     Status: Abnormal   Collection Time: 01/11/19  7:35 AM  Result Value Ref Range   Prothrombin Time 30.9 (H) 11.4 - 15.2 seconds   INR 3.0 (H) 0.8 - 1.2    Comment: (NOTE) INR goal varies based on device and disease states. Performed at [facility], [address] [place], [place] [address]   CBC with Differential     Status: Abnormal   Collection Time: 01/11/19  7:35 AM  Result Value Ref Range   WBC 13.1 (H) 4.0 - 10.5 K/uL   RBC 4.18 (L) 4.22 - 5.81 MIL/uL   Hemoglobin 13.4 13.0 - 17.0 g/dL   HCT 19.138.0 (L) 47.839.0 - 29.552.0 %   MCV 90.9 80.0 -  100.0 fL   MCH 32.1 26.0 - 34.0 pg   MCHC 35.3 30.0 - 36.0 g/dL   RDW 29.2 44.6 - 28.6 %   Platelets 160 150 - 400 K/uL   nRBC 0.0 0.0 - 0.2 %   Neutrophils Relative % 52 %   Neutro Abs 6.9 1.7 - 7.7 K/uL   Lymphocytes Relative 9 %   Lymphs Abs 1.2 0.7 - 4.0 K/uL   Monocytes Relative 8 %   Monocytes Absolute 1.0 0.1 - 1.0 K/uL   Eosinophils Relative 30 %   Eosinophils Absolute 3.9 (H) 0.0 - 0.5 K/uL   Basophils Relative 1 %   Basophils Absolute 0.1 0.0 - 0.1 K/uL   Immature Granulocytes 0 %   Abs Immature Granulocytes 0.02 0.00 - 0.07  K/uL    Comment: Performed at [facility], [address], [place], [place] [address]  Basic metabolic panel     Status: Abnormal   Collection Time: 01/11/19  7:35 AM  Result Value Ref Range   Sodium 136 135 - 145 mmol/L   Potassium 4.1 3.5 - 5.1 mmol/L   Chloride 105 98 - 111 mmol/L   CO2 21 (L) 22 - 32 mmol/L   Glucose, Bld 145 (H) 70 - 99 mg/dL   BUN 18 6 - 20 mg/dL   Creatinine, Ser 1.16 0.61 - 1.24 mg/dL   Calcium 9.0 8.9 - 57.9 mg/dL   GFR calc non Af Amer >60 >60 mL/min   GFR calc Af Amer >60 >60 mL/min   Anion gap 10 5 - 15    Comment: Performed at [facility], [address] [place], [place] [address]  Brain natriuretic peptide     Status: Abnormal   Collection Time: 01/11/19  7:35 AM  Result Value Ref Range   B Natriuretic Peptide 1,038.0 (H) 0.0 - 100.0 pg/mL    Comment: Performed at [facility], [address], [place], [place] [address]  Troponin I (High Sensitivity)     Status: Abnormal   Collection Time: 01/11/19  7:35 AM  Result Value Ref Range   Troponin I (High Sensitivity) 83 (H) <18 ng/L    Comment: (NOTE) Elevated high sensitivity troponin I (hsTnI) values and significant  changes across serial measurements may suggest ACS but many other  chronic and acute conditions are known to elevate hsTnI results.  Refer to the "Links" section for chest pain algorithms and additional  guidance. Performed at [facility], [address], [place], [place] [address]   SARS Coronavirus 2 (CEPHEID- Performed in [facility] hospital lab), Hosp Order     Status: None   Collection Time: 01/11/19  9:12 AM   Specimen: Nasopharyngeal Swab  Result Value Ref Range   SARS Coronavirus 2 NEGATIVE NEGATIVE    Comment: (NOTE) If result is NEGATIVE SARS-CoV-2 target nucleic acids are NOT DETECTED. The SARS-CoV-2 RNA is generally detectable in upper and lower  respiratory specimens during the acute phase of infection. The lowest  concentration of  SARS-CoV-2 viral copies this assay can detect is 250  copies / mL. A negative result does not preclude SARS-CoV-2 infection  and should not be used as the sole basis for treatment or other  patient management decisions.  A negative result may occur with  improper specimen collection / handling, submission of specimen other  than nasopharyngeal swab, presence of viral mutation(s) within the  areas targeted by this assay, and inadequate number of viral copies  (<  250 copies / mL). A negative result must be combined with clinical  observations, patient history, and epidemiological information. If result is POSITIVE SARS-CoV-2 target nucleic acids are DETECTED. The SARS-CoV-2 RNA is generally detectable in upper and lower  respiratory specimens dur ing the acute phase of infection.  Positive  results are indicative of active infection with SARS-CoV-2.  Clinical  correlation with patient history and other diagnostic information is  necessary to determine patient infection status.  Positive results do  not rule out bacterial infection or co-infection with other viruses. If result is PRESUMPTIVE POSTIVE SARS-CoV-2 nucleic acids MAY BE PRESENT.   A presumptive positive result was obtained on the submitted specimen  and confirmed on repeat testing.  While 2019 novel coronavirus  (SARS-CoV-2) nucleic acids may be present in the submitted sample  additional confirmatory testing may be necessary for epidemiological  and / or clinical management purposes  to differentiate between  SARS-CoV-2 and other Sarbecovirus currently known to infect humans.  If clinically indicated additional testing with an alternate test  methodology [phone] is advised. The SARS-CoV-2 RNA is generally  detectable in upper and lower respiratory sp ecimens during the acute  phase of infection. The expected result is Negative. Fact Sheet for Patients:  BoilerBrush.com.cyhttps://www.fda.gov/media/136312/download Fact Sheet for Healthcare  Providers: https://pope.com/https://www.fda.gov/media/136313/download This test is not yet approved or cleared by the [place] FDA and has been authorized for detection and/or diagnosis of SARS-CoV-2 by FDA under an Emergency Use Authorization (EUA).  This EUA will remain in effect (meaning this test can be used) for the duration of the COVID-19 declaration under Section 564(b)(1) of the Act, 21 U.S.C. section 360bbb-3(b)(1), unless the authorization is terminated or revoked sooner. Performed at [facility], [address] [place], [place] [address]   Troponin I (High Sensitivity)     Status: Abnormal   Collection Time: 01/11/19  9:23 AM  Result Value Ref Range   Troponin I (High Sensitivity) 80 (H) <18 ng/L    Comment: (NOTE) Elevated high sensitivity troponin I (hsTnI) values and significant  changes across serial measurements may suggest ACS but many other  chronic and acute conditions are known to elevate hsTnI results.  Refer to the "Links" section for chest pain algorithms and additional  guidance. Performed at [facility], [address] [place], [place] [address]     Dg Chest 2 View  Result Date: 01/11/2019 CLINICAL DATA:  Shortness of breath. EXAM: CHEST - 2 VIEW COMPARISON:  Chest x-ray dated December 27, 2018. FINDINGS: Stable moderate cardiomegaly. New pulmonary vascular congestion with increased interstitial markings peripherally at the lung bases. Increasing opacity in lingula and right lower lobe. No pleural effusion or pneumothorax. No acute osseous abnormality. IMPRESSION: 1. Worsening airspace disease in the lingula and right lower lobe, concerning for multifocal pneumonia. Followup PA and lateral chest X-ray is recommended in 3-4 weeks following trial of antibiotic therapy to ensure resolution. 2. Stable moderate cardiomegaly. New pulmonary vascular congestion and mild interstitial edema. Electronically Signed   By: Obie DredgeWilliam T Derry M.D.   On: 01/11/2019 08:21   Ct  Angio Chest Pe W And/or Wo Contrast  Result Date: 01/11/2019 CLINICAL DATA:  Chest tightness.  Recent pneumonia. EXAM: CT ANGIOGRAPHY CHEST WITH CONTRAST TECHNIQUE: Multidetector CT imaging of the chest was performed using the standard protocol during bolus administration of intravenous contrast. Multiplanar CT image reconstructions and MIPs were obtained to evaluate the vascular anatomy. CONTRAST:  100mL OMNIPAQUE IOHEXOL 350 MG/ML SOLN COMPARISON:  CTA chest dated October 25, 2018. FINDINGS: Cardiovascular: Satisfactory opacification of the pulmonary arteries to the segmental level. No evidence of pulmonary embolism. 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 Stable cardiomegaly. No pericardial effusion. Prominent reflux of contrast into the intrahepatic IVC and hepatic veins, similar to prior study. No thoracic aortic aneurysm. Dilated left hemiazygous system again noted. Mediastinum/Nodes: New mildly enlarged right paratracheal, subcarinal, and hilar lymph nodes, likely reactive. No enlarged axillary lymph nodes. 2.9 cm left thyroid nodule, mildly enlarged since 2011 when it measured 2.1 cm. The trachea and esophagus demonstrate no significant findings. Lungs/Pleura: Progressive ground-glass consolidation in the right greater than left lower lobes and lingula. Scattered smooth interlobular septal thickening. No pleural effusion or pneumothorax. No suspicious pulmonary nodule. Upper Abdomen: No acute abnormality. Unchanged splenic tissue in the right upper quadrant. Musculoskeletal: No chest wall abnormality. No acute or significant osseous findings. Review of the MIP images confirms the above findings. IMPRESSION: 1. Progressive ground-glass consolidation in the right greater than left lower lobes and lingula, concerning for multilobar pneumonia. 2. No evidence of pulmonary embolism. Unchanged pulmonary arterial hypertension. 3. Unchanged moderate cardiomegaly with evidence of right heart  dysfunction. Mild interstitial pulmonary edema. 4. 2.9 cm left thyroid nodule, mildly enlarged since 2011. Recommend non-emergent thyroid ultrasound for further evaluation. Electronically Signed   By: Titus Dubin M.D.   On: 01/11/2019 11:06    Review of Systems  Constitutional: Negative for diaphoresis and fever.  Eyes: Negative for blurred vision.  Respiratory: Positive for cough and shortness of breath.   Cardiovascular: Negative for chest pain, palpitations and leg swelling.  Gastrointestinal: Negative for abdominal pain, nausea and vomiting.  Genitourinary: Negative for dysuria.  Neurological: Negative for dizziness.   Blood pressure 106/67, pulse 72, temperature 98.2 F (36.8 C), temperature source Oral, resp. rate 18, height 5\' 5"  (1.651 m), weight 50 kg, SpO2 96 %. Physical Exam  Constitutional: He is oriented to person, place, and time.  HENT:  Head: Normocephalic and atraumatic.  Eyes: Pupils are equal, round, and reactive to light. Conjunctivae are normal. Left eye exhibits no discharge. No scleral icterus.  Neck: Normal range of motion. Neck supple. No JVD present. No tracheal deviation present. No thyromegaly present.  Cardiovascular:  Irregularly irregular bradycardic 2/6 systolic murmur noted and soft S3 gallop noted  Respiratory:  Decreased breath sounds at bases with right basilar rhonchi and rales noted  GI: Soft. Bowel sounds are normal. He exhibits no distension. There is no abdominal tenderness. There is no rebound.  Musculoskeletal:        General: No tenderness, deformity or edema.  Neurological: He is alert and oriented to person, place, and time.    Assessment/Plan: Acute on chronic systolic/diastolic congestive heart failure Non-compaction cardiomyopathy Chronic atrial fibrillation Possible multilobar pneumonia Hypertension Tobacco abuse Elevated blood sugar rule out diabetes mellitus Plan Agree with present management DC amiodarone We will start  low-dose beta-blockers as blood pressure and heart rate tolerates Strict  I&O's and daily weight  Charolette Forward 01/11/2019, 2:41 PM

## 2019-01-11 NOTE — Progress Notes (Signed)
ANTICOAGULATION CONSULT NOTE - Initial Consult  Pharmacy Consult for warfarin Indication: atrial fibrillation  Allergies  Allergen Reactions  . Aspirin Other (See Comments)    Internal bleeding     Patient Measurements: Height: 5\' 5"  (165.1 cm) Weight: 118 lb (53.5 kg) IBW/kg (Calculated) : 61.5  Vital Signs: Temp: 98.4 F (36.9 C) (07/03 0718) Temp Source: Oral (07/03 0718) BP: 106/64 (07/03 1300) Pulse Rate: 68 (07/03 1230)  Labs: Recent Labs    01/11/19 0735 01/11/19 0923  HGB 13.4  --   HCT 38.0*  --   PLT 160  --   LABPROT 30.9*  --   INR 3.0*  --   CREATININE 1.10  --   TROPONINIHS 83* 80*    Estimated Creatinine Clearance: 64.8 mL/min (by C-G formula based on SCr of 1.1 mg/dL).   Medical History: Past Medical History:  Diagnosis Date  . Atrial fibrillation ([place])   . CHF (congestive heart failure) ([place])   . Dysrhythmia    hx. of  A-fib  . Former smoker    Quit  . Internal bleeding    DUE TO ASPIRIN, OR MEDS  . Murmur     Medications:  Scheduled:  . [START ON 01/12/2019] azithromycin  500 mg Oral Daily  . furosemide  40 mg Intravenous BID  . losartan  12.5 mg Oral Daily  . multivitamin with minerals  1 tablet Oral Daily  . potassium chloride  20 mEq Oral BID  . sodium chloride flush  3 mL Intravenous Q12H  . warfarin  5 mg Oral q1800    Assessment: 48 yom presenting with SOB. With hx atrial fibrillation - on warfarin PTA. Home regimen 5 mg daily - LD 7/2 in PM.  INR on admission 3 today. Hgb 13.4, plt 160. No s/sx of bleeding. Starting on CTX/azithromycin for possible PNA.  Goal of Therapy:  INR 2-3 Monitor platelets by anticoagulation protocol: Yes   Plan:  Warfarin 4 mg daily Monitor daily INR, CBC, and for s/sx of bleeding  Antonietta Jewel, PharmD, BCCCP Clinical Pharmacist  Pager: [phone] Phone: [phone] 01/11/2019,1:22 PM

## 2019-01-11 NOTE — H&P (Signed)
History and Physical    Gary [name] JQB:[id number] DOB: [birth year] DOA: 01/11/2019  PCP: Patient, No Pcp Per  Patient coming from: Home  I have personally briefly reviewed patient's old medical records in [place]  Chief Complaint: Short of breath for multiple days despite treatment as outpatient for pneumonia  HPI: Gary [name] is a 43 y.o. 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  Feels that the shortness of breath is been going on for about the past month on and off.  It is worsened over the past couple of days.  He woke up last night had to sit up straight but was able to catch his breath after prolonged period.  Some associated chest pressure but no diaphoresis, nausea, or vomiting.  He has had a mild cough.  He has had no fevers.  He has had no chest congestion.  He has had no production with his cough.  He has had multiple ED visits in the past couple of months and has had recurrent pneumonia.  Multiple courses of antibiotics.  He will get transiently better and then it will recur.  He feels that his symptoms are most likely related to his amiodarone use.  He saw his cardiologist a couple of days ago felt his symptoms were related to his atrial fibrillation.  The patient has decided to take himself off the amiodarone as he had looked it up and it can cause shortness of breath. Patient denies any history of edema but even when he has had exacerbations of his heart failure he has never had peripheral edema.  Cardiomyopathy is not a classic wanted to related more to a congenital birth defect.  ED Course: CT  shows no PE but cardiomegaly with right heart dysfunction some pulmonary edema and multifocal pneumonia right greater than left.  BNP 1038, glucose elevated at 145.  Opponent elevated at 80 and 83.  White blood cell count elevated at 13.  Platelet count 160.  INR appropriate at 3. Patient referred to me for further evaluation and management of pneumonia.  Review of Systems: As per HPI otherwise all other systems reviewed and  negative.   Past Medical History:  Diagnosis Date   Atrial fibrillation (HCC)    CHF (congestive heart failure) (HCC)    Dysrhythmia    hx. of  A-fib   Former smoker    Quit   Internal bleeding    DUE TO ASPIRIN, OR MEDS   Murmur     Past Surgical History:  Procedure Laterality Date   ABDOMINAL SURGERY     ABDOMINAL SURGERY     under 10 yrs. old or younger   [place] N/A 08/17/2017   Procedure: CARDIOVERSION;  Surgeon: Dixie Dials, MD;  Location: [place];  Service: Cardiovascular;  Laterality: N/A;   HERNIA REPAIR     INGUINAL HERNIA REPAIR Right 10/13/2017   Procedure: [place];  Surgeon: Judeth Horn, MD;  Location: [place];  Service: General;  Laterality: Right;   OPEN HEART SURGERY     AS A CHILD , at 2 yrs. old. done in [place]  TEE WITHOUT CARDIOVERSION N/A 08/17/2017   Procedure: TRANSESOPHAGEAL ECHOCARDIOGRAM (TEE);  Surgeon: Orpah CobbKadakia, Ajay, MD;  [facility];  Service: Cardiovascular;  Laterality: N/A;    Social History   Social History Narrative   Not on file     reports that he has quit smoking. His smoking use included cigarettes. He has a 20.00 pack-year smoking history. He has never used smokeless tobacco. He reports that he does not drink alcohol or use drugs.  Allergies  Allergen Reactions   Aspirin Other (See Comments)    Internal bleeding     Family History  Problem Relation Age of Onset   Healthy Father     Prior to Admission  medications   Medication Sig Start Date End Date Taking? Authorizing Provider  furosemide (LASIX) 20 MG tablet Take 1 tablet (20 mg total) by mouth daily. 09/21/18  Yes Dartha LodgeFord, Kelsey N, PA-C  losartan (COZAAR) 25 MG tablet Take 1 tablet (25 mg total) by mouth daily. Patient taking differently: Take 12.5 mg by mouth daily.  08/22/17  Yes Rinaldo CloudHarwani, Mohan, MD  Melatonin 2.5 MG CAPS Take 1 capsule (2.5 mg total) by mouth at bedtime as needed. Patient taking differently: Take 2.5 mg by mouth at bedtime as needed (sleep).  12/07/18  Yes Georgetta HaberBurky, Natalie B, NP  Multiple Vitamin (MULTIVITAMIN WITH MINERALS) TABS tablet Take 1 tablet by mouth daily.   Yes [provider]  warfarin (COUMADIN) 5 MG tablet Take 1 tablet (5 mg total) by mouth daily at 6 PM. 10/28/18  Yes Orpah CobbKadakia, Ajay, MD  amiodarone (PACERONE) 200 MG tablet Take 0.5 tablets (100 mg total) by mouth daily. Patient not taking: Reported on 01/11/2019 10/28/18   Orpah CobbKadakia, Ajay, MD    Physical Exam:  Constitutional: NAD, calm, comfortable, not appear acutely short of breath is not using accessory muscles for respirations Vitals:   01/11/19 1000 01/11/19 1157 01/11/19 1230 01/11/19 1300  BP: (!) 101/58 (!) 92/54 128/67 106/64  Pulse: 86 72 68   Resp: (!) 25 (!) 24 (!) 22 16  Temp:      TempSrc:      SpO2: 93% 94% 94%   Weight:      Height:       Eyes: PERRL, lids and conjunctivae normal ENMT: Mucous membranes are moist. Posterior pharynx clear of any exudate or lesions.Normal dentition.  Neck: normal, supple, no masses, no thyromegaly Respiratory: Coarse bilaterally upper lobes, no wheezing, mild crackles right base. Normal respiratory effort. No accessory muscle use.  Cardiovascular: Irregularly irregular rate and rate controlled rhythm, no murmurs / rubs / gallops. No extremity edema. 2+ pedal pulses. No carotid bruits.  Mild hepatojugular reflux without any jugular venous distention Abdomen: no tenderness, no masses palpated. No  hepatosplenomegaly. Bowel sounds positive.  Musculoskeletal: no clubbing / cyanosis. No joint deformity upper and lower extremities. Good ROM, no contractures. Normal muscle tone.  Skin: no rashes, lesions, ulcers. No induration Neurologic: CN 2-12 grossly intact. Sensation intact, DTR normal. Strength 5/5 in all 4.  Psychiatric: Normal judgment and insight. Alert and oriented x 3. Normal mood.    Labs on Admission: I have personally reviewed following labs and imaging studies  CBC: Recent Labs  Lab 01/11/19 0735  WBC 13.1*  NEUTROABS 6.9  HGB 13.4  HCT 38.0*  MCV 90.9  PLT 160   Basic Metabolic Panel: Recent Labs  Lab 01/11/19 0735  NA 136  K 4.1  CL 105  CO2 21*  GLUCOSE 145*  BUN 18  CREATININE 1.10  CALCIUM 9.0   Coagulation Profile: Recent Labs  Lab 01/11/19 0735  INR 3.0*    Radiological Exams on Admission: Dg Chest 2 View  Result Date: 01/11/2019 CLINICAL DATA:  Shortness of breath. EXAM: CHEST - 2 VIEW COMPARISON:  Chest x-ray dated December 27, 2018. FINDINGS: Stable moderate cardiomegaly. New pulmonary vascular congestion with increased interstitial markings peripherally at the lung bases. Increasing opacity in lingula and right lower lobe. No pleural effusion or pneumothorax. No acute osseous abnormality. IMPRESSION: 1. Worsening airspace disease in the lingula and right lower lobe, concerning for multifocal pneumonia. Followup PA and lateral chest X-ray is recommended in 3-4 weeks following trial of antibiotic therapy to ensure resolution. 2. Stable moderate cardiomegaly. New pulmonary vascular congestion and mild interstitial edema. Electronically Signed   By: Obie DredgeWilliam T Derry M.D.   On: 01/11/2019 08:21   Ct Angio Chest Pe W And/or Wo Contrast  Result Date: 01/11/2019 CLINICAL DATA:  Chest tightness.  Recent pneumonia. EXAM: CT ANGIOGRAPHY CHEST WITH CONTRAST TECHNIQUE: Multidetector CT imaging of the chest was performed using the standard protocol during bolus  administration of intravenous contrast. Multiplanar CT image reconstructions and MIPs were obtained to evaluate the vascular anatomy. CONTRAST:  100mL OMNIPAQUE IOHEXOL 350 MG/ML SOLN COMPARISON:  CTA chest dated October 25, 2018. FINDINGS: Cardiovascular: Satisfactory opacification of the pulmonary arteries to the segmental level. No evidence of pulmonary embolism. 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 Stable cardiomegaly. No pericardial effusion. Prominent reflux of contrast into the intrahepatic IVC and hepatic veins, similar to prior study. No thoracic aortic aneurysm. Dilated left hemiazygous system again noted. Mediastinum/Nodes: New mildly enlarged right paratracheal, subcarinal, and hilar lymph nodes, likely reactive. No enlarged axillary lymph nodes. 2.9 cm left thyroid nodule, mildly enlarged since 2011 when it measured 2.1 cm. The trachea and esophagus demonstrate no significant findings. Lungs/Pleura: Progressive ground-glass consolidation in the right greater than left lower lobes and lingula. Scattered smooth interlobular septal thickening. No pleural effusion or pneumothorax. No suspicious pulmonary nodule. Upper Abdomen: No acute abnormality. Unchanged splenic tissue in the right upper quadrant. Musculoskeletal: No chest wall abnormality. No acute or significant osseous findings. Review of the MIP images confirms the above findings. IMPRESSION: 1. Progressive ground-glass consolidation in the right greater than left lower lobes and lingula, concerning for multilobar pneumonia. 2. No evidence of pulmonary embolism. Unchanged pulmonary arterial hypertension. 3. Unchanged moderate cardiomegaly with evidence of right heart dysfunction. Mild interstitial pulmonary edema. 4. 2.9 cm left thyroid nodule, mildly enlarged since 2011. Recommend non-emergent thyroid ultrasound for further evaluation. Electronically Signed   By: Obie DredgeWilliam T Derry M.D.   On: 01/11/2019 11:06    EKG:  Independently reviewed.  Fibrillation at 57 bpm with left ventricular hypertrophy with repolarization abnormalities.  When compared to May 8 there is no change.  Assessment/Plan Principal Problem:   Acute on chronic combined systolic and diastolic CHF, NYHA class 3 (HCC) Active Problems:   Multifocal pneumonia   Chronic atrial fibrillation   Hyperglycemia    1.  Shortness of breath likely brought on by both acute on chronic combined systolic and diastolic congestive heart failure as well as multifocal pneumonia and chronic atrial fibrillation.  I believe the patient will require treatment of both his heart failure, his atrial fibrillation and his pulmonary issues to achieve less shortness of breath.     A.  Acute on chronic combined systolic and diastolic congestive heart failure: We will increase his Lasix to 40 IV twice  daily.  Patient is not on a beta-blocker I believe this is likely due to bradycardia.  Have consulted Dr. Sharyn Lull his primary cardiologist.  He was recently in the office.   B.  Chronic atrial fibrillation: Patient is usually in atrial fibrillation and has been started on amiodarone but states that despite his amiodarone he remains in atrial fibrillation.  He is concerned about pulmonary effects of amiodarone and has discontinued its use.  I will not restart it now await consultation from Dr. Sharyn Lull.  We will continue warfarin to be dosed by pharmacy.   C.  Multifocal pneumonia: I highly suspect that this is a viral pneumonia as the appearance is groundglass on the chest x-ray.  The patient is COVID negative and do not suspect COVID but he may have another form of viral pneumonia.  I have ordered a respiratory viral panel.  In the ER he received very broad-spectrum antibiotics but he is not extremely ill his white count is only 13 and I suspect that if this is not viral and a bacterial pneumonia routine IV antibiotics would be acceptable in the form of Rocephin and azithromycin to  cover both typical and atypical organisms.  2.  Hyperglycemia: We will check twice daily fingerstick blood glucose and check blood glucose in a.m. on chemistry panel.  Monitor for now.  DVT prophylaxis: Warfarin Code Status: Full code Family Communication: No family present patient retains capacity Disposition Plan: Likely home in 2-3 midnights Consults called: Dr. Sharyn Lull from cardiology Admission status: Inpatient  Admit - It is my clinical opinion that admission to INPATIENT is reasonable and necessary because of the expectation that this patient will require hospital care that crosses at least 2 midnights to treat this condition based on the medical complexity of the problems presented.  Given the aforementioned information, the predictability of an adverse outcome is felt to be significant.   Lahoma Crocker MD FACP [facility] Pager [phone]  How to contact the [facility] Attending or Consulting provider 7A - 7P or covering provider during after hours 7P -7A, for this patient?  1. Check the care team in [facility] and look for a) attending/consulting [facility] provider listed and b) the [facility] team listed 2. Log into [email or web address] and use [facility]'s universal password to access. If you do not have the password, please contact the hospital operator. 3. Locate the [facility] provider you are looking for under [facility] and page to a number that you can be directly reached. 4. If you still have difficulty reaching the provider, please page the [facility] (Director on Call) for the Hospitalists listed on amion for assistance.  If 7PM-7AM, please contact night-coverage [email or web address] Password [email or web address]  01/11/2019, 1:12 PM

## 2019-01-11 NOTE — ED Notes (Signed)
Patient transported to CT 

## 2019-01-12 ENCOUNTER — Inpatient Hospital Stay (HOSPITAL_COMMUNITY): Payer: Medicaid Other

## 2019-01-12 LAB — PROTIME-INR
INR: 2.8 — ABNORMAL HIGH (ref 0.8–1.2)
Prothrombin Time: 29.4 seconds — ABNORMAL HIGH (ref 11.4–15.2)

## 2019-01-12 LAB — CBC
HCT: 39.6 % (ref 39.0–52.0)
Hemoglobin: 14.1 g/dL (ref 13.0–17.0)
MCH: 32.2 pg (ref 26.0–34.0)
MCHC: 35.6 g/dL (ref 30.0–36.0)
MCV: 90.4 fL (ref 80.0–100.0)
Platelets: 157 10*3/uL (ref 150–400)
RBC: 4.38 MIL/uL (ref 4.22–5.81)
RDW: 11.7 % (ref 11.5–15.5)
WBC: 15.5 10*3/uL — ABNORMAL HIGH (ref 4.0–10.5)
nRBC: 0 % (ref 0.0–0.2)

## 2019-01-12 LAB — BASIC METABOLIC PANEL
Anion gap: 10 (ref 5–15)
BUN: 15 mg/dL (ref 6–20)
CO2: 23 mmol/L (ref 22–32)
Calcium: 9 mg/dL (ref 8.9–10.3)
Chloride: 105 mmol/L (ref 98–111)
Creatinine, Ser: 1.08 mg/dL (ref 0.61–1.24)
GFR calc Af Amer: >60 mL/min (ref >60)
GFR calc non Af Amer: >60 mL/min (ref >60)
Glucose, Bld: 105 mg/dL — ABNORMAL HIGH (ref 70–99)
Potassium: 4 mmol/L (ref 3.5–5.1)
Sodium: 138 mmol/L (ref 135–145)

## 2019-01-12 LAB — HIV ANTIBODY (ROUTINE TESTING W REFLEX): HIV Screen 4th Generation wRfx: NONREACTIVE

## 2019-01-12 MED ORDER — WARFARIN SODIUM 2 MG PO TABS
4.0000 mg | ORAL_TABLET | Freq: Once | ORAL | Status: AC
  Administered 2019-01-12: 4 mg via ORAL
  Filled 2019-01-12: qty 2

## 2019-01-12 NOTE — Progress Notes (Signed)
[facility] Progress Note  Patient: Gary [name] PJK:[id number]   PCP: Patient, No Pcp Per DOB: Aug 14, 1975   DOA: 01/11/2019   DOS: 01/12/2019   Date of Service: the patient was seen and examined on 01/12/2019  Brief hospital course: Pt. 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 Currently further plan is continue diuresis.  Subjective: Feeling better than yesterday.  Breathing better.  No chest pain abdominal pain.  No nausea no vomiting.  Still has some cough.  No fever here in the hospital.  Assessment and Plan: Acute on chronic combined systolic and diastolic congestive heart failure:  Continue Lasix to 40 IV twice daily.  Patient is not on a beta-blocker I believe this is likely due to bradycardia. Have consulted Dr. Sharyn Lull his primary cardiologist.  He was recently in the office.  Chronic atrial fibrillation:  Patient is usually in atrial fibrillation and has been started on amiodarone but states that despite his amiodarone he remains in atrial fibrillation.   He is concerned about pulmonary effects of amiodarone and has discontinued its use.   We will continue warfarin to be dosed by pharmacy.  Multifocal pneumonia:  patient is COVID negative and do not suspect COVID but he may have another form of viral pneumonia. Negative respiratory viral panel. Transition to IV cephalosporin for now.   Hyperglycemia: We will check twice daily fingerstick blood glucose and check blood glucose in a.m. on chemistry panel.  Monitor for now.  Diet: Carb modified diet DVT Prophylaxis: Subcutaneous Heparin   Advance goals of care discussion:  Full code  Family Communication: no family was present at bedside, at the time of interview.  Disposition:  Discharge to Home .  Consultants: cardiology  Procedures: none  Scheduled Meds: . furosemide  40 mg Intravenous BID  . multivitamin with minerals  1 tablet Oral Daily  . potassium chloride  20 mEq Oral BID  . sodium chloride flush  3 mL Intravenous Q12H  . Warfarin - Pharmacist Dosing Inpatient   Does not apply q1800   Continuous Infusions: . sodium chloride 250 mL (01/12/19 1416)  . cefTRIAXone (ROCEPHIN)  IV 2 g (01/12/19 1417)   PRN Meds: sodium chloride, acetaminophen, Melatonin, ondansetron (ZOFRAN) IV, sodium chloride flush, zolpidem Antibiotics: Anti-infectives (From admission, onward)   Start     Dose/Rate Route Frequency Ordered Stop   01/11/19 1430  cefTRIAXone (ROCEPHIN) 2 g in sodium chloride 0.9 % 100 mL IVPB     2 g 200 mL/hr over 30 Minutes Intravenous Every 24 hours 01/11/19 1310 01/16/19 1429   01/11/19 1430  azithromycin (ZITHROMAX) tablet 500 mg  Status:  Discontinued     500 mg Oral Daily 01/11/19 1310 01/12/19 1713   01/11/19 1315  cefTRIAXone (ROCEPHIN) 2 g in sodium chloride 0.9 % 100 mL IVPB  Status:  Discontinued     2 g 200 mL/hr over 30 Minutes Intravenous Every 24 hours 01/11/19 1307 01/11/19 1310   01/11/19 1315  azithromycin (ZITHROMAX) tablet 500 mg  Status:  Discontinued     500 mg Oral Daily 01/11/19 1307 01/11/19 1310   01/11/19 1145  vancomycin (VANCOCIN) IVPB 1000 mg/200 mL premix  1,000 mg 200 mL/hr over 60 Minutes Intravenous  Once 01/11/19 1134 01/11/19 1500   01/11/19 1145  piperacillin-tazobactam (ZOSYN) IVPB 3.375 g     3.375 g 100 mL/hr over 30 Minutes Intravenous  Once 01/11/19 1134 01/11/19 1241       Objective: Physical Exam: Vitals:   01/12/19 0031 01/12/19 0436 01/12/19 0921 01/12/19 1706  BP: 92/64 102/67 (!) 103/58 105/68  Pulse: 64 (!) 45 75 (!) 51  Resp: 18 18 17 19   Temp: 98.7 F (37.1 C) 99.1 F (37.3  C) 98.9 F (37.2 C)   TempSrc: Oral Oral Oral   SpO2: 93% 91% 95% 95%  Weight:  48.4 kg    Height:        Intake/Output Summary (Last 24 hours) at 01/12/2019 1821 Last data filed at 01/12/2019 1718 Gross per 24 hour  Intake 945.55 ml  Output 1975 ml  Net -1029.45 ml   Filed Weights   01/11/19 0719 01/11/19 1349 01/12/19 0436  Weight: 53.5 kg 50 kg 48.4 kg   General: alert and oriented to time, place, and person. Appear in mild distress, affect appropriate Eyes: PERRL, Conjunctiva normal ENT: Oral Mucosa Clear, moist  Neck: no JVD, no Abnormal Mass Or lumps Cardiovascular: S1 and S2 Present, aortic systolic Murmur, peripheral pulses symmetrical Respiratory: normal respiratory effort, Bilateral Air entry equal and Decreased, no use of accessory muscle, Clear to Auscultation, no Crackles, no wheezes Abdomen: Bowel Sound present, Soft and no tenderness, no hernia Skin: no rashes  Extremities: no Pedal edema, no calf tenderness Neurologic: normal without focal findings, mental status, speech normal, alert and oriented x3, PERLA, Motor strength 5/5 and symmetric and sensation grossly normal to light touch Gait not checked due to patient safety concerns  Data Reviewed: CBC: Recent Labs  Lab 01/11/19 0735 01/12/19 0421  WBC 13.1* 15.5*  NEUTROABS 6.9  --   HGB 13.4 14.1  HCT 38.0* 39.6  MCV 90.9 90.4  PLT 160 416   Basic Metabolic Panel: Recent Labs  Lab 01/11/19 0735 01/12/19 0421  NA 136 138  K 4.1 4.0  CL 105 105  CO2 21* 23  GLUCOSE 145* 105*  BUN 18 15  CREATININE 1.10 1.08  CALCIUM 9.0 9.0    Liver Function Tests: No results for input(s): AST, ALT, ALKPHOS, BILITOT, PROT, ALBUMIN in the last 168 hours. No results for input(s): LIPASE, AMYLASE in the last 168 hours. No results for input(s): AMMONIA in the last 168 hours. Coagulation Profile: Recent Labs  Lab 01/11/19 0735 01/12/19 0421  INR 3.0* 2.8*   Cardiac Enzymes: No results for input(s): CKTOTAL,  CKMB, CKMBINDEX, TROPONINI in the last 168 hours. BNP (last 3 results) No results for input(s): PROBNP in the last 8760 hours. CBG: No results for input(s): GLUCAP in the last 168 hours. Studies: Dg Chest 2 View  Result Date: 01/12/2019 CLINICAL DATA:  Follow-up pneumonia EXAM: CHEST - 2 VIEW COMPARISON:  01/11/2019 FINDINGS: Cardiomegaly with right heart enlargement as seen previously. Persistent bilateral interstitial and airspace density, with some worsening in the left mid lung. Findings could represent a combination of edema and pneumonia. No effusions visible on the lateral view. Chronic spinal curvature incidentally noted. IMPRESSION: Persisting cardiomegaly with right heart prominence. Persistent interstitial and alveolar density, with worsening in the left mid lung. Findings could represent a combination of edema and pneumonia. Electronically Signed   By: Nelson Chimes M.D.   On: 01/12/2019 08:19     Time spent: 35 minutes  Author:  Lynden OxfordPranav Marijean Montanye, MD [facility] 01/12/2019 6:21 PM  To reach On-call, see care teams to locate the attending and reach out to them via [email or web address]. If 7PM-7AM, please contact night-coverage If you still have difficulty reaching the attending provider, please page the [facility] (Director on Call) for [facility] on amion for assistance.

## 2019-01-12 NOTE — Progress Notes (Addendum)
Per CCMD pt had a-fib with slow ventricular response. HR of 45.  Pt stated feeling the same, no shortness of breath at this moment.  Pt is currently sitting in bed eating dinner.  Vitals are as follows:   01/12/19 1706  Vitals  BP 105/68  MAP (mmHg) 80  BP Location Left Arm  BP Method Automatic  Patient Position (if appropriate) Sitting  Pulse Rate (!) 51  Pulse Rate Source Monitor  Resp 19  Oxygen Therapy  SpO2 95 %  O2 Device Room Air   EKG completed and placed in pt's chart.   Will page Posey Pronto MD to make aware.

## 2019-01-12 NOTE — Progress Notes (Signed)
ANTICOAGULATION CONSULT NOTE - Follow Up Consult  Pharmacy Consult for warfarin Indication: atrial fibrillation  Allergies  Allergen Reactions  . Aspirin Other (See Comments)    Internal bleeding     Patient Measurements: Height: 5\' 5"  (165.1 cm) Weight: 106 lb 9.6 oz (48.4 kg) IBW/kg (Calculated) : 61.5  Vital Signs: Temp: 99.1 F (37.3 C) (07/04 0436) Temp Source: Oral (07/04 0436) BP: 102/67 (07/04 0436) Pulse Rate: 45 (07/04 0436)  Labs: Recent Labs    01/11/19 0735 01/11/19 0923 01/12/19 0421  HGB 13.4  --  14.1  HCT 38.0*  --  39.6  PLT 160  --  157  LABPROT 30.9*  --  29.4*  INR 3.0*  --  2.8*  CREATININE 1.10  --  1.08  TROPONINIHS 83* 80*  --     Estimated Creatinine Clearance: 59.8 mL/min (by C-G formula based on SCr of 1.08 mg/dL).   Medical History: Past Medical History:  Diagnosis Date  . Atrial fibrillation ([place])   . CHF (congestive heart failure) ([place])   . Dysrhythmia    hx. of  A-fib  . Former smoker    Quit  . Internal bleeding    DUE TO ASPIRIN, OR MEDS  . Murmur     Medications:  Scheduled:  . azithromycin  500 mg Oral Daily  . furosemide  40 mg Intravenous BID  . losartan  12.5 mg Oral Daily  . multivitamin with minerals  1 tablet Oral Daily  . potassium chloride  20 mEq Oral BID  . sodium chloride flush  3 mL Intravenous Q12H  . Warfarin - Pharmacist Dosing Inpatient   Does not apply q1800    Assessment: 19 yom presenting with SOB. With hx atrial fibrillation - on warfarin PTA. Home regimen 5 mg daily  INR 2.8 today. Hgb 14.1, plt 157. No s/sx of bleeding. Starting on CTX/azithromycin for possible PNA.  Goal of Therapy:  INR 2-3 Monitor platelets by anticoagulation protocol: Yes   Plan:  Warfarin 4 mg x 1 Monitor daily INR, CBC, and for s/sx of bleeding  Vertis Kelch, PharmD PGY2 Cardiology Pharmacy Resident Phone [phone] 01/12/2019       9:10 AM  Please check [email or web address] for unit-specific pharmacist phone  numbers

## 2019-01-12 NOTE — Progress Notes (Signed)
Subjective:  Patient denies any chest pain.  States breathing has improved.  Denies any cough, fever, chills, white count remains elevated  Objective:  Vital Signs in the last 24 hours: Temp:  [98.2 F (36.8 C)-99.3 F (37.4 C)] 98.9 F (37.2 C) (07/04 0921) Pulse Rate:  [30-77] 75 (07/04 0921) Resp:  [16-24] 17 (07/04 0921) BP: (92-128)/(53-67) 103/58 (07/04 0921) SpO2:  [91 %-96 %] 95 % (07/04 0921) Weight:  [48.4 kg-50 kg] 48.4 kg (07/04 0436)  Intake/Output from previous day: 07/03 0701 - 07/04 0700 In: 682.5 [P.O.:480; I.V.:2.5; IV Piggyback:200] Out: 1000 [Urine:1000] Intake/Output from this shift: Total I/O In: 360 [P.O.:360] Out: 1175 [Urine:1175]  Physical Exam: Neck: no adenopathy, no carotid bruit, no JVD and supple, symmetrical, trachea midline Lungs: decreased breath sounds at bases with occasional rhonchi Heart: irregularly irregular rhythm, S1, S2 normal and 2/6 systolic murmur and soft S3 gallop noted Abdomen: soft, non-tender; bowel sounds normal; no masses,  no organomegaly Extremities: extremities normal, atraumatic, no cyanosis or edema  Lab Results: Recent Labs    01/11/19 0735 01/12/19 0421  WBC 13.1* 15.5*  HGB 13.4 14.1  PLT 160 157   Recent Labs    01/11/19 0735 01/12/19 0421  NA 136 138  K 4.1 4.0  CL 105 105  CO2 21* 23  GLUCOSE 145* 105*  BUN 18 15  CREATININE 1.10 1.08   No results for input(s): TROPONINI in the last 72 hours.  Invalid input(s): CK, MB Hepatic Function Panel No results for input(s): PROT, ALBUMIN, AST, ALT, ALKPHOS, BILITOT, BILIDIR, IBILI in the last 72 hours. No results for input(s): CHOL in the last 72 hours. No results for input(s): PROTIME in the last 72 hours.  Imaging: Imaging results have been reviewed and Dg Chest 2 View  Result Date: 01/12/2019 CLINICAL DATA:  Follow-up pneumonia EXAM: CHEST - 2 VIEW COMPARISON:  01/11/2019 FINDINGS: Cardiomegaly with right heart enlargement as seen previously.  Persistent bilateral interstitial and airspace density, with some worsening in the left mid lung. Findings could represent a combination of edema and pneumonia. No effusions visible on the lateral view. Chronic spinal curvature incidentally noted. IMPRESSION: Persisting cardiomegaly with right heart prominence. Persistent interstitial and alveolar density, with worsening in the left mid lung. Findings could represent a combination of edema and pneumonia. Electronically Signed   By: Paulina Fusi M.D.   On: 01/12/2019 08:19   Dg Chest 2 View  Result Date: 01/11/2019 CLINICAL DATA:  Shortness of breath. EXAM: CHEST - 2 VIEW COMPARISON:  Chest x-ray dated December 27, 2018. FINDINGS: Stable moderate cardiomegaly. New pulmonary vascular congestion with increased interstitial markings peripherally at the lung bases. Increasing opacity in lingula and right lower lobe. No pleural effusion or pneumothorax. No acute osseous abnormality. IMPRESSION: 1. Worsening airspace disease in the lingula and right lower lobe, concerning for multifocal pneumonia. Followup PA and lateral chest X-ray is recommended in 3-4 weeks following trial of antibiotic therapy to ensure resolution. 2. Stable moderate cardiomegaly. New pulmonary vascular congestion and mild interstitial edema. Electronically Signed   By: Obie Dredge M.D.   On: 01/11/2019 08:21   Ct Angio Chest Pe W And/or Wo Contrast  Result Date: 01/11/2019 CLINICAL DATA:  Chest tightness.  Recent pneumonia. EXAM: CT ANGIOGRAPHY CHEST WITH CONTRAST TECHNIQUE: Multidetector CT imaging of the chest was performed using the standard protocol during bolus administration of intravenous contrast. Multiplanar CT image reconstructions and MIPs were obtained to evaluate the vascular anatomy. CONTRAST:  OMNIPAQUE IOHEXOL 350  MG/ML SOLN COMPARISON:  CTA chest dated October 25, 2018. FINDINGS: Cardiovascular: Satisfactory opacification of the pulmonary arteries to the segmental level. No  evidence of pulmonary embolism. 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 Stable cardiomegaly. No pericardial effusion. Prominent reflux of contrast into the intrahepatic IVC and hepatic veins, similar to prior study. No thoracic aortic aneurysm. Dilated left hemiazygous system again noted. Mediastinum/Nodes: New mildly enlarged right paratracheal, subcarinal, and hilar lymph nodes, likely reactive. No enlarged axillary lymph nodes. 2.9 cm left thyroid nodule, mildly enlarged since 2011 when it measured 2.1 cm. The trachea and esophagus demonstrate no significant findings. Lungs/Pleura: Progressive ground-glass consolidation in the right greater than left lower lobes and lingula. Scattered smooth interlobular septal thickening. No pleural effusion or pneumothorax. No suspicious pulmonary nodule. Upper Abdomen: No acute abnormality. Unchanged splenic tissue in the right upper quadrant. Musculoskeletal: No chest wall abnormality. No acute or significant osseous findings. Review of the MIP images confirms the above findings. IMPRESSION: 1. Progressive ground-glass consolidation in the right greater than left lower lobes and lingula, concerning for multilobar pneumonia. 2. No evidence of pulmonary embolism. Unchanged pulmonary arterial hypertension. 3. Unchanged moderate cardiomegaly with evidence of right heart dysfunction. Mild interstitial pulmonary edema. 4. 2.9 cm left thyroid nodule, mildly enlarged since 2011. Recommend non-emergent thyroid ultrasound for further evaluation. Electronically Signed   By: Titus Dubin M.D.   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  Once fully compensated and blood pressure tolerates  LOS:  1 day    Charolette Forward 01/12/2019, 11:46 AM

## 2019-01-13 LAB — BASIC METABOLIC PANEL WITH GFR
Anion gap: 8 (ref 5–15)
BUN: 14 mg/dL (ref 6–20)
CO2: 23 mmol/L (ref 22–32)
Calcium: 8.8 mg/dL — ABNORMAL LOW (ref 8.9–10.3)
Chloride: 104 mmol/L (ref 98–111)
Creatinine, Ser: 0.88 mg/dL (ref 0.61–1.24)
GFR calc Af Amer: >60 mL/min
GFR calc non Af Amer: >60 mL/min
Glucose, Bld: 111 mg/dL — ABNORMAL HIGH (ref 70–99)
Potassium: 3.9 mmol/L (ref 3.5–5.1)
Sodium: 135 mmol/L (ref 135–145)

## 2019-01-13 LAB — PROTIME-INR
INR: 2.9 — ABNORMAL HIGH (ref 0.8–1.2)
Prothrombin Time: 29.7 seconds — ABNORMAL HIGH (ref 11.4–15.2)

## 2019-01-13 LAB — CBC
HCT: 40.8 % (ref 39.0–52.0)
Hemoglobin: 14.4 g/dL (ref 13.0–17.0)
MCH: 31.9 pg (ref 26.0–34.0)
MCHC: 35.3 g/dL (ref 30.0–36.0)
MCV: 90.3 fL (ref 80.0–100.0)
Platelets: 157 K/uL (ref 150–400)
RBC: 4.52 MIL/uL (ref 4.22–5.81)
RDW: 11.7 % (ref 11.5–15.5)
WBC: 15.7 K/uL — ABNORMAL HIGH (ref 4.0–10.5)
nRBC: 0 % (ref 0.0–0.2)

## 2019-01-13 MED ORDER — FUROSEMIDE 40 MG PO TABS: ORAL_TABLET | ORAL | 0 refills | Status: DC

## 2019-01-13 MED ORDER — AMOXICILLIN-POT CLAVULANATE 875-125 MG PO TABS: 1.0000 | ORAL_TABLET | Freq: Two times a day (BID) | ORAL | 0 refills | Status: AC

## 2019-01-13 MED ORDER — WARFARIN SODIUM 2 MG PO TABS: 4.0000 mg | ORAL_TABLET | Freq: Every day | ORAL | Status: DC

## 2019-01-13 MED ORDER — POTASSIUM CHLORIDE CRYS ER 20 MEQ PO TBCR: 20.0000 meq | EXTENDED_RELEASE_TABLET | Freq: Every day | ORAL | 0 refills | Status: DC

## 2019-01-13 MED ORDER — AMOXICILLIN-POT CLAVULANATE 875-125 MG PO TABS
1.0000 | ORAL_TABLET | Freq: Two times a day (BID) | ORAL | Status: DC
  Administered 2019-01-13: 1 via ORAL
  Filled 2019-01-13: qty 1

## 2019-01-13 NOTE — Discharge Instructions (Signed)
Community-Acquired Pneumonia, Adult Pneumonia is an infection of the lungs. It causes swelling in the airways of the lungs. Mucus and fluid may also build up inside the airways. One type of pneumonia can happen while a person is in a hospital. A different type can happen when a person is not in a hospital (community-acquired pneumonia).  What are the causes?  This condition is caused by germs (viruses, bacteria, or fungi). Some types of germs can be passed from one person to another. This can happen when you breathe in droplets from the cough or sneeze of an infected person. What increases the risk? You are more likely to develop this condition if you:  Have a long-term (chronic) disease, such as: ? Chronic obstructive pulmonary disease (COPD). ? Asthma. ? Cystic fibrosis. ? Congestive heart failure. ? Diabetes. ? Kidney disease.  Have HIV.  Have sickle cell disease.  Have had your spleen removed.  Do not take good care of your teeth and mouth (poor dental hygiene).  Have a medical condition that increases the risk of breathing in droplets from your own mouth and nose.  Have a weakened body defense system (immune system).  Are a smoker.  Travel to areas where the germs that cause this illness are common.  Are around certain animals or the places they live. What are the signs or symptoms?  A dry cough.  A wet (productive) cough.  Fever.  Sweating.  Chest pain. This often happens when breathing deeply or coughing.  Fast breathing or trouble breathing.  Shortness of breath.  Shaking chills.  Feeling tired (fatigue).  Muscle aches. How is this treated? Treatment for this condition depends on many things. Most adults can be treated at home. In some cases, treatment must happen in a hospital. Treatment may include:  Medicines given by mouth or through an IV tube.  Being given extra oxygen.  Respiratory therapy. In rare cases, treatment for very bad  pneumonia may include:  Using a machine to help you breathe.  Having a procedure to remove fluid from around your lungs. Follow these instructions at home: Medicines  Take over-the-counter and prescription medicines only as told by your doctor. ? Only take cough medicine if you are losing sleep.  If you were prescribed an antibiotic medicine, take it as told by your doctor. Do not stop taking the antibiotic even if you start to feel better. General instructions   Sleep with your head and neck raised (elevated). You can do this by sleeping in a recliner or by putting a few pillows under your head.  Rest as needed. Get at least 8 hours of sleep each night.  Drink enough water to keep your pee (urine) pale yellow.  Eat a healthy diet that includes plenty of vegetables, fruits, whole grains, low-fat dairy products, and lean protein.  Do not use any products that contain nicotine or tobacco. These include cigarettes, e-cigarettes, and chewing tobacco. If you need help quitting, ask your doctor.  Keep all follow-up visits as told by your doctor. This is important. How is this prevented? A shot (vaccine) can help prevent pneumonia. Shots are often suggested for:  People older than 44 years of age.  People older than 44 years of age who: ? Are having cancer treatment. ? Have long-term (chronic) lung disease. ? Have problems with their body's defense system. You may also prevent pneumonia if you take these actions:  Get the flu (influenza) shot every year.  Go to the dentist  as often as told.  Wash your hands often. If you cannot use soap and water, use hand sanitizer. Contact a doctor if:  You have a fever.  You lose sleep because your cough medicine does not help. Get help right away if:  You are short of breath and it gets worse.  You have more chest pain.  Your sickness gets worse. This is very serious if: ? You are an older adult. ? Your body's defense system is  weak.  You cough up blood. Summary  Pneumonia is an infection of the lungs.  Most adults can be treated at home. Some will need treatment in a hospital.  Drink enough water to keep your pee pale yellow.  Get at least 8 hours of sleep each night. This information is not intended to replace advice given to you by your health care provider. Make sure you discuss any questions you have with your health care provider. Document Released: 12/14/2007 Document Revised: 10/17/2018 Document Reviewed: 02/22/2018 Elsevier Patient Education  2020 [employer]. Heart Failure Action Plan A heart failure action plan helps you understand what to do when you have symptoms of heart failure. Follow the plan that was created by you and your health care provider. Review your plan each time you visit your health care provider. Red zone These signs and symptoms mean you should get medical help right away:  You have trouble breathing when resting.  You have a dry cough that is getting worse.  You have swelling or pain in your legs or abdomen that is getting worse.  You suddenly gain more than 2-3 lb (0.9-1.4 kg) in a day, or more than 5 lb (2.3 kg) in one week. This amount may be more or less depending on your condition.  You have trouble staying awake or you feel confused.  You have chest pain.  You do not have an appetite.  You pass out. If you experience any of these symptoms:  Call your local emergency services (911 in the [place] right away or seek help at the emergency department of the nearest hospital. Yellow zone These signs and symptoms mean your condition may be getting worse and you should make some changes:  You have trouble breathing when you are active or you need to sleep with extra pillows.  You have swelling in your legs or abdomen.  You gain 2-3 lb (0.9-1.4 kg) in one day, or 5 lb (2.3 kg) in one week. This amount may be more or less depending on your condition.  You get tired  easily.  You have trouble sleeping.  You have a dry cough. If you experience any of these symptoms:  Contact your health care provider within the next day.  Your health care provider may adjust your medicines. Green zone These signs mean you are doing well and can continue what you are doing:  You do not have shortness of breath.  You have very little swelling or no new swelling.  Your weight is stable (no gain or loss).  You have a normal activity level.  You do not have chest pain or any other new symptoms. Follow these instructions at home:  Take over-the-counter and prescription medicines only as told by your health care provider.  Weigh yourself daily. Your target weight is __________ lb (__________ kg). ? Call your health care provider if you gain more than __________ lb (__________ kg) in a day, or more than __________ lb (__________ kg) in one week.  Eat  a heart-healthy diet. Work with a diet and nutrition specialist (dietitian) to create an eating plan that is best for you.  Keep all follow-up visits as told by your health care provider. This is important. Where to find more information  American Heart Association: www.heart.org Summary  Follow the action plan that was created by you and your health care provider.  Get help right away if you have any symptoms in the Red zone. This information is not intended to replace advice given to you by your health care provider. Make sure you discuss any questions you have with your health care provider. Document Released: 08/06/2016 Document Revised: 06/09/2017 Document Reviewed: 08/06/2016 Elsevier Patient Education  2020 [employer].

## 2019-01-13 NOTE — Progress Notes (Signed)
Subjective:  Patient denies any chest pain states breathing has markedly improved..  Denies PND orthopnea or leg swelling.  Objective:  Vital Signs in the last 24 hours: Temp:  [98.6 F (37 C)-99.5 F (37.5 C)] 98.6 F (37 C) (07/05 0413) Pulse Rate:  [51-66] 66 (07/05 0413) Resp:  [18-19] 18 (07/05 0413) BP: (86-105)/(47-68) 95/52 (07/05 0413) SpO2:  [95 %-99 %] 95 % (07/05 0413) Weight:  [48.9 kg] 48.9 kg (07/05 0417)  Intake/Output from previous day: 07/04 0701 - 07/05 0700 In: 1185.6 [P.O.:1080; I.V.:5.6; IV Piggyback:100] Out: 1600 [Urine:1600] Intake/Output from this shift: Total I/O In: 480 [P.O.:480] Out: 0   Physical Exam: Exam unchanged  Lab Results: Recent Labs    01/12/19 0421 01/13/19 0512  WBC 15.5* 15.7*  HGB 14.1 14.4  PLT 157 157   Recent Labs    01/12/19 0421 01/13/19 0512  NA 138 135  K 4.0 3.9  CL 105 104  CO2 23 23  GLUCOSE 105* 111*  BUN 15 14  CREATININE 1.08 0.88   No results for input(s): TROPONINI in the last 72 hours.  Invalid input(s): CK, MB Hepatic Function Panel No results for input(s): PROT, ALBUMIN, AST, ALT, ALKPHOS, BILITOT, BILIDIR, IBILI in the last 72 hours. No results for input(s): CHOL in the last 72 hours. No results for input(s): PROTIME in the last 72 hours.  Imaging: Imaging results have been reviewed and Dg Chest 2 View  Result Date: 01/12/2019 CLINICAL DATA:  Follow-up pneumonia EXAM: CHEST - 2 VIEW COMPARISON:  01/11/2019 FINDINGS: Cardiomegaly with right heart enlargement as seen previously. Persistent bilateral interstitial and airspace density, with some worsening in the left mid lung. Findings could represent a combination of edema and pneumonia. No effusions visible on the lateral view. Chronic spinal curvature incidentally noted. IMPRESSION: Persisting cardiomegaly with right heart prominence. Persistent interstitial and alveolar density, with worsening in the left mid lung. Findings could represent a  combination of edema and pneumonia. Electronically Signed   By: Nelson Chimes M.D.   On: 01/12/2019 08:19   Ct Angio Chest Pe W And/or Wo Contrast  Result Date: 01/11/2019 CLINICAL DATA:  Chest tightness.  Recent pneumonia. EXAM: CT ANGIOGRAPHY CHEST WITH CONTRAST TECHNIQUE: Multidetector CT imaging of the chest was performed using the standard protocol during bolus administration of intravenous contrast. Multiplanar CT image reconstructions and MIPs were obtained to evaluate the vascular anatomy. CONTRAST:  162mL OMNIPAQUE IOHEXOL 350 MG/ML SOLN COMPARISON:  CTA chest dated October 25, 2018. FINDINGS: Cardiovascular: Satisfactory opacification of the pulmonary arteries to the segmental level. No evidence of pulmonary embolism. 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 Stable cardiomegaly. No pericardial effusion. Prominent reflux of contrast into the intrahepatic IVC and hepatic veins, similar to prior study. No thoracic aortic aneurysm. Dilated left hemiazygous system again noted. Mediastinum/Nodes: New mildly enlarged right paratracheal, subcarinal, and hilar lymph nodes, likely reactive. No enlarged axillary lymph nodes. 2.9 cm left thyroid nodule, mildly enlarged since 2011 when it measured 2.1 cm. The trachea and esophagus demonstrate no significant findings. Lungs/Pleura: Progressive ground-glass consolidation in the right greater than left lower lobes and lingula. Scattered smooth interlobular septal thickening. No pleural effusion or pneumothorax. No suspicious pulmonary nodule. Upper Abdomen: No acute abnormality. Unchanged splenic tissue in the right upper quadrant. Musculoskeletal: No chest wall abnormality. No acute or significant osseous findings. Review of the MIP images confirms the above findings. IMPRESSION: 1. Progressive ground-glass consolidation in the right greater than left lower lobes and lingula,  concerning for multilobar pneumonia. 2. No evidence of pulmonary  embolism. Unchanged pulmonary arterial hypertension. 3. Unchanged moderate cardiomegaly with evidence of right heart dysfunction. Mild interstitial pulmonary edema. 4. 2.9 cm left thyroid nodule, mildly enlarged since 2011. Recommend non-emergent thyroid ultrasound for further evaluation. Electronically Signed   By: Obie Dredge M.D.   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  LOS: 2 days    Rinaldo Cloud 01/13/2019, 9:42 AM

## 2019-01-13 NOTE — Progress Notes (Signed)
ANTICOAGULATION CONSULT NOTE - Follow Up Consult  Pharmacy Consult for Coumadin Indication: atrial fibrillation  Allergies  Allergen Reactions  . Aspirin Other (See Comments)    Internal bleeding     Patient Measurements: Height: 5\' 5"  (165.1 cm) Weight: 107 lb 12.8 oz (48.9 kg) IBW/kg (Calculated) : 61.5  Vital Signs: Temp: 98.5 F (36.9 C) (07/05 1204) Temp Source: Oral (07/05 1204) BP: 91/60 (07/05 1204) Pulse Rate: 75 (07/05 1204)  Labs: Recent Labs    01/11/19 0735 01/11/19 0923 01/12/19 0421 01/13/19 0512  HGB 13.4  --  14.1 14.4  HCT 38.0*  --  39.6 40.8  PLT 160  --  157 157  LABPROT 30.9*  --  29.4* 29.7*  INR 3.0*  --  2.8* 2.9*  CREATININE 1.10  --  1.08 0.88  TROPONINIHS 83* 80*  --   --     Estimated Creatinine Clearance: 74.1 mL/min (by C-G formula based on SCr of 0.88 mg/dL).   Assessment:  Anticoag: Warfarin PTA for Afib - LD 7/2 PM -INR 2.9. Hgb WNL and stable. - PTA regimen 5 mg/d  Goal of Therapy:  INR 2-3 Monitor platelets by anticoagulation protocol: Yes   Plan:  Warfarin 4 mg daily - slightly reduced since on high end of goal range w/ azithro (d/c'd 7/4). Monitor daily INR   Nickie Warwick S. Alford Highland, PharmD, BCPS Clinical Staff Pharmacist [facility] 01/13/2019,1:37 PM

## 2019-01-15 LAB — PATHOLOGIST SMEAR REVIEW

## 2019-01-16 LAB — CULTURE, BLOOD (ROUTINE X 2)
Culture: NO GROWTH
Culture: NO GROWTH
Special Requests: ADEQUATE

## 2019-01-17 NOTE — Discharge Summary (Signed)
[facility] Discharge Summary   Patient: Gary [name] JXB:[id number]   PCP: Patient, No Pcp Per DOB: 06-13-1976   Date of admission: 01/11/2019   Date of discharge: 01/13/2019     Discharge Diagnoses:  Principal Problem:   Acute on chronic combined systolic and diastolic CHF, NYHA class 3 (HCC) Active Problems:   Chronic atrial fibrillation   Multifocal pneumonia   Hyperglycemia   Admitted From: home Disposition:  Home   Recommendations for Outpatient Follow-up:  1. Please follow up with PCP in 1 week   Diet recommendation: cardiac diet  Activity: The patient is advised to gradually reintroduce usual activities,as tolerated.  Discharge Condition: good  Code Status: full code  History of present illness: As per the H and P dictated on admission, "Gary HurtMark J Graddy is a 43 y.o. 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  Feels that the shortness of breath is been going on for about the past month on and off.  It is worsened over the past couple of days.  He woke up last night had to sit up straight but was able to catch his breath after prolonged period.  Some associated chest pressure but no diaphoresis, nausea, or vomiting.  He has had a mild cough.  He has had no fevers.  He has had no chest congestion.  He has had no production with his cough.  He has had multiple ED visits in the past couple of months and has had recurrent pneumonia.  Multiple courses of antibiotics.  He will get transiently better and then it will recur.  He feels that his symptoms are most likely related to his amiodarone use.  He saw his  cardiologist a couple of days ago felt his symptoms were related to his atrial fibrillation.  The patient has decided to take himself off the amiodarone as he had looked it up and it can cause shortness of breath. Patient denies any history of edema but even when he has had exacerbations of his heart failure he has never had peripheral edema.  Cardiomyopathy is not a classic wanted to related more to a congenital birth defect"  Hospital Course:  Summary of his active problems in the hospital is as following.  Acute on chronic combined systolic and diastolic congestive heart failure Treated with Lasix to 40 IV twice daily. Patient is not on a beta-blocker due to bradycardia.  Chronic atrial fibrillation:  Rete controlled.  Was started on amiodarone and has discontinued its use.  We will continue warfarin to be dosed by pharmacy.  Multifocal pneumonia: patient is COVID negative and do not suspect COVID but he may have another form of viral pneumonia. Negative respiratory viral panel. Transition to Augmentin for now.  Patient was ambulatory without any assistance. On the day of the discharge the patient's vitals were stable, and no other acute medical condition were reported by patient. the patient was felt safe to be discharge at Home with no therapy needed on discharge.  Consultants: cardiology Procedures: Echocardiogram   DISCHARGE MEDICATION: Allergies as of 01/13/2019      Reactions   Aspirin Other (See Comments)   Internal bleeding  Medication List    STOP taking these medications   amiodarone 200 MG tablet Commonly known as: PACERONE     TAKE these medications   furosemide 40 MG tablet Commonly known as: LASIX Take 40 mg twice a day for 3 days, then take 40 mg daily. What changed:   medication strength  how much to take  how to take this  when to take this  additional instructions   losartan 25 MG tablet Commonly known as: COZAAR Take 1 tablet (25  mg total) by mouth daily. What changed: how much to take   Melatonin 2.5 MG Caps Take 1 capsule (2.5 mg total) by mouth at bedtime as needed. What changed: reasons to take this   multivitamin with minerals Tabs tablet Take 1 tablet by mouth daily.   potassium chloride SA 20 MEQ tablet Commonly known as: K-DUR Take 1 tablet (20 mEq total) by mouth daily.   warfarin 5 MG tablet Commonly known as: COUMADIN Take 1 tablet (5 mg total) by mouth daily at 6 PM.     ASK your doctor about these medications   amoxicillin-clavulanate 875-125 MG tablet Commonly known as: AUGMENTIN Take 1 tablet by mouth 2 (two) times daily for 3 days. Ask about: Should I take this medication?      Allergies  Allergen Reactions   Aspirin Other (See Comments)    Internal bleeding    Discharge Instructions    Diet - low sodium heart healthy   Complete by: As directed    Discharge instructions   Complete by: As directed    It is important that you read the given instructions as well as go over your medication list with RN to help you understand your care after this hospitalization.  Discharge Instructions: Please follow-up with PCP in 1-2 weeks  Please request your primary care physician to go over all Hospital Tests and Procedure/Radiological results at the follow up. Please get all Hospital records sent to your PCP by signing hospital release before you go home.   Do not take more than prescribed Pain, Sleep and Anxiety Medications. You were cared for by a hospitalist during your hospital stay. If you have any questions about your discharge medications or the care you received while you were in the hospital after you are discharged, you can call the unit @ you were admitted to and ask to speak with the hospitalist on call if the hospitalist that took care of you is not available.  Once you are discharged, your primary care physician will handle any further medical issues. Please note that NO  REFILLS for any discharge medications will be authorized once you are discharged, as it is imperative that you return to your primary care physician (or establish a relationship with a primary care physician if you do not have one) for your aftercare needs so that they can reassess your need for medications and monitor your lab values. You Must read complete instructions/literature along with all the possible adverse reactions/side effects for all the Medicines you take and that have been prescribed to you. Take any new Medicines after you have completely understood and accept all the possible adverse reactions/side effects. Wear Seat belts while driving. If you have smoked or chewed Tobacco in the last 2 yrs please stop smoking and/or stop any Recreational drug use.  If you drink alcohol, please STOP the use and do not drive, operating heavy machinery, perform activities at heights, swimming or participation in water activities or provide  baby sitting services under influence.   Increase activity slowly   Complete by: As directed      Discharge Exam: Filed Weights   01/11/19 1349 01/12/19 0436 01/13/19 0417  Weight: 50 kg 48.4 kg 48.9 kg   Vitals:   01/13/19 1033 01/13/19 1204  BP: 114/71 91/60  Pulse: (!) 109 75  Resp: 16 18  Temp: 98.2 F (36.8 C) 98.5 F (36.9 C)  SpO2: 97% 94%   General: Appear in no distress, no Rash; Oral Mucosa Clear, moist. no Abnormal Mass Or lumps Cardiovascular: S1 and S2 Present, no Murmur, Respiratory: normal respiratory effort, Bilateral Air entry present and Clear to Auscultation, no Crackles, no wheezes Abdomen: Bowel Sound present, Soft and no tenderness, no hernia Extremities: no Pedal edema, no calf tenderness Neurology: alert and oriented to time, place, and person affect appropriate. normal without focal findings, mental status, speech normal, alert and oriented x3, PERLA, Motor strength 5/5 and symmetric and sensation grossly normal to light touch    The results of significant diagnostics from this hospitalization (including imaging, microbiology, ancillary and laboratory) are listed below for reference.    Significant Diagnostic Studies: Dg Chest 2 View  Result Date: 01/12/2019 CLINICAL DATA:  Follow-up pneumonia EXAM: CHEST - 2 VIEW COMPARISON:  01/11/2019 FINDINGS: Cardiomegaly with right heart enlargement as seen previously. Persistent bilateral interstitial and airspace density, with some worsening in the left mid lung. Findings could represent a combination of edema and pneumonia. No effusions visible on the lateral view. Chronic spinal curvature incidentally noted. IMPRESSION: Persisting cardiomegaly with right heart prominence. Persistent interstitial and alveolar density, with worsening in the left mid lung. Findings could represent a combination of edema and pneumonia. Electronically Signed   By: Paulina Fusi M.D.   On: 01/12/2019 08:19   Dg Chest 2 View  Result Date: 01/11/2019 CLINICAL DATA:  Shortness of breath. EXAM: CHEST - 2 VIEW COMPARISON:  Chest x-ray dated December 27, 2018. FINDINGS: Stable moderate cardiomegaly. New pulmonary vascular congestion with increased interstitial markings peripherally at the lung bases. Increasing opacity in lingula and right lower lobe. No pleural effusion or pneumothorax. No acute osseous abnormality. IMPRESSION: 1. Worsening airspace disease in the lingula and right lower lobe, concerning for multifocal pneumonia. Followup PA and lateral chest X-ray is recommended in 3-4 weeks following trial of antibiotic therapy to ensure resolution. 2. Stable moderate cardiomegaly. New pulmonary vascular congestion and mild interstitial edema. Electronically Signed   By: Obie Dredge M.D.   On: 01/11/2019 08:21   Dg Chest 2 View  Result Date: 12/27/2018 CLINICAL DATA:  Shortness of breath and intermittent chest pain. Atrial fibrillation. EXAM: CHEST - 2 VIEW COMPARISON:  Nov 15, 2018 FINDINGS: Cardiomegaly is  identified. Mild patchy opacity in the right base and lingula or not seen previously. The hila and mediastinum are unremarkable. No other acute abnormalities. IMPRESSION: Mild patchy opacity in the right base and lingula may represent pneumonia or aspiration. Recommend follow-up to resolution. Electronically Signed   By: Gerome Sam III M.D   On: 12/27/2018 14:02   Ct Angio Chest Pe W And/or Wo Contrast  Result Date: 01/11/2019 CLINICAL DATA:  Chest tightness.  Recent pneumonia. EXAM: CT ANGIOGRAPHY CHEST WITH CONTRAST TECHNIQUE: Multidetector CT imaging of the chest was performed using the standard protocol during bolus administration of intravenous contrast. Multiplanar CT image reconstructions and MIPs were obtained to evaluate the vascular anatomy. CONTRAST:  OMNIPAQUE IOHEXOL 350 MG/ML SOLN COMPARISON:  CTA chest dated October 25, 2018.  FINDINGS: Cardiovascular: Satisfactory opacification of the pulmonary arteries to the segmental level. No evidence of pulmonary embolism. 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 Stable cardiomegaly. No pericardial effusion. Prominent reflux of contrast into the intrahepatic IVC and hepatic veins, similar to prior study. No thoracic aortic aneurysm. Dilated left hemiazygous system again noted. Mediastinum/Nodes: New mildly enlarged right paratracheal, subcarinal, and hilar lymph nodes, likely reactive. No enlarged axillary lymph nodes. 2.9 cm left thyroid nodule, mildly enlarged since 2011 when it measured 2.1 cm. The trachea and esophagus demonstrate no significant findings. Lungs/Pleura: Progressive ground-glass consolidation in the right greater than left lower lobes and lingula. Scattered smooth interlobular septal thickening. No pleural effusion or pneumothorax. No suspicious pulmonary nodule. Upper Abdomen: No acute abnormality. Unchanged splenic tissue in the right upper quadrant. Musculoskeletal: No chest wall abnormality. No acute or  significant osseous findings. Review of the MIP images confirms the above findings. IMPRESSION: 1. Progressive ground-glass consolidation in the right greater than left lower lobes and lingula, concerning for multilobar pneumonia. 2. No evidence of pulmonary embolism. Unchanged pulmonary arterial hypertension. 3. Unchanged moderate cardiomegaly with evidence of right heart dysfunction. Mild interstitial pulmonary edema. 4. 2.9 cm left thyroid nodule, mildly enlarged since 2011. Recommend non-emergent thyroid ultrasound for further evaluation. Electronically Signed   By: Obie Dredge M.D.   On: 01/11/2019 11:06    Microbiology: Recent Results (from the past 240 hour(s))  SARS Coronavirus 2 (CEPHEID- Performed in [facility] hospital lab), Hosp Order     Status: None   Collection Time: 01/11/19  9:12 AM   Specimen: Nasopharyngeal Swab  Result Value Ref Range Status   SARS Coronavirus 2 NEGATIVE NEGATIVE Final    Comment: (NOTE) If result is NEGATIVE SARS-CoV-2 target nucleic acids are NOT DETECTED. The SARS-CoV-2 RNA is generally detectable in upper and lower  respiratory specimens during the acute phase of infection. The lowest  concentration of SARS-CoV-2 viral copies this assay can detect is 250  copies / mL. A negative result does not preclude SARS-CoV-2 infection  and should not be used as the sole basis for treatment or other  patient management decisions.  A negative result may occur with  improper specimen collection / handling, submission of specimen other  than nasopharyngeal swab, presence of viral mutation(s) within the  areas targeted by this assay, and inadequate number of viral copies  (<250 copies / mL). A negative result must be combined with clinical  observations, patient history, and epidemiological information. If result is POSITIVE SARS-CoV-2 target nucleic acids are DETECTED. The SARS-CoV-2 RNA is generally detectable in upper and lower  respiratory specimens  dur ing the acute phase of infection.  Positive  results are indicative of active infection with SARS-CoV-2.  Clinical  correlation with patient history and other diagnostic information is  necessary to determine patient infection status.  Positive results do  not rule out bacterial infection or co-infection with other viruses. If result is PRESUMPTIVE POSTIVE SARS-CoV-2 nucleic acids MAY BE PRESENT.   A presumptive positive result was obtained on the submitted specimen  and confirmed on repeat testing.  While 2019 novel coronavirus  (SARS-CoV-2) nucleic acids may be present in the submitted sample  additional confirmatory testing may be necessary for epidemiological  and / or clinical management purposes  to differentiate between  SARS-CoV-2 and other Sarbecovirus currently known to infect humans.  If clinically indicated additional testing with an alternate test  methodology [phone]) is advised. The SARS-CoV-2 RNA is generally  detectable in upper and lower respiratory sp ecimens during the acute  phase of infection. The expected result is Negative. Fact Sheet for Patients:  BoilerBrush.com.cyhttps://www.fda.gov/media/136312/download Fact Sheet for Healthcare Providers: https://pope.com/https://www.fda.gov/media/136313/download This test is not yet approved or cleared by the [place] FDA and has been authorized for detection and/or diagnosis of SARS-CoV-2 by FDA under an Emergency Use Authorization (EUA).  This EUA will remain in effect (meaning this test can be used) for the duration of the COVID-19 declaration under Section 564(b)(1) of the Act, 21 U.S.C. section 360bbb-3(b)(1), unless the authorization is terminated or revoked sooner. Performed at [facility], [address] [place], [place] [address]   Blood culture (routine x 2)     Status: None   Collection Time: 01/11/19 11:35 AM   Specimen: BLOOD  Result Value Ref Range Status   Specimen Description BLOOD BLOOD RIGHT FOREARM  Final    Special Requests   Final    BOTTLES DRAWN AEROBIC AND ANAEROBIC Blood Culture adequate volume   Culture   Final    NO GROWTH 5 DAYS Performed at [facility], [address] [place], [place] [address]    Report Status 01/16/2019 FINAL  Final  Blood culture (routine x 2)     Status: None   Collection Time: 01/11/19 12:07 PM   Specimen: BLOOD  Result Value Ref Range Status   Specimen Description BLOOD LEFT ANTECUBITAL  Final   Special Requests   Final    BOTTLES DRAWN AEROBIC AND ANAEROBIC Blood Culture results may not be optimal due to an excessive volume of blood received in culture bottles   Culture   Final    NO GROWTH 5 DAYS Performed at [facility], [address] [place], [place] [address]    Report Status 01/16/2019 FINAL  Final  Respiratory Panel by PCR     Status: None   Collection Time: 01/11/19  5:15 PM   Specimen: Nasopharyngeal Swab; Respiratory  Result Value Ref Range Status   Adenovirus NOT DETECTED NOT DETECTED Final   Coronavirus 229E NOT DETECTED NOT DETECTED Final    Comment: (NOTE) The Coronavirus on the Respiratory Panel, DOES NOT test for the novel  Coronavirus (2019 nCoV)    Coronavirus HKU1 NOT DETECTED NOT DETECTED Final   Coronavirus NL63 NOT DETECTED NOT DETECTED Final   Coronavirus OC43 NOT DETECTED NOT DETECTED Final   Metapneumovirus NOT DETECTED NOT DETECTED Final   Rhinovirus / Enterovirus NOT DETECTED NOT DETECTED Final   Influenza A NOT DETECTED NOT DETECTED Final   Influenza B NOT DETECTED NOT DETECTED Final   Parainfluenza Virus 1 NOT DETECTED NOT DETECTED Final   Parainfluenza Virus 2 NOT DETECTED NOT DETECTED Final   Parainfluenza Virus 3 NOT DETECTED NOT DETECTED Final   Parainfluenza Virus 4 NOT DETECTED NOT DETECTED Final   Respiratory Syncytial Virus NOT DETECTED NOT DETECTED Final   Bordetella pertussis NOT DETECTED NOT DETECTED Final   Chlamydophila pneumoniae NOT DETECTED NOT DETECTED Final   Mycoplasma pneumoniae  NOT DETECTED NOT DETECTED Final    Comment: Performed at [facility], [address] [place], [place] [address]     Labs: CBC: Recent Labs  Lab 01/11/19 0735 01/12/19 0421 01/13/19 0512  WBC 13.1* 15.5* 15.7*  NEUTROABS 6.9  --   --   HGB 13.4 14.1 14.4  HCT 38.0* 39.6 40.8  MCV 90.9 90.4 90.3  PLT 160 157 157   Basic Metabolic Panel: Recent Labs  Lab 01/11/19 0735 01/12/19 0421 01/13/19 0512  NA 136 138 135  K 4.1 4.0 3.9  CL 105 105 104  CO2 21* 23 23  GLUCOSE 145* 105* 111*  BUN 18 15 14   CREATININE 1.10 1.08 0.88  CALCIUM 9.0 9.0 8.8*   Liver Function Tests: No results for input(s): AST, ALT, ALKPHOS, BILITOT, PROT, ALBUMIN in the last 168 hours. No results for input(s): LIPASE, AMYLASE in the last 168 hours. No results for input(s): AMMONIA in the last 168 hours. Cardiac Enzymes: No results for input(s): CKTOTAL, CKMB, CKMBINDEX, TROPONINI in the last 168 hours. BNP (last 3 results) Recent Labs    10/25/18 0919 10/27/18 0018 01/11/19 0735  BNP 1,055.3* 891.8* 1,038.0*   CBG: No results for input(s): GLUCAP in the last 168 hours. Time spent: 35 minutes  Signed:  Berle Mull  [facility] 01/13/2019

## 2019-01-31 ENCOUNTER — Other Ambulatory Visit: Payer: Self-pay

## 2019-01-31 ENCOUNTER — Encounter: Payer: Self-pay | Admitting: Cardiology

## 2019-01-31 ENCOUNTER — Ambulatory Visit (INDEPENDENT_AMBULATORY_CARE_PROVIDER_SITE_OTHER): Payer: Medicaid Other | Admitting: Cardiology

## 2019-01-31 VITALS — BP 114/60 | HR 69 | Temp 97.7°F | Ht 65.0 in | Wt 113.0 lb

## 2019-01-31 DIAGNOSIS — I4811 Longstanding persistent atrial fibrillation: Secondary | ICD-10-CM

## 2019-01-31 DIAGNOSIS — Z7901 Long term (current) use of anticoagulants: Secondary | ICD-10-CM | POA: Diagnosis not present

## 2019-01-31 DIAGNOSIS — I428 Other cardiomyopathies: Secondary | ICD-10-CM

## 2019-01-31 DIAGNOSIS — R0789 Other chest pain: Secondary | ICD-10-CM | POA: Diagnosis not present

## 2019-01-31 DIAGNOSIS — I340 Nonrheumatic mitral (valve) insufficiency: Secondary | ICD-10-CM

## 2019-01-31 DIAGNOSIS — I4891 Unspecified atrial fibrillation: Secondary | ICD-10-CM | POA: Insufficient documentation

## 2019-01-31 HISTORY — DX: Other cardiomyopathies: I42.8

## 2019-01-31 HISTORY — DX: Nonrheumatic mitral (valve) insufficiency: I34.0

## 2019-01-31 LAB — POCT INR: INR: 1.7 — AB (ref 2.0–3.0)

## 2019-01-31 MED ORDER — FUROSEMIDE 40 MG PO TABS: 40.0000 mg | ORAL_TABLET | Freq: Every day | ORAL | 2 refills | Status: DC

## 2019-01-31 MED ORDER — SACUBITRIL-VALSARTAN 24-26 MG PO TABS: 1.0000 | ORAL_TABLET | Freq: Two times a day (BID) | ORAL | Status: DC

## 2019-01-31 MED ORDER — APIXABAN 2.5 MG PO TABS: 5.0000 mg | ORAL_TABLET | Freq: Two times a day (BID) | ORAL | Status: DC

## 2019-01-31 NOTE — Progress Notes (Signed)
Patient referred by Diamantina Providence, FNP for chest pain, shortness of breath  Subjective:   Gary [name], male    DOB: 05-29-1976, 43 y.o.   MRN: [id number]   Chief Complaint  Patient presents with  . New Patient (Initial Visit)  . Congestive Heart Failure  . Atrial Fibrillation     HPI  43 y.o. African American male with cardiomyopathy, atrial fibrillation  Patient has a complex medical history.  He reportedly underwent an abdominal and open heart surgery before age of 25 at [facility].  According to the patient, the hospital is since closed and he does not have any records from the surgery.  He states that his condition was attributed to his mother's suicide attempt during her pregnancy with him.  Stated, all details are available. 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  For the last 2 years, he has been seen by Dr. Sharyn Lull and Dr. Algie Coffer and wants to change his cardiologist.  He has had 2 transthoracic echocardiograms and 1 TEE in last 18 months.  I personally reviewed these images.  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  While his EF is stated as 45-50%, I think this is over estimation of his ejection fraction.  On all images, EF appears to be no more than 30-35%.  He underwent cardioversion with successful return to sinus rhythm and 2019.  However, since then, he appears to have been in atrial fibrillation.  He is currently on warfarin with INR of 1.7 today.  Patient has had two hospital admissions this year. In 10/2018, he was admitted to [facility] with bradycardia, that was attributed to concurrent use of amiodarone and azithromycin. Amiodarone was thus stopped.  He had multiple ED visits, and then hospital admission in 01/2019 with what was thought to be recurrent  pneumonia-with shortness of breath and cough. Patchy infiltrates were seen in right base on CT chest in 10/2018. Progressive ground-glass consolidation was seen on CT chest in 01/2019 in the right greater than left lower lobes and lingula, concerning for multilobar pneumonia.  Patient currently works in [employer] at [employer] lives with his wife and four daughters. He is able to do most of his daily activities without any difficulty, but does endorse exertional dyspnea with more than usual activity. He denies any leg edema. He has non-lifestyle limiting palpitations.   He used to smoke tobacco and marijuana in the past, but is currently completely sober.    Past Medical History:  Diagnosis Date  . Atrial fibrillation (HCC)   . CHF (congestive heart failure) (HCC)   . Dysrhythmia    hx. of  A-fib  . Former smoker    Quit  . Internal bleeding    DUE TO ASPIRIN, OR MEDS  . Murmur      Past Surgical History:  Procedure Laterality Date  . ABDOMINAL SURGERY    . ABDOMINAL SURGERY     under 10 yrs. old or younger  . CARDIAC SURGERY    . CARDIOVERSION N/A 08/17/2017   Procedure: CARDIOVERSION;  Surgeon: Orpah Cobb, MD;  Location: [facility] ENDOSCOPY;  Service: Cardiovascular;  Laterality: N/A;  . HERNIA REPAIR    . INGUINAL HERNIA REPAIR Right 10/13/2017   Procedure: OPEN RIGHT INGUINAL HERNIA REPAIR ERAS PATHWAY;  Surgeon: Jimmye Norman, MD;  Location: [facility] OR;  Service: General;  Laterality: Right;  . OPEN HEART SURGERY     AS A CHILD , at 2 yrs. old[place]  . TEE WITHOUT CARDIOVERSION N/A 08/17/2017   Procedure: TRANSESOPHAGEAL ECHOCARDIOGRAM (TEE);  Surgeon: Orpah CobbKadakia, Ajay, MD;  [facility] END[facility]COPY;  Service: Cardiovascular;  Laterality: N/A;     Social History   Socioeconomic History  . Marital status: Single    Spouse name: Not on file  . Number of children: Not on file  . Years of education: Not on file  . Highest education level: Not on file   Occupational History  . Not on file  Social Needs  . Financial resource strain: Not on file  . Food insecurity    Worry: Not on file    Inability: Not on file  . Transportation needs    Medical: Not on file    Non-medical: Not on file  Tobacco Use  . Smoking status: Former Smoker    Packs/day: 1.00    Years: 20.00    Pack years: 20.00    Types: Cigarettes  . Smokeless tobacco: Never Used  Substance and Sexual Activity  . Alcohol use: Never    Frequency: Never    Comment: social  . Drug use: Not Currently    Types: Marijuana  . Sexual activity: Not on file  Lifestyle  . Physical activity    Days per week: Not on file    Minutes per session: Not on file  . Stress: Not on file  Relationships  . Social Musicianconnections    Talks on phone: Not on file    Gets together: Not on file    Attends religious service: Not on file    Active member of club or organization: Not on file    Attends meetings of clubs or organizations: Not on file    Relationship status: Not on file  . Intimate partner violence    Fear of current or ex partner: Not on file    Emotionally abused: Not on file    Physically abused: Not on file    Forced sexual activity: Not on file  Other Topics Concern  . Not on file  Social History Narrative  . Not on file     Family History  Problem Relation Age of Onset  . Healthy Father      Current Outpatient Medications on File Prior to Visit  Medication Sig Dispense Refill  . furosemide (LASIX) 40 MG tablet Take 40 mg twice a day for 3 days, then take 40 mg daily. 30 tablet 0  . losartan (COZAAR) 25 MG tablet Take 1 tablet (25 mg total) by mouth daily. (Patient taking differently: Take 12.5 mg by mouth daily. ) 30 tablet 3  . Melatonin 2.5 MG CAPS Take 1 capsule (2.5 mg total) by mouth at bedtime as needed. (Patient taking differently: Take 2.5 mg by mouth at bedtime as needed (sleep). ) 30 capsule 0  . Multiple Vitamin (MULTIVITAMIN WITH MINERALS) TABS tablet  Take 1 tablet by mouth daily.    . potassium chloride SA (K-DUR) 20 MEQ tablet Take 1 tablet (20 mEq total) by mouth daily. 10 tablet 0  . warfarin (COUMADIN) 5 MG tablet Take 1 tablet (5 mg total) by mouth daily at 6 PM. 30 tablet 1   No current facility-administered medications on file prior to visit.     Cardiovascular studies:  Echocardiogram 10/27/2018:  1. The left ventricle has mildly reduced systolic function, with an ejection  fraction of 45-50%. The cavity size was normal. There is mildly increased left ventricular wall thickness. Findings are consistent with Noncompaction cardiomyopathy. Left  ventricular diastolic Doppler parameters are consistent with impaired relaxation. Left ventricular diffuse hypokinesis.  2. The right ventricle has normal systolic function. The cavity was normal. There is no increase in right ventricular wall thickness.  3. Left atrial size was moderately dilated.  4. Mitral valve regurgitation is moderate by color flow Doppler. The MR jet is eccentric laterally directed.  5. Aortic valve regurgitation is mild to moderate by color flow Doppler.   EKG 01/31/2019: Atrial fibrillation with controlled ventricular response  Voltage criteria for LVH  T wave inversion abnormality  -Possible  Inferior and lateral  ischemia.     Recent labs: Results for IMAAD, REUSS (MRN [id number]) as of 01/31/2019 16:06  Ref. Range 01/11/2019 07:35 01/11/2019 09:23 01/12/2019 04:21 01/13/2019 66:44  BASIC METABOLIC PANEL Unknown Rpt (A)  Rpt (A) Rpt (A)  Sodium Latest Ref Range: 135 - 145 mmol/L 136  138 135  Potassium Latest Ref Range: 3.5 - 5.1 mmol/L 4.1  4.0 3.9  Chloride Latest Ref Range: 98 - 111 mmol/L 105  105 104  CO2 Latest Ref Range: 22 - 32 mmol/L 21 (L)  23 23  Glucose Latest Ref Range: 70 - 99 mg/dL 145 (H)  105 (H) 111 (H)  BUN Latest Ref Range: 6 - 20 mg/dL 18  15 14   Creatinine Latest Ref Range: 0.61 - 1.24 mg/dL 1.10  1.08 0.88  Calcium Latest Ref Range: 8.9  - 10.3 mg/dL 9.0  9.0 8.8 (L)  Anion gap Latest Ref Range: 5 - 15  10  10 8   GFR, Est Non African American Latest Ref Range: >60 mL/min >60  >60 >60  GFR, Est African American Latest Ref Range: >60 mL/min >60  >60 >60  B Natriuretic Peptide Latest Ref Range: 0.0 - 100.0 pg/mL 1,038.0 (H)     Troponin I (High Sensitivity) Latest Ref Range: <18 ng/L 83 (H) 80 (H)     Results for RAYSHAD, RIVIELLO (MRN [id number]) as of 01/31/2019 16:06  Ref. Range 01/13/2019 05:12  WBC Latest Ref Range: 4.0 - 10.5 K/uL 15.7 (H)  RBC Latest Ref Range: 4.22 - 5.81 MIL/uL 4.52  Hemoglobin Latest Ref Range: 13.0 - 17.0 g/dL 14.4  HCT Latest Ref Range: 39.0 - 52.0 % 40.8  MCV Latest Ref Range: 80.0 - 100.0 fL 90.3  MCH Latest Ref Range: 26.0 - 34.0 pg 31.9  MCHC Latest Ref Range: 30.0 - 36.0 g/dL 35.3  RDW Latest Ref Range: 11.5 - 15.5 % 11.7  Platelets Latest Ref Range: 150 - 400 K/uL 157  nRBC Latest Ref Range: 0.0 - 0.2 % 0.0     Review of Systems  Constitution: Negative for decreased appetite, malaise/fatigue, weight gain and weight loss.  HENT: Negative for congestion.   Eyes: Negative for visual disturbance.  Cardiovascular: Positive for dyspnea on exertion and palpitations. Negative for chest pain, leg swelling and syncope.  Respiratory: Negative for cough.   Endocrine: Negative for cold intolerance.  Hematologic/Lymphatic: Does not bruise/bleed easily.  Skin: Negative for itching and rash.  Musculoskeletal: Negative for myalgias.  Gastrointestinal: Negative for abdominal pain, nausea and vomiting.  Genitourinary: Negative for dysuria.  Neurological: Negative for dizziness and weakness.  Psychiatric/Behavioral: The patient is not nervous/anxious.   All other systems reviewed and are negative.        Vitals:   01/31/19 1053  BP: 114/60  Pulse: 69  Temp: 97.7 F (36.5 C)  SpO2: 92%     Body mass index is 18.8 kg/m. Filed Weights   01/31/19 1053  Weight: 113 lb (51.3 kg)      Objective:   Physical Exam  Constitutional: He is oriented to person, place, and time. He appears well-developed. No distress.  Small body habitus   HENT:  Head: Normocephalic and atraumatic.  Eyes: Pupils are equal, round, and reactive to light. Conjunctivae are normal.  Neck: JVD (Mild JVD) present.  Cardiovascular: Normal rate and intact distal pulses. An irregularly irregular rhythm present.  Murmur heard. High-pitched blowing holosystolic murmur is present with a grade of 3/6 at the apex. Pulmonary/Chest: Effort normal and breath sounds normal. He has no wheezes. He has no rales.    Abdominal: Soft. Bowel sounds are normal. There is no rebound.    Musculoskeletal:        General: No edema.  Lymphadenopathy:    He has no cervical adenopathy.  Neurological: He is alert and oriented to person, place, and time. No cranial nerve deficit.  Skin: Skin is warm and dry.  Psychiatric: He has a normal mood and affect.  Nursing note and vitals reviewed.         Assessment & Recommendations:   43 y/o [place] with noncompaction cardiomyopathy, persistent atrial fibrillation, h/o abdominal and ?cardiac surgeries before age 89, ? H/o heterotaxy syndrome, here to establish cardiac care.   Noncompaction cardiomyopathy: I have personally reviewed his echocardiogram His EF is 30-35% with typical trabeculation suggesting noncompaction cardiomyopathy. TO my knowledge, he has not had ischemic evaluation, but suspicion of ischemic cardiomyopathy is low. 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 He has NYHA class II-III symptoms. I recommend stopping losartan 12.5 mg daily, and switch to Entresto 24-2 mg bid. He is subtherapeutic on warfain. Anticoagulation is required for both noncompaction cardiomyopathy and persistent atrial fibrillation. Recommend switching to eliquis 5 mg bid.   I anticipate that he may  require advanced heart failure therapy in long term future. I have encouraged him to stay away from any form of substance abuse, as this will impact his chances.  Aortic and mitral regurgitation: Both appear moderate. Will conitnue to monitor  Pulmonary opacity: Differential is wide. I do suspect this may have been atypical pulmonary edema presentation. He will need pulmonary evaluation at some point.   Persistent atrial fibrillation: Rate controlled. I would consider rhythm control after optimizing his heart failure therapy.  CHA2DSVasc score 1 with annual  Stroke risk 0.6%. However, given his compaction cardiomyopathy, his stroke risk is higher. Recommend anticoagulation as above.    Check BMP in 1 week. Follow up visit in 2 weeks.  Total time spent with patient was 60 minutes and greater than 50% of that time was spent in counseling and coordination care with the patient regarding complex decision making and discussion as state above.   Elder NegusManish J Addalyne Vandehei, MD [facility] Pager: [phone] Office: [phone] If no answer Cell [phone]

## 2019-01-31 NOTE — Patient Instructions (Signed)
Heart Failure Eating Plan Heart failure, also called congestive heart failure, occurs when your heart does not pump blood well enough to meet your body's needs for oxygen-rich blood. Heart failure is a long-term (chronic) condition. Living with heart failure can be challenging. However, following your health care provider's instructions about a healthy lifestyle and working with a diet and nutrition specialist (dietitian) to choose the right foods may help to improve your symptoms. What are tips for following this plan? Reading food labels  Check food labels for the amount of sodium per serving. Choose foods that have less than 140 mg (milligrams) of sodium in each serving.  Check food labels for the number of calories per serving. This is important if you need to limit your daily calorie intake to lose weight.  Check food labels for the serving size. If you eat more than one serving, you will be eating more sodium and calories than what is listed on the label.  Look for foods that are labeled as "sodium-free," "very low sodium," or "low sodium." ? Foods labeled as "reduced sodium" or "lightly salted" may still have more sodium than what is recommended for you. Cooking  Avoid adding salt when cooking. Ask your health care provider or dietitian before using salt substitutes.  Season food with salt-free seasonings, spices, or herbs. Check the label of seasoning mixes to make sure they do not contain salt.  Cook with heart-healthy oils, such as olive, canola, soybean, or sunflower oil.  Do not fry foods. Cook foods using low-fat methods, such as baking, boiling, grilling, and broiling.  Limit unhealthy fats when cooking by: ? Removing the skin from poultry, such as chicken. ? Removing all visible fats from meats. ? Skimming the fat off from stews, soups, and gravies before serving them. Meal planning   Limit your intake of: ? Processed, canned, or pre-packaged foods. ? Foods that are  high in trans fat, such as fried foods. ? Sweets, desserts, sugary drinks, and other foods with added sugar. ? Full-fat dairy products, such as whole milk.  Eat a balanced diet that includes: ? 4-5 servings of fruit each day and 4-5 servings of vegetables each day. At each meal, try to fill half of your plate with fruits and vegetables. ? Up to 6-8 servings of whole grains each day. ? Up to 2 servings of lean meat, poultry, or fish each day. One serving of meat is equal to 3 oz. This is about the same size as a deck of cards. ? 2 servings of low-fat dairy each day. ? Heart-healthy fats. Healthy fats called omega-3 fatty acids are found in foods such as flaxseed and cold-water fish like sardines, salmon, and mackerel.  Aim to eat 25-35 g (grams) of fiber a day. Foods that are high in fiber include apples, broccoli, carrots, beans, peas, and whole grains.  Do not add salt or condiments that contain salt (such as soy sauce) to foods before eating.  When eating at a restaurant, ask that your food be prepared with less salt or no salt, if possible.  Try to eat 2 or more vegetarian meals each week.  Eat more home-cooked food and eat less restaurant, buffet, and fast food. General information  Do not eat more than 2,300 mg of salt (sodium) a day. The amount of sodium that is recommended for you may be lower, depending on your condition.  Maintain a healthy body weight as directed. Ask your health care provider what a healthy weight is   for you. ? Check your weight every day. ? Work with your health care provider and dietitian to make a plan that is right for you to lose weight or maintain your current weight.  Limit how much fluid you drink. Ask your health care provider or dietitian how much fluid you can have each day.  Limit or avoid alcohol as told by your health care provider or dietitian. Recommended foods The items listed may not be a complete list. Talk with your dietitian about what  dietary choices are best for you. Fruits All fresh, frozen, and canned fruits. Dried fruits, such as raisins, prunes, and cranberries. Vegetables All fresh vegetables. Vegetables that are frozen without sauce or added salt. Low-sodium or sodium-free canned vegetables. Grains Bread with less than 80 mg of sodium per slice. Whole-wheat pasta, quinoa, and brown rice. Oats and oatmeal. Barley. Millet. Grits and cream of wheat. Whole-grain and whole-wheat cold cereal. Meats and other protein foods Lean cuts of meat. Skinless chicken and turkey. Fish with high omega-3 fatty acids, such as salmon, sardines, and other cold-water fishes. Eggs. Dried beans, peas, and edamame. Unsalted nuts and nut butters. Dairy Low-fat or nonfat (skim) milk and dried milk. Rice milk, soy milk, and almond milk. Low-fat or nonfat yogurt. Small amounts of reduced-sodium block cheese. Low-sodium cottage cheese. Fats and oils Olive, canola, soybean, flaxseed, or sunflower oil. Avocado. Sweets and desserts Apple sauce. Granola bars. Sugar-free pudding and gelatin. Frozen fruit bars. Seasoning and other foods Fresh and dried herbs. Lemon or lime juice. Vinegar. Low-sodium ketchup. Salt-free marinades, salad dressings, sauces, and seasonings. The items listed above may not be a complete list of foods and beverages you can eat. Contact a dietitian for more information. Foods to avoid The items listed may not be a complete list. Talk with your dietitian about what dietary choices are best for you. Fruits Fruits that are dried with sodium-containing preservatives. Vegetables Canned vegetables. Frozen vegetables with sauce or seasonings. Creamed vegetables. French fries. Onion rings. Pickled vegetables and sauerkraut. Grains Bread with more than 80 mg of sodium per slice. Hot or cold cereal with more than 140 mg sodium per serving. Salted pretzels and crackers. Pre-packaged breadcrumbs. Bagels, croissants, and biscuits. Meats  and other protein foods Ribs and chicken wings. Bacon, ham, pepperoni, bologna, salami, and packaged luncheon meats. Hot dogs, bratwurst, and sausage. Canned meat. Smoked meat and fish. Salted nuts and seeds. Dairy Whole milk, half-and-half, and cream. Buttermilk. Processed cheese, cheese spreads, and cheese curds. Regular cottage cheese. Feta cheese. Shredded cheese. String cheese. Fats and oils Butter, lard, shortening, ghee, and bacon fat. Canned and packaged gravies. Seasoning and other foods Onion salt, garlic salt, table salt, and sea salt. Marinades. Regular salad dressings. Relishes, pickles, and olives. Meat flavorings and tenderizers, and bouillon cubes. Horseradish, ketchup, and mustard. Worcestershire sauce. Teriyaki sauce, soy sauce (including reduced sodium). Hot sauce and Tabasco sauce. Steak sauce, fish sauce, oyster sauce, and cocktail sauce. Taco seasonings. Barbecue sauce. Tartar sauce. The items listed above may not be a complete list of foods and beverages you should avoid. Contact a dietitian for more information. Summary  A heart failure eating plan includes changes that limit your intake of sodium and unhealthy fat, and it may help you lose weight or maintain a healthy weight. Your health care provider may also recommend limiting how much fluid you drink.  Most people with heart failure should eat no more than 2,300 mg of salt (sodium) a day. The amount of sodium   that is recommended for you may be lower, depending on your condition.  Contact your health care provider or dietitian before making any major changes to your diet. This information is not intended to replace advice given to you by your health care provider. Make sure you discuss any questions you have with your health care provider. Document Released: 11/11/2016 Document Revised: 08/23/2018 Document Reviewed: 11/11/2016 Elsevier Patient Education  2020 [employer]  

## 2019-02-07 ENCOUNTER — Other Ambulatory Visit: Payer: Self-pay

## 2019-02-07 DIAGNOSIS — I482 Chronic atrial fibrillation, unspecified: Secondary | ICD-10-CM

## 2019-02-07 MED ORDER — APIXABAN 5 MG PO TABS: 5.0000 mg | ORAL_TABLET | Freq: Two times a day (BID) | ORAL | 3 refills | Status: DC

## 2019-02-08 LAB — BASIC METABOLIC PANEL
BUN/Creatinine Ratio: 20 (ref 9–20)
BUN: 22 mg/dL (ref 6–24)
CO2: 19 mmol/L — ABNORMAL LOW (ref 20–29)
Calcium: 8.7 mg/dL (ref 8.7–10.2)
Chloride: 101 mmol/L (ref 96–106)
Creatinine, Ser: 1.1 mg/dL (ref 0.76–1.27)
GFR calc Af Amer: 94 mL/min/{1.73_m2} (ref >59)
GFR calc non Af Amer: 81 mL/min/{1.73_m2} (ref >59)
Glucose: 110 mg/dL — ABNORMAL HIGH (ref 65–99)
Potassium: 4.2 mmol/L (ref 3.5–5.2)
Sodium: 134 mmol/L (ref 134–144)

## 2019-02-13 ENCOUNTER — Encounter: Payer: Self-pay | Admitting: Cardiology

## 2019-02-14 ENCOUNTER — Encounter: Payer: Self-pay | Admitting: Cardiology

## 2019-02-14 ENCOUNTER — Ambulatory Visit (INDEPENDENT_AMBULATORY_CARE_PROVIDER_SITE_OTHER): Payer: Medicaid Other | Admitting: Cardiology

## 2019-02-14 ENCOUNTER — Other Ambulatory Visit: Payer: Self-pay

## 2019-02-14 VITALS — BP 88/32 | HR 76 | Temp 98.4°F | Ht 65.0 in | Wt 115.0 lb

## 2019-02-14 DIAGNOSIS — I428 Other cardiomyopathies: Secondary | ICD-10-CM

## 2019-02-14 DIAGNOSIS — I4811 Longstanding persistent atrial fibrillation: Secondary | ICD-10-CM

## 2019-02-14 DIAGNOSIS — I340 Nonrheumatic mitral (valve) insufficiency: Secondary | ICD-10-CM

## 2019-02-14 NOTE — Progress Notes (Signed)
Patient referred by [name], FNP for chest pain, shortness of breath  Subjective:   Gary [name], male    DOB: 04-27-1976, 43 y.o.   MRN: [id number]   Chief Complaint  Patient presents with   Cardiomyopathy    needs refill on entresto    HPI  43 y/o African American with noncompaction cardiomyopathy, persistent atrial fibrillation, h/o abdominal and ?cardiac surgeries before age 63, ? H/o heterotaxy syndrome.  At last visit, I started the patient on Entresto 24-26 mg bid, and switched from warfarin to Eliquis. He has not had any significant shortness of breath, leg edema symptoms. BP is low today, but he denies any lightheadedness, dizziness symptoms. He is currently using lasix 40-80 mg every day.    Past Medical History:  Diagnosis Date   Atrial fibrillation (HCC)    CHF (congestive heart failure) (HCC)    Dysrhythmia    hx. of  A-fib   Former smoker    Quit   Internal bleeding    DUE TO ASPIRIN, OR MEDS   Murmur      Past Surgical History:  Procedure Laterality Date   ABDOMINAL SURGERY     ABDOMINAL SURGERY     under 10 yrs. old or younger   [place] N/A 08/17/2017   Procedure: CARDIOVERSION;  Surgeon: Dixie Dials, MD;  Location: [place];  Service: Cardiovascular;  Laterality: N/A;   HERNIA REPAIR     INGUINAL HERNIA REPAIR Right 10/13/2017   Procedure: [employer];  Surgeon: Judeth Horn, MD;  Location: [place];  Service: General;  Laterality: Right;   OPEN HEART SURGERY     AS A CHILD , at 2 yrs. old. done in [facility]   [place] WITHOUT CARDIOVERSION N/A 08/17/2017   Procedure: TRANSESOPHAGEAL ECHOCARDIOGRAM (TEE);  Surgeon: Dixie Dials, MD;  Location: [facility] ENDOSCOPY;  Service: Cardiovascular;  Laterality: N/A;     Social History   Socioeconomic History   Marital status: Single    Spouse name: Not on file   Number of children: 8   Years of education: Not on file    Highest education level: Not on file  Occupational History   Not on file  Social Needs   Financial resource strain: Not on file   Food insecurity    Worry: Not on file    Inability: Not on file   Transportation needs    Medical: Not on file    Non-medical: Not on file  Tobacco Use   Smoking status: Former Smoker    Packs/day: 1.00    Years: 20.00    Pack years: 20.00    Types: Cigarettes   Smokeless tobacco: Never Used  Substance and Sexual Activity   Alcohol use: Never    Frequency: Never    Comment: social   Drug use: Not Currently    Types: Marijuana   Sexual activity: Not on file  Lifestyle   Physical activity    Days per week: Not on file    Minutes per session: Not on file   Stress: Not on file  Relationships   Social connections    Talks on phone: Not on file    Gets together: Not on file    Attends religious service: Not on file    Active member of club or organization: Not on file    Attends meetings of clubs or organizations: Not on file    Relationship status:  Not on file   Intimate partner violence    Fear of current or ex partner: Not on file    Emotionally abused: Not on file    Physically abused: Not on file    Forced sexual activity: Not on file  Other Topics Concern   Not on file  Social History Narrative   Not on file     Family History  Problem Relation Age of Onset   Sarcoidosis Mother    Healthy Father      Current Outpatient Medications on File Prior to Visit  Medication Sig Dispense Refill   apixaban (ELIQUIS) 5 MG TABS tablet Take 1 tablet (5 mg total) by mouth 2 (two) times daily. 60 tablet 3   furosemide (LASIX) 40 MG tablet Take 1 tablet (40 mg total) by mouth daily. Take 40 mg twice a day for 3 days, then take 40 mg daily. 90 tablet 2   Melatonin 2.5 MG CAPS Take 1 capsule (2.5 mg total) by mouth at bedtime as needed. (Patient taking differently: Take 2.5 mg by mouth at bedtime as needed (sleep). ) 30 capsule  0   Current Facility-Administered Medications on File Prior to Visit  Medication Dose Route Frequency Provider Last Rate Last Dose   apixaban (ELIQUIS) tablet 5 mg  5 mg Oral BID Eesa Justiss J, MD       sacubitril-valsartan (ENTRESTO) 24-26 mg per tablet  1 tablet Oral BID Elder Negus, MD        Cardiovascular studies:  Echocardiogram 10/27/2018:  1. The left ventricle has mildly reduced systolic function, with an ejection fraction of 45-50%. The cavity size was normal. There is mildly increased left ventricular wall thickness. Findings are consistent with Noncompaction cardiomyopathy. Left  ventricular diastolic Doppler parameters are consistent with impaired relaxation. Left ventricular diffuse hypokinesis.  2. The right ventricle has normal systolic function. The cavity was normal. There is no increase in right ventricular wall thickness.  3. Left atrial size was moderately dilated.  4. Mitral valve regurgitation is moderate by color flow Doppler. The MR jet is eccentric laterally directed.  5. Aortic valve regurgitation is mild to moderate by color flow Doppler.   EKG 01/31/2019: Atrial fibrillation with controlled ventricular response  Voltage criteria for LVH  T wave inversion abnormality  -Possible  Inferior and lateral  ischemia.     Recent labs: Results for NEO, TARWATER (MRN [id number]) as of 02/14/2019 15:20  Ref. Range 01/12/2019 04:21 01/13/2019 05:12 02/07/2019 09:29  BASIC METABOLIC PANEL Unknown Rpt (A) Rpt (A) Rpt (A)  Sodium Latest Ref Range: 134 - 144 mmol/L 138 135 134  Potassium Latest Ref Range: 3.5 - 5.2 mmol/L 4.0 3.9 4.2  Chloride Latest Ref Range: 96 - 106 mmol/L 105 104 101  CO2 Latest Ref Range: 20 - 29 mmol/L 23 23 19  (L)  Glucose Latest Ref Range: 65 - 99 mg/dL 031 (H) 594 (H) 585 (H)  BUN Latest Ref Range: 6 - 24 mg/dL 15 14 22   Creatinine Latest Ref Range: 0.76 - 1.27 mg/dL 9.29 2.44 6.28  Calcium Latest Ref Range: 8.7 - 10.2 mg/dL  9.0 8.8 (L) 8.7  Anion gap Latest Ref Range: 5 - 15  10 8    BUN/Creatinine Ratio Latest Ref Range: 9 - 20    20  GFR, Est Non African American Latest Ref Range: >59 mL/min/1.73 >60 >60 81  GFR, Est African American Latest Ref Range: >59 mL/min/1.73 >60 >60 94     Review of  Systems  Constitution: Negative for decreased appetite, malaise/fatigue, weight gain and weight loss.  HENT: Negative for congestion.   Eyes: Negative for visual disturbance.  Cardiovascular: Positive for dyspnea on exertion (Stable) and palpitations (Stable). Negative for chest pain, leg swelling and syncope.  Respiratory: Negative for cough.   Endocrine: Negative for cold intolerance.  Hematologic/Lymphatic: Does not bruise/bleed easily.  Skin: Negative for itching and rash.  Musculoskeletal: Negative for myalgias.  Gastrointestinal: Negative for abdominal pain, nausea and vomiting.  Genitourinary: Negative for dysuria.  Neurological: Negative for dizziness and weakness.  Psychiatric/Behavioral: The patient is not nervous/anxious.   All other systems reviewed and are negative.        Vitals:   02/14/19 1512  BP: (!) 76/46  Pulse: 79  Temp: 98.4 F (36.9 C)  SpO2: 91%     Body mass index is 19.14 kg/m. Filed Weights   02/14/19 1512  Weight: 115 lb (52.2 kg)     Objective:   Physical Exam  Constitutional: He is oriented to person, place, and time. He appears well-developed. No distress.  Small body habitus   HENT:  Head: Normocephalic and atraumatic.  Eyes: Pupils are equal, round, and reactive to light. Conjunctivae are normal.  Neck: No JVD (Mild JVD) present.  Cardiovascular: Normal rate and intact distal pulses. An irregularly irregular rhythm present.  Murmur heard. High-pitched blowing holosystolic murmur is present with a grade of 3/6 at the apex. Pulmonary/Chest: Effort normal and breath sounds normal. He has no wheezes. He has no rales.  Abdominal: Soft. Bowel sounds are normal.  There is no rebound.    Musculoskeletal:        General: No edema.  Lymphadenopathy:    He has no cervical adenopathy.  Neurological: He is alert and oriented to person, place, and time. No cranial nerve deficit.  Skin: Skin is warm and dry.  Psychiatric: He has a normal mood and affect.  Nursing note and vitals reviewed.         Assessment & Recommendations:   43 y/o [place] with noncompaction cardiomyopathy, persistent atrial fibrillation, h/o abdominal and ?cardiac surgeries before age 89, ? H/o heterotaxy syndrome, here to establish cardiac care.   Noncompaction cardiomyopathy: I have personally reviewed his echocardiogram His EF is 30-35% with typical trabeculation suggesting noncompaction cardiomyopathy. TO my knowledge, he has not had ischemic evaluation, but suspicion of ischemic cardiomyopathy is low. 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 He has stable NYHA class II-III symptoms. Due to low blood pressure, recommend holding Entresto. Encourage hydration. Reduce lasix to 40 mg daily. I will see him back in 2 weeks and reassess if Sherryll Burgerntresto can be resumed.   Aortic and mitral regurgitation: Both appear moderate. Will conitnue to monitor  Pulmonary opacity: Differential is wide. I do suspect this may have been atypical pulmonary edema presentation. He will need pulmonary evaluation at some point.   Persistent atrial fibrillation: Rate controlled. I would consider rhythm control after optimizing his heart failure therapy.  CHA2DSVasc score 1 with annual  Stroke risk 0.6%. However, given his compaction cardiomyopathy, his stroke risk is higher. Recommend anticoagulation as above.    Elder NegusManish J Sumayyah Custodio, MD [facility] Pager: [phone] Office: [phone] If no answer Cell [phone]

## 2019-02-17 ENCOUNTER — Encounter (HOSPITAL_COMMUNITY): Payer: Self-pay

## 2019-02-17 ENCOUNTER — Emergency Department (HOSPITAL_COMMUNITY): Payer: Medicaid Other

## 2019-02-17 ENCOUNTER — Other Ambulatory Visit: Payer: Self-pay

## 2019-02-17 ENCOUNTER — Inpatient Hospital Stay (HOSPITAL_COMMUNITY)
Admission: EM | Admit: 2019-02-17 | Discharge: 2019-02-21 | DRG: 286 | Disposition: A | Discharge status: Home / Self Care | Payer: Medicaid Other | Attending: Cardiology | Admitting: Cardiology

## 2019-02-17 DIAGNOSIS — I4821 Permanent atrial fibrillation: Secondary | ICD-10-CM | POA: Diagnosis present

## 2019-02-17 DIAGNOSIS — I083 Combined rheumatic disorders of mitral, aortic and tricuspid valves: Secondary | ICD-10-CM | POA: Diagnosis present

## 2019-02-17 DIAGNOSIS — Z720 Tobacco use: Secondary | ICD-10-CM | POA: Diagnosis not present

## 2019-02-17 DIAGNOSIS — N179 Acute kidney failure, unspecified: Secondary | ICD-10-CM | POA: Diagnosis not present

## 2019-02-17 DIAGNOSIS — Z886 Allergy status to analgesic agent status: Secondary | ICD-10-CM

## 2019-02-17 DIAGNOSIS — Z8774 Personal history of (corrected) congenital malformations of heart and circulatory system: Secondary | ICD-10-CM

## 2019-02-17 DIAGNOSIS — I420 Dilated cardiomyopathy: Secondary | ICD-10-CM | POA: Diagnosis present

## 2019-02-17 DIAGNOSIS — Z23 Encounter for immunization: Secondary | ICD-10-CM | POA: Diagnosis not present

## 2019-02-17 DIAGNOSIS — I340 Nonrheumatic mitral (valve) insufficiency: Secondary | ICD-10-CM | POA: Diagnosis not present

## 2019-02-17 DIAGNOSIS — Z7901 Long term (current) use of anticoagulants: Secondary | ICD-10-CM | POA: Diagnosis not present

## 2019-02-17 DIAGNOSIS — I9589 Other hypotension: Secondary | ICD-10-CM | POA: Diagnosis not present

## 2019-02-17 DIAGNOSIS — I424 Endocardial fibroelastosis: Secondary | ICD-10-CM | POA: Diagnosis present

## 2019-02-17 DIAGNOSIS — I428 Other cardiomyopathies: Secondary | ICD-10-CM | POA: Diagnosis not present

## 2019-02-17 DIAGNOSIS — Z681 Body mass index (BMI) 19 or less, adult: Secondary | ICD-10-CM | POA: Diagnosis not present

## 2019-02-17 DIAGNOSIS — Z20828 Contact with and (suspected) exposure to other viral communicable diseases: Secondary | ICD-10-CM | POA: Diagnosis present

## 2019-02-17 DIAGNOSIS — I4811 Longstanding persistent atrial fibrillation: Secondary | ICD-10-CM | POA: Diagnosis not present

## 2019-02-17 DIAGNOSIS — I509 Heart failure, unspecified: Secondary | ICD-10-CM

## 2019-02-17 DIAGNOSIS — E43 Unspecified severe protein-calorie malnutrition: Secondary | ICD-10-CM | POA: Diagnosis present

## 2019-02-17 DIAGNOSIS — I5023 Acute on chronic systolic (congestive) heart failure: Secondary | ICD-10-CM | POA: Diagnosis not present

## 2019-02-17 DIAGNOSIS — R0902 Hypoxemia: Secondary | ICD-10-CM | POA: Diagnosis present

## 2019-02-17 DIAGNOSIS — Z87891 Personal history of nicotine dependence: Secondary | ICD-10-CM | POA: Diagnosis not present

## 2019-02-17 DIAGNOSIS — I5043 Acute on chronic combined systolic (congestive) and diastolic (congestive) heart failure: Secondary | ICD-10-CM | POA: Diagnosis not present

## 2019-02-17 DIAGNOSIS — R918 Other nonspecific abnormal finding of lung field: Secondary | ICD-10-CM | POA: Diagnosis present

## 2019-02-17 DIAGNOSIS — Z79890 Hormone replacement therapy (postmenopausal): Secondary | ICD-10-CM | POA: Diagnosis not present

## 2019-02-17 DIAGNOSIS — Z79899 Other long term (current) drug therapy: Secondary | ICD-10-CM

## 2019-02-17 HISTORY — DX: Acute on chronic combined systolic (congestive) and diastolic (congestive) heart failure: I50.43

## 2019-02-17 LAB — CBC
HCT: 35.1 % — ABNORMAL LOW (ref 39.0–52.0)
Hemoglobin: 12.2 g/dL — ABNORMAL LOW (ref 13.0–17.0)
MCH: 31.9 pg (ref 26.0–34.0)
MCHC: 34.8 g/dL (ref 30.0–36.0)
MCV: 91.6 fL (ref 80.0–100.0)
Platelets: 148 10*3/uL — ABNORMAL LOW (ref 150–400)
RBC: 3.83 MIL/uL — ABNORMAL LOW (ref 4.22–5.81)
RDW: 11.8 % (ref 11.5–15.5)
WBC: 9.1 10*3/uL (ref 4.0–10.5)
nRBC: 0 % (ref 0.0–0.2)

## 2019-02-17 LAB — BASIC METABOLIC PANEL
Anion gap: 10 (ref 5–15)
BUN: 18 mg/dL (ref 6–20)
CO2: 21 mmol/L — ABNORMAL LOW (ref 22–32)
Calcium: 8.8 mg/dL — ABNORMAL LOW (ref 8.9–10.3)
Chloride: 104 mmol/L (ref 98–111)
Creatinine, Ser: 1.09 mg/dL (ref 0.61–1.24)
GFR calc Af Amer: >60 mL/min (ref >60)
GFR calc non Af Amer: >60 mL/min (ref >60)
Glucose, Bld: 99 mg/dL (ref 70–99)
Potassium: 3.8 mmol/L (ref 3.5–5.1)
Sodium: 135 mmol/L (ref 135–145)

## 2019-02-17 LAB — SARS CORONAVIRUS 2 (TAT 6-24 HRS): SARS Coronavirus 2: NEGATIVE

## 2019-02-17 LAB — BRAIN NATRIURETIC PEPTIDE: B Natriuretic Peptide: 1806.6 pg/mL — ABNORMAL HIGH (ref 0.0–100.0)

## 2019-02-17 MED ORDER — ONDANSETRON HCL 4 MG/2ML IJ SOLN: 4.0000 mg | Freq: Four times a day (QID) | INTRAMUSCULAR | Status: DC | PRN

## 2019-02-17 MED ORDER — CARVEDILOL 3.125 MG PO TABS
1.5600 mg | ORAL_TABLET | Freq: Two times a day (BID) | ORAL | Status: DC
  Administered 2019-02-17: 1.56 mg via ORAL
  Filled 2019-02-17: qty 0.5
  Filled 2019-02-17: qty 1
  Filled 2019-02-17: qty 0.5

## 2019-02-17 MED ORDER — SODIUM CHLORIDE 0.9% FLUSH
3.0000 mL | Freq: Two times a day (BID) | INTRAVENOUS | Status: DC
  Administered 2019-02-17 – 2019-02-21 (×6): 3 mL via INTRAVENOUS

## 2019-02-17 MED ORDER — POTASSIUM CHLORIDE CRYS ER 10 MEQ PO TBCR
10.0000 meq | EXTENDED_RELEASE_TABLET | Freq: Three times a day (TID) | ORAL | Status: AC
  Administered 2019-02-17 – 2019-02-19 (×6): 10 meq via ORAL
  Filled 2019-02-17 (×6): qty 1

## 2019-02-17 MED ORDER — SODIUM CHLORIDE 0.9% FLUSH
3.0000 mL | Freq: Once | INTRAVENOUS | Status: AC
  Administered 2019-02-17: 3 mL via INTRAVENOUS

## 2019-02-17 MED ORDER — ACETAMINOPHEN 325 MG PO TABS
650.0000 mg | ORAL_TABLET | ORAL | Status: DC | PRN
  Administered 2019-02-19 – 2019-02-21 (×3): 650 mg via ORAL
  Filled 2019-02-17 (×3): qty 2

## 2019-02-17 MED ORDER — SODIUM CHLORIDE 0.9% FLUSH: 3.0000 mL | INTRAVENOUS | Status: DC | PRN

## 2019-02-17 MED ORDER — DIGOXIN 125 MCG PO TABS
0.2500 mg | ORAL_TABLET | Freq: Every day | ORAL | Status: DC
  Administered 2019-02-17 – 2019-02-18 (×2): 0.25 mg via ORAL
  Filled 2019-02-17 (×2): qty 2

## 2019-02-17 MED ORDER — ACETAMINOPHEN 325 MG PO TABS
650.0000 mg | ORAL_TABLET | Freq: Once | ORAL | Status: AC
  Administered 2019-02-17: 650 mg via ORAL
  Filled 2019-02-17: qty 2

## 2019-02-17 MED ORDER — FUROSEMIDE 10 MG/ML IJ SOLN
20.0000 mg | Freq: Three times a day (TID) | INTRAMUSCULAR | Status: AC
  Administered 2019-02-17 – 2019-02-18 (×4): 20 mg via INTRAVENOUS
  Filled 2019-02-17 (×4): qty 2

## 2019-02-17 MED ORDER — SODIUM CHLORIDE 0.9 % IV SOLN: 250.0000 mL | INTRAVENOUS | Status: DC | PRN

## 2019-02-17 NOTE — ED Notes (Signed)
Patient transported to X-ray 

## 2019-02-17 NOTE — ED Provider Notes (Signed)
[facility] Provider Note   CSN: [id number] Arrival date & time: 02/17/19  [id number]     History   Chief Complaint Chief Complaint  Patient presents with  . Palpitations    HPI Gary [name] is a 43 y.o. male.     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  Patient was discharged approximately a month ago with concern for multifocal pneumonia but negative viral cultures and no fever or white count.  Patient states that he saw his cardiologist last week and [facility] because of blood pressures in the 70s systolic even though he felt like it was making him feel better.  Also patient states that at his appointment a week ago he was told to decrease his Lasix to 20 mg daily but he has still been taking 40 intermittently.  In the last 2 or 3 days he is noticed more fatigue, exertional dyspnea and last night even had some orthopnea.  He has had he reports a wet cough but no sputum production or fever.  He denies any swelling of his legs or abdomen.  He does not check his weight regularly.  He also has been feeling his heart intermittently racing.  He states when he was on the [place] he felt this less.  Prior to that he was on amiodarone but stopped that approximately 1 month ago because he thought it was making his lung issues worse.  Patient follows with Dr. Celso AmyPatwarda who is been managing him from a cardiology standpoint.  Based on the note they had planned on referring him to pulmonology as well.  Patient also reports he no longer smokes cigarettes.  The history is provided by the patient.  Palpitations Palpitations quality:  Fast Onset quality:  Insidious Timing:  Intermittent Progression:  Waxing and waning Chronicity:  Recurrent Worsened by:  Nothing Associated  symptoms: cough and shortness of breath   Associated symptoms: no back pain and no chest pain   Associated symptoms comment:  Nonproductive cough.  No fever, swelling or chest pain.   Past Medical History:  Diagnosis Date  . Atrial fibrillation (HCC)   . CHF (congestive heart failure) (HCC)   . Dysrhythmia    hx. of  A-fib  . Former smoker    Quit  . Internal bleeding    DUE TO ASPIRIN, OR MEDS  . Murmur     Patient Active Problem List   Diagnosis Date Noted  . Atrial fibrillation (HCC) 01/31/2019  . 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 Moderate mitral regurgitation 01/31/2019  . Acute on chronic combined systolic and diastolic CHF, NYHA class 3 (HCC) 01/11/2019  . Multifocal pneumonia 01/11/2019  . Hyperglycemia 01/11/2019  . Former smoker   . Chronic atrial fibrillation 10/27/2018  . Protein-calorie malnutrition, severe 10/17/2017  . Incarcerated inguinal hernia 10/13/2017  . Acute congestive heart failure (HCC) 08/14/2017    Past Surgical History:  Procedure Laterality Date  . ABDOMINAL SURGERY    . ABDOMINAL SURGERY     under 10 yrs. old or younger  . CARDIAC SURGERY    . CARDIOVERSION N/A 08/17/2017   Procedure: CARDIOVERSION;  Surgeon: Orpah CobbKadakia, Ajay, MD;  [facility];  Service: Cardiovascular;  Laterality: N/A;  . HERNIA REPAIR    . INGUINAL HERNIA REPAIR Right 10/13/2017   Procedure: OPEN  RIGHT INGUINAL HERNIA REPAIR ERAS PATHWAY;  Surgeon: Judeth Horn, MD;  Location: [place];  Service: General;  Laterality: Right;  . OPEN HEART SURGERY     AS A CHILD , at 2 yrs. old. done in [place]  . TEE WITHOUT CARDIOVERSION N/A 08/17/2017   Procedure: TRANSESOPHAGEAL ECHOCARDIOGRAM (TEE);  Surgeon: Dixie Dials, MD;  Location: [facility] ENDOSCOPY;  Service: Cardiovascular;  Laterality: N/A;        Home Medications    Prior to Admission medications   Medication Sig Start Date End Date Taking? Authorizing Provider  apixaban (ELIQUIS) 5 MG TABS tablet Take  1 tablet (5 mg total) by mouth 2 (two) times daily. 02/07/19   Patwardhan, Reynold Bowen, MD  furosemide (LASIX) 40 MG tablet Take 1 tablet (40 mg total) by mouth daily. Take 40 mg twice a day for 3 days, then take 40 mg daily. 01/31/19   Patwardhan, Reynold Bowen, MD  Melatonin 2.5 MG CAPS Take 1 capsule (2.5 mg total) by mouth at bedtime as needed. Patient taking differently: Take 2.5 mg by mouth at bedtime as needed (sleep).  12/07/18   Zigmund Gottron, NP    Family History Family History  Problem Relation Age of Onset  . Sarcoidosis Mother   . Healthy Father     Social History Social History   Tobacco Use  . Smoking status: Former Smoker    Packs/day: 1.00    Years: 20.00    Pack years: 20.00    Types: Cigarettes  . Smokeless tobacco: Never Used  Substance Use Topics  . Alcohol use: Never    Frequency: Never    Comment: social  . Drug use: Not Currently    Types: Marijuana     Allergies   Aspirin   Review of Systems Review of Systems  Respiratory: Positive for cough and shortness of breath.   Cardiovascular: Positive for palpitations. Negative for chest pain.  Musculoskeletal: Negative for back pain.  All other systems reviewed and are negative.    Physical Exam Updated Vital Signs BP 102/73 (BP Location: Right Arm)   Pulse 81   Temp 98.9 F (37.2 C) (Oral)   Resp 20   SpO2 94%   Physical Exam Vitals signs and nursing note reviewed.  Constitutional:      General: He is not in acute distress.    Appearance: He is well-developed and normal weight.  HENT:     Head: Normocephalic and atraumatic.  Eyes:     Conjunctiva/sclera: Conjunctivae normal.     Pupils: Pupils are equal, round, and reactive to light.  Neck:     Musculoskeletal: Normal range of motion and neck supple.  Cardiovascular:     Rate and Rhythm: Normal rate. Rhythm irregular.     Pulses: Normal pulses.     Heart sounds: Murmur present. Systolic murmur present. Diastolic murmur present.      Comments: Intermittently tachycardic.  Irregularly irregular Pulmonary:     Effort: Pulmonary effort is normal. Tachypnea present. No respiratory distress.     Breath sounds: Examination of the left-lower field reveals rales. Rales present. No wheezing.  Abdominal:     General: There is no distension.     Palpations: Abdomen is soft.     Tenderness: There is no abdominal tenderness. There is no guarding or rebound.  Musculoskeletal: Normal range of motion.        General: No tenderness.     Right lower leg: No edema.     Left lower  leg: No edema.  Skin:    General: Skin is warm and dry.     Capillary Refill: Capillary refill takes less than 2 seconds.     Findings: No erythema or rash.  Neurological:     General: No focal deficit present.     Mental Status: He is alert and oriented to person, place, and time. Mental status is at baseline.  Psychiatric:        Mood and Affect: Mood normal.        Behavior: Behavior normal.        Thought Content: Thought content normal.      ED Treatments / Results  Labs (all labs ordered are listed, but only abnormal results are displayed) Labs Reviewed  BASIC METABOLIC PANEL - Abnormal; Notable for the following components:      Result Value   CO2 21 (*)    Calcium 8.8 (*)    All other components within normal limits  CBC - Abnormal; Notable for the following components:   RBC 3.83 (*)    Hemoglobin 12.2 (*)    HCT 35.1 (*)    Platelets 148 (*)    All other components within normal limits  BRAIN NATRIURETIC PEPTIDE - Abnormal; Notable for the following components:   B Natriuretic Peptide 1,806.6 (*)    All other components within normal limits    EKG EKG Interpretation  Date/Time:  Sunday February 17 2019 07:51:35 EDT Ventricular Rate:  82 PR Interval:    QRS Duration: 102 QT Interval:  386 QTC Calculation: 450 R Axis:   95 Text Interpretation:  Atrial fibrillation Rightward axis Left ventricular hypertrophy No significant  change since last tracing Confirmed by Gwyneth Sprout ([id number]) on 02/17/2019 9:04:16 AM   Radiology Dg Chest 2 View  Result Date: 02/17/2019 CLINICAL DATA:  43 year old male with history of coughing congestion for the past several days. EXAM: CHEST - 2 VIEW COMPARISON:  Chest x-ray 01/12/2019. FINDINGS: Patchy multifocal ill-defined airspace consolidation in the lungs bilaterally, most confluent throughout the left mid to lower lung and in the right lung base, increased compared to the prior study. No pleural effusions. Some cephalization of the pulmonary vasculature. Mild cardiomegaly, similar to prior studies. Dilatation of the main pulmonary arteries. Upper mediastinal contours are otherwise unremarkable in appearance. IMPRESSION: 1. Unusual appearance of the chest. Given the presence of cardiomegaly with cephalization of the pulmonary vasculature, some degree of congestive heart failure and pulmonary edema is suspected. However, the asymmetry of the consolidative airspace disease is concerning for multilobar pneumonia. Further clinical evaluation is recommended. 2. Dilatation of the main pulmonary arteries, concerning for pulmonary arterial hypertension. Electronically Signed   By: Trudie Reed M.D.   On: 02/17/2019 08:35    Procedures Procedures (including critical care time)  Medications Ordered in ED Medications  sodium chloride flush (NS) 0.9 % injection 3 mL (has no administration in time range)     Initial Impression / Assessment and Plan / ED Course  I have reviewed the triage vital signs and the nursing notes.  Pertinent labs & imaging results that were available during my care of the patient were reviewed by me and considered in my medical decision making (see chart for details).        Patient with a complex medical history presenting today with palpitations and mild worsening shortness of breath.  Patient admitted in July for similar symptoms.  At that time thought to  have possibly multifocal pneumonia however patient had negative  viral cultures and had IV antibiotics and then Augmentin but also was diuresed when hospitalized.  Patient states that he has had medication changes recently and he was taken off Entresto because of hypotension.  Here patient has intermittent runs of A. fib RVR but also has more baseline heart rates in the 70s.  He was told to decrease his Lasix to 20 mg/day but because of feeling more short of breath and having a wet cough overnight he took an additional Lasix and states feels little better this morning.  He has had no fever or infectious symptoms.  Patient has had an abnormal x-ray for some time and there is concern for fluid overload in addition to possible pneumonia.  However low suspicion for pneumonia at this time as patient has not had fever or productive cough has normal white count.  Because patient has a normal renal function will give IV dose of Lasix here for concern for fluid overload.  Also when patient arrived he was satting 85% on room air which improved to 97% with 2 L.  Patient does not wear oxygen at home.  Will discuss case with cardiology.  11:18 AM Patient's BNP is elevated today at 1800 which is above his baseline which is usually between 700 and 1000.  Renal function without acute findings.  Patient given IV Lasix and Dr. Jacinto HalimGanji to come and evaluate the patient  Final Clinical Impressions(s) / ED Diagnoses   Final diagnoses:  Acute on chronic congestive heart failure, unspecified heart failure type [facility](HCC)  Hypoxia    ED Discharge Orders    None       Gwyneth SproutPlunkett, Legrand Lasser, MD 02/17/19 1119

## 2019-02-17 NOTE — ED Notes (Signed)
Dr. Einar Gip contacted to make aware of bp 91/69 and states to continue with dig po.

## 2019-02-17 NOTE — ED Notes (Signed)
Pt states headache resolved. Pt eating [place] sandwhich and tolerating well.

## 2019-02-17 NOTE — ED Notes (Signed)
Attempted to call report

## 2019-02-17 NOTE — ED Triage Notes (Signed)
Patient states that he feels intermittent palpitations with congestion for the past several days. States that he was taken off amiodarone. No CP, alert and oriented, NAD

## 2019-02-17 NOTE — H&P (Signed)
 @LOGO @  Primary Physician/Referring:  Diamantina ProvidenceAnderson, [name], FNP  Patient ID: Gary HurtMark J [name], male    DOB: 02-Feb-1976, 43 y.o.   MRN: [id number]  Chief Complaint  Patient presents with  . Palpitations  Shortness of breath HPI:    HPI: Gary [name]  is a 43 y.o. 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 Rosemary HolmsPatwardhan felt it was more like 35%, persistent atrial fibrillation, history of heterotaxy and cardiac surgery when patient was 43 years of age, details not available [place], last seen by us 4 days ago when he presented for titration of Entresto, was found to be hypotensive and hence was advised to discontinue Entresto.  He had been doing well but started noticing worsening dyspnea over the past 3 days and also heart racing symptoms, last night could not lay down flat, extremely short of breath and presented to the emergency room with hypoxemia.  Denies chest pain, dizziness or syncope.  Although he was recommended to hold off on furosemide in view of compensated heart failure and low blood pressure, he had taken furosemide in view of worsening dyspnea.  Presently he is feeling very comfortable, states that his dyspnea has improved since coming to the emergency room and receiving IV Lasix, also feels palpitation symptoms have improved.  Past Medical History:  Diagnosis Date  . Atrial fibrillation (HCC)   . CHF (congestive heart failure) (HCC)   . Dysrhythmia    hx. of  A-fib  . Former smoker    Quit  . Internal bleeding    DUE TO ASPIRIN, OR MEDS  . Murmur    Past Surgical History:  Procedure Laterality Date  . ABDOMINAL SURGERY    . ABDOMINAL SURGERY     under 10 yrs. old or younger  . CARDIAC SURGERY    . CARDIOVERSION N/A 08/17/2017   Procedure: CARDIOVERSION;  Surgeon: Orpah CobbKadakia, Ajay, MD;  [facility];  Service: Cardiovascular;   Laterality: N/A;  . HERNIA REPAIR    . INGUINAL HERNIA REPAIR Right 10/13/2017   Procedure: OPEN RIGHT INGUINAL HERNIA REPAIR ERAS PATHWAY;  Surgeon: Jimmye NormanWyatt, James, MD;  [facility];  Service: General;  Laterality: Right;  . OPEN HEART SURGERY     AS A CHILD , at 2 yrs. old. done in [place]  . TEE WITHOUT CARDIOVERSION N/A 08/17/2017   Procedure: TRANSESOPHAGEAL ECHOCARDIOGRAM (TEE);  Surgeon: Orpah CobbKadakia, Ajay, MD;  [facility];  Service: Cardiovascular;  Laterality: N/A;   Social History   Socioeconomic History  . Marital status: Single    Spouse name: Not on file  . Number of children: 8  . Years of education: Not on file  . Highest education level: Not on file  Occupational History  . Not on file  Social Needs  . Financial resource strain: Not on file  . Food insecurity    Worry: Not on file    Inability: Not on file  . Transportation needs    Medical: Not on file    Non-medical: Not on file  Tobacco Use  . Smoking status: Former Smoker    Packs/day: 1.00    Years: 20.00    Pack years: 20.00    Types: Cigarettes  . Smokeless tobacco: Never Used  Substance and Sexual Activity  . Alcohol use: Never    Frequency: Never    Comment: social  . Drug use: Not Currently  Types: Marijuana  . Sexual activity: Not on file  Lifestyle  . Physical activity    Days per week: Not on file    Minutes per session: Not on file  . Stress: Not on file  Relationships  . Social Herbalist on phone: Not on file    Gets together: Not on file    Attends religious service: Not on file    Active member of club or organization: Not on file    Attends meetings of clubs or organizations: Not on file    Relationship status: Not on file  . Intimate partner violence    Fear of current or ex partner: Not on file    Emotionally abused: Not on file    Physically abused: Not on file    Forced sexual activity: Not on file  Other Topics Concern  . Not on file  Social History  Narrative  . Not on file   ROS  Review of Systems  Constitution: Positive for malaise/fatigue. Negative for chills, decreased appetite and weight gain.  Cardiovascular: Positive for dyspnea on exertion and orthopnea. Negative for leg swelling and syncope.  Respiratory: Positive for cough.   Endocrine: Negative for cold intolerance.  Hematologic/Lymphatic: Does not bruise/bleed easily.  Musculoskeletal: Negative for joint swelling.  Gastrointestinal: Negative for abdominal pain, anorexia, change in bowel habit, hematochezia and melena.  Neurological: Negative for headaches and light-headedness.  Psychiatric/Behavioral: Negative for depression and substance abuse.  All other systems reviewed and are negative.  Objective  Blood pressure 103/61, pulse 89, temperature 98.9 F (37.2 C), temperature source Oral, resp. rate 19, height 5\' 5"  (1.651 m), SpO2 100 %. Body mass index is 19.14 kg/m.   Physical Exam  Constitutional: No distress.  Moderately built and lean, appears chronically ill.  HENT:  Head: Atraumatic.  Eyes:  Conjunctivitis muddy  Neck: Neck supple. No thyromegaly present.  Cardiovascular: Normal rate, regular rhythm, intact distal pulses and normal pulses. Exam reveals no gallop.  No murmur heard. S1 is soft, S2 is normal, 3/6 pansystolic murmur at the apex with a heaving PMI in the left anterior axillary line.  There is also 2/4 early diastolic murmur in the left parasternal region. JVD elevated up to the angle of the jaw.  Hepatojugular reflux present.  There is no hepatomegaly.  Pulmonary/Chest: Effort normal.  Faint diffuse scattered rhonchi present.  Abdominal: Soft. Bowel sounds are normal.  Musculoskeletal: Normal range of motion.  Neurological: He is alert.  Skin: Skin is warm and dry.  Psychiatric: He has a normal mood and affect.   Radiology: Dg Chest 2 View  Result Date: 02/17/2019 CLINICAL DATA:  43 year old male with history of coughing congestion for  the past several days. EXAM: CHEST - 2 VIEW COMPARISON:  Chest x-ray 01/12/2019. FINDINGS: Patchy multifocal ill-defined airspace consolidation in the lungs bilaterally, most confluent throughout the left mid to lower lung and in the right lung base, increased compared to the prior study. No pleural effusions. Some cephalization of the pulmonary vasculature. Mild cardiomegaly, similar to prior studies. Dilatation of the main pulmonary arteries. Upper mediastinal contours are otherwise unremarkable in appearance. IMPRESSION: 1. Unusual appearance of the chest. Given the presence of cardiomegaly with cephalization of the pulmonary vasculature, some degree of congestive heart failure and pulmonary edema is suspected. However, the asymmetry of the consolidative airspace disease is concerning for multilobar pneumonia. Further clinical evaluation is recommended. 2. Dilatation of the main pulmonary arteries, concerning for pulmonary arterial hypertension. Electronically  Signed   By: Trudie Reed M.D.   On: 02/17/2019 08:35    Laboratory examination:   CMP Latest Ref Rng & Units 02/17/2019 02/07/2019 01/13/2019  Glucose 70 - 99 mg/dL 99 482(N) 003(B)  BUN 6 - 20 mg/dL 18 22 14   Creatinine 0.61 - 1.24 mg/dL 0.48 8.89 1.69  Sodium 135 - 145 mmol/L 135 134 135  Potassium 3.5 - 5.1 mmol/L 3.8 4.2 3.9  Chloride 98 - 111 mmol/L 104 101 104  CO2 22 - 32 mmol/L 21(L) 19(L) 23  Calcium 8.9 - 10.3 mg/dL 4.5(W) 8.7 3.8(U)  Total Protein 6.5 - 8.1 g/dL - - -  Total Bilirubin 0.3 - 1.2 mg/dL - - -  Alkaline Phos 38 - 126 U/L - - -  AST 15 - 41 U/L - - -  ALT 0 - 44 U/L - - -   CBC Latest Ref Rng & Units 02/17/2019 01/13/2019 01/12/2019  WBC 4.0 - 10.5 K/uL 9.1 15.7(H) 15.5(H)  Hemoglobin 13.0 - 17.0 g/dL 12.2(L) 14.4 14.1  Hematocrit 39.0 - 52.0 % 35.1(L) 40.8 39.6  Platelets 150 - 400 K/uL 148(L) 157 157   Lipid Panel  No results found for: CHOL, TRIG, HDL, CHOLHDL, VLDL, LDLCALC, LDLDIRECT HEMOGLOBIN A1C No  results found for: HGBA1C, MPG TSH Recent Labs    10/27/18 0450  TSH 0.584   BNP (last 3 results) Recent Labs    10/27/18 0018 01/11/19 0735 02/17/19 0730  BNP 891.8* 1,038.0* 1,806.6*    ProBNP (last 3 results) No results for input(s): PROBNP in the last 8760 hours.  Medications   Current Outpatient Medications  Medication Instructions  . apixaban (ELIQUIS) 5 mg, Oral, 2 times daily  . furosemide (LASIX) 40 mg, Oral, Daily, Take 40 mg twice a day for 3 days, then take 40 mg daily.  . Melatonin 2.5 mg, Oral, At bedtime PRN    Cardiac Studies:   Echocardiogram 10/27/2018 :  1. The left ventricle has mildly reduced systolic function, with an ejection fraction of 45-50%. The cavity size was normal. There is mildly increased left ventricular wall thickness. Findings are consistent with Noncompaction cardiomyopathy. Left  ventricular diastolic Doppler parameters are consistent with impaired relaxation. Left ventricular diffuse hypokinesis.  2. The right ventricle has normal systolic function. The cavity was normal. There is no increase in right ventricular wall thickness.  3. Left atrial size was moderately dilated.  4. Mitral valve regurgitation is moderate by color flow Doppler. The MR jet is eccentric laterally directed.  5. Aortic valve regurgitation is mild to moderate by color flow Doppler. Personally reviewed by Dr. Rosemary Holms, EF felt to be 35%.  Assessment     ICD-10-CM   1. Acute on chronic congestive heart failure, unspecified heart failure type (HCC)  I50.9   2. Hypoxia  R09.02   3.  Non-compaction dilated cardiomyopathy 4.  Severe MR by physical examination and at least moderate aortic regurgitation.  EKG 02/17/2019: Atrial fibrillation with controlled ventricular response at the rate of 82 bpm, rightward axis, incomplete right bundle branch block.  LVH with repolarization abnormality, cannot exclude lateral ischemia.  No significant change from  01/12/2019.  Recommendations:   Patient now admitted with worsening dyspnea and palpitations, patient is in florid congestive heart failure.  Physical examination is consistent with severe MR.  I will repeat his echocardiogram, in the interim due to low blood pressure, he has not been able to tolerate guideline directed medical therapy.  He may need advanced heart failure therapy  very soon with recurrent admission to the hospital.  I will have heart failure team to follow him up in the morning.  I will start the patient on IV furosemide, he has improved with 1 dose in the emergency room, I will also start him on digoxin and will add small dose of carvedilol at 3.125 mg 1/2 tablet twice daily and slowly titrated upwards.  Yates DecampJay Sharonne Ricketts, MD, [facility] Clinic[facility]ACC 02/17/2019, 12:46 PM [facility] [place] Pager: [phone] Office: [phone] If no answer Cell [phone]

## 2019-02-18 ENCOUNTER — Inpatient Hospital Stay (HOSPITAL_COMMUNITY): Payer: Medicaid Other

## 2019-02-18 DIAGNOSIS — I5023 Acute on chronic systolic (congestive) heart failure: Secondary | ICD-10-CM

## 2019-02-18 DIAGNOSIS — I4821 Permanent atrial fibrillation: Secondary | ICD-10-CM

## 2019-02-18 DIAGNOSIS — I5043 Acute on chronic combined systolic (congestive) and diastolic (congestive) heart failure: Principal | ICD-10-CM

## 2019-02-18 DIAGNOSIS — I428 Other cardiomyopathies: Secondary | ICD-10-CM

## 2019-02-18 DIAGNOSIS — I4811 Longstanding persistent atrial fibrillation: Secondary | ICD-10-CM

## 2019-02-18 DIAGNOSIS — Z720 Tobacco use: Secondary | ICD-10-CM

## 2019-02-18 LAB — COMPREHENSIVE METABOLIC PANEL
ALT: 17 U/L (ref 0–44)
AST: 23 U/L (ref 15–41)
Albumin: 3.1 g/dL — ABNORMAL LOW (ref 3.5–5.0)
Alkaline Phosphatase: 112 U/L (ref 38–126)
Anion gap: 10 (ref 5–15)
BUN: 20 mg/dL (ref 6–20)
CO2: 23 mmol/L (ref 22–32)
Calcium: 8.9 mg/dL (ref 8.9–10.3)
Chloride: 103 mmol/L (ref 98–111)
Creatinine, Ser: 1.22 mg/dL (ref 0.61–1.24)
GFR calc Af Amer: >60 mL/min (ref >60)
GFR calc non Af Amer: >60 mL/min (ref >60)
Glucose, Bld: 123 mg/dL — ABNORMAL HIGH (ref 70–99)
Potassium: 3.9 mmol/L (ref 3.5–5.1)
Sodium: 136 mmol/L (ref 135–145)
Total Bilirubin: 2.3 mg/dL — ABNORMAL HIGH (ref 0.3–1.2)
Total Protein: 8 g/dL (ref 6.5–8.1)

## 2019-02-18 LAB — BRAIN NATRIURETIC PEPTIDE: B Natriuretic Peptide: 1297.3 pg/mL — ABNORMAL HIGH (ref 0.0–100.0)

## 2019-02-18 LAB — GLUCOSE, CAPILLARY: Glucose-Capillary: 93 mg/dL (ref 70–99)

## 2019-02-18 LAB — ECHOCARDIOGRAM COMPLETE
Height: 65 in
Weight: 1735.46 oz

## 2019-02-18 MED ORDER — ENSURE ENLIVE PO LIQD
237.0000 mL | Freq: Two times a day (BID) | ORAL | Status: DC
  Administered 2019-02-18 – 2019-02-19 (×2): 237 mL via ORAL
  Administered 2019-02-20: 110 mL via ORAL
  Administered 2019-02-20 – 2019-02-21 (×2): 237 mL via ORAL

## 2019-02-18 MED ORDER — FUROSEMIDE 10 MG/ML IJ SOLN
20.0000 mg | Freq: Once | INTRAMUSCULAR | Status: AC
  Administered 2019-02-18: 20 mg via INTRAVENOUS
  Filled 2019-02-18: qty 2

## 2019-02-18 MED ORDER — SODIUM CHLORIDE 0.9% FLUSH
3.0000 mL | Freq: Two times a day (BID) | INTRAVENOUS | Status: DC
  Administered 2019-02-19: 3 mL via INTRAVENOUS

## 2019-02-18 MED ORDER — SODIUM CHLORIDE 0.9 % IV SOLN: 250.0000 mL | INTRAVENOUS | Status: DC | PRN

## 2019-02-18 MED ORDER — SODIUM CHLORIDE 0.9% FLUSH: 3.0000 mL | INTRAVENOUS | Status: DC | PRN

## 2019-02-18 MED ORDER — BISOPROLOL FUMARATE 5 MG PO TABS
2.5000 mg | ORAL_TABLET | Freq: Every day | ORAL | Status: DC
  Administered 2019-02-18 – 2019-02-21 (×4): 2.5 mg via ORAL
  Filled 2019-02-18 (×4): qty 1

## 2019-02-18 MED ORDER — ADULT MULTIVITAMIN W/MINERALS CH
1.0000 | ORAL_TABLET | Freq: Every day | ORAL | Status: DC
  Administered 2019-02-18 – 2019-02-21 (×4): 1 via ORAL
  Filled 2019-02-18 (×4): qty 1

## 2019-02-18 MED ORDER — SODIUM CHLORIDE 0.9 % IV SOLN: 250.0000 mL | INTRAVENOUS | Status: DC

## 2019-02-18 MED ORDER — ASPIRIN 81 MG PO CHEW
81.0000 mg | CHEWABLE_TABLET | ORAL | Status: AC
  Administered 2019-02-19: 81 mg via ORAL
  Filled 2019-02-18: qty 1

## 2019-02-18 MED ORDER — SODIUM CHLORIDE 0.9 % IV SOLN: INTRAVENOUS | Status: DC

## 2019-02-18 MED ORDER — SODIUM CHLORIDE 0.9% FLUSH
3.0000 mL | Freq: Two times a day (BID) | INTRAVENOUS | Status: DC
  Administered 2019-02-18 – 2019-02-21 (×5): 3 mL via INTRAVENOUS

## 2019-02-18 MED ORDER — SODIUM CHLORIDE 0.9 % IV SOLN
INTRAVENOUS | Status: DC
  Administered 2019-02-19 (×3): via INTRAVENOUS

## 2019-02-18 MED ORDER — PNEUMOCOCCAL VAC POLYVALENT 25 MCG/0.5ML IJ INJ
0.5000 mL | INJECTION | INTRAMUSCULAR | Status: AC
  Administered 2019-02-19: 0.5 mL via INTRAMUSCULAR
  Filled 2019-02-18: qty 0.5

## 2019-02-18 NOTE — Plan of Care (Signed)
Patient is very anxious about the catheterization for tomorrow. Emotional support given to patient.

## 2019-02-18 NOTE — Progress Notes (Signed)
Initial Nutrition Assessment  DOCUMENTATION CODES:   Severe malnutrition in context of chronic illness, Underweight  INTERVENTION:   -Magic cup TID with meals, each supplement provides 290 kcal and 9 grams of protein -Ensure Enlive po BID, each supplement provides 350 kcal and 20 grams of protein -MVI with minerals daily  NUTRITION DIAGNOSIS:   Severe Malnutrition related to chronic illness(CHF) as evidenced by severe fat depletion, severe muscle depletion.  GOAL:   Patient will meet greater than or equal to 90% of their needs  MONITOR:   PO intake, Supplement acceptance, Labs, Weight trends, Skin, I & O's  REASON FOR ASSESSMENT:   Other (Comment)    ASSESSMENT:   Gary [name]  is a 43 y.o. 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  He had been doing well but started noticing worsening dyspnea over the past 3 days and also heart racing symptoms, last night could not lay down flat, extremely short of breath and presented to the emergency room with hypoxemia.  Denies chest pain, dizziness or syncope.  Although he was recommended to hold off on furosemide in view of compensated heart failure and low blood pressure, he had taken furosemide in view of worsening dyspnea.  Pt admitted with CHF.  Reviewed I/O's: -1.4 L x 24 hours  UOP: 1.9 L x 24 hours  Spoke with pt at bedside, who was pleasant and in good spirits today. He reports good appetite both currently and PTA. He states he is very hungry and is awaiting lunch tray  ("I'm hungry- they only gave me one pancake this morning a breakfast"). Pt reports he typically consumes 2 meals per day and has been trying to choose healthier options and decrease sodium in his diet- lunch typically consist of grilled salmon and rice and dinner is boiled chicken, mashed potatoes, and low sodium canned vegetables.   Pt reports that his UBW is around 120#. He reports he has always been slender and thinks his weight has been stable over the past several months, but suspects fluctuations related to fluid status. Pt does not have a scale at home, but intends to purchase one at discharge to monitor daily weight trends. Reviewed wt hx; noted pt has experienced a 5.7% wt loss over the past 3 months. This is concerning given pt's underweight status. Also suspect that fluid losses are contributing to weight loss.   Discussed importance of good meal and supplement intake to promote healing and preserve lean body mass. Reviewed importance of self-management (decreased sodium intake and daily weight monitoring) to manage heart failure. Pt amenable to Ensure supplements.   Labs reviewed.   NUTRITION - FOCUSED PHYSICAL EXAM:    Most Recent Value  Orbital Region  Severe depletion  Upper Arm Region  Severe depletion  Thoracic and Lumbar Region  Severe depletion  Buccal Region  Severe depletion  Temple Region  Severe depletion  Clavicle Bone Region  Severe depletion  Clavicle and Acromion Bone Region  Severe depletion  Scapular Bone Region  Severe depletion  Dorsal Hand  Severe depletion  Patellar Region  Severe depletion  Anterior Thigh Region  Severe depletion  Posterior Calf Region  Severe depletion  Edema (RD Assessment)  None  Hair  Reviewed  Eyes  Reviewed  Mouth  Reviewed  Skin  Reviewed  Nails  Reviewed     Diet Order:   Diet Order            Diet NPO time specified  Diet effective midnight        Diet Heart Room service appropriate? Yes; Fluid consistency: Thin  Diet  effective now              EDUCATION NEEDS:   Education needs have been addressed  Skin:  Skin Assessment: Reviewed RN Assessment  Last BM:  02/17/19  Height:   Ht Readings from Last 1 Encounters:  02/17/19 5\' 5"  (1.651 m)    Weight:   Wt Readings from Last 1 Encounters:  02/18/19 49.2 kg    Ideal Body Weight:  61.8 kg  BMI:  Body mass index is 18.05 kg/m.  Estimated Nutritional Needs:   Kcal:  1750-1950  Protein:  95-110 grams  Fluid:  > 1.7 L    Lukas Pelcher A. Mayford Knife, RD, LDN, CDCES Registered Dietitian II Certified Diabetes Care and Education Specialist Pager: [phone] After hours Pager: [phone]

## 2019-02-18 NOTE — H&P (View-Only) (Signed)
Subjective:  Breathing improved.   Objective:  Vital Signs in the last 24 hours: Temp:  [98.7 F (37.1 C)-99.7 F (37.6 C)] 98.7 F (37.1 C) (08/10 0630) Pulse Rate:  [38-134] 71 (08/10 0630) Resp:  [13-39] 16 (08/10 0630) BP: (88-117)/(53-98) 103/69 (08/10 0630) SpO2:  [94 %-100 %] 94 % (08/10 0630) Weight:  [49.2 kg] 49.2 kg (08/10 0630)  Intake/Output from previous day: 08/09 0701 - 08/10 0700 In: 483 [P.O.:480; I.V.:3] Out: 1925 [Urine:1925]  Physical Exam Constitutional: He is oriented to person, place, and time. He appears well-developed. No distress.  Small body habitus   HENT:  Head: Normocephalic and atraumatic.  Eyes: Pupils are equal, round, and reactive to light. Conjunctivae are normal.  Neck: No JVD (Mild JVD) present.  Cardiovascular: Normal rate and intact distal pulses. An irregularly irregular rhythm present.  Murmur heard. High-pitched blowing holosystolic murmur is present with a grade of 3/6 at the apex. Pulmonary/Chest: Effort normal and breath sounds normal. He has no wheezes. He has no rales.  Abdominal: Soft. Bowel sounds are normal. There is no rebound.    Musculoskeletal:        General: No edema.  Lymphadenopathy:    He has no cervical adenopathy.  Neurological: He is alert and oriented to person, place, and time. No cranial nerve deficit.  Skin: Skin is warm and dry.  Psychiatric: He has a normal mood and affect.  Nursing note and vitals reviewed.    Lab Results: BMP Recent Labs    02/07/19 0929 02/17/19 0730 02/18/19 0611  NA 134 135 136  K 4.2 3.8 3.9  CL 101 104 103  CO2 19* 21* 23  GLUCOSE 110* 99 123*  BUN 22 18 20   CREATININE 1.10 1.09 1.22  CALCIUM 8.7 8.8* 8.9  GFRNONAA 81 >60 >60  GFRAA 94 >60 >60    CBC Recent Labs  Lab 02/17/19 0730  WBC 9.1  RBC 3.83*  HGB 12.2*  HCT 35.1*  PLT 148*  MCV 91.6  MCH 31.9  MCHC 34.8  RDW 11.8    HEMOGLOBIN A1C No results found for: HGBA1C, MPG  Cardiac Panel (last  3 results) Recent Labs    11/15/18 1447 12/27/18 1322 12/27/18 1656  TROPONINI 0.05* 0.08* 0.10*    BNP (last 3 results) Recent Labs    01/11/19 0735 02/17/19 0730 02/18/19 0611  BNP 1,038.0* 1,806.6* 1,297.3*    TSH Recent Labs    10/27/18 0450  TSH 0.584    Lipid Panel  No results found for: CHOL, TRIG, HDL, CHOLHDL, VLDL, LDLCALC, LDLDIRECT   Hepatic Function Panel Recent Labs    10/22/18 1702 02/18/19 0611  PROT 7.2 8.0  ALBUMIN 3.9 3.1*  AST 35 23  ALT 26 17  ALKPHOS 114 112  BILITOT 2.0* 2.3*    Cardiac Studies:  EKG 02/17/2019: Atrial fibrillation. Rate controlled. Lateral T wave inversion. Possibly ischemia  Echocardiogram 10/27/2018: 1. The left ventricle has mildly reduced systolic function, with an ejection fraction of 45-50%. The cavity size was normal. There is mildly increased left ventricular wall thickness. Findings are consistent with Noncompaction cardiomyopathy. Left  ventricular diastolic Doppler parameters are consistent with impaired relaxation. Left ventricular diffuse hypokinesis. 2. The right ventricle has normal systolic function. The cavity was normal. There is no increase in right ventricular wall thickness. 3. Left atrial size was moderately dilated. 4. Mitral valve regurgitation is moderate by color flow Doppler. The MR jet is eccentric laterally directed. 5. Aortic valve regurgitation is mild  to moderate by color flow Doppler.  Assessment & Recommendations:  43 y/o [place] American with noncompaction cardiomyopathy, persistent atrial fibrillation, h/o abdominal and ?cardiac surgeries before age 50, ? H/o heterotaxy syndrome, admitted with heart failure exacerbation, Afib with RVR  Afib with RVR: Persistent. Now rate controlled. Switched carvedilol to bisoprolol 2.5 mg daily.  Hold eliquis with plan for caht tomorrow. Will perform TEE guided cardioversion 8/11 7:30 AM, provided no LAA/LV thrombus found. Resume Eliquis  after cath CHA2DSVasc score 1 with annual  Stroke risk 0.6%. However, given his compaction cardiomyopathy, his stroke risk is higher.   Acute on chronic systolic and diastolic heart failure; Noncompaction cardiomyopathy. Unable to use [facility] outpatient due to low blood pressure. Continue bisoprolol and digoxin for now. Will perform right and left heart cath, coronary angiography on 8/11. He will likely need advanced heart failure team.Continue IV lasix 20 mg tid for now.   Troponin elevation: Likely supply demand mismatch due to heart failure.   Aortic and mitral regurgitation: Moderate to severe. Will reassess on TEE.  AKI: Likely related to acute heart failure decompensation.  Pulmonary opacity: Differential is wide. I do suspect this may have been atypical pulmonary edema presentation. He will need pulmonary evaluation at some point.     [name], M.D. 02/18/2019, 8:35 AM [facility] Pager: [phone] Office: [phone] If no answer: [phone]

## 2019-02-18 NOTE — Progress Notes (Signed)
Echocardiogram 2D Echocardiogram has been performed.  Gary [name] 02/18/2019, 11:28 AM

## 2019-02-18 NOTE — Anesthesia Preprocedure Evaluation (Addendum)
Anesthesia Evaluation  Patient identified by MRN, date of birth, ID band Patient awake    Reviewed: Allergy & Precautions, H&P , NPO status , Patient's Chart, lab work & pertinent test results  History of Anesthesia Complications Negative for: history of anesthetic complications  Airway Mallampati: II  TM Distance: >3 FB Neck ROM: Full    Dental  (+) Poor Dentition, Missing, Dental Advisory Given   Pulmonary former smoker,    Pulmonary exam normal        Cardiovascular Exercise Tolerance: Poor hypertension, Pt. on home beta blockers +CHF  Normal cardiovascular exam+ dysrhythmias Atrial Fibrillation + Valvular Problems/Murmurs MR   Study Conclusions  - Left ventricle: Systolic function was mildly reduced. The   estimated ejection fraction was in the range of 45% to 50%. Diffuse hypokinesis. - Aortic valve: There was trivial regurgitation. - Mitral valve: There was moderate regurgitation directed centrally and anteriorly. - Left atrium: The atrium was moderately dilated. No evidence of thrombus in the atrial cavity or appendage. The appendage was morphologically a left appendage, multilobulated, and moderately dilated. Emptying velocity was normal. - Right atrium: The atrium was mildly dilated. No evidence of  thrombus in the atrial cavity or appendage. - Atrial septum: No defect or patent foramen ovale was identified. - Tricuspid valve: There was moderate regurgitation.   Neuro/Psych negative neurological ROS  negative psych ROS   GI/Hepatic negative GI ROS, Neg liver ROS,   Endo/Other  negative endocrine ROS  Renal/GU negative Renal ROS  negative genitourinary   Musculoskeletal negative musculoskeletal ROS (+)   Abdominal   Peds  Hematology  (+) Blood dyscrasia, anemia ,   Anesthesia Other Findings   Reproductive/Obstetrics                            Anesthesia Physical  Anesthesia  Plan  ASA: III  Anesthesia Plan: MAC   Post-op Pain Management:    Induction:   PONV Risk Score and Plan: Ondansetron and Propofol infusion  Airway Management Planned: Natural Airway  Additional Equipment:   Intra-op Plan:   Post-operative Plan:   Informed Consent: I have reviewed the patients History and Physical, chart, labs and discussed the procedure including the risks, benefits and alternatives for the proposed anesthesia with the patient or authorized representative who has indicated his/her understanding and acceptance.     Dental advisory given  Plan Discussed with: CRNA and Anesthesiologist  Anesthesia Plan Comments:        Anesthesia Quick Evaluation

## 2019-02-18 NOTE — Consult Note (Signed)
Advanced Heart Failure Team Consult Note   Primary Physician: Diamantina ProvidenceAnderson, [name], FNP PCP-Cardiologist:  No primary care provider on file.  Referring Dr. Rosemary HolmsPatwardhan  Reason for Consultation: Acute HF  HPI:    Gary HurtMark J Radliff is seen today for evaluation of advanced HF at the request of Dr. Rosemary HolmsPatwardhan.   Gary [name]  is a 43 y.o. 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   Previously followed by Dr. Sharyn LullHarwani and recently switched to Dr. Rosemary HolmsPatwardhan. Had DC-CV several months ago but failed. Overall as been doing fairly well. Working at [employer] and caring for his 4 daughters. Seen by Dr. Rosemary HolmsPatwardhan several days ago and Sherryll BurgerEntresto stopped due to low BP. Admitted yesterday due worsening dyspnea, orthopnea and PND. In Er volume overloaded with pro BNP 1,800 (up from 800)/   Getting IV lasix and feels some better. Denies current CP, orthopnea or PND.   Review of Systems: [y] = yes, [ ]  = no   . General: Weight gain [ ] ; Weight loss [ ] ; Anorexia [ ] ; Fatigue Cove.Etienne[y ]; Fever [ ] ; Chills [ ] ; Weakness Cove.Etienne[y ]  . Cardiac: Chest pain/pressure [ ] ; Resting SOB Cove.Etienne[y ]; Exertional SOB [ y]; Orthopnea [ ] ; Pedal Edema [ ] ; Palpitations [ y]; Syncope [ ] ; Presyncope [ y]; Paroxysmal nocturnal dyspnea[ ]   . Pulmonary: Cough [ ] ; Wheezing[ ] ; Hemoptysis[ ] ; Sputum [ ] ; Snoring [ ]   . GI: Vomiting[ ] ; Dysphagia[ ] ; Melena[ ] ; Hematochezia [ ] ; Heartburn[ ] ; Abdominal pain [ ] ; Constipation [ ] ; Diarrhea [ ] ; BRBPR [ ]   . GU: Hematuria[ ] ; Dysuria [ ] ; Nocturia[ ]   . Vascular: Pain in legs with walking [ ] ; Pain in feet with lying flat [ ] ; Non-healing sores [ ] ; Stroke [ ] ; TIA [ ] ; Slurred speech [ ] ;  . Neuro: Headaches[ ] ; Vertigo[ ] ; Seizures[ ] ; Paresthesias[ ] ;Blurred vision [ ] ; Diplopia [ ] ; Vision changes [ ]    . Ortho/Skin: Arthritis Cove.Etienne[y ]; Joint pain Cove.Etienne[y ]; Muscle pain [ ] ; Joint swelling [ ] ; Back Pain [ ] ; Rash [ ]   . Psych: Depression[y ]; Anxiety[ ]   . Heme: Bleeding problems [ ] ; Clotting disorders [ ] ; Anemia [ ]   . Endocrine: Diabetes [ ] ; Thyroid dysfunction[ ]   Home Medications Prior to Admission medications   Medication Sig Start Date End Date Taking? Authorizing Provider  apixaban (ELIQUIS) 5 MG TABS tablet Take 1 tablet (5 mg total) by mouth 2 (two) times daily. 02/07/19  Yes Patwardhan, Manish J, MD  furosemide (LASIX) 40 MG tablet Take 1 tablet (40 mg total) by mouth daily. Take 40 mg twice a day for 3 days, then take 40 mg daily. 01/31/19  Yes Patwardhan, Manish J, MD  Melatonin 2.5 MG CAPS Take 1 capsule (2.5 mg total) by mouth at bedtime as needed. Patient taking differently: Take 2.5 mg by mouth at bedtime as needed (sleep).  12/07/18  Yes Georgetta HaberBurky, Natalie B, NP    Past Medical History: Past Medical History:  Diagnosis Date  . Atrial fibrillation (HCC)   . CHF (congestive heart failure) (HCC)   . Dysrhythmia    hx. of  A-fib  . Former smoker    Quit  . Internal bleeding    DUE TO ASPIRIN, OR MEDS  . Murmur  Past Surgical History: Past Surgical History:  Procedure Laterality Date  . ABDOMINAL SURGERY    . ABDOMINAL SURGERY     under 10 yrs. old or younger  . CARDIAC SURGERY    . CARDIOVERSION N/A 08/17/2017   Procedure: CARDIOVERSION;  Surgeon: Orpah Cobb, MD;  Location[facility] END[facility]COPY;  Service: Cardiovascular;  Laterality: N/A;  . HERNIA REPAIR    . INGUINAL HERNIA REPAIR Right 10/13/2017   Procedure: OPEN RIGHT INGUINAL HERNIA REPAIR ERAS PATHWAY;  Surgeon: Jimmye Norman, MD;  Location: [facility] OR;  Service: General;  Laterality: Right;  . OPEN HEART SURGERY     AS A CHILD , at 2 yrs. old. done in [place]  . TEE WITHOUT CARDIOVERSION N/A 08/17/2017   Procedure: TRANSESOPHAGEAL ECHOCARDIOGRAM (TEE);  Surgeon: Orpah Cobb, MD;  Location: [facility] ENDOSCOPY;  Service:  Cardiovascular;  Laterality: N/A;    Family History: Family History  Problem Relation Age of Onset  . Sarcoidosis Mother   . Healthy Father     Social History: Social History   Socioeconomic History  . Marital status: Single    Spouse name: Not on file  . Number of children: 8  . Years of education: Not on file  . Highest education level: Not on file  Occupational History  . Not on file  Social Needs  . Financial resource strain: Not on file  . Food insecurity    Worry: Not on file    Inability: Not on file  . Transportation needs    Medical: Not on file    Non-medical: Not on file  Tobacco Use  . Smoking status: Former Smoker    Packs/day: 1.00    Years: 20.00    Pack years: 20.00    Types: Cigarettes  . Smokeless tobacco: Never Used  Substance and Sexual Activity  . Alcohol use: Never    Frequency: Never    Comment: social  . Drug use: Not Currently    Types: Marijuana  . Sexual activity: Not on file  Lifestyle  . Physical activity    Days per week: Not on file    Minutes per session: Not on file  . Stress: Not on file  Relationships  . Social Musician on phone: Not on file    Gets together: Not on file    Attends religious service: Not on file    Active member of club or organization: Not on file    Attends meetings of clubs or organizations: Not on file    Relationship status: Not on file  Other Topics Concern  . Not on file  Social History Narrative  . Not on file    Allergies:  Allergies  Allergen Reactions  . Aspirin Other (See Comments)    Internal bleeding     Objective:    Vital Signs:   Temp:  [98.7 F (37.1 C)-99.7 F (37.6 C)] 98.7 F (37.1 C) (08/10 0630) Pulse Rate:  [38-124] 72 (08/10 0930) Resp:  [13-39] 16 (08/10 0630) BP: (88-117)/(53-98) 94/64 (08/10 0930) SpO2:  [94 %-100 %] 95 % (08/10 0930) Weight:  [49.2 kg] 49.2 kg (08/10 0630) Last BM Date: 02/17/19  Weight change: Filed Weights   02/18/19 0630   Weight: 49.2 kg    Intake/Output:   Intake/Output Summary (Last 24 hours) at 02/18/2019 1055 Last data filed at 02/18/2019 0500 Gross per 24 hour  Intake 480 ml  Output 1925 ml  Net -1445 ml      Physical Exam  General:  Small stature. Lying in bed NAD HEENT: normal Neck: supple. JVP 10 . Carotids 2+ bilat; no bruits. No lymphadenopathy or thyromegaly appreciated. Cor: PMI laterally displaced. Irregular + prominent s3 and 3/6 MR Lungs: clear Abdomen: soft, nontender, nondistended. No hepatosplenomegaly. No bruits or masses. Good bowel sounds. Extremities: no cyanosis, clubbing, rash, edema Neuro: alert & orientedx3, cranial nerves grossly intact. moves all 4 extremities w/o difficulty. Affect pleasant   Telemetry   AF with PVCs 100-110 Personally reviewed   EKG    AF 82 with PVCs LVH with repol Personally reviewed  Labs   Basic Metabolic Panel: Recent Labs  Lab 02/17/19 0730 02/18/19 0611  NA 135 136  K 3.8 3.9  CL 104 103  CO2 21* 23  GLUCOSE 99 123*  BUN 18 20  CREATININE 1.09 1.22  CALCIUM 8.8* 8.9    Liver Function Tests: Recent Labs  Lab 02/18/19 0611  AST 23  ALT 17  ALKPHOS 112  BILITOT 2.3*  PROT 8.0  ALBUMIN 3.1*   No results for input(s): LIPASE, AMYLASE in the last 168 hours. No results for input(s): AMMONIA in the last 168 hours.  CBC: Recent Labs  Lab 02/17/19 0730  WBC 9.1  HGB 12.2*  HCT 35.1*  MCV 91.6  PLT 148*    Cardiac Enzymes: No results for input(s): CKTOTAL, CKMB, CKMBINDEX, TROPONINI in the last 168 hours.  BNP: BNP (last 3 results) Recent Labs    01/11/19 0735 02/17/19 0730 02/18/19 0611  BNP 1,038.0* 1,806.6* 1,297.3*    ProBNP (last 3 results) No results for input(s): PROBNP in the last 8760 hours.   CBG: No results for input(s): GLUCAP in the last 168 hours.  Coagulation Studies: No results for input(s): LABPROT, INR in the last 72 hours.   Imaging    No results found.    Medications:     Current Medications: . bisoprolol  2.5 mg Oral Daily  . digoxin  0.25 mg Oral Daily  . furosemide  20 mg Intravenous TID  . [START ON 02/19/2019] pneumococcal 23 valent vaccine  0.5 mL Intramuscular Tomorrow-1000  . potassium chloride  10 mEq Oral TID  . sodium chloride flush  3 mL Intravenous Q12H     Infusions: . sodium chloride        Assessment/Plan   1. Acute on chronic systolic HF - apparently due to noncompaction CM. Previous EF read as 35% but base on exam I suspect it is worse and he ahs significant MR - agree with repeat echo and R/L cath - would also consider cMRI - NYHA III + volume overload - Agree with IV diuresis  - BP too low for Entresto.  - Continue dig (consider decrease to 0.125). Start spiro - Continue bisoprolol for now - BP too low currently for ACE/ARB/ARNI  2. Permanent AF - holding Eliquis for cath - resume after cath - Dr. Rosemary HolmsPatwardhan planning TEE/DC-CV but not sure this will hold. Previously did not tolerate amio well  3. Congenital heart disease - details unclear. Had chest surgery at age 89 in [place]. Hospital now closed and unable to get records - ? ASD/VSD repair  4. Tobacco use - encouraged cessation   5. Mitral regurgitation - severe on exam. Await echo   Await further data from upcoming procedures to guide next steps. Would be marginal candidate for advanced therapies.   Arvilla Meresaniel , MD  11:08 AM    Length of Stay: 1  Arvilla Meresaniel , MD  02/18/2019, 10:55  AM  Advanced Heart Failure Team Pager [phone] (M-F; 7a - 4p)  Please contact [facility] for night-coverage after hours (4p -7a ) and weekends on [email or web address]

## 2019-02-18 NOTE — Progress Notes (Addendum)
Subjective:  Breathing improved.   Objective:  Vital Signs in the last 24 hours: Temp:  [98.7 F (37.1 C)-99.7 F (37.6 C)] 98.7 F (37.1 C) (08/10 0630) Pulse Rate:  [38-134] 71 (08/10 0630) Resp:  [13-39] 16 (08/10 0630) BP: (88-117)/(53-98) 103/69 (08/10 0630) SpO2:  [94 %-100 %] 94 % (08/10 0630) Weight:  [49.2 kg] 49.2 kg (08/10 0630)  Intake/Output from previous day: 08/09 0701 - 08/10 0700 In: 483 [P.O.:480; I.V.:3] Out: 1925 [Urine:1925]  Physical Exam Constitutional: He is oriented to person, place, and time. He appears well-developed. No distress.  Small body habitus   HENT:  Head: Normocephalic and atraumatic.  Eyes: Pupils are equal, round, and reactive to light. Conjunctivae are normal.  Neck: No JVD (Mild JVD) present.  Cardiovascular: Normal rate and intact distal pulses. An irregularly irregular rhythm present.  Murmur heard. High-pitched blowing holosystolic murmur is present with a grade of 3/6 at the apex. Pulmonary/Chest: Effort normal and breath sounds normal. He has no wheezes. He has no rales.  Abdominal: Soft. Bowel sounds are normal. There is no rebound.    Musculoskeletal:        General: No edema.  Lymphadenopathy:    He has no cervical adenopathy.  Neurological: He is alert and oriented to person, place, and time. No cranial nerve deficit.  Skin: Skin is warm and dry.  Psychiatric: He has a normal mood and affect.  Nursing note and vitals reviewed.    Lab Results: BMP Recent Labs    02/07/19 0929 02/17/19 0730 02/18/19 0611  NA 134 135 136  K 4.2 3.8 3.9  CL 101 104 103  CO2 19* 21* 23  GLUCOSE 110* 99 123*  BUN 22 18 20  CREATININE 1.10 1.09 1.22  CALCIUM 8.7 8.8* 8.9  GFRNONAA 81 >60 >60  GFRAA 94 >60 >60    CBC Recent Labs  Lab 02/17/19 0730  WBC 9.1  RBC 3.83*  HGB 12.2*  HCT 35.1*  PLT 148*  MCV 91.6  MCH 31.9  MCHC 34.8  RDW 11.8    HEMOGLOBIN A1C No results found for: HGBA1C, MPG  Cardiac Panel (last  3 results) Recent Labs    11/15/18 1447 12/27/18 1322 12/27/18 1656  TROPONINI 0.05* 0.08* 0.10*    BNP (last 3 results) Recent Labs    01/11/19 0735 02/17/19 0730 02/18/19 0611  BNP 1,038.0* 1,806.6* 1,297.3*    TSH Recent Labs    10/27/18 0450  TSH 0.584    Lipid Panel  No results found for: CHOL, TRIG, HDL, CHOLHDL, VLDL, LDLCALC, LDLDIRECT   Hepatic Function Panel Recent Labs    10/22/18 1702 02/18/19 0611  PROT 7.2 8.0  ALBUMIN 3.9 3.1*  AST 35 23  ALT 26 17  ALKPHOS 114 112  BILITOT 2.0* 2.3*    Cardiac Studies:  EKG 02/17/2019: Atrial fibrillation. Rate controlled. Lateral T wave inversion. Possibly ischemia  Echocardiogram 10/27/2018: 1. The left ventricle has mildly reduced systolic function, with an ejection fraction of 45-50%. The cavity size was normal. There is mildly increased left ventricular wall thickness. Findings are consistent with Noncompaction cardiomyopathy. Left  ventricular diastolic Doppler parameters are consistent with impaired relaxation. Left ventricular diffuse hypokinesis. 2. The right ventricle has normal systolic function. The cavity was normal. There is no increase in right ventricular wall thickness. 3. Left atrial size was moderately dilated. 4. Mitral valve regurgitation is moderate by color flow Doppler. The MR jet is eccentric laterally directed. 5. Aortic valve regurgitation is mild   to moderate by color flow Doppler.  Assessment & Recommendations:  43 y/o [place] American with noncompaction cardiomyopathy, persistent atrial fibrillation, h/o abdominal and ?cardiac surgeries before age 43, ? H/o heterotaxy syndrome, admitted with heart failure exacerbation, Afib with RVR  Afib with RVR: Persistent. Now rate controlled. Switched carvedilol to bisoprolol 2.5 mg daily.  Hold eliquis with plan for caht tomorrow. Will perform TEE guided cardioversion 8/11 7:30 AM, provided no LAA/LV thrombus found. Resume Eliquis  after cath CHA2DSVasc score 1 with annual  Stroke risk 0.6%. However, given his compaction cardiomyopathy, his stroke risk is higher.   Acute on chronic systolic and diastolic heart failure; Noncompaction cardiomyopathy. Unable to use [facility] outpatient due to low blood pressure. Continue bisoprolol and digoxin for now. Will perform right and left heart cath, coronary angiography on 8/11. He will likely need advanced heart failure team.Continue IV lasix 20 mg tid for now.   Troponin elevation: Likely supply demand mismatch due to heart failure.   Aortic and mitral regurgitation: Moderate to severe. Will reassess on TEE.  AKI: Likely related to acute heart failure decompensation.  Pulmonary opacity: Differential is wide. I do suspect this may have been atypical pulmonary edema presentation. He will need pulmonary evaluation at some point.     [name], M.D. 02/18/2019, 8:35 AM [facility] Pager: [phone] Office: [phone] If no answer: [phone]

## 2019-02-18 NOTE — Plan of Care (Signed)
  Problem: Education: Goal: Ability to demonstrate management of disease process will improve Outcome: Progressing Goal: Ability to verbalize understanding of medication therapies will improve Outcome: Progressing Goal: Individualized Educational Video(s) Outcome: Progressing   Problem: Activity: Goal: Capacity to carry out activities will improve Outcome: Progressing   Problem: Cardiac: Goal: Ability to achieve and maintain adequate cardiopulmonary perfusion will improve Outcome: Progressing   Problem: Education: Goal: Knowledge of General Education information will improve Description: Including pain rating scale, medication(s)/side effects and non-pharmacologic comfort measures Outcome: Progressing   Problem: Health Behavior/Discharge Planning: Goal: Ability to manage health-related needs will improve Outcome: Progressing   Problem: Clinical Measurements: Goal: Ability to maintain clinical measurements within normal limits will improve Outcome: Progressing Goal: Will remain free from infection Outcome: Progressing Goal: Diagnostic test results will improve Outcome: Progressing Goal: Respiratory complications will improve Outcome: Progressing Goal: Cardiovascular complication will be avoided Outcome: Progressing   Problem: Activity: Goal: Risk for activity intolerance will decrease Outcome: Progressing   Problem: Nutrition: Goal: Adequate nutrition will be maintained Outcome: Progressing   Problem: Coping: Goal: Level of anxiety will decrease Outcome: Progressing   Problem: Elimination: Goal: Will not experience complications related to bowel motility Outcome: Progressing Goal: Will not experience complications related to urinary retention Outcome: Progressing   Problem: Pain Managment: Goal: General experience of comfort will improve Outcome: Progressing   Problem: Safety: Goal: Ability to remain free from injury will improve Outcome: Progressing    Problem: Skin Integrity: Goal: Risk for impaired skin integrity will decrease Outcome: Progressing   Problem: Education: Goal: Knowledge of disease or condition will improve Outcome: Progressing Goal: Understanding of medication regimen will improve Outcome: Progressing Goal: Individualized Educational Video(s) Outcome: Progressing   Problem: Activity: Goal: Ability to tolerate increased activity will improve Outcome: Progressing   Problem: Cardiac: Goal: Ability to achieve and maintain adequate cardiopulmonary perfusion will improve Outcome: Progressing   Problem: Health Behavior/Discharge Planning: Goal: Ability to safely manage health-related needs after discharge will improve Outcome: Progressing   

## 2019-02-19 ENCOUNTER — Encounter (HOSPITAL_COMMUNITY)
Admission: EM | Disposition: A | Discharge status: Home / Self Care | Payer: Self-pay | Source: Home / Self Care | Attending: Cardiology

## 2019-02-19 ENCOUNTER — Encounter (HOSPITAL_COMMUNITY): Payer: Self-pay | Admitting: Certified Registered Nurse Anesthetist

## 2019-02-19 ENCOUNTER — Inpatient Hospital Stay (HOSPITAL_COMMUNITY): Payer: Medicaid Other

## 2019-02-19 ENCOUNTER — Inpatient Hospital Stay (HOSPITAL_COMMUNITY): Payer: Medicaid Other | Admitting: Certified Registered Nurse Anesthetist

## 2019-02-19 DIAGNOSIS — I5043 Acute on chronic combined systolic (congestive) and diastolic (congestive) heart failure: Secondary | ICD-10-CM

## 2019-02-19 DIAGNOSIS — I428 Other cardiomyopathies: Secondary | ICD-10-CM

## 2019-02-19 DIAGNOSIS — I509 Heart failure, unspecified: Secondary | ICD-10-CM

## 2019-02-19 DIAGNOSIS — Z720 Tobacco use: Secondary | ICD-10-CM

## 2019-02-19 DIAGNOSIS — I4821 Permanent atrial fibrillation: Secondary | ICD-10-CM

## 2019-02-19 DIAGNOSIS — I4811 Longstanding persistent atrial fibrillation: Secondary | ICD-10-CM

## 2019-02-19 HISTORY — PX: TEE WITHOUT CARDIOVERSION: SHX5443

## 2019-02-19 HISTORY — PX: RIGHT/LEFT HEART CATH AND CORONARY ANGIOGRAPHY: CATH118266

## 2019-02-19 LAB — POCT I-STAT EG7
Acid-Base Excess: 1 mmol/L (ref 0.0–2.0)
Acid-Base Excess: 1 mmol/L (ref 0.0–2.0)
Bicarbonate: 25.4 mmol/L (ref 20.0–28.0)
Bicarbonate: 26.8 mmol/L (ref 20.0–28.0)
Bicarbonate: 26.8 mmol/L (ref 20.0–28.0)
Calcium, Ion: 1.23 mmol/L (ref 1.15–1.40)
Calcium, Ion: 1.27 mmol/L (ref 1.15–1.40)
Calcium, Ion: 1.28 mmol/L (ref 1.15–1.40)
HCT: 37 % — ABNORMAL LOW (ref 39.0–52.0)
HCT: 38 % — ABNORMAL LOW (ref 39.0–52.0)
HCT: 38 % — ABNORMAL LOW (ref 39.0–52.0)
Hemoglobin: 12.6 g/dL — ABNORMAL LOW (ref 13.0–17.0)
Hemoglobin: 12.9 g/dL — ABNORMAL LOW (ref 13.0–17.0)
Hemoglobin: 12.9 g/dL — ABNORMAL LOW (ref 13.0–17.0)
O2 Saturation: 67 %
O2 Saturation: 70 %
O2 Saturation: 72 %
Potassium: 4.4 mmol/L (ref 3.5–5.1)
Potassium: 4.5 mmol/L (ref 3.5–5.1)
Potassium: 4.5 mmol/L (ref 3.5–5.1)
Sodium: 140 mmol/L (ref 135–145)
Sodium: 141 mmol/L (ref 135–145)
Sodium: 142 mmol/L (ref 135–145)
TCO2: 27 mmol/L (ref 22–32)
TCO2: 28 mmol/L (ref 22–32)
TCO2: 28 mmol/L (ref 22–32)
pCO2, Ven: 43.8 mmHg — ABNORMAL LOW (ref 44.0–60.0)
pCO2, Ven: 45.9 mmHg (ref 44.0–60.0)
pCO2, Ven: 47.8 mmHg (ref 44.0–60.0)
pH, Ven: 7.357 (ref 7.250–7.430)
pH, Ven: 7.372 (ref 7.250–7.430)
pH, Ven: 7.375 (ref 7.250–7.430)
pO2, Ven: 36 mmHg (ref 32.0–45.0)
pO2, Ven: 38 mmHg (ref 32.0–45.0)
pO2, Ven: 40 mmHg (ref 32.0–45.0)

## 2019-02-19 LAB — POCT I-STAT 7, (LYTES, BLD GAS, ICA,H+H)
Acid-Base Excess: 1 mmol/L (ref 0.0–2.0)
Bicarbonate: 24.6 mmol/L (ref 20.0–28.0)
Bicarbonate: 26.5 mmol/L (ref 20.0–28.0)
Calcium, Ion: 1.25 mmol/L (ref 1.15–1.40)
Calcium, Ion: 1.25 mmol/L (ref 1.15–1.40)
HCT: 38 % — ABNORMAL LOW (ref 39.0–52.0)
HCT: 38 % — ABNORMAL LOW (ref 39.0–52.0)
Hemoglobin: 12.9 g/dL — ABNORMAL LOW (ref 13.0–17.0)
Hemoglobin: 12.9 g/dL — ABNORMAL LOW (ref 13.0–17.0)
O2 Saturation: 97 %
O2 Saturation: 98 %
Potassium: 4.4 mmol/L (ref 3.5–5.1)
Potassium: 4.5 mmol/L (ref 3.5–5.1)
Sodium: 139 mmol/L (ref 135–145)
Sodium: 141 mmol/L (ref 135–145)
TCO2: 26 mmol/L (ref 22–32)
TCO2: 28 mmol/L (ref 22–32)
pCO2 arterial: 39.6 mmHg (ref 32.0–48.0)
pCO2 arterial: 42.6 mmHg (ref 32.0–48.0)
pH, Arterial: 7.401 (ref 7.350–7.450)
pH, Arterial: 7.402 (ref 7.350–7.450)
pO2, Arterial: 115 mmHg — ABNORMAL HIGH (ref 83.0–108.0)
pO2, Arterial: 90 mmHg (ref 83.0–108.0)

## 2019-02-19 LAB — BRAIN NATRIURETIC PEPTIDE: B Natriuretic Peptide: 1351.7 pg/mL — ABNORMAL HIGH (ref 0.0–100.0)

## 2019-02-19 SURGERY — RIGHT/LEFT HEART CATH AND CORONARY ANGIOGRAPHY
Anesthesia: LOCAL

## 2019-02-19 SURGERY — ECHOCARDIOGRAM, TRANSESOPHAGEAL
Anesthesia: Monitor Anesthesia Care

## 2019-02-19 MED ORDER — HYDRALAZINE HCL 20 MG/ML IJ SOLN: 10.0000 mg | INTRAMUSCULAR | Status: AC | PRN

## 2019-02-19 MED ORDER — LIDOCAINE HCL (PF) 1 % IJ SOLN
INTRAMUSCULAR | Status: DC | PRN
  Administered 2019-02-19: 4 mL

## 2019-02-19 MED ORDER — ONDANSETRON HCL 4 MG/2ML IJ SOLN
INTRAMUSCULAR | Status: DC | PRN
  Administered 2019-02-19: 4 mg via INTRAVENOUS

## 2019-02-19 MED ORDER — VERAPAMIL HCL 2.5 MG/ML IV SOLN
INTRAVENOUS | Status: DC | PRN
  Administered 2019-02-19: 10:00:00 5 mL via INTRA_ARTERIAL

## 2019-02-19 MED ORDER — LIDOCAINE 2% (20 MG/ML) 5 ML SYRINGE
INTRAMUSCULAR | Status: DC | PRN
  Administered 2019-02-19: 60 mg via INTRAVENOUS

## 2019-02-19 MED ORDER — HEPARIN (PORCINE) IN NACL 1000-0.9 UT/500ML-% IV SOLN
INTRAVENOUS | Status: DC | PRN
  Administered 2019-02-19 (×2): 500 mL

## 2019-02-19 MED ORDER — GUAIFENESIN ER 600 MG PO TB12
600.0000 mg | ORAL_TABLET | Freq: Two times a day (BID) | ORAL | Status: DC | PRN
  Administered 2019-02-19: 600 mg via ORAL
  Filled 2019-02-19: qty 1

## 2019-02-19 MED ORDER — SODIUM CHLORIDE 0.9% FLUSH: 3.0000 mL | INTRAVENOUS | Status: DC | PRN

## 2019-02-19 MED ORDER — PROPOFOL 10 MG/ML IV BOLUS
INTRAVENOUS | Status: DC | PRN
  Administered 2019-02-19: 20 mg via INTRAVENOUS
  Administered 2019-02-19: 10 mg via INTRAVENOUS
  Administered 2019-02-19: 20 mg via INTRAVENOUS

## 2019-02-19 MED ORDER — HEPARIN SODIUM (PORCINE) 1000 UNIT/ML IJ SOLN
INTRAMUSCULAR | Status: DC | PRN
  Administered 2019-02-19: 2500 [IU] via INTRAVENOUS

## 2019-02-19 MED ORDER — ACETAMINOPHEN 325 MG PO TABS: 650.0000 mg | ORAL_TABLET | ORAL | Status: DC | PRN

## 2019-02-19 MED ORDER — DIGOXIN 125 MCG PO TABS
0.1250 mg | ORAL_TABLET | Freq: Every day | ORAL | Status: DC
  Administered 2019-02-20 – 2019-02-21 (×2): 0.125 mg via ORAL
  Filled 2019-02-19 (×2): qty 1

## 2019-02-19 MED ORDER — SODIUM CHLORIDE 0.9 % IV SOLN: 250.0000 mL | INTRAVENOUS | Status: DC | PRN

## 2019-02-19 MED ORDER — HEPARIN (PORCINE) IN NACL 1000-0.9 UT/500ML-% IV SOLN
INTRAVENOUS | Status: AC
  Filled 2019-02-19: qty 1000

## 2019-02-19 MED ORDER — APIXABAN 5 MG PO TABS: 5.0000 mg | ORAL_TABLET | Freq: Two times a day (BID) | ORAL | Status: DC

## 2019-02-19 MED ORDER — LABETALOL HCL 5 MG/ML IV SOLN: 10.0000 mg | INTRAVENOUS | Status: AC | PRN

## 2019-02-19 MED ORDER — ONDANSETRON HCL 4 MG/2ML IJ SOLN: 4.0000 mg | Freq: Four times a day (QID) | INTRAMUSCULAR | Status: DC | PRN

## 2019-02-19 MED ORDER — PROPOFOL 500 MG/50ML IV EMUL
INTRAVENOUS | Status: DC | PRN
  Administered 2019-02-19: 125 ug/kg/min via INTRAVENOUS

## 2019-02-19 MED ORDER — APIXABAN 5 MG PO TABS
5.0000 mg | ORAL_TABLET | Freq: Two times a day (BID) | ORAL | Status: DC
  Administered 2019-02-19 – 2019-02-21 (×5): 5 mg via ORAL
  Filled 2019-02-19 (×5): qty 1

## 2019-02-19 MED ORDER — PHENYLEPHRINE 40 MCG/ML (10ML) SYRINGE FOR IV PUSH (FOR BLOOD PRESSURE SUPPORT)
PREFILLED_SYRINGE | INTRAVENOUS | Status: DC | PRN
  Administered 2019-02-19 (×3): 80 ug via INTRAVENOUS

## 2019-02-19 MED ORDER — SODIUM CHLORIDE 0.9% FLUSH: 3.0000 mL | Freq: Two times a day (BID) | INTRAVENOUS | Status: DC

## 2019-02-19 MED ORDER — IOHEXOL 350 MG/ML SOLN
INTRAVENOUS | Status: DC | PRN
  Administered 2019-02-19: 40 mL via INTRA_ARTERIAL

## 2019-02-19 MED ORDER — VERAPAMIL HCL 2.5 MG/ML IV SOLN
INTRAVENOUS | Status: AC
  Filled 2019-02-19: qty 2

## 2019-02-19 MED ORDER — LIDOCAINE HCL (PF) 1 % IJ SOLN
INTRAMUSCULAR | Status: AC
  Filled 2019-02-19: qty 30

## 2019-02-19 MED ORDER — SPIRONOLACTONE 12.5 MG HALF TABLET
12.5000 mg | ORAL_TABLET | Freq: Every day | ORAL | Status: DC
  Administered 2019-02-19 – 2019-02-21 (×3): 12.5 mg via ORAL
  Filled 2019-02-19 (×3): qty 1

## 2019-02-19 SURGICAL SUPPLY — 17 items
BAG SNAP BAND KOVER 36X36 (MISCELLANEOUS) ×2 IMPLANT
CATH BALLN WEDGE 5F 110CM (CATHETERS) ×2 IMPLANT
CATH INFINITI 5FR ANG PIGTAIL (CATHETERS) ×2 IMPLANT
CATH INFINITI JR4 5F (CATHETERS) ×2 IMPLANT
CATH OPTITORQUE TIG 4.0 5F (CATHETERS) ×2 IMPLANT
COVER DOME SNAP 22 D (MISCELLANEOUS) ×4 IMPLANT
DEVICE RAD TR BAND REGULAR (VASCULAR PRODUCTS) ×2 IMPLANT
GUIDEWIRE .025 260CM (WIRE) ×2 IMPLANT
GUIDEWIRE INQWIRE 1.5J.035X260 (WIRE) ×1 IMPLANT
INQWIRE 1.5J .035X260CM (WIRE) ×2
KIT HEART LEFT (KITS) ×2 IMPLANT
PACK CARDIAC CATHETERIZATION (CUSTOM PROCEDURE TRAY) ×2 IMPLANT
SHEATH GLIDE SLENDER 4/5FR (SHEATH) ×2 IMPLANT
SHEATH PROBE COVER 6X72 (BAG) ×2 IMPLANT
SHEATH RAIN RADIAL 21G 6FR (SHEATH) ×2 IMPLANT
TRANSDUCER W/STOPCOCK (MISCELLANEOUS) ×2 IMPLANT
TUBING CIL FLEX 10 FLL-RA (TUBING) ×2 IMPLANT

## 2019-02-19 NOTE — CV Procedure (Signed)
TEE: Under moderate sedation, TEE was performed without complications: LV: Severe noncompaction. LVEF 25-30%. RA: Mildly dilated. LA: Severely dilated. Severe dilated LAA with no thrombus. Possible interatrial septal repair. RA: Moderately dilated.  MV: Severe MR. TV: Moderate TR AV: Moderate AI. PV: Moderate PI.  Thoracic and ascending aorta: Normal without significant plaque or atheromatous changes.  Cardioversion not performed due to concern regarding maintenance of rhythm and not being on anticoagulation for cardiac catheterization procedure.   Deep sedation administered and monitored by anesthesiology.  [name], MD [facility] Pager: [phone] Office: [phone] If no answer Cell [phone]

## 2019-02-19 NOTE — Anesthesia Postprocedure Evaluation (Signed)
Anesthesia Post Note  Patient: Rodd Heft Corzine  Procedure(s) Performed: TRANSESOPHAGEAL ECHOCARDIOGRAM (TEE) (N/A )     Patient location during evaluation: PACU Anesthesia Type: MAC Level of consciousness: awake and alert Pain management: pain level controlled Vital Signs Assessment: post-procedure vital signs reviewed and stable Respiratory status: spontaneous breathing and respiratory function stable Cardiovascular status: stable Postop Assessment: no apparent nausea or vomiting Anesthetic complications: no    Last Vitals:  Vitals:   02/19/19 0824 02/19/19 0834  BP: (!) 94/56 (!) 104/46  Pulse: 72 (!) 52  Resp: (!) 37 (!) 25  Temp:    SpO2: 96% 97%    Last Pain:  Vitals:   02/19/19 0834  TempSrc:   PainSc: 0-No pain                 Anandi Abramo DANIEL

## 2019-02-19 NOTE — Progress Notes (Signed)
Advanced Heart Failure Rounding Note   Subjective:    Patient seen in TEE suite with Dr. Rosemary Holms.  Echo shows severe noncompaction with dilated LV. Severe MR. Severely dilated LA. 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   Weight stable   Objective:   Weight Range:  Vital Signs:   Temp:  [98.1 F (36.7 C)-99 F (37.2 C)] 98.5 F (36.9 C) (08/11 0814) Pulse Rate:  [58-82] 81 (08/11 0814) Resp:  [15-28] 28 (08/11 0814) BP: (94-108)/(52-68) 99/52 (08/11 0814) SpO2:  [92 %-95 %] 92 % (08/11 0814) Weight:  [49.2 kg] 49.2 kg (08/11 0700) Last BM Date: 02/18/19  Weight change: Filed Weights   02/18/19 0630 02/19/19 0443 02/19/19 0700  Weight: 49.2 kg 49.2 kg 49.2 kg    Intake/Output:   Intake/Output Summary (Last 24 hours) at 02/19/2019 0818 Last data filed at 02/19/2019 0809 Gross per 24 hour  Intake 886.7 ml  Output 1000 ml  Net -113.3 ml     Physical Exam: General:  Lying in bed HEENT: normal Neck: supple. JVP to jaw . Carotids 2+ bilat; no bruits. No lymphadenopathy or thryomegaly appreciated. Cor: PMI laterally displaced. Irregular rate & rhythm. 3/6 MR +s3 Lungs: clear Abdomen: soft, nontender, nondistended. No hepatosplenomegaly. No bruits or masses. Good bowel sounds. Extremities: no cyanosis, clubbing, rash, edema Neuro: alert & orientedx3, cranial nerves grossly intact. moves all 4 extremities w/o difficulty. Affect pleasant  Telemetry:  AF 80-90s. Personally reviewed   Labs: Basic Metabolic Panel: Recent Labs  Lab 02/17/19 0730 02/18/19 0611  NA 135 136  K 3.8 3.9  CL 104 103  CO2 21* 23  GLUCOSE 99 123*  BUN 18 20  CREATININE 1.09 1.22  CALCIUM 8.8* 8.9    Liver Function Tests: Recent Labs  Lab 02/18/19 0611  AST 23  ALT 17  ALKPHOS 112  BILITOT 2.3*  PROT 8.0  ALBUMIN 3.1*   No results for input(s): LIPASE, AMYLASE in the last 168 hours. No results for input(s): AMMONIA in the last 168 hours.  CBC: Recent Labs  Lab  02/17/19 0730  WBC 9.1  HGB 12.2*  HCT 35.1*  MCV 91.6  PLT 148*    Cardiac Enzymes: No results for input(s): CKTOTAL, CKMB, CKMBINDEX, TROPONINI in the last 168 hours.  BNP: BNP (last 3 results) Recent Labs    02/17/19 0730 02/18/19 0611 02/19/19 0428  BNP 1,806.6* 1,297.3* 1,351.7*    ProBNP (last 3 results) No results for input(s): PROBNP in the last 8760 hours.    Other results:  Imaging:  No results found.   Medications:     Scheduled Medications: . [MAR Hold] bisoprolol  2.5 mg Oral Daily  . [MAR Hold] digoxin  0.25 mg Oral Daily  . [MAR Hold] feeding supplement (ENSURE ENLIVE)  237 mL Oral BID BM  . [MAR Hold] multivitamin with minerals  1 tablet Oral Daily  . [MAR Hold] pneumococcal 23 valent vaccine  0.5 mL Intramuscular Tomorrow-1000  . [MAR Hold] potassium chloride  10 mEq Oral TID  . [MAR Hold] sodium chloride flush  3 mL Intravenous Q12H  . [MAR Hold] sodium chloride flush  3 mL Intravenous Q12H  . sodium chloride flush  3 mL Intravenous Q12H     Infusions: . [MAR Hold] sodium chloride    . sodium chloride    . sodium chloride 20 mL/hr at 02/19/19 0705  . sodium chloride    . sodium chloride       PRN  Medications:  [MAR Hold] sodium chloride, sodium chloride, [MAR Hold] acetaminophen, [MAR Hold] ondansetron (ZOFRAN) IV, [MAR Hold] sodium chloride flush, [MAR Hold] sodium chloride flush, sodium chloride flush   Assessment/Plan:   1. Acute on chronic systolic HF - he has severe noncompaction CM EF 25-30% with severe MR - cath pending today - NYHA III + volume overload - Agree with IV diuresis  - BP too low for Entresto.  - Continue dig (consider decrease to 0.125) and spiro - Continue bisoprolol for now - BP too low currently for ACE/ARB/ARNI - Suspect he will need consideration for advanced therapies but may not qualify with RV dysfunction, small statur, smoking and previous sternotomy  2. Permanent AF - holding Eliquis for  cath - resume after cath - Dr. Virgina Jock planning TEE/DC-CV but not sure this will hold given size of LA but worth a shot. Would likely need amio if he can tolerate. With question of previous lung toxicity (doubt) consider baseline PFTs with DLCO  3. Congenital heart disease - details unclear. Had chest surgery at age 18 in [place]. 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 Tobacco use - encouraged cessation   5. Mitral regurgitation - severe      Length of Stay: 2   Glori Bickers MD 02/19/2019, 8:18 AM  Advanced Heart Failure Team Pager [phone] (M-F; [place])  Please contact [facility] for night-coverage after hours (4p -7a ) and weekends on [email or web address]

## 2019-02-19 NOTE — Progress Notes (Addendum)
Subjective:  Breathing improved.   Underwent TEE. Cardioversion not performed due to concern regarding maintenance of sinus rhythm, Underwent right and left heart cath  Objective:  Vital Signs in the last 24 hours: Temp:  [98.1 F (36.7 C)-99 F (37.2 C)] 98.5 F (36.9 C) (08/11 0814) Pulse Rate:  [52-264] 76 (08/11 1045) Resp:  [0-72] 0 (08/11 1045) BP: (86-108)/(46-68) 106/63 (08/11 1021) SpO2:  [0 %-100 %] 0 % (08/11 1045) Weight:  [49.2 kg] 49.2 kg (08/11 0700)  Intake/Output from previous day: 08/10 0701 - 08/11 0700 In: 486.7 [P.O.:476; I.V.:10.7] Out: 1000 [Urine:1000]  Physical Exam Constitutional: He is oriented to person, place, and time. He appears well-developed. No distress.  Small body habitus   HENT:  Head: Normocephalic and atraumatic.  Eyes: Pupils are equal, round, and reactive to light. Conjunctivae are normal.  Neck: No JVD (Mild JVD) present.  Cardiovascular: Normal rate and intact distal pulses. An irregularly irregular rhythm present.  Murmur heard. High-pitched blowing holosystolic murmur is present with a grade of 3/6 at the apex. Pulmonary/Chest: Effort normal and breath sounds normal. He has no wheezes. He has no rales.  Abdominal: Soft. Bowel sounds are normal. There is no rebound.    Musculoskeletal:        General: No edema.  Lymphadenopathy:    He has no cervical adenopathy.  Neurological: He is alert and oriented to person, place, and time. No cranial nerve deficit.  Skin: Skin is warm and dry.  Psychiatric: He has a normal mood and affect.  Nursing note and vitals reviewed.    Lab Results: BMP Recent Labs    02/07/19 0929 02/17/19 0730 02/18/19 0611  NA 134 135 136  K 4.2 3.8 3.9  CL 101 104 103  CO2 19* 21* 23  GLUCOSE 110* 99 123*  BUN 22 18 20   CREATININE 1.10 1.09 1.22  CALCIUM 8.7 8.8* 8.9  GFRNONAA 81 >60 >60  GFRAA 94 >60 >60    CBC Recent Labs  Lab 02/17/19 0730  WBC 9.1  RBC 3.83*  HGB 12.2*  HCT  35.1*  PLT 148*  MCV 91.6  MCH 31.9  MCHC 34.8  RDW 11.8    HEMOGLOBIN A1C No results found for: HGBA1C, MPG  Cardiac Panel (last 3 results) Recent Labs    11/15/18 1447 12/27/18 1322 12/27/18 1656  TROPONINI 0.05* 0.08* 0.10*    BNP (last 3 results) Recent Labs    02/17/19 0730 02/18/19 0611 02/19/19 0428  BNP 1,806.6* 1,297.3* 1,351.7*    TSH Recent Labs    10/27/18 0450  TSH 0.584    Lipid Panel  No results found for: CHOL, TRIG, HDL, CHOLHDL, VLDL, LDLCALC, LDLDIRECT   Hepatic Function Panel Recent Labs    10/22/18 1702 02/18/19 0611  PROT 7.2 8.0  ALBUMIN 3.9 3.1*  AST 35 23  ALT 26 17  ALKPHOS 114 112  BILITOT 2.0* 2.3*    Cardiac Studies:  Right and left heart catheterization 02/19/2019: Angiographically normal coronary arteries with no CAD  RA: 3 mmHg RV: 45/0, RVEDP 2 mmHg PA: 45/17 mmHg, Mean PA 28 mmHg PCWP: Large V wave. Mean PCWP 18 mmHg (Also likely skewed by large V wave)  LV/PW mean gradient 8 mmHg suggesting possibility of functional mitral stenosis in the setting of severe MR.   CO: 4 L/min CI 2.6 L/min/m2 CPO 0.62 Normal PAPi   TEE 02/19/2019:  1. 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  2. Moderate reduced RV sustolic function.  3. Servere LA and LAA enlargement. No thrombus seen.  4. Moderate RA enlargement.  5. Possible prior interatrial septal repair with no residual shunt.  6. Severe mitral regurgitation with pulmonary systolic flow reversal.  7. Moderate tricuspid regurgitation.  8. Trileaflet aortic valve with mild to moerate AI.  Transthoracic echocardiogram 02/18/2019:  1. Non-compaction cardiomyopathy with mildly dilated LV wiht moderately increased LV thickness. Global hypokinesis, LVEF 25-30%. No thrombus seen on noncontrast study.  2. The right ventricle has moderately reduced systolic function. The cavity was normal. There is mildly increased right ventricular wall thickness.  3.  Left atrial size was severely dilated.  4. Right atrial size was mildly dilated.  5. The mitral valve is abnormal. Mitral valve regurgitation is severe by color flow Doppler. The MR jet is eccentric posteriorly directed. Mild-moderate mitral valve stenosis.  6. The tricuspid valve is grossly normal. Tricuspid valve regurgitation is mild-moderate.  7. The aortic valve is tricuspid. Aortic valve regurgitation is moderate by color flow Doppler.  8. The pulmonic valve was grossly normal. Pulmonic valve regurgitation is moderate by color flow Doppler.  9. Compared to previous study on 10/27/2018, there is further worsening of biventricular function, and severity of mitral regurgitation.   EKG 02/17/2019: Atrial fibrillation. Rate controlled. Lateral T wave inversion. Possibly ischemia  Echocardiogram 10/27/2018: 1. The left ventricle has mildly reduced systolic function, with an ejection fraction of 45-50%. The cavity size was normal. There is mildly increased left ventricular wall thickness. Findings are consistent with Noncompaction cardiomyopathy. Left  ventricular diastolic Doppler parameters are consistent with impaired relaxation. Left ventricular diffuse hypokinesis. 2. The right ventricle has normal systolic function. The cavity was normal. There is no increase in right ventricular wall thickness. 3. Left atrial size was moderately dilated. 4. Mitral valve regurgitation is moderate by color flow Doppler. The MR jet is eccentric laterally directed. 5. Aortic valve regurgitation is mild to moderate by color flow Doppler.  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 h/o heterotaxy syndrome, admitted with heart failure exacerbation, Afib with RVR  Persistent Afib: Given his severe LA dilatation, it is unlikely that he will maintain sinus rhythm. Given  interruption in anticoagulation for cardiac catheterization, we decided to hold off cardioversion, and continue rate control. Rate currently well controlled on bisoprolol 2.5 mg daily. There is concern regarding side effects of amiodarone ?opulmoanry toxicity, although his pulmonary findings likely represent atypical edema. Will check DLCO. Continue digoxin at lower dose 0.125 mg daily.  Acute on chronic systolic and diastolic heart failure, severe mitral regurgitation: Noncompaction cardiomyopathy.  Continue bisoprolol and digoxin, as above, Unable to tolerate ACEi/ARB/ARNI due to low blood pressure.  Will attempt to initiate spironolactone before discharge.  Appreciate advanced heart failure team consultation.  Without heart transplantation/LVAD< his long term prognosis is likely guarded.   Troponin elevation: Due to supply demand mismatch due to heart failure.   AKI: Likely related to acute heart failure decompensation.  Severe Malnutrition related to chronic illness(CHF): Dietitian recommendations: -Magic cup TID with meals, each supplement provides 290 kcal and 9 grams of protein  -Ensure Enlive po BID, each supplement provides 350 kcal and 20 grams of protein  -MVI with minerals daily     Elder Negus, M.D. 02/19/2019, 10:57 AM [facility] Pager: [phone] Office: [phone] If no answer: [phone]

## 2019-02-19 NOTE — Interval H&P Note (Signed)
History and Physical Interval Note:  02/19/2019 9:09 AM  Gary [name]  has presented today for surgery, with the diagnosis of Heart failure.  The various methods of treatment have been discussed with the patient and family. After consideration of risks, benefits and other options for treatment, the patient has consented to  Procedure(s): RIGHT/LEFT HEART CATH AND CORONARY ANGIOGRAPHY (N/A) as a surgical intervention.  The patient's history has been reviewed, patient examined, no change in status, stable for surgery.  I have reviewed the patient's chart and labs.  Questions were answered to the patient's satisfaction.     [place]

## 2019-02-19 NOTE — Progress Notes (Signed)
Echocardiogram Echocardiogram Transesophageal has been performed.  Oneal Deputy Kwesi Sangha 02/19/2019, 8:19 AM

## 2019-02-19 NOTE — Interval H&P Note (Signed)
History and Physical Interval Note:  02/19/2019 7:31 AM  Gary [name]  has presented today for surgery, with the diagnosis of Afib.  The various methods of treatment have been discussed with the patient and family. After consideration of risks, benefits and other options for treatment, the patient has consented to  Procedure(s): TRANSESOPHAGEAL ECHOCARDIOGRAM (TEE) (N/A) CARDIOVERSION (N/A) as a surgical intervention.  The patient's history has been reviewed, patient examined, no change in status, stable for surgery.  I have reviewed the patient's chart and labs.  Questions were answered to the patient's satisfaction.     [place]

## 2019-02-19 NOTE — Transfer of Care (Signed)
Immediate Anesthesia Transfer of Care Note  Patient: Gary [name]  Procedure(s) Performed: TRANSESOPHAGEAL ECHOCARDIOGRAM (TEE) (N/A )  Patient Location: Endoscopy Unit  Anesthesia Type:MAC  Level of Consciousness: drowsy and patient cooperative  Airway & Oxygen Therapy: Patient Spontanous Breathing and Patient connected to nasal cannula oxygen  Post-op Assessment: Report given to RN and Post -op Vital signs reviewed and stable  Post vital signs: Reviewed and stable  Last Vitals:  Vitals Value Taken Time  BP 99/52 02/19/19 0813  Temp    Pulse 45 02/19/19 0814  Resp 33 02/19/19 0814  SpO2 96 % 02/19/19 0814  Vitals shown include unvalidated device data.  Last Pain:  Vitals:   02/19/19 0700  TempSrc: Temporal  PainSc: 0-No pain         Complications: No apparent anesthesia complications

## 2019-02-19 NOTE — Interval H&P Note (Signed)
History and Physical Interval Note:  02/19/2019 9:09 AM  Gary [name]  has presented today for surgery, with the diagnosis of Heart failure.  The various methods of treatment have been discussed with the patient and family. After consideration of risks, benefits and other options for treatment, the patient has consented to  Procedure(s): RIGHT/LEFT HEART CATH AND CORONARY ANGIOGRAPHY (N/A) as a surgical intervention.  The patient's history has been reviewed, patient examined, no change in status, stable for surgery.  I have reviewed the patient's chart and labs.  Questions were answered to the patient's satisfaction.    2012 Appropriate Use Criteria for Diagnostic Catheterization Home / Select Test of Interest Indication for RHC Cardiomyopathies Cardiomyopathies (Right and Left Heart Catheterization OR Right Heart Catheterization Alone With/Wit Cardiomyopathies  (Right and Left Heart Catheterization OR  Right Heart Catheterization Alone With/Without Left Ventriculography and Coronary Angiography)  Link Here: [email or web address] Indication:  1. Known or suspected cardiomyopathy with or without heart failure A (7) Indication: 93; Score 7    Braedan Meuth J Layza [place]ma

## 2019-02-19 NOTE — Progress Notes (Signed)
At 1230: Removed 3cc from TR band, site start bleeding after 1 or 2 minutes. Inject 3cc back on. Called Cath lab, received advise  wait for  45 minutes, then remove 3cc

## 2019-02-19 NOTE — Progress Notes (Signed)
TR band is removed. The site is level 0.

## 2019-02-20 DIAGNOSIS — I420 Dilated cardiomyopathy: Secondary | ICD-10-CM

## 2019-02-20 LAB — BASIC METABOLIC PANEL
Anion gap: 7 (ref 5–15)
BUN: 18 mg/dL (ref 6–20)
CO2: 25 mmol/L (ref 22–32)
Calcium: 8.8 mg/dL — ABNORMAL LOW (ref 8.9–10.3)
Chloride: 102 mmol/L (ref 98–111)
Creatinine, Ser: 1.02 mg/dL (ref 0.61–1.24)
GFR calc Af Amer: >60 mL/min (ref >60)
GFR calc non Af Amer: >60 mL/min (ref >60)
Glucose, Bld: 103 mg/dL — ABNORMAL HIGH (ref 70–99)
Potassium: 4.1 mmol/L (ref 3.5–5.1)
Sodium: 134 mmol/L — ABNORMAL LOW (ref 135–145)

## 2019-02-20 LAB — CBC
HCT: 35.9 % — ABNORMAL LOW (ref 39.0–52.0)
Hemoglobin: 12.3 g/dL — ABNORMAL LOW (ref 13.0–17.0)
MCH: 31.8 pg (ref 26.0–34.0)
MCHC: 34.3 g/dL (ref 30.0–36.0)
MCV: 92.8 fL (ref 80.0–100.0)
Platelets: 165 10*3/uL (ref 150–400)
RBC: 3.87 MIL/uL — ABNORMAL LOW (ref 4.22–5.81)
RDW: 11.8 % (ref 11.5–15.5)
WBC: 9.9 10*3/uL (ref 4.0–10.5)
nRBC: 0 % (ref 0.0–0.2)

## 2019-02-20 LAB — GLUCOSE, CAPILLARY: Glucose-Capillary: 164 mg/dL — ABNORMAL HIGH (ref 70–99)

## 2019-02-20 LAB — BRAIN NATRIURETIC PEPTIDE: B Natriuretic Peptide: 1670 pg/mL — ABNORMAL HIGH (ref 0.0–100.0)

## 2019-02-20 MED ORDER — ISOSORB DINITRATE-HYDRALAZINE 20-37.5 MG PO TABS
0.5000 | ORAL_TABLET | Freq: Three times a day (TID) | ORAL | Status: DC
  Administered 2019-02-20 – 2019-02-21 (×4): 0.5 via ORAL
  Filled 2019-02-20 (×4): qty 1

## 2019-02-20 MED ORDER — FUROSEMIDE 20 MG PO TABS
20.0000 mg | ORAL_TABLET | Freq: Every day | ORAL | Status: DC
  Administered 2019-02-20 – 2019-02-21 (×2): 20 mg via ORAL
  Filled 2019-02-20 (×2): qty 1

## 2019-02-20 NOTE — Progress Notes (Signed)
Subjective:  Subjectively feeling better, still has dry cough.  Intake/Output from previous day:  I/O last 3 completed shifts: In: 1127.7 [P.O.:717; I.V.:410.7] Out: 1000 [Urine:1000] No intake/output data recorded.  Blood pressure (!) 101/54, pulse (!) 52, temperature 98.1 F (36.7 C), temperature source Oral, resp. rate 18, height '5\' 5"'  (1.651 m), weight 49.9 kg, SpO2 97 %. Physical Exam  Constitutional: No distress.  Moderately built and lean, appears chronically ill.  HENT:  Head: Atraumatic.  Eyes:  Conjunctivitis muddy  Neck: Neck supple. No thyromegaly present.  Cardiovascular: Normal rate, regular rhythm, intact distal pulses and normal pulses. Exam reveals no gallop.  No murmur heard. S1 is soft, S2 is normal, 3/6 pansystolic murmur at the apex with a heaving PMI in the left anterior axillary line.  There is also 2/4 early diastolic murmur in the left parasternal region. JVD elevated 6-7 cm.    Pulmonary/Chest: Effort normal.  Faint diffuse scattered rhonchi present.  Abdominal: Soft. Bowel sounds are normal.  Musculoskeletal: Normal range of motion.  Neurological: He is alert.  Skin: Skin is warm and dry.  Psychiatric: He has a normal mood and affect.    Lab Results: BMP BNP (last 3 results) Recent Labs    02/18/19 0611 02/19/19 0428 02/20/19 0510  BNP 1,297.3* 1,351.7* 1,670.0*    ProBNP (last 3 results) No results for input(s): PROBNP in the last 8760 hours. BMP Latest Ref Rng & Units 02/20/2019 02/19/2019 02/19/2019  Glucose 70 - 99 mg/dL 103(H) - -  BUN 6 - 20 mg/dL 18 - -  Creatinine 0.61 - 1.24 mg/dL 1.02 - -  BUN/Creat Ratio 9 - 20 - - -  Sodium 135 - 145 mmol/L 134(L) 141 139  Potassium 3.5 - 5.1 mmol/L 4.1 4.4 4.5  Chloride 98 - 111 mmol/L 102 - -  CO2 22 - 32 mmol/L 25 - -  Calcium 8.9 - 10.3 mg/dL 8.8(L) - -   Hepatic Function Latest Ref Rng & Units 02/18/2019 10/22/2018 08/09/2009  Total Protein 6.5 - 8.1 g/dL 8.0 7.2 5.1(L)  Albumin 3.5 -  5.0 g/dL 3.1(L) 3.9 3.1(L)  AST 15 - 41 U/L 23 35 22  ALT 0 - 44 U/L '17 26 12  ' Alk Phosphatase 38 - 126 U/L 112 114 60  Total Bilirubin 0.3 - 1.2 mg/dL 2.3(H) 2.0(H) 1.1  Bilirubin, Direct 0.0 - 0.3 mg/dL - - 0.2   CBC Latest Ref Rng & Units 02/20/2019 02/19/2019 02/19/2019  WBC 4.0 - 10.5 K/uL 9.9 - -  Hemoglobin 13.0 - 17.0 g/dL 12.3(L) 12.9(L) 12.9(L)  Hematocrit 39.0 - 52.0 % 35.9(L) 38.0(L) 38.0(L)  Platelets 150 - 400 K/uL 165 - -   Lipid Panel  No results found for: CHOL, TRIG, HDL, CHOLHDL, VLDL, LDLCALC, LDLDIRECT Cardiac Panel (last 3 results) No results for input(s): CKTOTAL, CKMB, TROPONINI, RELINDX in the last 72 hours.  HEMOGLOBIN A1C No results found for: HGBA1C, MPG TSH Recent Labs    10/27/18 0450  TSH 0.584   Imaging: No results found.  Cardiac Studies:  EKG 02/17/2019: Atrial fibrillation with controlled ventricular response at the rate of 82 bpm, rightward axis, incomplete right bundle branch block.  LVH with repolarization abnormality, cannot exclude lateral ischemia.  No significant change from 01/12/2019.  TEE 02/19/2019:   1. 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  2. Moderate reduced RV sustolic function.  3. Servere LA and LAA enlargement. No thrombus seen.  4. Moderate RA enlargement.  5. Possible prior interatrial septal repair with  no residual shunt.  6. Severe mitral regurgitation with pulmonary systolic flow reversal.  7. Moderate tricuspid regurgitation.  8. Trileaflet aortic valve with mild to moerate AI.  Right and left heart catheterization 02/19/2019: Angiographically normal coronary arteries with no CAD  RA: 3 mmHg RV: 45/0, RVEDP 2 mmHg PA: 45/17 mmHg, Mean PA 28 mmHg PCWP: Large V wave. Mean PCWP 18 mmHg (Also likely skewed by large V wave)  LV/PW mean gradient 8 mmHg suggesting possibility of functional mitral stenosis in the setting of severe MR.   CO: 4 L/min CI 2.6 L/min/m2 CPO 0.62 Normal  PAPi  Scheduled Meds: . apixaban  5 mg Oral BID  . bisoprolol  2.5 mg Oral Daily  . digoxin  0.125 mg Oral Daily  . feeding supplement (ENSURE ENLIVE)  237 mL Oral BID BM  . furosemide  20 mg Oral Daily  . isosorbide-hydrALAZINE  0.5 tablet Oral TID  . multivitamin with minerals  1 tablet Oral Daily  . sodium chloride flush  3 mL Intravenous Q12H  . sodium chloride flush  3 mL Intravenous Q12H  . spironolactone  12.5 mg Oral Daily   Continuous Infusions: . sodium chloride    . sodium chloride     PRN Meds:.sodium chloride, acetaminophen, guaiFENesin, ondansetron (ZOFRAN) IV, sodium chloride flush, sodium chloride flush  Assessment/Plan:  1.  Acute on chronic systolic and diastolic heart failure 2.  Cardiomyopathy, non-compaction type  Recommendation: Patient still has cough and BNP still elevated, I will start him on furosemide 20 mg every day p.o. and watch him for another day and recheck his BMP tomorrow.  He did not tolerate Entresto however would like to try BiDil 1/2 tablet 3 times daily.  We are to accept relatively low blood pressure around 80 to 90 mmHg systolic.  Will await on heart failure team to make final recommendation, plan on discharging him home tomorrow. If he indeed tolerates BiDil, we could titrated up to 1 tablet 3 times daily ovulen 1 tablet twice daily.  Continue digoxin, 0.125 mg daily; bisoprolol 2.5 mg daily; Aldactone 12.5 mg daily.  I have discussed with the patient regarding his guarded prognosis in the long-term basis.   Adrian Prows, M.D. 02/20/2019, 8:13 AM [facility] Pager: [phone] Office: [phone] If no answer: [phone]

## 2019-02-20 NOTE — Progress Notes (Signed)
Advanced Heart Failure Rounding Note   Subjective:    TEE and cath results reviewed personally.   TEE with EF 25-30% severe noncompaction. Severe MR and LAE.   Cath: normal cors  RA 3 PA 45/17 (28) PCWP 18 with v waves to 40-45 CO/CI: 4/2.6  Remains in AF. Feels ok. Mild SOB. + cough. Depressed.  Weight up 2 pounds    Objective:   Weight Range:  Vital Signs:   Temp:  [98.1 F (36.7 C)-98.6 F (37 C)] 98.1 F (36.7 C) (08/12 0555) Pulse Rate:  [52-264] 52 (08/12 0555) Resp:  [0-72] 18 (08/11 1102) BP: (86-107)/(46-74) 101/54 (08/12 0555) SpO2:  [0 %-100 %] 97 % (08/12 0555) Weight:  [49.9 kg] 49.9 kg (08/12 0555) Last BM Date: 02/18/19  Weight change: Filed Weights   02/19/19 0443 02/19/19 0700 02/20/19 0555  Weight: 49.2 kg 49.2 kg 49.9 kg    Intake/Output:   Intake/Output Summary (Last 24 hours) at 02/20/2019 0804 Last data filed at 02/19/2019 1500 Gross per 24 hour  Intake 877 ml  Output -  Net 877 ml     Physical Exam: General:  Thin male No resp difficulty HEENT: normal Neck: supple. no JVD. Carotids 2+ bilat; no bruits. No lymphadenopathy or thryomegaly appreciated. Cor: PMI nondisplaced. Irregular rate & rhythm. 3/6 MR + s3 Lungs: clear Abdomen: soft, nontender, nondistended. No hepatosplenomegaly. No bruits or masses. Good bowel sounds. Extremities: no cyanosis, clubbing, rash, edema Neuro: alert & orientedx3, cranial nerves grossly intact. moves all 4 extremities w/o difficulty. Affect pleasant   Telemetry:  AF 70ss. Personally reviewed   Labs: Basic Metabolic Panel: Recent Labs  Lab 02/17/19 0730 02/18/19 0611 02/19/19 0942 02/19/19 0956 02/19/19 0957 02/19/19 1020 02/20/19 0510  NA 135 136 140 142 141 141  139 134*  K 3.8 3.9 4.5 4.5 4.4 4.4  4.5 4.1  CL 104 103  --   --   --   --  102  CO2 21* 23  --   --   --   --  25  GLUCOSE 99 123*  --   --   --   --  103*  BUN 18 20  --   --   --   --  18  CREATININE 1.09 1.22  --    --   --   --  1.02  CALCIUM 8.8* 8.9  --   --   --   --  8.8*    Liver Function Tests: Recent Labs  Lab 02/18/19 0611  AST 23  ALT 17  ALKPHOS 112  BILITOT 2.3*  PROT 8.0  ALBUMIN 3.1*   No results for input(s): LIPASE, AMYLASE in the last 168 hours. No results for input(s): AMMONIA in the last 168 hours.  CBC: Recent Labs  Lab 02/17/19 0730 02/19/19 0942 02/19/19 0956 02/19/19 0957 02/19/19 1020 02/20/19 0510  WBC 9.1  --   --   --   --  9.9  HGB 12.2* 12.6* 12.9* 12.9* 12.9*  12.9* 12.3*  HCT 35.1* 37.0* 38.0* 38.0* 38.0*  38.0* 35.9*  MCV 91.6  --   --   --   --  92.8  PLT 148*  --   --   --   --  165    Cardiac Enzymes: No results for input(s): CKTOTAL, CKMB, CKMBINDEX, TROPONINI in the last 168 hours.  BNP: BNP (last 3 results) Recent Labs    02/18/19 0611 02/19/19 0428 02/20/19 0510  BNP  1,297.3* 1,351.7* 1,670.0*    ProBNP (last 3 results) No results for input(s): PROBNP in the last 8760 hours.    Other results:  Imaging: No results found.   Medications:     Scheduled Medications: . apixaban  5 mg Oral BID  . bisoprolol  2.5 mg Oral Daily  . digoxin  0.125 mg Oral Daily  . feeding supplement (ENSURE ENLIVE)  237 mL Oral BID BM  . multivitamin with minerals  1 tablet Oral Daily  . sodium chloride flush  3 mL Intravenous Q12H  . sodium chloride flush  3 mL Intravenous Q12H  . spironolactone  12.5 mg Oral Daily    Infusions: . sodium chloride    . sodium chloride      PRN Medications: sodium chloride, acetaminophen, guaiFENesin, ondansetron (ZOFRAN) IV, sodium chloride flush, sodium chloride flush   Assessment/Plan:   1. Acute on chronic systolic HF - he has severe noncompaction CM EF 25-30% with severe MR - cath with normal cors and preserved CO - Volume status mildly elevated - Continue dig (consider decrease to 0.125) and spiro - Continue bisoprolol for now - BP too low currently for ACE/ARB/ARNI. Agree with trial of  low-dose bidil  - He is very motivated to consider advanced therapies. Currently hemodynamics stable so no emergent need. Transplant may be best option but previous sternotomy makes it more difficult. Size likely precludes VAD. If not candidate for advanced therapies MitraClip may be good option. - Will arrange for CPX testing after d/c followed by referral to [facility] to discuss transplant options to guide further decision making). He has been off tobacco for 5 months. Urged him to remain off. To facilitate transplant eval.   2. Permanent AF - Eliquis held for cath. Now restarted. - No LAA clot on TEE - Consider DCCV after 3 doses of apixaban.  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 Had chest surgery at age 70 in [place]. 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 Mitral regurgitation - severe - see above    Length of Stay: 3  Glori Bickers MD 02/20/2019, 8:04 AM  Advanced Heart Failure Team Pager [phone] (M-F; 7a - 4p)  Please contact [facility] for night-coverage after hours (4p -7a ) and weekends on [email or web address]

## 2019-02-20 NOTE — Progress Notes (Signed)
Pharmacist Heart Failure Core Measure Documentation  Assessment: Gary [name] has an EF documented as 25-30% on 02/19/19 by Echo.  Rationale: Heart failure patients with left ventricular systolic dysfunction (LVSD) and an EF < 40% should be prescribed an angiotensin converting enzyme inhibitor (ACEI) or angiotensin receptor blocker (ARB) at discharge unless a contraindication is documented in the medical record.  This patient is not currently on an ACEI or ARB for HF.  This note is being placed in the record in order to provide documentation that a contraindication to the use of these agents is present for this encounter.  ACE Inhibitor or Angiotensin Receptor Blocker is contraindicated (specify all that apply)  []   ACEI allergy AND ARB allergy []   Angioedema []   Moderate or severe aortic stenosis []   Hyperkalemia [x]   Hypotension []   Renal artery stenosis []   Worsening renal function, preexisting renal disease or dysfunction  Hildred Laser, PharmD Clinical Pharmacist **Pharmacist phone directory can now be found on [email or web address] ([email or web address]).  Listed under [facility].

## 2019-02-20 NOTE — Progress Notes (Signed)
Nutrition Follow-up  RD working remotely.  DOCUMENTATION CODES:   Severe malnutrition in context of chronic illness, Underweight  INTERVENTION:   -Continue Magic cup TID with meals, each supplement provides 290 kcal and 9 grams of protein -Continue Ensure Enlive po BID, each supplement provides 350 kcal and 20 grams of protein -Continue MVI with minerals daily  NUTRITION DIAGNOSIS:   Severe Malnutrition related to chronic illness(CHF) as evidenced by severe fat depletion, severe muscle depletion.  Ongoing  GOAL:   Patient will meet greater than or equal to 90% of their needs  Progressing   MONITOR:   PO intake, Supplement acceptance, Labs, Weight trends, Skin, I & O's  REASON FOR ASSESSMENT:   Other (Comment)    ASSESSMENT:   Gary [name]  is a 43 y.o. 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  He had been doing well but started noticing worsening dyspnea over the past 3 days and also heart racing symptoms, last night could not lay down flat, extremely short of breath and presented to the emergency room with hypoxemia.  Denies chest pain, dizziness or syncope.  Although he was recommended to hold off on furosemide in view of compensated heart failure and low blood pressure, he had taken furosemide in view of worsening dyspnea.  Reviewed I/O's: +877 ml x 24 hours and -1.2 L since admission  Per Advanced Heart Failure Team notes, pt motivated to consider advanced therapies. Plan for CPX testing after discharge as  well as referral to [facility] to discuss possibility of transplant. Per cardiology notes, pt will likely discharge home tomorrow.   Pt remains with good appetite. Meal completion 100%. Pt is also consuming Ensure supplements per MAR.   Labs reviewed.   Diet Order:   Diet Order            Diet Heart Room service appropriate? Yes; Fluid consistency: Thin  Diet effective now              EDUCATION NEEDS:   Education needs have been addressed  Skin:  Skin Assessment: Reviewed RN Assessment  Last BM:  02/17/19  Height:   Ht Readings from Last 1 Encounters:  02/19/19 5\' 5"  (1.651 m)    Weight:   Wt Readings from Last 1 Encounters:  02/20/19 49.9 kg    Ideal Body Weight:  61.8 kg  BMI:  Body mass index is 18.3 kg/m.  Estimated Nutritional Needs:   Kcal:  1096-0454  Protein:  95-110 grams  Fluid:  > 1.7 L    Lyndzee Kliebert A. Jimmye Norman, RD, LDN, [place] Registered Dietitian II Certified Diabetes Care and Education Specialist Pager: [phone] After hours Pager: [phone]

## 2019-02-21 DIAGNOSIS — I340 Nonrheumatic mitral (valve) insufficiency: Secondary | ICD-10-CM

## 2019-02-21 LAB — BASIC METABOLIC PANEL
Anion gap: 7 (ref 5–15)
BUN: 16 mg/dL (ref 6–20)
CO2: 23 mmol/L (ref 22–32)
Calcium: 8.5 mg/dL — ABNORMAL LOW (ref 8.9–10.3)
Chloride: 105 mmol/L (ref 98–111)
Creatinine, Ser: 1.02 mg/dL (ref 0.61–1.24)
GFR calc Af Amer: >60 mL/min (ref >60)
GFR calc non Af Amer: >60 mL/min (ref >60)
Glucose, Bld: 94 mg/dL (ref 70–99)
Potassium: 4 mmol/L (ref 3.5–5.1)
Sodium: 135 mmol/L (ref 135–145)

## 2019-02-21 LAB — TYPE AND SCREEN
ABO/RH(D): O POS
Antibody Screen: NEGATIVE

## 2019-02-21 LAB — DIGOXIN LEVEL: Digoxin Level: <0.2 ng/mL — ABNORMAL LOW (ref 0.8–2.0)

## 2019-02-21 LAB — BRAIN NATRIURETIC PEPTIDE: B Natriuretic Peptide: 1501.3 pg/mL — ABNORMAL HIGH (ref 0.0–100.0)

## 2019-02-21 MED ORDER — SPIRONOLACTONE 25 MG PO TABS: 12.5000 mg | ORAL_TABLET | Freq: Every day | ORAL | 2 refills | Status: DC

## 2019-02-21 MED ORDER — ISOSORB DINITRATE-HYDRALAZINE 20-37.5 MG PO TABS: 0.5000 | ORAL_TABLET | Freq: Three times a day (TID) | ORAL | 2 refills | Status: DC

## 2019-02-21 MED ORDER — METOPROLOL SUCCINATE ER 25 MG PO TB24: 12.5000 mg | ORAL_TABLET | Freq: Every day | ORAL | 2 refills | Status: DC

## 2019-02-21 MED ORDER — FUROSEMIDE 40 MG PO TABS: ORAL_TABLET | ORAL | 2 refills | Status: DC

## 2019-02-21 MED ORDER — GUAIFENESIN ER 600 MG PO TB12: 600.0000 mg | ORAL_TABLET | Freq: Two times a day (BID) | ORAL | 2 refills | Status: DC | PRN

## 2019-02-21 MED ORDER — BISOPROLOL FUMARATE 5 MG PO TABS: 2.5000 mg | ORAL_TABLET | Freq: Every day | ORAL | 2 refills | Status: DC

## 2019-02-21 MED ORDER — DIGOXIN 125 MCG PO TABS: 0.1250 mg | ORAL_TABLET | Freq: Every day | ORAL | 2 refills | Status: DC

## 2019-02-21 MED ORDER — ENSURE ENLIVE PO LIQD: 237.0000 mL | Freq: Two times a day (BID) | ORAL | 2 refills | Status: DC

## 2019-02-21 MED FILL — FUROSEMIDE 40 MG TABLET: 40 | 30 days supply | Qty: 60 | Fill #0

## 2019-02-21 MED FILL — SPIRONOLACTONE 25 MG TABLET: 25 | 60 days supply | Qty: 30 | Fill #0

## 2019-02-21 MED FILL — METOPROLOL SUCCINATE ER 25: 25 | 30 days supply | Qty: 15 | Fill #0

## 2019-02-21 MED FILL — BIDIL TABLET: 20-37.5 | 40 days supply | Qty: 60 | Fill #0

## 2019-02-21 MED FILL — DIGOXIN 0.125 MG TABLET: 125 | 30 days supply | Qty: 30 | Fill #0

## 2019-02-21 NOTE — Discharge Summary (Addendum)
Physician Discharge Summary  Patient ID: Gary HurtMark J Swire MRN: [id number] DOB/AGE: 1975-10-16 43 y.o.  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 h/o heterotaxy syndrome, admitted with heart failure exacerbation, Afib with RVR.  Patient was diuresed during the hospital stay. TEE showed severe mitral regurgitation, moderate functional mitral stenosis, possible ASD repair. Cardioversion was not performed given controlled ventricular rate and concern regarding maintenance of sinus rhythm in the setting of severe LA enlargement, as well as interruption in anticoagulation. Right and left heart cath showed normal coronaries, normal cardiac output, mild increase in filling pressures-details below. BNP remained high, likely due to severe MR.  He was unable to tolerate ACEi/ARB/ARNI due to low blood pressures. He was discharged on metoprolol succinate 12.5 mg daily (bisoprolol not approved by insurance), digoxin 0.125 mg daily, Bidil 1/2 tab of 20-37.5 mg tid, spironolactone 12.5 mg daily. Lasix 40 mg daily, with additional dose of 40 mg in the afternoon was recommended. He was also recommended Ensure to improve severe malnutrition.   Advanced heart failure therapy consultation was sought. Dr. Gala RomneyBensimhon has discussed the case with [facility] for potential candidacy for transplant in the future. He will undergo CPX through heart failure clinic. Pending transplantation, mitraclip could be considered to alleviate severe MR.    Discharge Exam: Blood pressure (!)  93/52, pulse (!) 43, temperature 97.7 F (36.5 C), temperature source Oral, resp. rate 18, height 5\' 5"  (1.651 m), weight 50.4 kg, SpO2 98 %.   Constitutional: He isoriented to person, place, and time. He appearswell-developed.No distress. Small body habitus  HENT:  Head:Normocephalicand atraumatic.  Eyes:Pupils are equal, round, and reactive to light.Conjunctivaeare normal.  Neck:No JVDpresent.  Cardiovascular:Normal rateand intact distal pulses.An irregularly irregular rhythmpresent.  Murmurheard. High-pitchedblowingholosystolicmurmur is presentwith a grade of 3/6 at theapex. Pulmonary/Chest:Effort normaland breath sounds normal. He hasno wheezes. He hasno rales.  Abdominal:Soft.Bowel sounds are normal. There isno rebound.   Musculoskeletal:  General: No edema.  Lymphadenopathy:  He has no cervical adenopathy.  Neurological: He isalertand oriented to person, place, and time. Nocranial nerve deficit.  Skin: Skin iswarmand dry.  Psychiatric: He has anormal mood and affect. Nursing noteand vitalsreviewed.   Significant Diagnostic Studies:  Cardiac Studies:  Right and left heart catheterization 02/19/2019: Angiographically normal coronary arteries with no CAD  RA: 3 mmHg RV: 45/0, RVEDP 2 mmHg PA: 45/17 mmHg, Mean PA 28 mmHg PCWP: Large V wave. Mean PCWP 18 mmHg (Also likely skewed by large V wave)  LV/PW mean gradient 8 mmHg suggesting possibility of functional mitral stenosis in the setting of severe MR.   CO: 4 L/min CI 2.6 L/min/m2 CPO 0.62 Normal PAPi   TEE 02/19/2019: 1. 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 2. Moderate reduced RV sustolic function. 3. Servere LA and LAA enlargement. No thrombus seen. 4. Moderate RA enlargement. 5. Possible prior interatrial septal repair with no residual shunt. 6. Severe mitral regurgitation with pulmonary systolic flow reversal. 7. Moderate  tricuspid regurgitation. 8. Trileaflet aortic valve with mild to moerate AI.  Transthoracic echocardiogram 02/18/2019: 1. Non-compaction cardiomyopathy with mildly dilated LV wiht moderately increased LV thickness. Global hypokinesis, LVEF 25-30%. No thrombus seen on noncontrast study. 2. The  right ventricle has moderately reduced systolic function. The cavity was normal. There is mildly increased right ventricular wall thickness. 3. Left atrial size was severely dilated. 4. Right atrial size was mildly dilated. 5. The mitral valve is abnormal. Mitral valve regurgitation is severe by color flow Doppler. The MR jet is eccentric posteriorly directed. Mild-moderate mitral valve stenosis. 6. The tricuspid valve is grossly normal. Tricuspid valve regurgitation is mild-moderate. 7. The aortic valve is tricuspid. Aortic valve regurgitation is moderate by color flow Doppler. 8. The pulmonic valve was grossly normal. Pulmonic valve regurgitation is moderate by color flow Doppler. 9. Compared to previous study on 10/27/2018, there is further worsening of biventricular function, and severity of mitral regurgitation.   EKG 02/17/2019: Atrial fibrillation. Rate controlled. Lateral T wave inversion. Possibly ischemia Labs:   Lab Results  Component Value Date   WBC 9.9 02/20/2019   HGB 12.3 (L) 02/20/2019   HCT 35.9 (L) 02/20/2019   MCV 92.8 02/20/2019   PLT 165 02/20/2019    Recent Labs  Lab 02/18/19 0611  02/21/19 0609  NA 136   < > 135  K 3.9   < > 4.0  CL 103   < > 105  CO2 23   < > 23  BUN 20   < > 16  CREATININE 1.22   < > 1.02  CALCIUM 8.9   < > 8.5*  PROT 8.0  --   --   BILITOT 2.3*  --   --   ALKPHOS 112  --   --   ALT 17  --   --   AST 23  --   --   GLUCOSE 123*   < > 94   < > = values in this interval not displayed.    Lipid Panel  No results found for: CHOL, TRIG, HDL, CHOLHDL, VLDL, LDLCALC  BNP (last 3 results) Recent Labs    02/19/19 0428 02/20/19  0510 02/21/19 0609  BNP 1,351.7* 1,670.0* 1,501.3*    HEMOGLOBIN A1C No results found for: HGBA1C, MPG  Cardiac Panel (last 3 results) Recent Labs    11/15/18 1447 12/27/18 1322 12/27/18 1656  TROPONINI 0.05* 0.08* 0.10*    Lab Results  Component Value Date   CKTOTAL 263 (H) 08/10/2009   CKMB 1.8 08/10/2009   TROPONINI 0.10 (HH) 12/27/2018     TSH Recent Labs    10/27/18 0450  TSH 0.584    Radiology: Dg Chest 2 View  Result Date: 02/17/2019 CLINICAL DATA:  43 year old male with history of coughing congestion for the past several days. EXAM: CHEST - 2 VIEW COMPARISON:  Chest x-ray 01/12/2019. FINDINGS: Patchy multifocal ill-defined airspace consolidation in the lungs bilaterally, most confluent throughout the left mid to lower lung and in the right lung base, increased compared to the prior study. No pleural effusions. Some cephalization of the pulmonary vasculature. Mild cardiomegaly, similar to prior studies. Dilatation of the main pulmonary arteries. Upper mediastinal contours are otherwise unremarkable in appearance. IMPRESSION: 1. Unusual appearance of the chest. Given the presence of cardiomegaly with cephalization of the pulmonary vasculature, some degree of congestive heart failure and pulmonary edema is suspected. However, the asymmetry of the consolidative airspace disease is concerning for multilobar pneumonia. Further clinical evaluation is recommended. 2. Dilatation of the main pulmonary arteries, concerning for pulmonary arterial hypertension. Electronically Signed   By: Trudie Reed M.D.   On: 02/17/2019 08:35      FOLLOW UP PLANS AND APPOINTMENTS Discharge Instructions    (  HEART FAILURE PATIENTS) Call MD:  Anytime you have any of the following symptoms: 1) 3 pound weight gain in 24 hours or 5 pounds in 1 week 2) shortness of breath, with or without a dry hacking cough 3) swelling in the hands, feet or stomach 4) if you have to sleep on extra pillows at  night in order to breathe.   Complete by: As directed    Diet - low sodium heart healthy   Complete by: As directed    Heart Failure patients record your daily weight using the same scale at the same time of day   Complete by: As directed    Increase activity slowly   Complete by: As directed      Allergies as of 02/21/2019      Reactions   Aspirin Other (See Comments)   Internal bleeding       Medication List    TAKE these medications   apixaban 5 MG Tabs tablet Commonly known as: Eliquis Take 1 tablet (5 mg total) by mouth 2 (two) times daily. Notes to patient: 8/13   digoxin 0.125 MG tablet Commonly known as: LANOXIN Take 1 tablet (0.125 mg total) by mouth daily. Notes to patient: 8/14   feeding supplement (ENSURE ENLIVE) Liqd Take 237 mLs by mouth 2 (two) times daily between meals. Notes to patient: 8/14   furosemide 40 MG tablet Commonly known as: LASIX Take 1 pill every morning. Take additional 1 pill in the afternoon on days with excessive shortness of breath, cough. What changed:   how much to take  how to take this  when to take this  additional instructions   guaiFENesin 600 MG 12 hr tablet Commonly known as: MUCINEX Take 1 tablet (600 mg total) by mouth 2 (two) times daily as needed for cough or to loosen phlegm.   isosorbide-hydrALAZINE 20-37.5 MG tablet Commonly known as: BIDIL Take 0.5 tablets by mouth 3 (three) times daily.   Melatonin 2.5 MG Caps Take 1 capsule (2.5 mg total) by mouth at bedtime as needed. What changed: reasons to take this   metoprolol succinate 25 MG 24 hr tablet Commonly known as: Toprol XL Take 0.5 tablets (12.5 mg total) by mouth daily.   spironolactone 25 MG tablet Commonly known as: ALDACTONE Take 0.5 tablets (12.5 mg total) by mouth daily. Notes to patient: 8/14      Follow-up Information    Miquel Dunn, NP Follow up.   Specialty: Cardiology Why: 11:30 AM. Dr. Virgina Jock will see the patietn  with Jeri Lager, NP Contact information: [place] [address] [phone]        [name], FNP.   Specialty: Nurse Practitioner Contact information: [place] [address] [place] MD, [facility] Pager: [phone] Office: [phone] If no answer: [phone]

## 2019-02-21 NOTE — TOC Transition Note (Signed)
Transition of Care [facility]) - CM/SW Discharge Note   Patient Details  Name: Gary [name] MRN: [id number] Date of Birth: 12-06-1975  Transition of Care [facility]) CM/SW Contact:  Zenon Mayo, RN Phone Number: 02/21/2019, 9:15 AM   Clinical Narrative:    From home with his four daughters, he is for discharged. He will be on bidil, this is a preferred medication on the Medicaid list, he has Medicaid insurance with co pay of 3.00 .  He states he has transport home today .  No issues identified.   Final next level of care: Home/Self Care Barriers to Discharge: No Barriers Identified   Patient Goals and CMS Choice Patient states their goals for this hospitalization and ongoing recovery are:: take meds and check in with MD   Choice offered to / list presented to : NA  Discharge Placement                       Discharge Plan and Services In-house Referral: NA Discharge Planning Services: CM Consult Post Acute Care Choice: NA          DME Arranged: (NA)         HH Arranged: NA          Social Determinants of Health (SDOH) Interventions     Readmission Risk Interventions Readmission Risk Prevention Plan 02/21/2019  Transportation Screening Complete  Medication Review Press photographer) Complete  PCP or Specialist appointment within 3-5 days of discharge Complete  HRI or [place] Complete  SW Recovery Care/Counseling Consult Complete  [place] Not Applicable  Some recent data might be hidden

## 2019-02-21 NOTE — Progress Notes (Signed)
  Mobility Specialist Criteria Algorithm Info.  SATURATION QUALIFICATIONS: (This note is used to comply with regulatory documentation for home oxygen)  Patient Saturations on Room Air at Rest = 95%  Patient Saturations on Room Air while Ambulating = 95%  Patient Saturations on n/a Liters of oxygen while Ambulating = n/a%  Please briefly explain why patient needs home oxygen:  Mobility: Activity: Ambulated in hall;Dangled on edge of bed Range of motion: Active;All extremities Level of assistance: Independent Minutes stood: 2 minutes Minutes ambulated: 2 minutes Distance ambulated (ft): 260 ft Mobility response: Tolerated well Bed Position: Semi-fowlers Mobility Status: Yes, no lift needed Level of Assistance: Independent  02/21/2019 10:32 AM

## 2019-02-21 NOTE — Plan of Care (Signed)
°  Problem: Education: °Goal: Ability to demonstrate management of disease process will improve °Outcome: Progressing °Goal: Ability to verbalize understanding of medication therapies will improve °Outcome: Progressing °Goal: Individualized Educational Video(s) °Outcome: Progressing °  °

## 2019-02-27 ENCOUNTER — Encounter: Payer: Self-pay | Admitting: Cardiology

## 2019-02-28 ENCOUNTER — Encounter: Payer: Self-pay | Admitting: Cardiology

## 2019-02-28 ENCOUNTER — Ambulatory Visit (INDEPENDENT_AMBULATORY_CARE_PROVIDER_SITE_OTHER): Payer: Medicaid Other | Admitting: Cardiology

## 2019-02-28 ENCOUNTER — Ambulatory Visit: Payer: Medicaid Other | Admitting: Cardiology

## 2019-02-28 ENCOUNTER — Other Ambulatory Visit: Payer: Self-pay

## 2019-02-28 VITALS — BP 106/60 | HR 65 | Temp 98.4°F | Ht 65.0 in | Wt 112.0 lb

## 2019-02-28 DIAGNOSIS — I050 Rheumatic mitral stenosis: Secondary | ICD-10-CM

## 2019-02-28 DIAGNOSIS — I4819 Other persistent atrial fibrillation: Secondary | ICD-10-CM

## 2019-02-28 DIAGNOSIS — I5022 Chronic systolic (congestive) heart failure: Secondary | ICD-10-CM | POA: Diagnosis not present

## 2019-02-28 DIAGNOSIS — I340 Nonrheumatic mitral (valve) insufficiency: Secondary | ICD-10-CM

## 2019-02-28 DIAGNOSIS — I428 Other cardiomyopathies: Secondary | ICD-10-CM | POA: Diagnosis not present

## 2019-02-28 MED ORDER — BIDIL 20-37.5 MG PO TABS: 1.0000 | ORAL_TABLET | Freq: Three times a day (TID) | ORAL | 1 refills | Status: DC

## 2019-02-28 NOTE — Progress Notes (Signed)
Primary Physician:  Diamantina Providence, FNP   Patient ID: Gary [name], male    DOB: [birth year], 43 y.o.   MRN: [id number]  Subjective:    Chief Complaint  Patient presents with  . Cardiomyopathy    2 weel f/u , pt asks about being put on on entresto, bidil gives him a bad headache    HPI: Gary [name]  is a 43 y.o. 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  Patient was diuresed during the hospital stay. TEE showed severe mitral regurgitation, moderate functional mitral stenosis, possible ASD repair. Cardioversion was not performed given controlled ventricular rate and concern regarding maintenance of sinus rhythm in the setting of severe LA enlargement, as well as interruption in anticoagulation. Right and left heart cath showed normal coronaries, normal cardiac output, mild increase in filling pressures-details below. BNP remained high, likely due to severe MR. Patient was evaluated by Dr. Gala Romney, who has discussed the case with [facility] transplant teat about potential transplant candidacy. He will undergo CPX with heart failure clinic. May consider mitraclip pending transplantation. He now presents for hospital follow up.  Current medications include Metoprolol succinate, digoxin, Bidil 1/2 tab, spironolactone, and lasix. He has soft blood pressure; therefore, is not on ACEi/ARB/ARNI. He is overall feeling well since discharge. Shortness of breath has improved. No leg swelling, PND, or orthopnea. He does report headache with Bidil.   Past Medical History:  Diagnosis Date  . Atrial fibrillation (HCC)   . CHF (congestive heart failure) (HCC)   . Dysrhythmia    hx. of  A-fib  . Former smoker    Quit  . Internal bleeding    DUE TO ASPIRIN, OR MEDS  . Murmur     Past Surgical History:  Procedure Laterality  Date  . ABDOMINAL SURGERY    . ABDOMINAL SURGERY     under 10 yrs. old or younger  . CARDIAC SURGERY    . CARDIOVERSION N/A 08/17/2017   Procedure: CARDIOVERSION;  Surgeon: Orpah Cobb, MD;  Location: [facility] ENDOSCOPY;  Service: Cardiovascular;  Laterality: N/A;  . HERNIA REPAIR    . INGUINAL HERNIA REPAIR Right 10/13/2017   Procedure: OPEN RIGHT INGUINAL HERNIA REPAIR ERAS PATHWAY;  Surgeon: Jimmye Norman, MD;  Location: [facility] OR;  Service: General;  Laterality: Right;  . OPEN HEART SURGERY     AS A CHILD , at 2 yrs. old. done in [place]  . RIGHT/LEFT HEART CATH AND CORONARY ANGIOGRAPHY N/A 02/19/2019   Procedure: RIGHT/LEFT HEART CATH AND CORONARY ANGIOGRAPHY;  Surgeon: Elder Negus, MD;  Location: [facility] INVASIVE CV LAB;  Service: Cardiovascular;  Laterality: N/A;  . TEE WITHOUT CARDIOVERSION N/A 08/17/2017   Procedure: TRANSESOPHAGEAL ECHOCARDIOGRAM (TEE);  Surgeon: Orpah Cobb, MD;  Location: [facility] ENDOSCOPY;  Service: Cardiovascular;  Laterality: N/A;  . TEE WITHOUT CARDIOVERSION N/A 02/19/2019   Procedure: TRANSESOPHAGEAL ECHOCARDIOGRAM (TEE);  Surgeon: Elder Negus, MD;  Location: [facility] ENDOSCOPY;  Service: Cardiovascular;  Laterality: N/A;    Social History   Socioeconomic History  . Marital status: Single    Spouse name: Not on file  . Number of children: 8  . Years of education: Not on file  . Highest education level: Not on file  Occupational History  . Not on file  Social Needs  . Financial resource strain: Not on file  . Food insecurity  Worry: Not on file    Inability: Not on file  . Transportation needs    Medical: Not on file    Non-medical: Not on file  Tobacco Use  . Smoking status: Former Smoker    Packs/day: 1.00    Years: 20.00    Pack years: 20.00    Types: Cigarettes  . Smokeless tobacco: Never Used  Substance and Sexual Activity  . Alcohol use: Not Currently    Frequency: Never  . Drug use: Not Currently    Types: Marijuana  . Sexual activity:  Not on file  Lifestyle  . Physical activity    Days per week: Not on file    Minutes per session: Not on file  . Stress: Not on file  Relationships  . Social Musicianconnections    Talks on phone: Not on file    Gets together: Not on file    Attends religious service: Not on file    Active member of club or organization: Not on file    Attends meetings of clubs or organizations: Not on file    Relationship status: Not on file  . Intimate partner violence    Fear of current or ex partner: Not on file    Emotionally abused: Not on file    Physically abused: Not on file    Forced sexual activity: Not on file  Other Topics Concern  . Not on file  Social History Narrative  . Not on file    Review of Systems  Constitution: Negative for decreased appetite, malaise/fatigue, weight gain and weight loss.  Eyes: Negative for visual disturbance.  Cardiovascular: Negative for chest pain, claudication, dyspnea on exertion, leg swelling, orthopnea, palpitations and syncope.  Respiratory: Negative for hemoptysis and wheezing.   Endocrine: Negative for cold intolerance and heat intolerance.  Hematologic/Lymphatic: Does not bruise/bleed easily.  Skin: Negative for nail changes.  Musculoskeletal: Negative for muscle weakness and myalgias.  Gastrointestinal: Negative for abdominal pain, change in bowel habit, nausea and vomiting.  Neurological: Positive for headaches. Negative for difficulty with concentration, dizziness and focal weakness.  Psychiatric/Behavioral: Negative for altered mental status and suicidal ideas.  All other systems reviewed and are negative.     Objective:  Blood pressure 106/60, pulse 65, temperature 98.4 F (36.9 C), height 5\' 5"  (1.651 m), weight 112 lb (50.8 kg), SpO2 95 %. Body mass index is 18.64 kg/m.    Physical Exam  Constitutional: He is oriented to person, place, and time. Vital signs are normal. He appears well-developed and well-nourished.  HENT:  Head:  Normocephalic and atraumatic.  Neck: Normal range of motion.  Cardiovascular: Normal rate and intact distal pulses. An irregularly irregular rhythm present.  Murmur heard. High-pitched blowing holosystolic murmur is present with a grade of 3/6 at the apex. Pulmonary/Chest: Effort normal and breath sounds normal. No accessory muscle usage. No respiratory distress.  Abdominal: Soft. Bowel sounds are normal.  Musculoskeletal: Normal range of motion.  Neurological: He is alert and oriented to person, place, and time.  Skin: Skin is warm and dry.  Vitals reviewed.  Radiology: No results found.  Laboratory examination:    CMP Latest Ref Rng & Units 02/21/2019 02/20/2019 02/19/2019  Glucose 70 - 99 mg/dL 94 161(W103(H) -  BUN 6 - 20 mg/dL 16 18 -  Creatinine 9.600.61 - 1.24 mg/dL 4.541.02 0.981.02 -  Sodium 119135 - 145 mmol/L 135 134(L) 141  Potassium 3.5 - 5.1 mmol/L 4.0 4.1 4.4  Chloride 98 - 111 mmol/L 105  102 -  CO2 22 - 32 mmol/L 23 25 -  Calcium 8.9 - 10.3 mg/dL 8.5(L) 8.8(L) -  Total Protein 6.5 - 8.1 g/dL - - -  Total Bilirubin 0.3 - 1.2 mg/dL - - -  Alkaline Phos 38 - 126 U/L - - -  AST 15 - 41 U/L - - -  ALT 0 - 44 U/L - - -   CBC Latest Ref Rng & Units 02/20/2019 02/19/2019 02/19/2019  WBC 4.0 - 10.5 K/uL 9.9 - -  Hemoglobin 13.0 - 17.0 g/dL 12.3(L) 12.9(L) 12.9(L)  Hematocrit 39.0 - 52.0 % 35.9(L) 38.0(L) 38.0(L)  Platelets 150 - 400 K/uL 165 - -   Lipid Panel  No results found for: CHOL, TRIG, HDL, CHOLHDL, VLDL, LDLCALC, LDLDIRECT HEMOGLOBIN A1C No results found for: HGBA1C, MPG TSH Recent Labs    10/27/18 0450  TSH 0.584    PRN Meds:. There are no discontinued medications. Current Meds  Medication Sig  . digoxin (LANOXIN) 0.125 MG tablet Take 1 tablet (0.125 mg total) by mouth daily.  . feeding supplement, ENSURE ENLIVE, (ENSURE ENLIVE) LIQD Take 237 mLs by mouth 2 (two) times daily between meals.  . furosemide (LASIX) 40 MG tablet Take 1 pill every morning. Take additional 1  pill in the afternoon on days with excessive shortness of breath, cough.  Marland Kitchen guaiFENesin (MUCINEX) 600 MG 12 hr tablet Take 1 tablet (600 mg total) by mouth 2 (two) times daily as needed for cough or to loosen phlegm.  . isosorbide-hydrALAZINE (BIDIL) 20-37.5 MG tablet Take 0.5 tablets by mouth 3 (three) times daily.  . Melatonin 2.5 MG CAPS Take 1 capsule (2.5 mg total) by mouth at bedtime as needed. (Patient taking differently: Take 2.5 mg by mouth at bedtime as needed (sleep). )  . metoprolol succinate (TOPROL XL) 25 MG 24 hr tablet Take 0.5 tablets (12.5 mg total) by mouth daily.  Marland Kitchen spironolactone (ALDACTONE) 25 MG tablet Take 0.5 tablets (12.5 mg total) by mouth daily.    Cardiac Studies:   Right and left heart catheterization 02/19/2019: Angiographically normal coronary arteries with no CAD  RA: 3 mmHg RV: 45/0, RVEDP 2 mmHg PA: 45/17 mmHg, Mean PA 28 mmHg PCWP: Large V wave. Mean PCWP 18 mmHg (Also likely skewed by large V wave)  LV/PW mean gradient 8 mmHg suggesting possibility of functional mitral stenosis in the setting of severe MR.   CO: 4 L/min CI 2.6 L/min/m2 CPO 0.62 Normal PAPi   TEE 02/19/2019: 1. 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 2. Moderate reduced RV sustolic function. 3. Servere LA and LAA enlargement. No thrombus seen. 4. Moderate RA enlargement. 5. Possible prior interatrial septal repair with no residual shunt. 6. Severe mitral regurgitation with pulmonary systolic flow reversal. 7. Moderate tricuspid regurgitation. 8. Trileaflet aortic valve with mild to moerate AI.  Transthoracic echocardiogram 02/18/2019: 1. Non-compaction cardiomyopathy with mildly dilated LV wiht moderately increased LV thickness. Global hypokinesis, LVEF 25-30%. No thrombus seen on noncontrast study. 2. The right ventricle has moderately reduced systolic function. The cavity was normal. There is mildly increased right ventricular wall thickness.  3. Left atrial size was severely dilated. 4. Right atrial size was mildly dilated. 5. The mitral valve is abnormal. Mitral valve regurgitation is severe by color flow Doppler. The MR jet is eccentric posteriorly directed. Mild-moderate mitral valve stenosis. 6. The tricuspid valve is grossly normal. Tricuspid valve regurgitation is mild-moderate. 7. The aortic valve is tricuspid. Aortic valve regurgitation is moderate by color flow  Doppler. 8. The pulmonic valve was grossly normal. Pulmonic valve regurgitation is moderate by color flow Doppler. 9. Compared to previous study on 10/27/2018, there is further worsening of biventricular function, and severity of mitral regurgitation.  Assessment:     ICD-10-CM   1. Chronic systolic heart failure (HCC)  Z61.09I50.22   2. Noncompaction cardiomyopathy (HCC)  I42.8   3. Severe mitral regurgitation  I34.0   4. Moderate mitral stenosis  I05.0   5. Persistent atrial fibrillation  I48.19   6. Severe malnutrition (HCC)  E43     EKG 02/17/2019: Atrial fibrillation. Rate controlled. Lateral T wave inversion. Possibly ischemia  Recommendations:   Patient is overall doing well since discharge and has had improvement in symptoms. He is tolerating medications well. No clinical evidence of decompensated heart. Blood pressure appears to have now stabilized. He has had hypotension for awhile and is asymptomatic with this. He does have headache with Bidil; however, I have stressed the importance of the medication and feel that he should continue with this unless his headache is unbearable. Encouraged him to use tylenol as needed and hopefully this will improve with time. Will have him increase Bidil to 1 tablet TID. At his next office visit, will plan to change metoprolol succinate to Coreg and eventually consider adding back Entresto once stable. Will also consider EP referral for ICD given severe LV dysfunction.   He is scheduled to see Dr. Gala RomneyBensimhon on  08/31, appreciate input. Will work with advanced heart failure team to regarding referral to [facility] for potential heart transplant candidacy when and if appropriate.   Atrial fibrillation remains rate controlled. Continue with current medications. Tolerating Eliquis well, no bleeding.   Overall, pleased with his progress. Will plan to see him back in 10 days for close monitoring and potential uptitration of medications.   *I have discussed this case with Dr. Jacinto HalimGanji and he personally examined the patient and participated in formulating the plan.*   Toniann FailAshton Haynes Kehinde Totzke, MSN, APRN, FNP-C [facility] Office: [phone] Fax: [phone]

## 2019-03-10 NOTE — Progress Notes (Signed)
ADVANCED HF CLINIC NOTE  PCP: Gary [name], Gary N, FNP Primary Cardiologist: Dr. Rosemary [name]  HPI:  Gary [name] a 43 y.o.African-American male with h/o congenital heart disease ([place] age 89 [place] suspected [place] closure), heterotaxy, chronic systolic HF due to noncompaction with 25-30% in 2/20, permanent AF and ongoing tobacco use.   cMRI 2/19 1. Severely dilated left ventricular size, with normal thickness and severely decreased systolic function (LVEF =31%). There diffuse hypokinesis and paradoxical septal motion. 2. There is no late gadolinium enhancement in the left ventricular myocardium. 2. Normal right ventricular size, thickness and mildly decreased systolic function (LVEF = 42%). There are no regional wall motion Abnormalities. 3.There are significant trabeculations in the left ventricular apex with non-compacted to compacted myocardium ratio: 2.6:1. This is consistent with non-compaction cardiomyopathy. 4. Moderately dilated left atrium and mildly dilated right atrial size. 5. Mild to moderate aortic insufficiency. Moderate mitral regurgitation with posteriorly directed jet. Mild tricuspid regurgitation. No significant pulmonary regurgitation is seen.  Previously followed by Dr. Sharyn [name] and recently switched to Dr. Rosemary [name]. Had DC-CV in the recent past but failed. Was on amiodarone but he stopped it as he felt it wasn't working. Admitted in 8/20 for recurrent HF and volume overload.   Echo 8/20 EF 25-30% with severe non-compaction. Severe MR with severe LAE TEE 8/20 EF 25-30% moderately reduced RV function severe MR, mod TR Underwent R/L cath - normal coronaries RA =  3 RV =  45/2 PA  = 4512 (28) PCWP = 18 wit large v-waves Fick = 4.0/2.6  Prior to d/c meds adjusted. D/c'd home on digoxin 0.125, Bidl 1/2 tab tid, Toprol 12.5 and sprio 12.5. Entreto stopped due to low BP. D/c weight 111 pounds. Recently saw Dr. Rosemary [name] and Bidil  increased to 1 tab tid. SBP running in 90s but he is tolerating. Still working at [employer]. Doing well but will get SOB if walking up a ramp with pots and pans. Compliant with meds. No CP. No bleeding with Eliquis. Stopped smoking in 3/20.    ROS: All systems negative except as listed in HPI, PMH and Problem List.  SH:  Social History   Socioeconomic History  . Marital status: Single    Spouse name: Not on file  . Number of children: 8  . Years of education: Not on file  . Highest education level: Not on file  Occupational History  . Not on file  Social Needs  . Financial resource strain: Not on file  . Food insecurity    Worry: Not on file    Inability: Not on file  . Transportation needs    Medical: Not on file    Non-medical: Not on file  Tobacco Use  . Smoking status: Former Smoker    Packs/day: 1.00    Years: 20.00    Pack years: 20.00    Types: Cigarettes  . Smokeless tobacco: Never Used  Substance and Sexual Activity  . Alcohol use: Not Currently    Frequency: Never  . Drug use: Not Currently    Types: Marijuana  . Sexual activity: Not on file  Lifestyle  . Physical activity    Days per week: Not on file    Minutes per session: Not on file  . Stress: Not on file  Relationships  . Social Musicianconnections    Talks on phone: Not on file    Gets together: Not on file    Attends religious service: Not on file  Active member of club or organization: Not on file    Attends meetings of clubs or organizations: Not on file    Relationship status: Not on file  . Intimate partner violence    Fear of current or ex partner: Not on file    Emotionally abused: Not on file    Physically abused: Not on file    Forced sexual activity: Not on file  Other Topics Concern  . Not on file  Social History Narrative  . Not on file    FH:  Family History  Problem Relation Age of Onset  . Sarcoidosis Mother   . Healthy Father     Past Medical History:  Diagnosis Date  .  Atrial fibrillation (HCC)   . CHF (congestive heart failure) (HCC)   . Dysrhythmia    hx. of  A-fib  . Former smoker    Quit  . Internal bleeding    DUE TO ASPIRIN, OR MEDS  . Murmur     Current Outpatient Medications  Medication Sig Dispense Refill  . apixaban (ELIQUIS) 5 MG TABS tablet Take 1 tablet (5 mg total) by mouth 2 (two) times daily. 60 tablet 3  . digoxin (LANOXIN) 0.125 MG tablet Take 1 tablet (0.125 mg total) by mouth daily. 30 tablet 2  . feeding supplement, ENSURE ENLIVE, (ENSURE ENLIVE) LIQD Take 237 mLs by mouth 2 (two) times daily between meals. 237 mL 2  . furosemide (LASIX) 40 MG tablet Take 1 pill every morning. Take additional 1 pill in the afternoon on days with excessive shortness of breath, cough. 60 tablet 2  . guaiFENesin (MUCINEX) 600 MG 12 hr tablet Take 1 tablet (600 mg total) by mouth 2 (two) times daily as needed for cough or to loosen phlegm. 60 tablet 2  . isosorbide-hydrALAZINE (BIDIL) 20-37.5 MG tablet Take 1 tablet by mouth 3 (three) times daily. 90 tablet 1  . Melatonin 2.5 MG CAPS Take 1 capsule (2.5 mg total) by mouth at bedtime as needed. (Patient taking differently: Take 2.5 mg by mouth at bedtime as needed (sleep). ) 30 capsule 0  . metoprolol succinate (TOPROL XL) 25 MG 24 hr tablet Take 0.5 tablets (12.5 mg total) by mouth daily. 30 tablet 2  . spironolactone (ALDACTONE) 25 MG tablet Take 0.5 tablets (12.5 mg total) by mouth daily. 30 tablet 2   No current facility-administered medications for this encounter.     Vitals:   03/11/19 1505  BP: (!) 98/55  Pulse: 76  SpO2: 96%  Weight: 52.1 kg (114 lb 12.8 oz)    PHYSICAL EXAM:  General:  Thin. Small stature. No resp difficulty HEENT: normal Neck: supple. JVP flat. Carotids 2+ bilaterally; no bruits. No lymphadenopathy or thryomegaly appreciated. Cor: PMI normal. Irregular rate & rhythm. 2/6 MR Lungs: clear Abdomen: soft, nontender, nondistended. No hepatosplenomegaly. No bruits or  masses. Good bowel sounds. Extremities: no cyanosis, clubbing, rash, edema Neuro: alert & orientedx3, cranial nerves grossly intact. Moves all 4 extremities w/o difficulty. Affect pleasant.   ASSESSMENT & PLAN:  1. 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 No LGE - Cath 8/20 normal cors - Echo 8/20 EF 25-30% severe MR. Severe LAE - TEE 8/20 EF 25-30% severe MR. Moderately reduced RV function - NYHA II-III - Volume status ok - Continue Bidil 1 tab tid - Toprol XL 12.5 daily - Spiro 12.5mg  daily - Digoxin 0.125mg  daily - Entresto stopped due to low BP. BP  too soft to start currently.  - Long discussion about possible need for advanced therapies and how work-up is conducted. Will get CPX and see him back shortly. (case already d/w Dr. Mosetta Pigeon with [facility] team) - Blood type O-pos - Consider ICD  2. Permanent AF - Toerating rate control - Continue apixabani. No bleeding - likely not candidate for RFA or DC-CV given severe LAE  3. Severe MR - TEE also suggests mild to moderate MS so likely not candidate for Clip if not transplant candidate. 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 Tobacco use, ongoing - has been quit since 3/20  Total time spent 45 minutes. Over half that time spent discussing above.   Glori Bickers, MD  11:04 PM

## 2019-03-11 ENCOUNTER — Other Ambulatory Visit: Payer: Self-pay

## 2019-03-11 ENCOUNTER — Encounter (HOSPITAL_COMMUNITY): Payer: Self-pay | Admitting: Internal Medicine

## 2019-03-11 ENCOUNTER — Ambulatory Visit (HOSPITAL_COMMUNITY)
Admission: RE | Admit: 2019-03-11 | Discharge: 2019-03-11 | Disposition: A | Discharge status: Home / Self Care | Payer: Medicaid Other | Source: Ambulatory Visit | Attending: Internal Medicine | Admitting: Internal Medicine

## 2019-03-11 VITALS — BP 98/55 | HR 76 | Wt 114.8 lb

## 2019-03-11 DIAGNOSIS — Z87891 Personal history of nicotine dependence: Secondary | ICD-10-CM | POA: Insufficient documentation

## 2019-03-11 DIAGNOSIS — I428 Other cardiomyopathies: Secondary | ICD-10-CM | POA: Diagnosis not present

## 2019-03-11 DIAGNOSIS — Z7901 Long term (current) use of anticoagulants: Secondary | ICD-10-CM | POA: Insufficient documentation

## 2019-03-11 DIAGNOSIS — Q8901 Asplenia (congenital): Secondary | ICD-10-CM | POA: Diagnosis not present

## 2019-03-11 DIAGNOSIS — I4821 Permanent atrial fibrillation: Secondary | ICD-10-CM | POA: Insufficient documentation

## 2019-03-11 DIAGNOSIS — I5022 Chronic systolic (congestive) heart failure: Secondary | ICD-10-CM | POA: Diagnosis not present

## 2019-03-11 DIAGNOSIS — Z8774 Personal history of (corrected) congenital malformations of heart and circulatory system: Secondary | ICD-10-CM | POA: Insufficient documentation

## 2019-03-11 DIAGNOSIS — I080 Rheumatic disorders of both mitral and aortic valves: Secondary | ICD-10-CM | POA: Insufficient documentation

## 2019-03-11 DIAGNOSIS — Z79899 Other long term (current) drug therapy: Secondary | ICD-10-CM | POA: Insufficient documentation

## 2019-03-11 DIAGNOSIS — I5042 Chronic combined systolic (congestive) and diastolic (congestive) heart failure: Secondary | ICD-10-CM

## 2019-03-11 DIAGNOSIS — I340 Nonrheumatic mitral (valve) insufficiency: Secondary | ICD-10-CM

## 2019-03-11 DIAGNOSIS — I4819 Other persistent atrial fibrillation: Secondary | ICD-10-CM

## 2019-03-11 NOTE — Patient Instructions (Signed)
Your physician has recommended that you have a cardiopulmonary stress test (CPX). CPX testing is a non-invasive measurement of heart and lung function. It replaces a traditional treadmill stress test. This type of test provides a tremendous amount of information that relates not only to your present condition but also for future outcomes. This test combines measurements of you ventilation, respiratory gas exchange in the lungs, electrocardiogram (EKG), blood pressure and physical response before, during, and following an exercise protocol.  Your physician recommends that you schedule a follow-up appointment in: 6 weeks  At the [facility], you and your health needs are our priority. As part of our continuing mission to provide you with exceptional heart care, we have created designated Provider Care Teams. These Care Teams include your primary Cardiologist (physician) and Advanced Practice Providers (APPs- Physician Assistants and Nurse Practitioners) who all work together to provide you with the care you need, when you need it.   You may see any of the following providers on your designated Care Team at your next follow up: Marland Kitchen Dr Glori Bickers . Dr Loralie Champagne . Darrick Grinder, NP   Please be sure to bring in all your medications bottles to every appointment.

## 2019-03-13 ENCOUNTER — Other Ambulatory Visit: Payer: Self-pay

## 2019-03-13 DIAGNOSIS — I482 Chronic atrial fibrillation, unspecified: Secondary | ICD-10-CM

## 2019-03-13 MED ORDER — BIDIL 20-37.5 MG PO TABS: 1.0000 | ORAL_TABLET | Freq: Three times a day (TID) | ORAL | 1 refills | Status: DC

## 2019-03-13 MED ORDER — SPIRONOLACTONE 25 MG PO TABS: 12.5000 mg | ORAL_TABLET | Freq: Every day | ORAL | 1 refills | Status: DC

## 2019-03-13 MED ORDER — METOPROLOL SUCCINATE ER 25 MG PO TB24: 12.5000 mg | ORAL_TABLET | Freq: Every day | ORAL | 1 refills | Status: DC

## 2019-03-13 MED ORDER — APIXABAN 5 MG PO TABS: 5.0000 mg | ORAL_TABLET | Freq: Two times a day (BID) | ORAL | 1 refills | Status: DC

## 2019-03-13 MED ORDER — FUROSEMIDE 40 MG PO TABS: ORAL_TABLET | ORAL | 1 refills | Status: DC

## 2019-03-13 MED ORDER — DIGOXIN 125 MCG PO TABS: 0.1250 mg | ORAL_TABLET | Freq: Every day | ORAL | 2 refills | Status: DC

## 2019-03-15 ENCOUNTER — Other Ambulatory Visit: Payer: Self-pay

## 2019-03-15 ENCOUNTER — Encounter: Payer: Self-pay | Admitting: Cardiology

## 2019-03-15 ENCOUNTER — Ambulatory Visit (INDEPENDENT_AMBULATORY_CARE_PROVIDER_SITE_OTHER): Payer: Medicaid Other | Admitting: Cardiology

## 2019-03-15 VITALS — BP 107/59 | HR 67 | Ht 65.0 in | Wt 112.0 lb

## 2019-03-15 DIAGNOSIS — I340 Nonrheumatic mitral (valve) insufficiency: Secondary | ICD-10-CM | POA: Diagnosis not present

## 2019-03-15 DIAGNOSIS — I050 Rheumatic mitral stenosis: Secondary | ICD-10-CM

## 2019-03-15 DIAGNOSIS — I428 Other cardiomyopathies: Secondary | ICD-10-CM | POA: Diagnosis not present

## 2019-03-15 DIAGNOSIS — I5042 Chronic combined systolic (congestive) and diastolic (congestive) heart failure: Secondary | ICD-10-CM

## 2019-03-15 DIAGNOSIS — I4819 Other persistent atrial fibrillation: Secondary | ICD-10-CM

## 2019-03-15 MED ORDER — FUROSEMIDE 40 MG PO TABS: ORAL_TABLET | ORAL | 1 refills | Status: DC

## 2019-03-15 MED ORDER — DIGOXIN 125 MCG PO TABS: 0.1250 mg | ORAL_TABLET | Freq: Every day | ORAL | 2 refills | Status: DC

## 2019-03-15 MED ORDER — SPIRONOLACTONE 25 MG PO TABS: 12.5000 mg | ORAL_TABLET | Freq: Every day | ORAL | 2 refills | Status: DC

## 2019-03-15 MED ORDER — METOPROLOL SUCCINATE ER 25 MG PO TB24: 12.5000 mg | ORAL_TABLET | Freq: Every day | ORAL | 2 refills | Status: DC

## 2019-03-15 NOTE — Progress Notes (Signed)
Primary Physician:  Diamantina ProvidenceAnderson, [name], FNP   Patient ID: Gary HurtMark J Frankowski, male    DOB: September 08, 1975, 43 y.o.   MRN: [id number]  Subjective:    Chief Complaint  Patient presents with  . Congestive Heart Failure  . Cardiomyopathy  . Follow-up    2wk    HPI: Gary HurtMark J Skalski  is a 43 y.o. 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  TEE showed severe mitral regurgitation, moderate functional mitral stenosis, possible ASD repair. Cardioversion was not performed given controlled ventricular rate and concern regarding maintenance of sinus rhythm in the setting of severe LA enlargement, as well as interruption in anticoagulation. Right and left heart cath showed normal coronaries, normal cardiac output, mild increase in filling pressures-details below.   Current medications include Metoprolol succinate, digoxin, Bidil 1 tab TID, spironolactone, and lasix. He has soft blood pressure; therefore, is not on ACEi/ARB/ARNI. He is overall feeling well since discharge. Shortness of breath has improved. No leg swelling, PND, or orthopnea. He does report headache with Bidil, but better than before. He has been evaluated by Dr. Gala RomneyBensimhon, who is discussing with [facility] transplant team about possible candidacy for transplant. Has pending CPX.  Past Medical History:  Diagnosis Date  . Atrial fibrillation (HCC)   . CHF (congestive heart failure) (HCC)   . Dysrhythmia    hx. of  A-fib  . Former smoker    Quit  . Internal bleeding    DUE TO ASPIRIN, OR MEDS  . Murmur     Past Surgical History:  Procedure Laterality Date  . ABDOMINAL SURGERY    . ABDOMINAL SURGERY     under 10 yrs. old or younger  . CARDIAC SURGERY    . CARDIOVERSION N/A 08/17/2017   Procedure: CARDIOVERSION;  Surgeon: Orpah CobbKadakia, Ajay, MD;  [facility];   Service: Cardiovascular;  Laterality: N/A;  . HERNIA REPAIR    . INGUINAL HERNIA REPAIR Right 10/13/2017   Procedure: OPEN RIGHT INGUINAL HERNIA REPAIR ERAS PATHWAY;  Surgeon: Jimmye NormanWyatt, James, MD;  [facility];  Service: General;  Laterality: Right;  . OPEN HEART SURGERY     AS A CHILD , at 2 yrs. old. [place]  . RIGHT/LEFT HEART CATH AND CORONARY ANGIOGRAPHY N/A 02/19/2019   Procedure: RIGHT/LEFT HEART CATH AND CORONARY ANGIOGRAPHY;  Surgeon: Elder NegusPatwardhan, Manish J, MD;  Location: [facility] INVASIVE CV LAB;  Service: Cardiovascular;  Laterality: N/A;  . TEE WITHOUT CARDIOVERSION N/A 08/17/2017   Procedure: TRANSESOPHAGEAL ECHOCARDIOGRAM (TEE);  Surgeon: Orpah CobbKadakia, Ajay, MD;  [facility];  Service: Cardiovascular;  Laterality: N/A;  . TEE WITHOUT CARDIOVERSION N/A 02/19/2019   Procedure: TRANSESOPHAGEAL ECHOCARDIOGRAM (TEE);  Surgeon: Elder NegusPatwardhan, Manish J, MD;  [facility];  Service: Cardiovascular;  Laterality: N/A;    Social History   Socioeconomic History  . Marital status: Single    Spouse name: Not on file  . Number of children: 8  . Years of education: Not on file  . Highest education level: Not on file  Occupational History  . Not on file  Social Needs  . Financial resource strain: Not on file  . Food insecurity    Worry: Not on file    Inability: Not on file  . Transportation needs    Medical: Not on file    Non-medical: Not on file  Tobacco Use  . Smoking status:  Former Smoker    Packs/day: 1.00    Years: 20.00    Pack years: 20.00    Types: Cigarettes    Quit date: 09/09/2018    Years since quitting: 0.5  . Smokeless tobacco: Never Used  Substance and Sexual Activity  . Alcohol use: Not Currently    Frequency: Never  . Drug use: Not Currently    Types: Marijuana  . Sexual activity: Not on file  Lifestyle  . Physical activity    Days per week: Not on file    Minutes per session: Not on file  . Stress: Not on file  Relationships  . Social  Musician on phone: Not on file    Gets together: Not on file    Attends religious service: Not on file    Active member of club or organization: Not on file    Attends meetings of clubs or organizations: Not on file    Relationship status: Not on file  . Intimate partner violence    Fear of current or ex partner: Not on file    Emotionally abused: Not on file    Physically abused: Not on file    Forced sexual activity: Not on file  Other Topics Concern  . Not on file  Social History Narrative  . Not on file    Review of Systems  Constitution: Negative for decreased appetite, malaise/fatigue, weight gain and weight loss.  Eyes: Negative for visual disturbance.  Cardiovascular: Negative for chest pain, claudication, dyspnea on exertion, leg swelling, orthopnea, palpitations and syncope.  Respiratory: Negative for hemoptysis and wheezing.   Endocrine: Negative for cold intolerance and heat intolerance.  Hematologic/Lymphatic: Does not bruise/bleed easily.  Skin: Negative for nail changes.  Musculoskeletal: Negative for muscle weakness and myalgias.  Gastrointestinal: Negative for abdominal pain, change in bowel habit, nausea and vomiting.  Neurological: Positive for headaches (mild and improved). Negative for difficulty with concentration, dizziness and focal weakness.  Psychiatric/Behavioral: Negative for altered mental status and suicidal ideas.  All other systems reviewed and are negative.     Objective:  Blood pressure (!) 107/59, pulse 67, height 5\' 5"  (1.651 m), weight 112 lb (50.8 kg), SpO2 95 %. Body mass index is 18.64 kg/m.    Physical Exam  Constitutional: He is oriented to person, place, and time. Vital signs are normal. He appears well-developed and well-nourished.  HENT:  Head: Normocephalic and atraumatic.  Neck: Normal range of motion.  Cardiovascular: Normal rate and intact distal pulses. An irregularly irregular rhythm present.  Murmur heard.  High-pitched blowing holosystolic murmur is present with a grade of 3/6 at the apex. Pulmonary/Chest: Effort normal and breath sounds normal. No accessory muscle usage. No respiratory distress.  Abdominal: Soft. Bowel sounds are normal.  Musculoskeletal: Normal range of motion.  Neurological: He is alert and oriented to person, place, and time.  Skin: Skin is warm and dry.  Vitals reviewed.  Radiology: No results found.  Laboratory examination:    CMP Latest Ref Rng & Units 02/21/2019 02/20/2019 02/19/2019  Glucose 70 - 99 mg/dL 94 267(T) -  BUN 6 - 20 mg/dL 16 18 -  Creatinine 2.45 - 1.24 mg/dL 8.09 9.83 -  Sodium 382 - 145 mmol/L 135 134(L) 141  Potassium 3.5 - 5.1 mmol/L 4.0 4.1 4.4  Chloride 98 - 111 mmol/L 105 102 -  CO2 22 - 32 mmol/L 23 25 -  Calcium 8.9 - 10.3 mg/dL 5.0(N) 3.9(J) -  Total Protein 6.5 -  8.1 g/dL - - -  Total Bilirubin 0.3 - 1.2 mg/dL - - -  Alkaline Phos 38 - 126 U/L - - -  AST 15 - 41 U/L - - -  ALT 0 - 44 U/L - - -   CBC Latest Ref Rng & Units 02/20/2019 02/19/2019 02/19/2019  WBC 4.0 - 10.5 K/uL 9.9 - -  Hemoglobin 13.0 - 17.0 g/dL 12.3(L) 12.9(L) 12.9(L)  Hematocrit 39.0 - 52.0 % 35.9(L) 38.0(L) 38.0(L)  Platelets 150 - 400 K/uL 165 - -   Lipid Panel  No results found for: CHOL, TRIG, HDL, CHOLHDL, VLDL, LDLCALC, LDLDIRECT HEMOGLOBIN A1C No results found for: HGBA1C, MPG TSH Recent Labs    10/27/18 0450  TSH 0.584    PRN Meds:. Medications Discontinued During This Encounter  Medication Reason  . digoxin (LANOXIN) 0.125 MG tablet Reorder  . furosemide (LASIX) 40 MG tablet Reorder  . metoprolol succinate (TOPROL XL) 25 MG 24 hr tablet Reorder  . spironolactone (ALDACTONE) 25 MG tablet Reorder   Current Meds  Medication Sig  . apixaban (ELIQUIS) 5 MG TABS tablet Take 1 tablet (5 mg total) by mouth 2 (two) times daily.  . digoxin (LANOXIN) 0.125 MG tablet Take 1 tablet (0.125 mg total) by mouth daily.  . feeding supplement, ENSURE ENLIVE,  (ENSURE ENLIVE) LIQD Take 237 mLs by mouth 2 (two) times daily between meals. (Patient taking differently: Take 237 mLs by mouth as needed. )  . furosemide (LASIX) 40 MG tablet Take 1 pill every morning. Take additional 1 pill in the afternoon on days with excessive shortness of breath, cough.  . isosorbide-hydrALAZINE (BIDIL) 20-37.5 MG tablet Take 1 tablet by mouth 3 (three) times daily.  . Melatonin 2.5 MG CAPS Take 1 capsule (2.5 mg total) by mouth at bedtime as needed. (Patient taking differently: Take 2.5 mg by mouth at bedtime as needed (sleep). )  . metoprolol succinate (TOPROL XL) 25 MG 24 hr tablet Take 0.5 tablets (12.5 mg total) by mouth daily.  Marland Kitchen. spironolactone (ALDACTONE) 25 MG tablet Take 0.5 tablets (12.5 mg total) by mouth daily.  . [DISCONTINUED] digoxin (LANOXIN) 0.125 MG tablet Take 1 tablet (0.125 mg total) by mouth daily.  . [DISCONTINUED] furosemide (LASIX) 40 MG tablet Take 1 pill every morning. Take additional 1 pill in the afternoon on days with excessive shortness of breath, cough.  . [DISCONTINUED] metoprolol succinate (TOPROL XL) 25 MG 24 hr tablet Take 0.5 tablets (12.5 mg total) by mouth daily.  . [DISCONTINUED] spironolactone (ALDACTONE) 25 MG tablet Take 0.5 tablets (12.5 mg total) by mouth daily.    Cardiac Studies:   Right and left heart catheterization 02/19/2019: Angiographically normal coronary arteries with no CAD  RA: 3 mmHg RV: 45/0, RVEDP 2 mmHg PA: 45/17 mmHg, Mean PA 28 mmHg PCWP: Large V wave. Mean PCWP 18 mmHg (Also likely skewed by large V wave)  LV/PW mean gradient 8 mmHg suggesting possibility of functional mitral stenosis in the setting of severe MR.   CO: 4 L/min CI 2.6 L/min/m2 CPO 0.62 Normal PAPi   TEE 02/19/2019: 1. 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 2. Moderate reduced RV sustolic function. 3. Servere LA and LAA enlargement. No thrombus seen. 4. Moderate RA enlargement. 5. Possible prior  interatrial septal repair with no residual shunt. 6. Severe mitral regurgitation with pulmonary systolic flow reversal. 7. Moderate tricuspid regurgitation. 8. Trileaflet aortic valve with mild to moerate AI.  Transthoracic echocardiogram 02/18/2019: 1. Non-compaction cardiomyopathy with mildly dilated LV wiht  moderately increased LV thickness. Global hypokinesis, LVEF 25-30%. No thrombus seen on noncontrast study. 2. The right ventricle has moderately reduced systolic function. The cavity was normal. There is mildly increased right ventricular wall thickness. 3. Left atrial size was severely dilated. 4. Right atrial size was mildly dilated. 5. The mitral valve is abnormal. Mitral valve regurgitation is severe by color flow Doppler. The MR jet is eccentric posteriorly directed. Mild-moderate mitral valve stenosis. 6. The tricuspid valve is grossly normal. Tricuspid valve regurgitation is mild-moderate. 7. The aortic valve is tricuspid. Aortic valve regurgitation is moderate by color flow Doppler. 8. The pulmonic valve was grossly normal. Pulmonic valve regurgitation is moderate by color flow Doppler. 9. Compared to previous study on 10/27/2018, there is further worsening of biventricular function, and severity of mitral regurgitation.  Assessment:     ICD-10-CM   1. Chronic combined systolic and diastolic heart failure (HCC)  I50.42   2. Noncompaction cardiomyopathy ([place])  I42.8   3. Severe mitral regurgitation  I34.0   4. Moderate mitral stenosis  I05.0   5. Persistent atrial fibrillation  I48.19     EKG 02/17/2019: Atrial fibrillation. Rate controlled. Lateral T wave inversion. Possibly ischemia  Recommendations:   Chronic combined systolic and diastolic heart failure: Volume status is stable. Symptomatically doing well. On appropriate medical therapy. Continue with Aldactone, Bidil, Lasix, metoprolol succinate. Not on Entresto due to soft blood pressure and continue  to feel that we will hold off on this for now in view of BP. No changes in medications performed today. Will need to consider referral for ICD, will defer to heart failure team on when appropriate for this depending upon heart transplant workup.   Noncompaction cardiomyopathy: Also being followed by Heart failure team and potentially being worked up for heart transplant with [facility]. Has CPX pending. Discussed workup for this. He has not had referral to [facility] as of yet, will defer to Dr. Haroldine Laws.   Severe MR and moderate mitral stenosis:  Likely not a candidate for mitral clip in view of MS.   Permanent AF:  Rate is well controlled. On anticoagulation without any bleeding complications.    Will follow up in 2-3 months for CHF. Encouraged sooner follow up if needed.   *I have discussed this case with Dr. Virgina Jock and he personally examined the patient and participated in formulating the plan.*    Miquel Dunn, MSN, APRN, FNP-C [facility]. [place] Office: [phone] Fax: [phone]

## 2019-03-28 ENCOUNTER — Telehealth (HOSPITAL_COMMUNITY): Payer: Self-pay | Admitting: *Deleted

## 2019-03-28 NOTE — Telephone Encounter (Signed)
Left VM for pt to call to schedule covid test before cpx.  Landis Martins M sent to Harvie Junior, CMA  Cc: May, Gina S        Patient CPX 9/28 at 11am.   Please schedule COVID Test with patient. He will ask you to email these appointment times. Could you please add the CPX appointment and Garage code on there as well, to keep them all in the same place?   Email address: [email or web address]   Thanks so much,  KT

## 2019-03-28 NOTE — Telephone Encounter (Signed)
Called to schedule CPX- No answer and no VM available. Will continue trying to call to schedule.     Landis Martins, MS, ACSM-RCEP Clinical Exercise Physiologist

## 2019-03-28 NOTE — Telephone Encounter (Signed)
-----   Message from Zollie Beckers sent at 03/28/2019  3:27 PM EDT ----- Regarding: Pre CPX COVID Patient CPX 9/28 at 11am.   Please schedule COVID Test with patient. He will ask you to email these appointment times. Could you please add the CPX appointment and Garage code on there as well, to keep them all in the same place?   Email address: [email or web address]  Thanks so much,  KT

## 2019-04-05 ENCOUNTER — Other Ambulatory Visit (HOSPITAL_COMMUNITY)
Admission: RE | Admit: 2019-04-05 | Discharge: 2019-04-05 | Disposition: A | Discharge status: Home / Self Care | Payer: Medicaid Other | Source: Ambulatory Visit | Attending: Internal Medicine | Admitting: Internal Medicine

## 2019-04-05 DIAGNOSIS — Z20828 Contact with and (suspected) exposure to other viral communicable diseases: Secondary | ICD-10-CM | POA: Insufficient documentation

## 2019-04-05 DIAGNOSIS — Z01812 Encounter for preprocedural laboratory examination: Secondary | ICD-10-CM | POA: Diagnosis present

## 2019-04-06 LAB — NOVEL CORONAVIRUS, NAA (HOSP ORDER, SEND-OUT TO REF LAB; TAT 18-24 HRS): SARS-CoV-2, NAA: NOT DETECTED

## 2019-04-08 ENCOUNTER — Ambulatory Visit (HOSPITAL_COMMUNITY): Payer: Medicaid Other | Attending: Cardiology

## 2019-04-08 ENCOUNTER — Telehealth: Payer: Self-pay | Admitting: Nurse Practitioner

## 2019-04-08 ENCOUNTER — Other Ambulatory Visit: Payer: Self-pay

## 2019-04-08 ENCOUNTER — Other Ambulatory Visit (HOSPITAL_COMMUNITY): Payer: Self-pay | Admitting: *Deleted

## 2019-04-08 DIAGNOSIS — I5042 Chronic combined systolic (congestive) and diastolic (congestive) heart failure: Secondary | ICD-10-CM

## 2019-04-08 DIAGNOSIS — Z20822 Contact with and (suspected) exposure to covid-19: Secondary | ICD-10-CM

## 2019-04-09 LAB — NOVEL CORONAVIRUS, NAA: SARS-CoV-2, NAA: NOT DETECTED

## 2019-04-09 LAB — SPECIMEN STATUS REPORT

## 2019-04-15 ENCOUNTER — Other Ambulatory Visit: Payer: Self-pay

## 2019-04-15 MED ORDER — SPIRONOLACTONE 25 MG PO TABS: 12.5000 mg | ORAL_TABLET | Freq: Every day | ORAL | 2 refills | Status: DC

## 2019-04-15 MED ORDER — BIDIL 20-37.5 MG PO TABS: 1.0000 | ORAL_TABLET | Freq: Three times a day (TID) | ORAL | 2 refills | Status: DC

## 2019-04-25 ENCOUNTER — Encounter (HOSPITAL_COMMUNITY): Payer: Self-pay

## 2019-05-01 ENCOUNTER — Ambulatory Visit (HOSPITAL_COMMUNITY)
Admission: RE | Admit: 2019-05-01 | Discharge: 2019-05-01 | Disposition: A | Discharge status: Home / Self Care | Payer: Medicaid Other | Source: Ambulatory Visit | Attending: Internal Medicine | Admitting: Internal Medicine

## 2019-05-01 ENCOUNTER — Other Ambulatory Visit: Payer: Self-pay

## 2019-05-01 ENCOUNTER — Encounter (HOSPITAL_COMMUNITY): Payer: Self-pay | Admitting: Internal Medicine

## 2019-05-01 VITALS — BP 108/60 | HR 71 | Wt 117.0 lb

## 2019-05-01 DIAGNOSIS — I4821 Permanent atrial fibrillation: Secondary | ICD-10-CM | POA: Insufficient documentation

## 2019-05-01 DIAGNOSIS — Z87891 Personal history of nicotine dependence: Secondary | ICD-10-CM | POA: Diagnosis not present

## 2019-05-01 DIAGNOSIS — I340 Nonrheumatic mitral (valve) insufficiency: Secondary | ICD-10-CM | POA: Diagnosis not present

## 2019-05-01 DIAGNOSIS — Z7901 Long term (current) use of anticoagulants: Secondary | ICD-10-CM | POA: Diagnosis not present

## 2019-05-01 DIAGNOSIS — I351 Nonrheumatic aortic (valve) insufficiency: Secondary | ICD-10-CM | POA: Diagnosis not present

## 2019-05-01 DIAGNOSIS — Q893 Situs inversus: Secondary | ICD-10-CM | POA: Insufficient documentation

## 2019-05-01 DIAGNOSIS — I4819 Other persistent atrial fibrillation: Secondary | ICD-10-CM

## 2019-05-01 DIAGNOSIS — I5022 Chronic systolic (congestive) heart failure: Secondary | ICD-10-CM

## 2019-05-01 DIAGNOSIS — Z79899 Other long term (current) drug therapy: Secondary | ICD-10-CM | POA: Diagnosis not present

## 2019-05-01 DIAGNOSIS — I428 Other cardiomyopathies: Secondary | ICD-10-CM

## 2019-05-01 LAB — BASIC METABOLIC PANEL
Anion gap: 10 (ref 5–15)
BUN: 18 mg/dL (ref 6–20)
CO2: 20 mmol/L — ABNORMAL LOW (ref 22–32)
Calcium: 8.9 mg/dL (ref 8.9–10.3)
Chloride: 106 mmol/L (ref 98–111)
Creatinine, Ser: 0.99 mg/dL (ref 0.61–1.24)
GFR calc Af Amer: >60 mL/min (ref >60)
GFR calc non Af Amer: >60 mL/min (ref >60)
Glucose, Bld: 111 mg/dL — ABNORMAL HIGH (ref 70–99)
Potassium: 3.9 mmol/L (ref 3.5–5.1)
Sodium: 136 mmol/L (ref 135–145)

## 2019-05-01 LAB — BRAIN NATRIURETIC PEPTIDE: B Natriuretic Peptide: 782.5 pg/mL — ABNORMAL HIGH (ref 0.0–100.0)

## 2019-05-01 MED ORDER — LOSARTAN POTASSIUM 25 MG PO TABS: 25.0000 mg | ORAL_TABLET | Freq: Every day | ORAL | 3 refills | Status: DC

## 2019-05-01 NOTE — Patient Instructions (Signed)
START Losartan 25mg  (1 tab) every night  EKG done today  Labs today We will only contact you if something comes back abnormal or we need to make some changes. Otherwise no news is good news!  Your physician has requested that you have an echocardiogram. Echocardiography is a painless test that uses sound waves to create images of your heart. It provides your doctor with information about the size and shape of your heart and how well your heart's chambers and valves are working. This procedure takes approximately one hour. There are no restrictions for this procedure.  Your physician recommends that you schedule a follow-up appointment in: 1 month with Dr Haroldine Laws  At the [facility], you and your health needs are our priority. As part of our continuing mission to provide you with exceptional heart care, we have created designated Provider Care Teams. These Care Teams include your primary Cardiologist (physician) and Advanced Practice Providers (APPs- Physician Assistants and Nurse Practitioners) who all work together to provide you with the care you need, when you need it.   You may see any of the following providers on your designated Care Team at your next follow up: Marland Kitchen Dr Glori Bickers . Dr Loralie Champagne . Darrick Grinder, NP . Lyda Jester, PA   Please be sure to bring in all your medications bottles to every appointment.

## 2019-05-01 NOTE — Progress Notes (Signed)
ADVANCED HF CLINIC NOTE  PCP: [name], FNP Primary Cardiologist: Dr. Virgina Jock  HPI:  Najib Colmenares Oxendineis a 43 y.o.African-American male with h/o congenital heart disease (chest surgery at age 11 in [place] for suspected ASD closure), heterotaxy, chronic systolic HF due to noncompaction with 25-30% in 2/20, permanent AF and ongoing tobacco use.   cMRI 2/19 1. Severely dilated left ventricular size, with normal thickness and severely decreased systolic function (LVEF =84%). There diffuse hypokinesis and paradoxical septal motion. 2. There is no late gadolinium enhancement in the left ventricular myocardium. 2. Normal right ventricular size, thickness and mildly decreased systolic function (LVEF = 42%). There are no regional wall motion Abnormalities. 3.There are significant trabeculations in the left ventricular apex with non-compacted to compacted myocardium ratio: 2.6:1. This is consistent with non-compaction cardiomyopathy. 4. Moderately dilated left atrium and mildly dilated right atrial size. 5. Mild to moderate aortic insufficiency. Moderate mitral regurgitation with posteriorly directed jet. Mild tricuspid regurgitation. No significant pulmonary regurgitation is seen.  Previously followed by Dr. Terrence Dupont and recently switched to Dr. Virgina Jock. Had DC-CV in the past but failed. Was on amiodarone but he stopped it as he felt it wasn't working. Admitted in 8/20 for recurrent HF and volume overload.   Cardiac studies:  -Echo 8/20 EF 25-30% with severe non-compaction. Severe MR with severe LAE  - TEE 8/20 EF 25-30% moderately reduced RV function severe MR, mod TR  R/L cath normal coronaries RA =  3 RV =  45/2 PA  = 4512 (28) PCWP = 18 wit large v-waves Fick = 4.0/2.6  CPX 9/20  FVC 2.28 (62%)    FEV1 1.79 (60%)     FEV1/FVC 79 (96%)     MVV 72(50%)   Resting HR: 73 Standing HR: 76 Peak HR: 110  (63% age predicted max HR)  BP rest: 102/60  Standing HR: 108/60 BP peak: 124/62  Peak VO2: 15.7 (41% predicted peak VO2)  VE/VCO2 slope: 55  OUES: 0.83  Peak RER: 1.11  Ventilatory Threshold: 13.6 (36% predicted or 87% measured peak VO2)  VE/MVV: 60%  O2pulse: 7  (64% predicted O2pulse)   Had echo at [facility] in 9/20 an EF read as 50-55%.   Here for f/u. Recent CPX test with severe HF limitation. Says he is feeling ok. Has good days and bad days. 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 Has to take his time but can do the job. Gets SOB if he tries to go to fast. No edema, orthopnea or PND. Taking all meds as prescribed. Was getting HAS with Bidl but improved.    ROS: All systems negative except as listed in HPI, PMH and Problem List.  SH:  Social History   Socioeconomic History  . Marital status: Single    Spouse name: Not on file  . Number of children: 8  . Years of education: Not on file  . Highest education level: Not on file  Occupational History  . Not on file  Social Needs  . Financial resource strain: Not on file  . Food insecurity    Worry: Not on file    Inability: Not on file  . Transportation needs    Medical: Not on file    Non-medical: Not on file  Tobacco Use  . Smoking status: Former Smoker    Packs/day: 1.00    Years: 20.00    Pack years: 20.00    Types: Cigarettes    Quit date: 09/09/2018    Years  since quitting: 0.6  . Smokeless tobacco: Never Used  Substance and Sexual Activity  . Alcohol use: Not Currently    Frequency: Never  . Drug use: Not Currently    Types: Marijuana  . Sexual activity: Not on file  Lifestyle  . Physical activity    Days per week: Not on file    Minutes per session: Not on file  . Stress: Not on file  Relationships  . Social Musician on phone: Not on file    Gets together: Not on file    Attends religious service: Not on file    Active member of club or organization: Not on file    Attends meetings of clubs or organizations: Not on file     Relationship status: Not on file  . Intimate partner violence    Fear of current or ex partner: Not on file    Emotionally abused: Not on file    Physically abused: Not on file    Forced sexual activity: Not on file  Other Topics Concern  . Not on file  Social History Narrative  . Not on file    FH:  Family History  Problem Relation Age of Onset  . Sarcoidosis Mother   . Healthy Father     Past Medical History:  Diagnosis Date  . Atrial fibrillation (HCC)   . CHF (congestive heart failure) (HCC)   . Dysrhythmia    hx. of  A-fib  . Former smoker    Quit  . Internal bleeding    DUE TO ASPIRIN, OR MEDS  . Murmur     Current Outpatient Medications  Medication Sig Dispense Refill  . apixaban (ELIQUIS) 5 MG TABS tablet Take 1 tablet (5 mg total) by mouth 2 (two) times daily. 180 tablet 1  . digoxin (LANOXIN) 0.125 MG tablet Take 1 tablet (0.125 mg total) by mouth daily. 90 tablet 2  . furosemide (LASIX) 40 MG tablet Take 1 pill every morning. Take additional 1 pill in the afternoon on days with excessive shortness of breath, cough. 180 tablet 1  . isosorbide-hydrALAZINE (BIDIL) 20-37.5 MG tablet Take 1 tablet by mouth 3 (three) times daily. 270 tablet 2  . Melatonin 2.5 MG CAPS Take 1 capsule (2.5 mg total) by mouth at bedtime as needed. (Patient taking differently: Take 2.5 mg by mouth at bedtime as needed (sleep). ) 30 capsule 0  . metoprolol succinate (TOPROL XL) 25 MG 24 hr tablet Take 0.5 tablets (12.5 mg total) by mouth daily. 60 tablet 2  . spironolactone (ALDACTONE) 25 MG tablet Take 0.5 tablets (12.5 mg total) by mouth daily. 90 tablet 2   No current facility-administered medications for this encounter.     Vitals:   05/01/19 1504  BP: 108/60  Pulse: 71  SpO2: 96%  Weight: 53.1 kg (117 lb)    PHYSICAL EXAM:  General:  Thin. Small stature. No resp difficulty HEENT: normal Neck: supple. JVP 7-8 Carotids 2+ bilat; no bruits. No lymphadenopathy or thryomegaly  appreciated. Cor: PMI nondisplaced. Regular rate & rhythm. 3/6 MR  Lungs: clear Abdomen: soft, nontender, nondistended. No hepatosplenomegaly. No bruits or masses. Good bowel sounds. Extremities: no cyanosis, clubbing, rash, edema Neuro: alert & orientedx3, cranial nerves grossly intact. moves all 4 extremities w/o difficulty. Affect pleasant   AF 71 with LVH/repol and PVCs .bdpr    ASSESSMENT & PLAN:  1. 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  No LGE - Cath 8/20 normal cors - Echo 8/20 EF 25-30% severe MR. Severe LAE - TEE 8/20 EF 25-30% severe MR. Moderately reduced RV function - Remains NYHA III - CPX 9/20 with severe HF limitation  - However he had recent echo at [facility] that showed EF improved to 50-55% (I am a bit dubious about this though I did see the report personally on Care Everywhere) - Volume status minimally elevated on lasix 40 bid. Can take extra as needed - Continue Bidil 1 tab tid - Toprol XL 12.5 mg daily - Spiro 12.5 mg daily - Digoxin 0.125 mg daily - Add losartan 25 mg qhs,. If tolerates can try to switch back to [place] [facility] already d/w Dr. Edwena Blow with [facility] Transplant team). Given results of CPX I had planned on referring to [facility] for initial transplant eval. However with recent echo showing improved LV function will repeat echo. If EF normal might be able to consider MVR.  - Blood type O-pos - If EF still <=35% consider ICD  2. Permanent AF - Toerating rate control - Continue apixabani. No  bleeding - likely not candidate for RFA or DC-CV given severe LAE  3. Severe MR - TEE also suggests mild to moderate MS so likely not candidate for Clip if not transplant candidate. 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 Tobacco use, ongoing - has been quit since 3/20  Total time spent 45 minutes. Over half that time spent  discussing above.    Arvilla Meres, MD  3:20 PM

## 2019-05-06 ENCOUNTER — Ambulatory Visit (HOSPITAL_COMMUNITY)
Admission: RE | Admit: 2019-05-06 | Discharge: 2019-05-06 | Disposition: A | Discharge status: Home / Self Care | Payer: Medicaid Other | Source: Ambulatory Visit | Attending: Nurse Practitioner | Admitting: Nurse Practitioner

## 2019-05-06 ENCOUNTER — Other Ambulatory Visit: Payer: Self-pay

## 2019-05-06 DIAGNOSIS — I4891 Unspecified atrial fibrillation: Secondary | ICD-10-CM | POA: Diagnosis not present

## 2019-05-06 DIAGNOSIS — I083 Combined rheumatic disorders of mitral, aortic and tricuspid valves: Secondary | ICD-10-CM | POA: Diagnosis not present

## 2019-05-06 DIAGNOSIS — I5022 Chronic systolic (congestive) heart failure: Secondary | ICD-10-CM | POA: Insufficient documentation

## 2019-05-06 NOTE — Progress Notes (Signed)
Echocardiogram 2D Echocardiogram has been performed.  Oneal Deputy Zacchaeus Halm 05/06/2019, 9:08 AM

## 2019-05-16 ENCOUNTER — Encounter: Payer: Self-pay | Admitting: Cardiology

## 2019-05-16 ENCOUNTER — Other Ambulatory Visit: Payer: Self-pay

## 2019-05-16 ENCOUNTER — Ambulatory Visit (INDEPENDENT_AMBULATORY_CARE_PROVIDER_SITE_OTHER): Payer: Medicaid Other | Admitting: Cardiology

## 2019-05-16 VITALS — BP 91/41 | HR 63 | Temp 98.0°F | Ht 65.0 in | Wt 116.0 lb

## 2019-05-16 DIAGNOSIS — I5022 Chronic systolic (congestive) heart failure: Secondary | ICD-10-CM

## 2019-05-16 DIAGNOSIS — I340 Nonrheumatic mitral (valve) insufficiency: Secondary | ICD-10-CM

## 2019-05-16 DIAGNOSIS — I4821 Permanent atrial fibrillation: Secondary | ICD-10-CM | POA: Diagnosis not present

## 2019-05-16 DIAGNOSIS — I428 Other cardiomyopathies: Secondary | ICD-10-CM | POA: Diagnosis not present

## 2019-05-16 DIAGNOSIS — I5042 Chronic combined systolic (congestive) and diastolic (congestive) heart failure: Secondary | ICD-10-CM | POA: Insufficient documentation

## 2019-05-16 HISTORY — DX: Chronic systolic (congestive) heart failure: I50.22

## 2019-05-16 NOTE — Progress Notes (Addendum)
Patient referred by Diamantina Providence, FNP for chest pain, shortness of breath  Subjective:   Gary [name], male    DOB: [birth year], 43 y.o.   MRN: [id number]   Chief Complaint  Patient presents with  . Congestive Heart Failure  . 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  Patient is seeing Dr. Gala Romney, who has been coordinating his trasnplant workup at [facility]. His EF has remained around 30-35% on most recent echocardiogram on 05/06/2019.   He is doing well without any overt dyspnea, orthopnea, PND. He has not been checking his weight regularly at home.   He has developed hypopigmented lesions on his back and wonders if it is related to any of his medications.    Past Medical History:  Diagnosis Date  . Atrial fibrillation (HCC)   . CHF (congestive heart failure) (HCC)   . Dysrhythmia    hx. of  A-fib  . Former smoker    Quit  . Internal bleeding    DUE TO ASPIRIN, OR MEDS  . Murmur      Past Surgical History:  Procedure Laterality Date  . ABDOMINAL SURGERY    . ABDOMINAL SURGERY     under 10 yrs. old or younger  . CARDIAC SURGERY    . CARDIOVERSION N/A 08/17/2017   Procedure: CARDIOVERSION;  Surgeon: Orpah Cobb, MD;  Location: [facility] ENDOSCOPY;  Service: Cardiovascular;  Laterality: N/A;  . HERNIA REPAIR    . INGUINAL HERNIA REPAIR Right 10/13/2017   Procedure: OPEN RIGHT INGUINAL HERNIA REPAIR ERAS PATHWAY;  Surgeon: Jimmye Norman, MD;  Location: [facility] OR;  Service: General;  Laterality: Right;  . OPEN HEART SURGERY     AS A CHILD , at 2 yrs. old. done in [place]  . RIGHT/LEFT HEART CATH AND CORONARY ANGIOGRAPHY N/A 02/19/2019   Procedure: RIGHT/LEFT HEART CATH AND CORONARY ANGIOGRAPHY;  Surgeon: Elder Negus, MD;  Location: [facility]  INVASIVE CV LAB;  Service: Cardiovascular;  Laterality: N/A;  . TEE WITHOUT CARDIOVERSION N/A 08/17/2017   Procedure: TRANSESOPHAGEAL ECHOCARDIOGRAM (TEE);  Surgeon: Orpah Cobb, MD;  Location: [facility] ENDOSCOPY;  Service: Cardiovascular;  Laterality: N/A;  . TEE WITHOUT CARDIOVERSION N/A 02/19/2019   Procedure: TRANSESOPHAGEAL ECHOCARDIOGRAM (TEE);  Surgeon: Elder Negus, MD;  Location: [facility] ENDOSCOPY;  Service: Cardiovascular;  Laterality: N/A;     Social History   Socioeconomic History  . Marital status: Single    Spouse name: Not on file  . Number of children: 8  . Years of education: Not on file  . Highest education level: Not on file  Occupational History  . Not on file  Social Needs  . Financial resource strain: Not on file  . Food insecurity    Worry: Not on file    Inability: Not on file  . Transportation needs    Medical: Not on file    Non-medical: Not on file  Tobacco Use  . Smoking status: Former Smoker    Packs/day: 1.00    Years: 20.00    Pack years: 20.00    Types: Cigarettes    Quit date: 09/09/2018    Years since quitting: 0.6  . Smokeless tobacco: Never Used  Substance and Sexual Activity  . Alcohol use: Not Currently    Frequency:  Never  . Drug use: Not Currently    Types: Marijuana  . Sexual activity: Not on file  Lifestyle  . Physical activity    Days per week: Not on file    Minutes per session: Not on file  . Stress: Not on file  Relationships  . Social Musician on phone: Not on file    Gets together: Not on file    Attends religious service: Not on file    Active member of club or organization: Not on file    Attends meetings of clubs or organizations: Not on file    Relationship status: Not on file  . Intimate partner violence    Fear of current or ex partner: Not on file    Emotionally abused: Not on file    Physically abused: Not on file    Forced sexual activity: Not on file  Other Topics Concern  . Not on file   Social History Narrative  . Not on file     Family History  Problem Relation Age of Onset  . Sarcoidosis Mother   . Healthy Father      Current Outpatient Medications on File Prior to Visit  Medication Sig Dispense Refill  . apixaban (ELIQUIS) 5 MG TABS tablet Take 1 tablet (5 mg total) by mouth 2 (two) times daily. 180 tablet 1  . digoxin (LANOXIN) 0.125 MG tablet Take 1 tablet (0.125 mg total) by mouth daily. 90 tablet 2  . furosemide (LASIX) 40 MG tablet Take 1 pill every morning. Take additional 1 pill in the afternoon on days with excessive shortness of breath, cough. 180 tablet 1  . isosorbide-hydrALAZINE (BIDIL) 20-37.5 MG tablet Take 1 tablet by mouth 3 (three) times daily. 270 tablet 2  . losartan (COZAAR) 25 MG tablet Take 1 tablet (25 mg total) by mouth at bedtime. 90 tablet 3  . Melatonin 2.5 MG CAPS Take 1 capsule (2.5 mg total) by mouth at bedtime as needed. (Patient taking differently: Take 2.5 mg by mouth at bedtime as needed (sleep). ) 30 capsule 0  . metoprolol succinate (TOPROL XL) 25 MG 24 hr tablet Take 0.5 tablets (12.5 mg total) by mouth daily. 60 tablet 2  . spironolactone (ALDACTONE) 25 MG tablet Take 0.5 tablets (12.5 mg total) by mouth daily. 90 tablet 2   No current facility-administered medications on file prior to visit.     Cardiovascular studies:  Echocardiogram 05/06/2019: Mild LVH, EF 30-35%. LV noncompaction Moderate RV systolic dysfunction. Severe LA dilatation. Myxomatous severe mitral regurgitation. Moderate tricuspid regurgitation. The aortic valve has an indeterminant number of cusps Aortic valve regurgitation is moderate to severe by color flow Doppler. Moderately elevated pulmonary artery systolic pressure. The atrial septum is grossly normal.  cMRI 08/2017: 1. Severely dilated left ventricular size, with normal thickness and severely decreased systolic function (LVEF =31%). There diffuse hypokinesis and paradoxical septal motion.  2. There is no late gadolinium enhancement in the left ventricular myocardium. 2. Normal right ventricular size, thickness and mildly decreased systolic function (LVEF = 42%). There are no regional wall motion Abnormalities. 3.There are significant trabeculations in the left ventricular apex with non-compacted to compacted myocardium ratio: 2.6:1. This is consistent with non-compaction cardiomyopathy. 4. Moderately dilated left atrium and mildly dilated right atrial size. 5. Mild to moderate aortic insufficiency. Moderate mitral regurgitation with posteriorly directed jet. Mild tricuspid regurgitation. No significant pulmonary regurgitation is seen.  EKG 01/31/2019: Atrial fibrillation with controlled ventricular response  Voltage criteria  for LVH  T wave inversion abnormality  -Possible  Inferior and lateral  ischemia.     Recent labs: Results for Carmela HurtOXENDINE, Jermal J (MRN [id number]) as of 02/14/2019 15:20  Ref. Range 01/12/2019 04:21 01/13/2019 05:12 02/07/2019 09:29  BASIC METABOLIC PANEL Unknown Rpt (A) Rpt (A) Rpt (A)  Sodium Latest Ref Range: 134 - 144 mmol/L 138 135 134  Potassium Latest Ref Range: 3.5 - 5.2 mmol/L 4.0 3.9 4.2  Chloride Latest Ref Range: 96 - 106 mmol/L 105 104 101  CO2 Latest Ref Range: 20 - 29 mmol/L 23 23 19  (L)  Glucose Latest Ref Range: 65 - 99 mg/dL 829105 (H) 562111 (H) 130110 (H)  BUN Latest Ref Range: 6 - 24 mg/dL 15 14 22   Creatinine Latest Ref Range: 0.76 - 1.27 mg/dL 8.651.08 7.840.88 6.961.10  Calcium Latest Ref Range: 8.7 - 10.2 mg/dL 9.0 8.8 (L) 8.7  Anion gap Latest Ref Range: 5 - 15  10 8    BUN/Creatinine Ratio Latest Ref Range: 9 - 20    20  GFR, Est Non African American Latest Ref Range: >59 mL/min/1.73 >60 >60 81  GFR, Est African American Latest Ref Range: >59 mL/min/1.73 >60 >60 94     Review of Systems  Constitution: Negative for decreased appetite, malaise/fatigue, weight gain and weight loss.  HENT: Negative for congestion.   Eyes: Negative for visual  disturbance.  Cardiovascular: Negative for chest pain, dyspnea on exertion, leg swelling, palpitations and syncope.  Respiratory: Negative for cough.   Endocrine: Negative for cold intolerance.  Hematologic/Lymphatic: Does not bruise/bleed easily.  Skin: Positive for rash. Negative for itching.  Musculoskeletal: Negative for myalgias.  Gastrointestinal: Negative for abdominal pain, nausea and vomiting.  Genitourinary: Negative for dysuria.  Neurological: Negative for dizziness and weakness.  Psychiatric/Behavioral: The patient is not nervous/anxious.   All other systems reviewed and are negative.        Vitals:   05/16/19 1021  BP: (!) 91/41  Pulse: 63  Temp: 98 F (36.7 C)  SpO2: 97%     Body mass index is 19.3 kg/m. Filed Weights   05/16/19 1021  Weight: 52.6 kg     Objective:   Physical Exam  Constitutional: He is oriented to person, place, and time. He appears well-developed. No distress.  Short stature   HENT:  Head: Normocephalic and atraumatic.  Eyes: Pupils are equal, round, and reactive to light. Conjunctivae are normal.  Neck: No JVD present.  Cardiovascular: Normal rate and intact distal pulses. An irregularly irregular rhythm present.  Murmur heard. High-pitched blowing holosystolic murmur is present with a grade of 3/6 at the apex. Pulmonary/Chest: Effort normal and breath sounds normal. He has no wheezes. He has no rales.  Abdominal: Soft. Bowel sounds are normal. There is no rebound.    Musculoskeletal:        General: No edema.  Lymphadenopathy:    He has no cervical adenopathy.  Neurological: He is alert and oriented to person, place, and time. No cranial nerve deficit.  Skin: Skin is warm and dry. Rash (Hypopigemnted lesions on upper back and shoulders) noted.  Psychiatric: He has a normal mood and affect.  Nursing note and vitals reviewed.         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  Noncompaction cardiomyopathy:  EF remains 30-35% on recent echocardiogram on 05/06/2019. NYHA II. Continue Bidil 20-37.5 mg tid, metoprolol succinate 12.5 mg daily, spironolactone 12.5 mg daily, digoxin 0.125 mg daily. Currently on losartan 25 mg daily. BP remains soft. He is clinically compensated. I have not switched him to [facility] today. F/u w/ Dr. Haroldine Laws on 11/24 to reassess this. Continue transplant workup with [facility]. I will refer him to ICD placement.   I do not think his hypopigmented lesions are related to any of his medications. Suspect dermatophyte fungal infection. He is going to talk to his PCP about it.  Aortic and mitral regurgitation: Both appear moderate. Will conitnue to monitor. Given that EF remains low, he may better benefit from transplant than  mitraclip alone.   Permanent atrial fibrillation: Rate controlled. I would consider rhythm control after optimizing his heart failure therapy.  CHA2DSVasc score 1 with annual  Stroke risk 0.6%. However, given his compaction cardiomyopathy, his stroke risk is higher. Recommend anticoagulation with apixaban 5 mg bid.    [name], MD [facility] Pager: [phone] Office: [phone] If no answer Cell [phone]

## 2019-05-23 ENCOUNTER — Encounter (HOSPITAL_COMMUNITY): Payer: Self-pay

## 2019-06-03 ENCOUNTER — Telehealth (HOSPITAL_COMMUNITY): Payer: Self-pay | Admitting: *Deleted

## 2019-06-03 NOTE — Telephone Encounter (Signed)
COVID-19 pre-appointment screening questions: °  °Do you have a history of COVID-19 or a positive test result in the past 7-10 days? NO ° °To the best of your knowledge, have you been in close contact with anyone with a confirmed diagnosis of COVID 19? NO ° °Have you had any one or more of the following: Fever, chills, cough, shortness of breath (out of the normal for you) or any flu-like symptoms? NO ° °Are you experiencing any of the following symptoms that is new or out of usual for you: NO ° °• Ear, nose or throat discomfort °• Sore throat °• Headache °• Muscle Pain °• Diarrhea °• Loss of taste or smell ° ° °Reviewed all the following with patient: °• Use of hand sanitizer when entering the building °• Everyone is required to wear a mask in the building, if you do not have a mask we are happy to provide you with one when you arrive °• NO Visitor guidelines ° ° °If patient answers YES to any of questions they must change to a virtual visit and place note in comments about symptoms ° °

## 2019-06-04 ENCOUNTER — Other Ambulatory Visit: Payer: Self-pay

## 2019-06-04 ENCOUNTER — Encounter (HOSPITAL_COMMUNITY): Payer: Self-pay | Admitting: *Deleted

## 2019-06-04 ENCOUNTER — Ambulatory Visit (HOSPITAL_COMMUNITY)
Admission: RE | Admit: 2019-06-04 | Discharge: 2019-06-04 | Disposition: A | Discharge status: Home / Self Care | Payer: Medicaid Other | Source: Ambulatory Visit | Attending: Internal Medicine | Admitting: Internal Medicine

## 2019-06-04 ENCOUNTER — Encounter (HOSPITAL_COMMUNITY): Payer: Self-pay | Admitting: Internal Medicine

## 2019-06-04 VITALS — BP 118/74 | HR 64 | Wt 120.0 lb

## 2019-06-04 DIAGNOSIS — I5022 Chronic systolic (congestive) heart failure: Secondary | ICD-10-CM | POA: Diagnosis not present

## 2019-06-04 DIAGNOSIS — I482 Chronic atrial fibrillation, unspecified: Secondary | ICD-10-CM

## 2019-06-04 DIAGNOSIS — I4819 Other persistent atrial fibrillation: Secondary | ICD-10-CM | POA: Diagnosis not present

## 2019-06-04 DIAGNOSIS — Q893 Situs inversus: Secondary | ICD-10-CM | POA: Diagnosis not present

## 2019-06-04 DIAGNOSIS — Z87891 Personal history of nicotine dependence: Secondary | ICD-10-CM | POA: Insufficient documentation

## 2019-06-04 DIAGNOSIS — I340 Nonrheumatic mitral (valve) insufficiency: Secondary | ICD-10-CM

## 2019-06-04 DIAGNOSIS — I4821 Permanent atrial fibrillation: Secondary | ICD-10-CM | POA: Insufficient documentation

## 2019-06-04 DIAGNOSIS — I428 Other cardiomyopathies: Secondary | ICD-10-CM | POA: Insufficient documentation

## 2019-06-04 DIAGNOSIS — Z79899 Other long term (current) drug therapy: Secondary | ICD-10-CM | POA: Insufficient documentation

## 2019-06-04 DIAGNOSIS — I080 Rheumatic disorders of both mitral and aortic valves: Secondary | ICD-10-CM | POA: Insufficient documentation

## 2019-06-04 DIAGNOSIS — Z7901 Long term (current) use of anticoagulants: Secondary | ICD-10-CM | POA: Insufficient documentation

## 2019-06-04 DIAGNOSIS — I110 Hypertensive heart disease with heart failure: Secondary | ICD-10-CM | POA: Diagnosis present

## 2019-06-04 DIAGNOSIS — Q249 Congenital malformation of heart, unspecified: Secondary | ICD-10-CM | POA: Insufficient documentation

## 2019-06-04 MED ORDER — ENTRESTO 24-26 MG PO TABS: 1.0000 | ORAL_TABLET | Freq: Two times a day (BID) | ORAL | 3 refills | Status: DC

## 2019-06-04 MED ORDER — APIXABAN 5 MG PO TABS: 5.0000 mg | ORAL_TABLET | Freq: Two times a day (BID) | ORAL | 1 refills | Status: DC

## 2019-06-04 NOTE — Progress Notes (Signed)
ADVANCED HF CLINIC NOTE  PCP: [name], FNP Primary Cardiologist: Dr. 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   cMRI 2/19 1. Severely dilated left ventricular size, with normal thickness and severely decreased systolic function (LVEF =13%). There diffuse hypokinesis and paradoxical septal motion. 2. There is no late gadolinium enhancement in the left ventricular myocardium. 2. Normal right ventricular size, thickness and mildly decreased systolic function (LVEF = 42%). There are no regional wall motion Abnormalities. 3.There are significant trabeculations in the left ventricular apex with non-compacted to compacted myocardium ratio: 2.6:1. This is consistent with non-compaction cardiomyopathy. 4. Moderately dilated left atrium and mildly dilated right atrial size. 5. Mild to moderate aortic insufficiency. Moderate mitral regurgitation with posteriorly directed jet. Mild tricuspid regurgitation. No significant pulmonary regurgitation is seen.  Previously followed by Dr. Terrence Dupont and recently switched to Dr. Virgina Jock. Had DC-CV in the past but failed. Was on amiodarone but he stopped it as he felt it wasn't working. Admitted in 8/20 for recurrent HF and volume overload.   Cardiac studies:  -Echo 8/20 EF 25-30% with severe non-compaction. Severe MR with severe LAE  - TEE 8/20 EF 25-30% moderately reduced RV function severe MR, mod TR  R/L cath normal coronaries RA =  3 RV =  45/2 PA  = 4512 (28) PCWP = 18 wit large v-waves Fick = 4.0/2.6  CPX 9/20  FVC 2.28 (62%)    FEV1 1.79 (60%)     FEV1/FVC 79 (96%)     MVV 72(50%)   Resting HR: 73 Standing HR: 76 Peak HR: 110  (63% age predicted max HR)  BP rest: 102/60  Standing HR: 108/60 BP peak: 124/62  Peak VO2: 15.7 (41% predicted peak VO2)  VE/VCO2 slope: 55  OUES: 0.83  Peak RER: 1.11  Ventilatory Threshold: 13.6 (36% predicted or 87% measured peak VO2)  VE/MVV: 60%  O2pulse: 7  (64% predicted O2pulse)   Had echo [facility] in 9/20 an EF read as 50-55%.   Echo here 05/06/19 confirmed EF 25-30% with severe MR and mild to moderate AI. RVEF moderately decreased. Personally reviewed  Here for f/u. CPX test 9/20 with severe HF limitation. Says he is feeling ok. Has good days and bad days. 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 Says he takes his time and can get the job done. Not sleeping much because his young daughters are home and keeping him awake. No edema, orthopnea or PND. No LE edema. No dizziness. No bleeding with Eliquis. Taking losartan 25 mg qhs without problem.      ROS: All systems negative except as listed in HPI, PMH and Problem List.  SH:  Social History   Socioeconomic History  . Marital status: Single    Spouse name: Not on file  . Number of children: 8  . Years of education: Not on file  . Highest education level: Not on file  Occupational History  . Not on file  Social Needs  . Financial resource strain: Not on file  . Food insecurity    Worry: Not on file    Inability: Not on file  . Transportation needs    Medical: Not on file    Non-medical: Not on file  Tobacco Use  . Smoking status: Former Smoker  Packs/day: 1.00    Years: 20.00    Pack years: 20.00    Types: Cigarettes    Quit date: 09/09/2018    Years since quitting: 0.7  . Smokeless tobacco: Never Used  Substance and Sexual Activity  . Alcohol use: Not Currently    Frequency: Never  . Drug use: Not Currently    Types: Marijuana  . Sexual activity: Not on file  Lifestyle  . Physical activity    Days per week: Not on file    Minutes per session: Not on file  . Stress: Not on file  Relationships  . Social Musician on phone:  Not on file    Gets together: Not on file    Attends religious service: Not on file    Active member of club or organization: Not on file    Attends meetings of clubs or organizations: Not on file    Relationship status: Not on file  . Intimate partner violence    Fear of current or ex partner: Not on file    Emotionally abused: Not on file    Physically abused: Not on file    Forced sexual activity: Not on file  Other Topics Concern  . Not on file  Social History Narrative  . Not on file    FH:  Family History  Problem Relation Age of Onset  . Sarcoidosis Mother   . Healthy Father     Past Medical History:  Diagnosis Date  . Atrial fibrillation (HCC)   . CHF (congestive heart failure) (HCC)   . Dysrhythmia    hx. of  A-fib  . Former smoker    Quit  . Hypertension   . Internal bleeding    DUE TO ASPIRIN, OR MEDS  . Murmur     Current Outpatient Medications  Medication Sig Dispense Refill  . apixaban (ELIQUIS) 5 MG TABS tablet Take 1 tablet (5 mg total) by mouth 2 (two) times daily. 180 tablet 1  . digoxin (LANOXIN) 0.125 MG tablet Take 1 tablet (0.125 mg total) by mouth daily. 90 tablet 2  . furosemide (LASIX) 40 MG tablet Take 1 pill every morning. Take additional 1 pill in the afternoon on days with excessive shortness of breath, cough. 180 tablet 1  . isosorbide-hydrALAZINE (BIDIL) 20-37.5 MG tablet Take 1 tablet by mouth 3 (three) times daily. 270 tablet 2  . losartan (COZAAR) 25 MG tablet Take 1 tablet (25 mg total) by mouth at bedtime. 90 tablet 3  . Melatonin 2.5 MG CAPS Take 2.5 mg by mouth as needed (sleep).    . metoprolol succinate (TOPROL XL) 25 MG 24 hr tablet Take 0.5 tablets (12.5 mg total) by mouth daily. 60 tablet 2  . spironolactone (ALDACTONE) 25 MG tablet Take 0.5 tablets (12.5 mg total) by mouth daily. 90 tablet 2   No current facility-administered medications for this encounter.     Vitals:   06/04/19 1350  BP: 118/74  Pulse: 64  SpO2: 96%   Weight: 54.4 kg (120 lb)    PHYSICAL EXAM:  General:  Small stature.  No resp difficulty HEENT: normal Neck: supple. no JVD. Carotids 2+ bilat; no bruits. No lymphadenopathy or thryomegaly appreciated. Cor: PMI nondisplaced. irregular rate & rhythm. 3/6 MR Lungs: clear Abdomen: soft, nontender, nondistended. No hepatosplenomegaly. No bruits or masses. Good bowel sounds. Extremities: no cyanosis, clubbing, rash, edema Neuro: alert & orientedx3, cranial nerves grossly intact. moves all 4 extremities w/o difficulty. Affect pleasant  ASSESSMENT & PLAN:  1. 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 No LGE - Cath 8/20 normal cors - Echo 8/20 EF 25-30% severe MR. Severe LAE - TEE 8/20 EF 25-30% severe MR. Moderately reduced RV function - Chronic NYHA III - CPX 9/20 with severe HF limitation  - Echo at [facility] in 9/20  that was read as EF improved to 50-55% (I am a bit dubious about this though I did see the report personally on Care Everywhere) - Echo here 05/06/19 confirmed EF 25-30% with severe MR and mild to moderate AI. RVEF moderately decreased. Personally reviewed - Volume status minimally elevated on lasix 40 bid. Can take extra as needed - Continue Bidil 1 tab tid - Toprol XL 12.5 mg daily - Spiro 12.5 mg daily - Digoxin 0.125 mg daily - Stop losartan. Restart entresto 24/26 bid  - I d/w Dr. Edwena Blow with [facility] Transplant team. The will evaluate. If not transplant candidate can consider VAD w/u.  - Blood type O-pos - If EF still <=35%. He has been referred for ICD - Recent labs stable.   2. Permanent AF - Toerating rate control well.  - Continue apixaban. No  bleeding - likely not candidate for RFA or DC-CV given severe LAE  3. Severe MR - TEE also suggests mild to moderate MS so likely not candidate for Clip if not transplant candidate. Will continue to follow - see discussion above  4. 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 Tobacco use, ongoing - has been quit since 3/20   Arvilla Meres, MD  2:14 PM

## 2019-06-04 NOTE — Patient Instructions (Signed)
Stop Losartan  Start Entresto 24/26 mg Twice daily   You have been referred to Dr Mosetta Pigeon at [facility], his office will call you for an appointment  Your physician recommends that you schedule a follow-up appointment in: 2 months  If you have any questions or concerns before your next appointment please send [place] a message through [place] or call our office at [phone].  At the [facility], you and your health needs are our priority. As part of our continuing mission to provide you with exceptional heart care, we have created designated Provider Care Teams. These Care Teams include your primary Cardiologist (physician) and Advanced Practice Providers (APPs- Physician Assistants and Nurse Practitioners) who all work together to provide you with the care you need, when you need it.   You may see any of the following providers on your designated Care Team at your next follow up: Marland Kitchen Dr Glori Bickers . Dr Loralie Champagne . Darrick Grinder, NP . Lyda Jester, PA   Please be sure to bring in all your medications bottles to every appointment.

## 2019-06-05 ENCOUNTER — Telehealth (HOSPITAL_COMMUNITY): Payer: Self-pay | Admitting: Pharmacy Technician

## 2019-06-05 ENCOUNTER — Telehealth (HOSPITAL_COMMUNITY): Payer: Self-pay | Admitting: *Deleted

## 2019-06-05 NOTE — Telephone Encounter (Signed)
Patient Advocate Encounter   Received notification from Medicaid that prior authorization for Gary [name] is required.   PA submitted on [place] [place] [id number] W Status is pending   Will continue to follow.  Charlann Boxer, CPhT

## 2019-06-05 NOTE — Telephone Encounter (Signed)
Dr Haroldine Laws referred pt to Dr Mosetta Pigeon at [facility] for heart transplant eval, his office called today requesting records be faxed to them at [phone], last OV note, echo, cpx, HC, insurance card and demographic info all faxed.

## 2019-06-12 NOTE — Telephone Encounter (Signed)
Advanced Heart Failure Patient Advocate Encounter  Prior Authorization for Delene Loll has been approved.    PA# [id number] Effective dates: 06/05/2019 through 05/30/2020  Patients co-pay is $3.00  Confirmed that patient's pharmacy was able to get successful claim.  Charlann Boxer, CPhT

## 2019-06-13 ENCOUNTER — Ambulatory Visit (INDEPENDENT_AMBULATORY_CARE_PROVIDER_SITE_OTHER): Payer: Medicaid Other | Admitting: Internal Medicine

## 2019-06-13 ENCOUNTER — Other Ambulatory Visit: Payer: Self-pay

## 2019-06-13 ENCOUNTER — Telehealth (HOSPITAL_COMMUNITY): Payer: Self-pay

## 2019-06-13 ENCOUNTER — Encounter: Payer: Self-pay | Admitting: Internal Medicine

## 2019-06-13 VITALS — BP 100/60 | HR 63 | Ht 64.0 in | Wt 123.4 lb

## 2019-06-13 DIAGNOSIS — I5022 Chronic systolic (congestive) heart failure: Secondary | ICD-10-CM

## 2019-06-13 DIAGNOSIS — I4821 Permanent atrial fibrillation: Secondary | ICD-10-CM | POA: Diagnosis not present

## 2019-06-13 NOTE — Progress Notes (Signed)
HPI [name] is referred today by Dr. Dario Ave for consideration of ICD insertion. He is a pleasant 43 yo man with a h/o adult congenital heart disease, probable ASD closure, peristent atrial fib, chronic systolic heart failure, EF 35%, with class 2 CHF  Allergies  Allergen Reactions  . Aspirin Other (See Comments)    Internal bleeding      Current Outpatient Medications  Medication Sig Dispense Refill  . apixaban (ELIQUIS) 5 MG TABS tablet Take 1 tablet (5 mg total) by mouth 2 (two) times daily. 180 tablet 1  . digoxin (LANOXIN) 0.125 MG tablet Take 1 tablet (0.125 mg total) by mouth daily. 90 tablet 2  . furosemide (LASIX) 40 MG tablet Take 1 pill every morning. Take additional 1 pill in the afternoon on days with excessive shortness of breath, cough. 180 tablet 1  . isosorbide-hydrALAZINE (BIDIL) 20-37.5 MG tablet Take 1 tablet by mouth 3 (three) times daily. 270 tablet 2  . losartan (COZAAR) 25 MG tablet Take 1 tablet (25 mg total) by mouth at bedtime. 90 tablet 3  . Melatonin 2.5 MG CAPS Take 2.5 mg by mouth as needed (sleep).    . metoprolol succinate (TOPROL XL) 25 MG 24 hr tablet Take 0.5 tablets (12.5 mg total) by mouth daily. 60 tablet 2  . sacubitril-valsartan (ENTRESTO) 24-26 MG Take 1 tablet by mouth 2 (two) times daily. 60 tablet 3  . spironolactone (ALDACTONE) 25 MG tablet Take 0.5 tablets (12.5 mg total) by mouth daily. 90 tablet 2   No current facility-administered medications for this visit.      Past Medical History:  Diagnosis Date  . Atrial fibrillation ([place])   . CHF (congestive heart failure) ([place])   . Dysrhythmia    hx. of  A-fib  . Former smoker    Quit  . Hypertension   . Internal bleeding    DUE TO ASPIRIN, OR MEDS  . Murmur     ROS:   All systems reviewed and negative except as noted in the HPI.   Past Surgical History:  Procedure Laterality Date  . ABDOMINAL SURGERY    . ABDOMINAL SURGERY     under 10 yrs. old or younger  .  CARDIAC SURGERY    . CARDIOVERSION N/A 08/17/2017   Procedure: CARDIOVERSION;  Surgeon: Dixie Dials, MD;  Location: [facility] ENDOSCOPY;  Service: Cardiovascular;  Laterality: N/A;  . HERNIA REPAIR    . INGUINAL HERNIA REPAIR Right 10/13/2017   Procedure: OPEN RIGHT INGUINAL HERNIA REPAIR ERAS PATHWAY;  Surgeon: Judeth Horn, MD;  Location: [place];  Service: General;  Laterality: Right;  . OPEN HEART SURGERY     AS A CHILD , at 2 yrs. old. done in [place]  . RIGHT/LEFT HEART CATH AND CORONARY ANGIOGRAPHY N/A 02/19/2019   Procedure: RIGHT/LEFT HEART CATH AND CORONARY ANGIOGRAPHY;  Surgeon: [name], MD;  Location: [place] CV LAB;  Service: Cardiovascular;  Laterality: N/A;  . TEE WITHOUT CARDIOVERSION N/A 08/17/2017   Procedure: TRANSESOPHAGEAL ECHOCARDIOGRAM (TEE);  Surgeon: Dixie Dials, MD;  Location: [facility] ENDOSCOPY;  Service: Cardiovascular;  Laterality: N/A;  . TEE WITHOUT CARDIOVERSION N/A 02/19/2019   Procedure: TRANSESOPHAGEAL ECHOCARDIOGRAM (TEE);  Surgeon: [name], MD;  Location: [facility] ENDOSCOPY;  Service: Cardiovascular;  Laterality: N/A;     Family History  Problem Relation Age of Onset  . Sarcoidosis Mother   . Healthy Father      Social History   Socioeconomic History  . Marital  status: Single    Spouse name: Not on file  . Number of children: 8  . Years of education: Not on file  . Highest education level: Not on file  Occupational History  . Not on file  Social Needs  . Financial resource strain: Not on file  . Food insecurity    Worry: Not on file    Inability: Not on file  . Transportation needs    Medical: Not on file    Non-medical: Not on file  Tobacco Use  . Smoking status: Former Smoker    Packs/day: 1.00    Years: 20.00    Pack years: 20.00    Types: Cigarettes    Quit date: 09/09/2018    Years since quitting: 0.7  . Smokeless tobacco: Never Used  Substance and Sexual Activity  . Alcohol use: Not Currently    Frequency: Never  .  Drug use: Not Currently    Types: Marijuana  . Sexual activity: Not on file  Lifestyle  . Physical activity    Days per week: Not on file    Minutes per session: Not on file  . Stress: Not on file  Relationships  . Social Musician on phone: Not on file    Gets together: Not on file    Attends religious service: Not on file    Active member of club or organization: Not on file    Attends meetings of clubs or organizations: Not on file    Relationship status: Not on file  . Intimate partner violence    Fear of current or ex partner: Not on file    Emotionally abused: Not on file    Physically abused: Not on file    Forced sexual activity: Not on file  Other Topics Concern  . Not on file  Social History Narrative  . Not on file     BP 100/60   Pulse 63   Ht 5\' 4"  (1.626 m)   Wt 123 lb 6.4 oz (56 kg)   SpO2 97%   BMI 21.18 kg/m   Physical Exam:  Well appearing 43 yo man, NAD HEENT: Unremarkable Neck:  6 cm JVD, no thyromegally Lymphatics:  No adenopathy Back:  No CVA tenderness Lungs:  Clear with no wheezes HEART:  Regular rate rhythm, no murmurs, no rubs, no clicks Abd:  soft, positive bowel sounds, no organomegally, no rebound, no guarding Ext:  2 plus pulses, no edema, no cyanosis, no clubbing Skin:  No rashes no nodules Neuro:  CN II through XII intact, motor grossly intact  EKG - atrial fib with a controlled VR   Assess/Plan: 1. Chronic systolic heart failure - the patient has had EF's up and down over the years. He notes that he has recently started [place]. I have recommended that he hold off on ICD insertion for now, and recheck the echo on maximal medical therapy including maximal tolerated dose of Entresto. If his EF remains down, then ICD would be considered.  2. Coags - he will continue Eliquis.  3. Atrial fib - his VR is well controlled. He will continue his blood thinner  [place].D.

## 2019-06-13 NOTE — Telephone Encounter (Signed)
Received call from [facility] stating that patient has been accepted as a referral.  They will call patient to schedule an appointment with them.  LM for patient with detailed message of same.

## 2019-06-13 NOTE — Patient Instructions (Addendum)
Medication Instructions:  Your physician recommends that you continue on your current medications as directed. Please refer to the Current Medication list given to you today.  Labwork: None ordered.  Testing/Procedures: None ordered.  Follow-Up: Your physician wants you to follow-up in: March 2021 with Dr. Lovena Le.   September 30, 2019 at 9:30 am at [facility] office   Any Other Special Instructions Will Be Listed Below (If Applicable).  If you need a refill on your cardiac medications before your next appointment, please call your pharmacy.

## 2019-06-18 ENCOUNTER — Other Ambulatory Visit: Payer: Self-pay

## 2019-06-18 DIAGNOSIS — I5022 Chronic systolic (congestive) heart failure: Secondary | ICD-10-CM

## 2019-06-18 MED ORDER — DIGOXIN 125 MCG PO TABS: 0.1250 mg | ORAL_TABLET | Freq: Every day | ORAL | 2 refills | Status: DC

## 2019-06-18 MED ORDER — FUROSEMIDE 40 MG PO TABS: ORAL_TABLET | ORAL | 1 refills | Status: DC

## 2019-06-18 MED ORDER — BIDIL 20-37.5 MG PO TABS: 1.0000 | ORAL_TABLET | Freq: Three times a day (TID) | ORAL | 2 refills | Status: DC

## 2019-06-18 MED ORDER — SPIRONOLACTONE 25 MG PO TABS: 12.5000 mg | ORAL_TABLET | Freq: Every day | ORAL | 2 refills | Status: DC

## 2019-06-18 MED ORDER — METOPROLOL SUCCINATE ER 25 MG PO TB24: 12.5000 mg | ORAL_TABLET | Freq: Every day | ORAL | 2 refills | Status: DC

## 2019-07-10 ENCOUNTER — Other Ambulatory Visit: Payer: Self-pay

## 2019-07-10 DIAGNOSIS — I5022 Chronic systolic (congestive) heart failure: Secondary | ICD-10-CM

## 2019-07-10 MED ORDER — SPIRONOLACTONE 25 MG PO TABS: 12.5000 mg | ORAL_TABLET | Freq: Every day | ORAL | 2 refills | Status: DC

## 2019-07-10 MED ORDER — BIDIL 20-37.5 MG PO TABS: 1.0000 | ORAL_TABLET | Freq: Three times a day (TID) | ORAL | 2 refills | Status: DC

## 2019-07-13 IMAGING — CT CT ANGIOGRAPHY CHEST
2 of 9 series · 18 of 36 positions shown · IV contrast (omnipaque)
Comparison: CT angiogram chest August 10, 2009; chest radiographs
October 22, 2018 and October 25, 2018

CLINICAL DATA: Chest pain

EXAM:
CT ANGIOGRAPHY CHEST WITH CONTRAST
TECHNIQUE: Multidetector CT imaging of the chest was performed using the
standard protocol during bolus administration of intravenous
contrast. Multiplanar CT image reconstructions and MIPs were
obtained to evaluate the vascular anatomy.
CONTRAST:  75mL OMNIPAQUE IOHEXOL 350 MG/ML SOLN

[Series 7: thins · axial · 0.70mm/px · z∈[+1186,+1456]mm · 17 of 302 slices shown]
[im 16/302  lung]
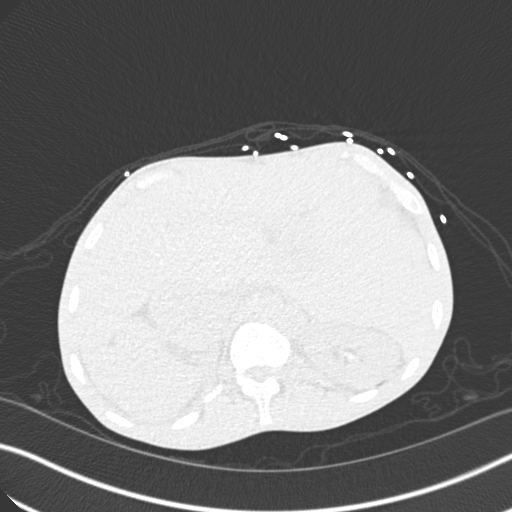
[im 32/302  mediastinal]
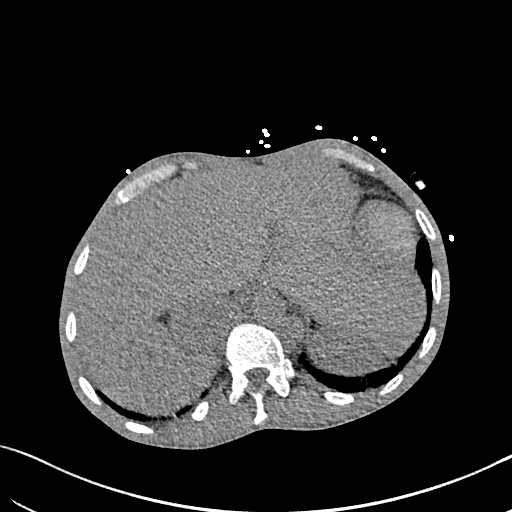
[im 48/302  lung]
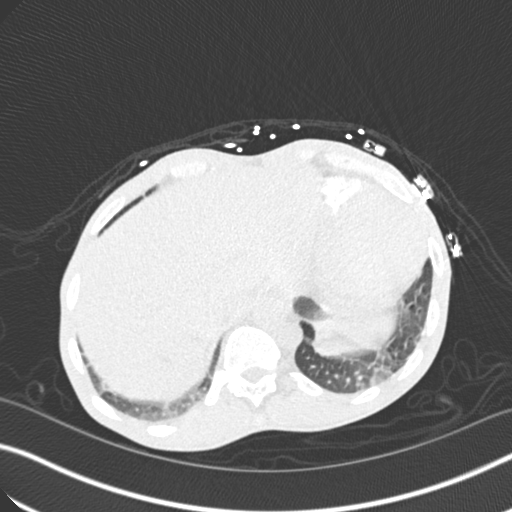
[im 64/302  mediastinal]
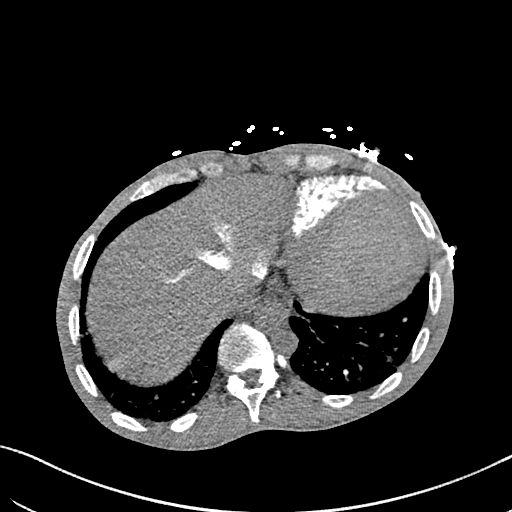
[im 80/302  lung]
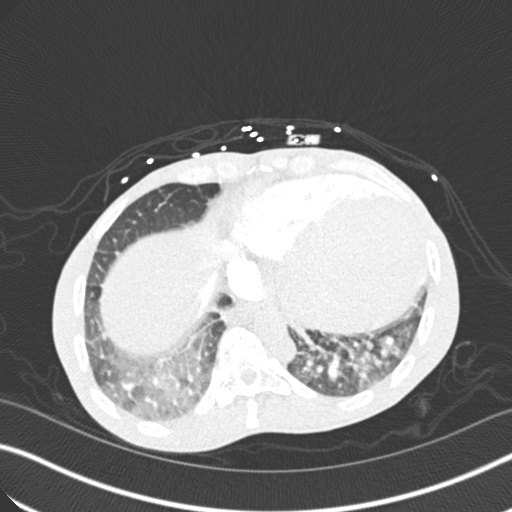
[im 96/302  mediastinal]
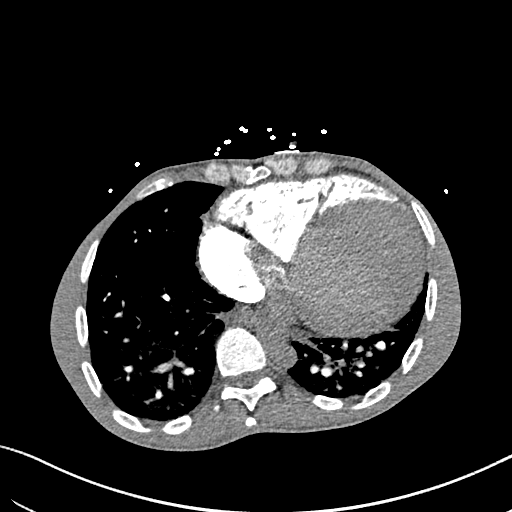
[im 111/302  lung]
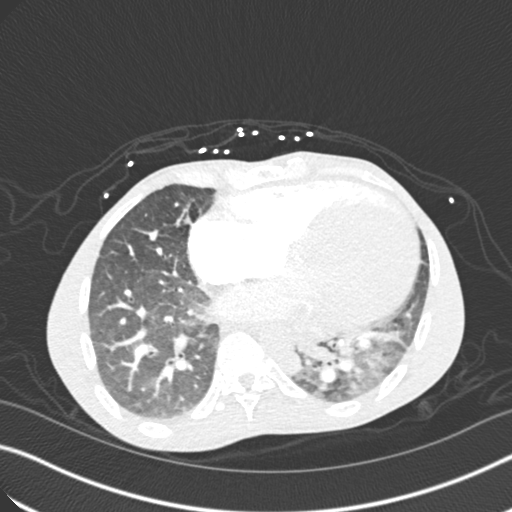
[im 127/302  mediastinal]
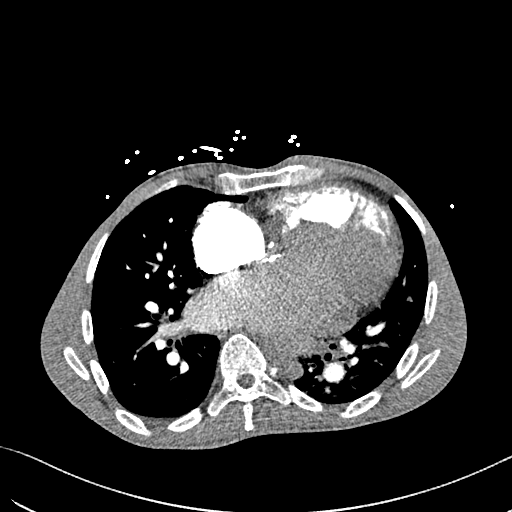
[im 159/302  lung]
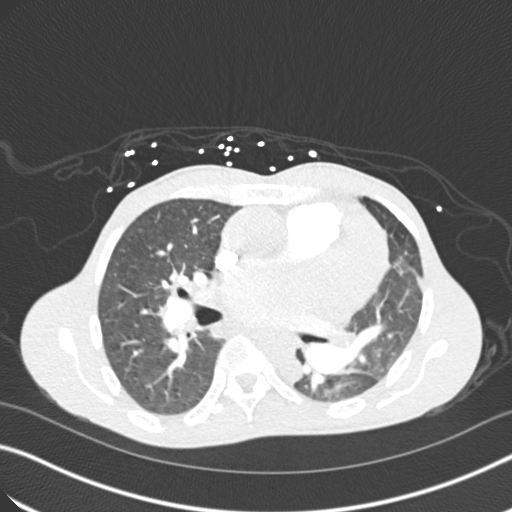
[im 175/302  mediastinal]
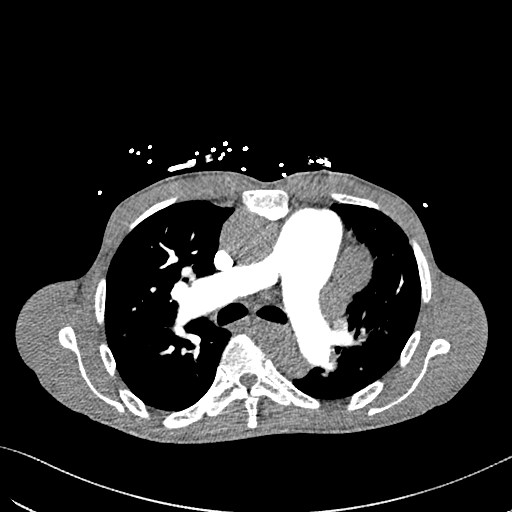
[im 191/302  lung]
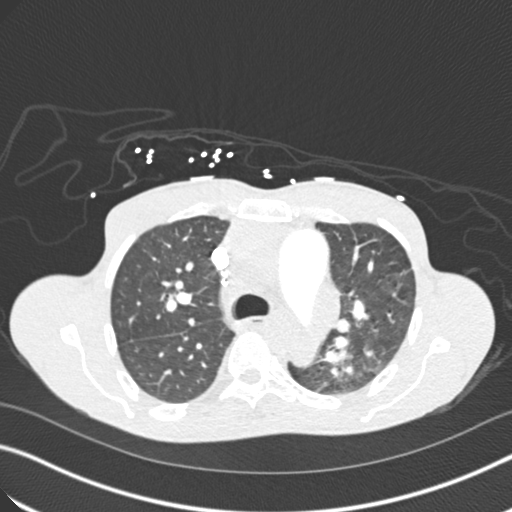
[im 206/302  mediastinal]
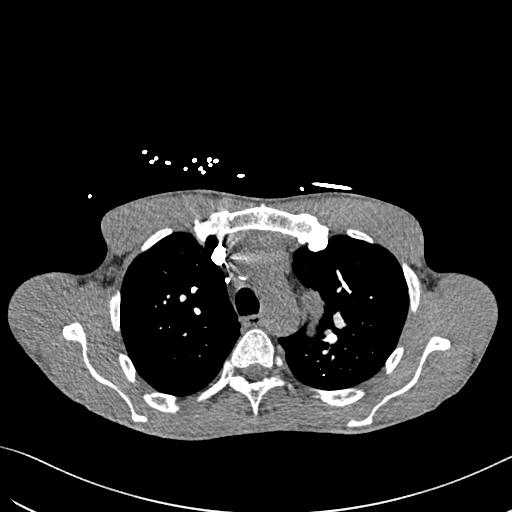
[im 222/302  lung]
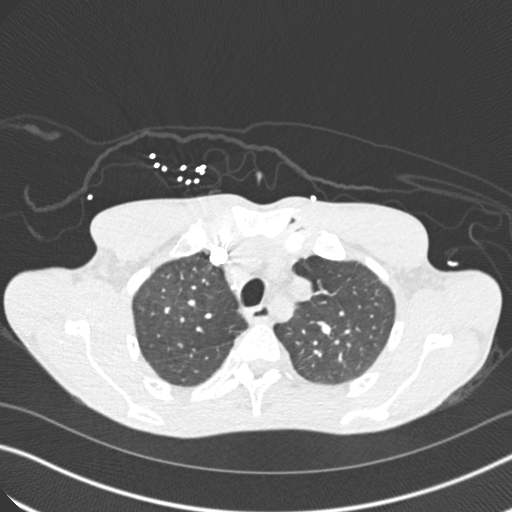
[im 238/302  mediastinal]
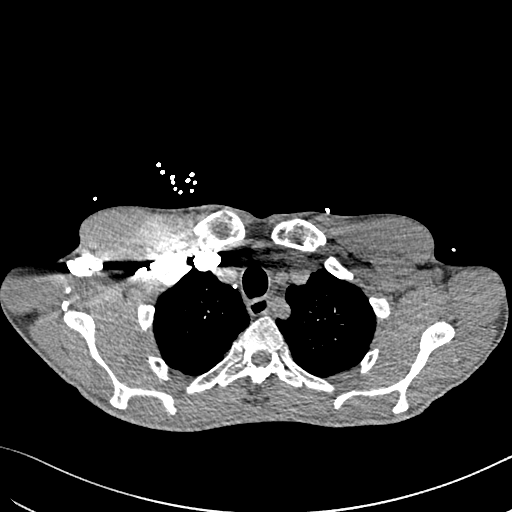
[im 254/302  lung]
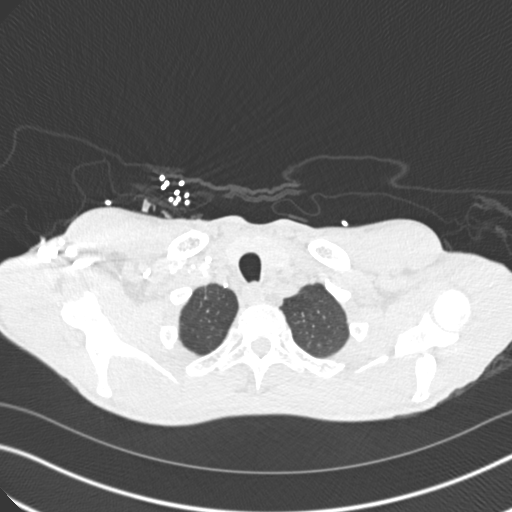
[im 270/302  mediastinal]
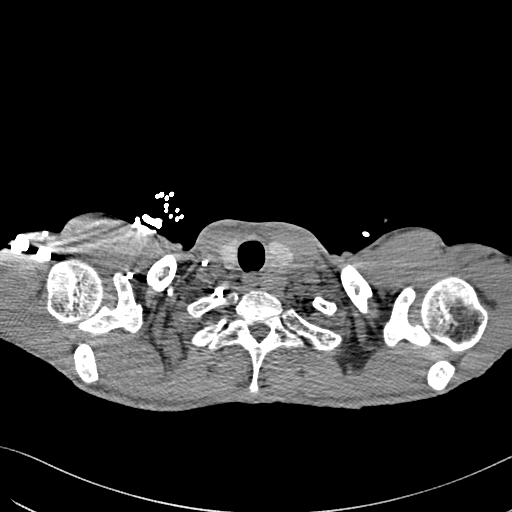
[im 286/302  lung]
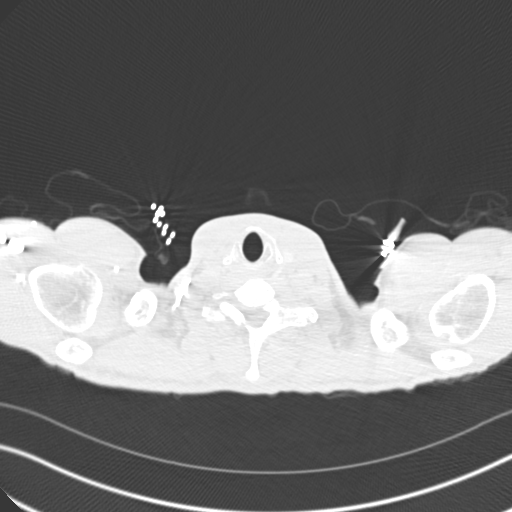

[Series 9: coronal mpr · coronal · 0.54mm/px · 1 of 115 slices shown]
[im 58/115  mediastinal]
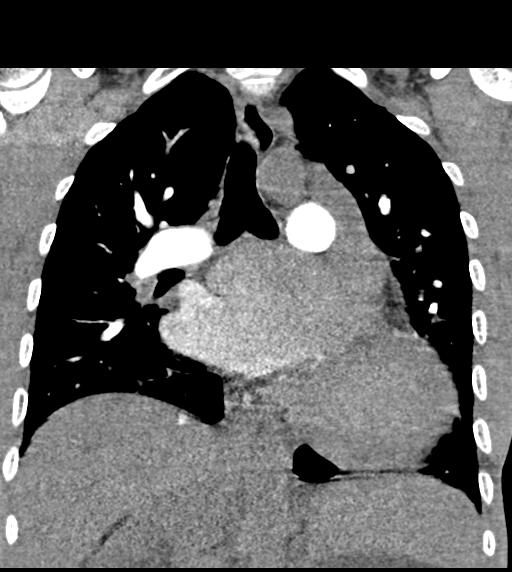

[18 of 36 positions shown; findings below may reference images not displayed]

FINDINGS: Cardiovascular: There is no demonstrable pulmonary embolus. There is
no thoracic aortic aneurysm. No dissection is seen in the aorta; the
contrast bolus within the aorta is not sufficient to assess for
potential dissection from a radiographic standpoint. The visualized
great vessels appear unremarkable. Note that the supreme intercostal
vein on the left and left hemiazygous system remain dilated, stable
from 8811. Left-sided aortic arch present.

There is cardiomegaly with primarily left-sided enlargement. There
is no pericardial effusion or pericardial thickening evident. Main
pulmonary outflow tract measures 4.0 cm, enlarged.

Mediastinum/Nodes: No appreciable thyroid lesion. There is no
appreciable thoracic adenopathy. No esophageal lesions are evident.

Lungs/Pleura: There is a calcified granuloma in the anterior segment
of the right lower lobe. There Is patchy infiltrate in the posterior
right base. There is also bibasilar and left mid lung atelectasis.
There is no appreciable interstitial edema. No appreciable pleural
effusion or pleural thickening.

Upper Abdomen: There is reflux of contrast into the inferior vena
cava and hepatic veins. There appears to be splenic tissue on the
right, stable from previous study. Liver is on the right side. Left
kidney is in normal flank position. Right kidney not seen in
visualized upper abdomen.

Musculoskeletal: There is thoracic dextroscoliosis. There are no
blastic or lytic bone lesions. No evident chest wall lesions.

Review of the MIP images confirms the above findings.
IMPRESSION: 1. No demonstrable pulmonary embolus. No thoracic aortic aneurysm.
Contrast bolus in the aorta is not sufficient to assess for
dissection. No dissection evident currently.

2. Enlargement of the main pulmonary outflow tract, consistent with
pulmonary arterial hypertension.

3. Cardiomegaly with predominantly left heart enlargement. Dilated
hemiazygous system and supreme intercostal vein on the left.
Appearance stable compared to prior study.

4. Patchy infiltrate posterior right base, apparently less than on
recent chest radiograph. Small granuloma right base. Areas of patchy
atelectasis. No new consolidation compared to recent chest
radiograph.

5. Reflux of contrast into the inferior vena cava and hepatic veins
may well represent a degree of increase in right heart pressure.

6.  Apparent heterotaxy syndrome in upper abdomen, stable.

7.   No demonstrable adenopathy.

## 2019-08-05 ENCOUNTER — Other Ambulatory Visit: Payer: Self-pay

## 2019-08-05 ENCOUNTER — Encounter (HOSPITAL_COMMUNITY): Payer: Self-pay | Admitting: Internal Medicine

## 2019-08-05 ENCOUNTER — Ambulatory Visit (HOSPITAL_COMMUNITY)
Admission: RE | Admit: 2019-08-05 | Discharge: 2019-08-05 | Disposition: A | Discharge status: Home / Self Care | Payer: Medicaid Other | Source: Ambulatory Visit | Attending: Internal Medicine | Admitting: Internal Medicine

## 2019-08-05 VITALS — BP 102/53 | HR 62 | Wt 123.6 lb

## 2019-08-05 DIAGNOSIS — Z79899 Other long term (current) drug therapy: Secondary | ICD-10-CM | POA: Diagnosis not present

## 2019-08-05 DIAGNOSIS — I4821 Permanent atrial fibrillation: Secondary | ICD-10-CM | POA: Diagnosis not present

## 2019-08-05 DIAGNOSIS — R011 Cardiac murmur, unspecified: Secondary | ICD-10-CM | POA: Diagnosis not present

## 2019-08-05 DIAGNOSIS — Z87891 Personal history of nicotine dependence: Secondary | ICD-10-CM | POA: Diagnosis not present

## 2019-08-05 DIAGNOSIS — I428 Other cardiomyopathies: Secondary | ICD-10-CM | POA: Diagnosis not present

## 2019-08-05 DIAGNOSIS — I340 Nonrheumatic mitral (valve) insufficiency: Secondary | ICD-10-CM | POA: Diagnosis not present

## 2019-08-05 DIAGNOSIS — I5022 Chronic systolic (congestive) heart failure: Secondary | ICD-10-CM | POA: Diagnosis present

## 2019-08-05 DIAGNOSIS — I110 Hypertensive heart disease with heart failure: Secondary | ICD-10-CM | POA: Diagnosis not present

## 2019-08-05 DIAGNOSIS — Z7901 Long term (current) use of anticoagulants: Secondary | ICD-10-CM | POA: Diagnosis not present

## 2019-08-05 LAB — CBC
HCT: 42.2 % (ref 39.0–52.0)
Hemoglobin: 13.8 g/dL (ref 13.0–17.0)
MCH: 31 pg (ref 26.0–34.0)
MCHC: 32.7 g/dL (ref 30.0–36.0)
MCV: 94.8 fL (ref 80.0–100.0)
Platelets: 165 10*3/uL (ref 150–400)
RBC: 4.45 MIL/uL (ref 4.22–5.81)
RDW: 11.9 % (ref 11.5–15.5)
WBC: 7.5 10*3/uL (ref 4.0–10.5)
nRBC: 0 % (ref 0.0–0.2)

## 2019-08-05 LAB — BASIC METABOLIC PANEL
Anion gap: 10 (ref 5–15)
BUN: 18 mg/dL (ref 6–20)
CO2: 22 mmol/L (ref 22–32)
Calcium: 9.5 mg/dL (ref 8.9–10.3)
Chloride: 103 mmol/L (ref 98–111)
Creatinine, Ser: 0.87 mg/dL (ref 0.61–1.24)
GFR calc Af Amer: >60 mL/min (ref >60)
GFR calc non Af Amer: >60 mL/min (ref >60)
Glucose, Bld: 89 mg/dL (ref 70–99)
Potassium: 4.9 mmol/L (ref 3.5–5.1)
Sodium: 135 mmol/L (ref 135–145)

## 2019-08-05 LAB — BRAIN NATRIURETIC PEPTIDE: B Natriuretic Peptide: 680.5 pg/mL — ABNORMAL HIGH (ref 0.0–100.0)

## 2019-08-05 MED ORDER — FUROSEMIDE 40 MG PO TABS: ORAL_TABLET | ORAL | 1 refills | Status: DC

## 2019-08-05 MED ORDER — SPIRONOLACTONE 25 MG PO TABS: 12.5000 mg | ORAL_TABLET | Freq: Every day | ORAL | 2 refills | Status: DC

## 2019-08-05 NOTE — Progress Notes (Signed)
ADVANCED HF CLINIC NOTE  PCP: [name], FNP Primary Cardiologist: Dr. 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   Echo here 05/06/19 confirmed EF 25-30% with severe MR and mild to moderate AI. RVEF moderately decreased. Personally reviewed  CPX test 9/20 with severe HF limitation.   Here for routine f/u. At last visit we restarted Entresto 24/26 (was off due to low BP). Tolerating well. Saw Dr. Mosetta Pigeon at [facility] in 12/20 and preparing for outpatient transplant eval in February. No dizziness, or swelling. Still working at [employer]. Just needs to take his time. Taking lasix 40 daily. No swelling. No bleeding on Apixaban, Had 4 teeth extracted last Monday.    Cardiac studies:  cMRI 2/19 1. Severely dilated left ventricular size, with normal thickness and severely decreased systolic function (LVEF =56%). There diffuse hypokinesis and paradoxical septal motion.There is no late gadolinium enhancement in the left ventricular myocardium. 2. Normal right ventricular size, thickness and mildly decreasedsystolic function (LVEF = 42%).  3.There are significant trabeculations in the left ventricular apex with non-compacted to compacted myocardium ratio: 2.6:1. This is consistent with non-compaction cardiomyopathy. 4. Moderately dilated left atrium and mildly dilated right  5. Mild to moderate aortic insufficiency. Moderate mitral regurgitation with posteriorly directed jet. Mild tricuspid regurgitation. No significant pulmonary regurgitation is seen.  -Echo 8/20 EF 25-30% with severe non-compaction. Severe MR with severe LAE  - TEE 8/20 EF 25-30% moderately reduced RV function severe MR, mod TR  R/L cath normal coronaries RA =  3 RV =  45/2 PA  = 45/12  (28) PCWP = 18 with large v-waves Fick = 4.0/2.6  CPX 9/20  FVC 2.28 (62%)    FEV1 1.79 (60%)     FEV1/FVC 79 (96%)     MVV 72(50%)   Resting HR: 73 Standing HR: 76 Peak HR: 110  (63% age predicted max HR)  BP rest: 102/60 Standing HR: 108/60 BP peak: 124/62  Peak VO2: 15.7 (41% predicted peak VO2)  VE/VCO2 slope: 55  OUES: 0.83  Peak RER: 1.11  Ventilatory Threshold: 13.6 (36% predicted or 87% measured peak VO2)  VE/MVV: 60%  O2pulse: 7  (64% predicted O2pulse)   Had echo at [facility] in 9/20 an EF read as 50-55%.    ROS: All systems negative except as listed in HPI, PMH and Problem List.  SH:  Social History   Socioeconomic History  . Marital status: Single    Spouse name: Not on file  . Number of children: 8  . Years of education: Not on file  . Highest education level: Not on file  Occupational History  . Not on file  Tobacco Use  . Smoking status: Former Smoker    Packs/day: 1.00    Years: 20.00    Pack years: 20.00    Types: Cigarettes    Quit date: 09/09/2018    Years since quitting: 0.9  . Smokeless tobacco: Never Used  Substance and Sexual Activity  . Alcohol use: Not Currently  . Drug use: Not Currently    Types: Marijuana  . Sexual activity: Not on file  Other Topics Concern  . Not on file  Social History Narrative  . Not on file   Social Determinants of Health   Financial Resource Strain:   .  Difficulty of Paying Living Expenses: Not on file  Food Insecurity:   . Worried About Charity fundraiser in the Last Year: Not on file  . Ran Out of Food in the Last Year: Not on file  Transportation Needs:   . Lack of Transportation (Medical): Not on file  . Lack of Transportation (Non-Medical): Not on file  Physical Activity:   . Days of Exercise per Week: Not on file  . Minutes of Exercise per Session: Not on file  Stress:   . Feeling of Stress : Not on file  Social Connections:   . Frequency of Communication with Friends and Family:  Not on file  . Frequency of Social Gatherings with Friends and Family: Not on file  . Attends Religious Services: Not on file  . Active Member of Clubs or Organizations: Not on file  . Attends Archivist Meetings: Not on file  . Marital Status: Not on file  Intimate Partner Violence:   . Fear of Current or Ex-Partner: Not on file  . Emotionally Abused: Not on file  . Physically Abused: Not on file  . Sexually Abused: Not on file    FH:  Family History  Problem Relation Age of Onset  . Sarcoidosis Mother   . Healthy Father     Past Medical History:  Diagnosis Date  . Atrial fibrillation ([place])   . CHF (congestive heart failure) ([place])   . Dysrhythmia    hx. of  A-fib  . Former smoker    Quit  . Hypertension   . Internal bleeding    DUE TO ASPIRIN, OR MEDS  . Murmur     Current Outpatient Medications  Medication Sig Dispense Refill  . apixaban (ELIQUIS) 5 MG TABS tablet Take 1 tablet (5 mg total) by mouth 2 (two) times daily. 180 tablet 1  . digoxin (LANOXIN) 0.125 MG tablet Take 1 tablet (0.125 mg total) by mouth daily. 90 tablet 2  . furosemide (LASIX) 40 MG tablet Take 1 pill every morning. Take additional 1 pill in the afternoon on days with excessive shortness of breath, cough. 180 tablet 1  . isosorbide-hydrALAZINE (BIDIL) 20-37.5 MG tablet Take 1 tablet by mouth 3 (three) times daily. 270 tablet 2  . metoprolol succinate (TOPROL XL) 25 MG 24 hr tablet Take 0.5 tablets (12.5 mg total) by mouth daily. 60 tablet 2  . sacubitril-valsartan (ENTRESTO) 24-26 MG Take 1 tablet by mouth 2 (two) times daily. 60 tablet 3  . spironolactone (ALDACTONE) 25 MG tablet Take 0.5 tablets (12.5 mg total) by mouth daily. 90 tablet 2   No current facility-administered medications for this encounter.    Vitals:   08/05/19 1021  BP: (!) 102/53  Pulse: 62  SpO2: 97%  Weight: 56.1 kg (123 lb 9.6 oz)    PHYSICAL EXAM:  General:  Thin Well appearing. No resp  difficulty HEENT: normal Neck: supple. no JVD. Carotids 2+ bilat; no bruits. No lymphadenopathy or thryomegaly appreciated. Cor: PMI nondisplaced. Regular rate & rhythm. 3/6 MR Lungs: clear Abdomen: soft, nontender, nondistended. No hepatosplenomegaly. No bruits or masses. Good bowel sounds. Extremities: no cyanosis, clubbing, rash, edema Neuro: alert & orientedx3, cranial nerves grossly intact. moves all 4 extremities w/o difficulty. Affect pleasant   ASSESSMENT & PLAN:  1. 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 No LGE - Cath 8/20 normal cors - Echo 8/20 EF 25-30% severe MR. Severe LAE - TEE 8/20 EF  25-30% severe MR. Moderately reduced RV function - Echo 05/06/19 EF 25-30% with severe MR and mild to moderate AI. RVEF moderately decreased.  - Stable chronic NYHA III despite severe HF limitation on CPX oin 9/20 - Volume status looks good.  - Continue Entresto 24/26 bid - Continue Bidil 1 tab tid - Continue Toprol XL 12.5 mg daily - Continue spiro 12.5 mg daily - Continue digoxin0.125 mg daily - BP remains to low to titrate meds further. At next visit will consider Farxiga if SBP > 100 - Has met with Dr. Mosetta Pigeon with [facility] team. Plan transplant eval in February - Blood type O-pos - He has been referred for ICD. Saw Dr. Lovena Le in 12/20 who was waiting on further titration of GDMT - Check labs today  2. Permanent AF - Toerating rate control well.  - Continue apixaban. No  bleeding - likely not candidate for RFA or DC-CV given severe LAE  3. Severe MR - TEE also suggests mild to moderate MS so may not be candidate for Clip if not transplant candidate. 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 Tobacco use, ongoing - has been quit since 3/20   Glori Bickers, MD  10:48 AM

## 2019-08-05 NOTE — Patient Instructions (Signed)
Labs done today, your results will be available in MyChart, we will contact you for abnormal readings.  Your physician recommends that you schedule a follow-up appointment in: 3 months  If you have any questions or concerns before your next appointment please send us a message through mychart or call our office at [phone].  At the [facility], you and your health needs are our priority. As part of our continuing mission to provide you with exceptional heart care, we have created designated Provider Care Teams. These Care Teams include your primary Cardiologist (physician) and Advanced Practice Providers (APPs- Physician Assistants and Nurse Practitioners) who all work together to provide you with the care you need, when you need it.   You may see any of the following providers on your designated Care Team at your next follow up: . Dr Daniel Bensimhon . Dr Dalton McLean . Amy Clegg, NP . Brittainy Simmons, PA . Lauren Kemp, PharmD   Please be sure to bring in all your medications bottles to every appointment.     

## 2019-08-16 ENCOUNTER — Ambulatory Visit: Payer: Medicaid Other | Admitting: Cardiology

## 2019-08-22 ENCOUNTER — Other Ambulatory Visit: Payer: Self-pay

## 2019-08-22 ENCOUNTER — Encounter: Payer: Self-pay | Admitting: Cardiology

## 2019-08-22 ENCOUNTER — Ambulatory Visit (INDEPENDENT_AMBULATORY_CARE_PROVIDER_SITE_OTHER): Payer: Medicaid Other | Admitting: Cardiology

## 2019-08-22 VITALS — BP 109/48 | HR 56 | Temp 98.2°F | Resp 16 | Ht 64.0 in | Wt 123.0 lb

## 2019-08-22 DIAGNOSIS — I5022 Chronic systolic (congestive) heart failure: Secondary | ICD-10-CM | POA: Diagnosis not present

## 2019-08-22 DIAGNOSIS — I4821 Permanent atrial fibrillation: Secondary | ICD-10-CM | POA: Diagnosis not present

## 2019-08-22 DIAGNOSIS — I340 Nonrheumatic mitral (valve) insufficiency: Secondary | ICD-10-CM | POA: Diagnosis not present

## 2019-08-22 DIAGNOSIS — I428 Other cardiomyopathies: Secondary | ICD-10-CM | POA: Diagnosis not present

## 2019-08-22 MED ORDER — METOPROLOL SUCCINATE ER 25 MG PO TB24: 25.0000 mg | ORAL_TABLET | Freq: Every day | ORAL | 2 refills | Status: DC

## 2019-08-22 NOTE — Progress Notes (Signed)
Patient referred by Diamantina Providence, FNP for chest pain, shortness of breath  Subjective:   Gary [name], male    DOB: 29-Mar-1976, 44 y.o.   MRN: [id number]   Chief Complaint  Patient presents with  . Congestive Heart Failure  . 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  Patient is seeing Dr. Gala Romney, who has been coordinating his trasnplant workup at [facility]. His EF has remained around 30-35% on most recent echocardiogram on 05/06/2019.   He is doing well without any overt dyspnea, orthopnea, PND. He was evaluated by Dr. Ladona Ridgel for ICD, felt that he needed up titration of medical therapy prior to ICD. He was recently seen by Dr. Gala Romney. Transplant workup at [facility] continues, with apt scheduled with them for later this month.   Current Outpatient Medications on File Prior to Visit  Medication Sig Dispense Refill  . apixaban (ELIQUIS) 5 MG TABS tablet Take 1 tablet (5 mg total) by mouth 2 (two) times daily. 180 tablet 1  . digoxin (LANOXIN) 0.125 MG tablet Take 1 tablet (0.125 mg total) by mouth daily. 90 tablet 2  . furosemide (LASIX) 40 MG tablet Take 1 pill every morning. Take additional 1 pill in the afternoon on days with excessive shortness of breath, cough. 180 tablet 1  . isosorbide-hydrALAZINE (BIDIL) 20-37.5 MG tablet Take 1 tablet by mouth 3 (three) times daily. 270 tablet 2  . metoprolol succinate (TOPROL XL) 25 MG 24 hr tablet Take 0.5 tablets (12.5 mg total) by mouth daily. 60 tablet 2  . sacubitril-valsartan (ENTRESTO) 24-26 MG Take 1 tablet by mouth 2 (two) times daily. 60 tablet 3  . spironolactone (ALDACTONE) 25 MG tablet Take 0.5 tablets (12.5 mg total) by mouth daily. 90 tablet 2   No current facility-administered medications on file  prior to visit.    Cardiovascular studies:  Echocardiogram 05/06/2019: Mild LVH, EF 30-35%. LV noncompaction Moderate RV systolic dysfunction. Severe LA dilatation. Myxomatous severe mitral regurgitation. Moderate tricuspid regurgitation. The aortic valve has an indeterminant number of cusps Aortic valve regurgitation is moderate to severe by color flow Doppler. Moderately elevated pulmonary artery systolic pressure. The atrial septum is grossly normal.  cMRI 08/2017: 1. Severely dilated left ventricular size, with normal thickness and severely decreased systolic function (LVEF =31%). There diffuse hypokinesis and paradoxical septal motion. 2. There is no late gadolinium enhancement in the left ventricular myocardium. 2. Normal right ventricular size, thickness and mildly decreased systolic function (LVEF = 42%). There are no regional wall motion Abnormalities. 3.There are significant trabeculations in the left ventricular apex with non-compacted to compacted myocardium ratio: 2.6:1. This is consistent with non-compaction cardiomyopathy. 4. Moderately dilated left atrium and mildly dilated right atrial size. 5. Mild to moderate aortic insufficiency. Moderate mitral regurgitation with posteriorly directed jet. Mild tricuspid regurgitation. No significant pulmonary regurgitation is seen.  EKG 01/31/2019: Atrial fibrillation with controlled ventricular response  Voltage criteria for LVH  T wave inversion abnormality  -Possible  Inferior and lateral  ischemia.     Recent labs: Results for ASCENCION, COYE (MRN [id number]) as of 02/14/2019 15:20  Ref. Range 01/12/2019 04:21 01/13/2019 05:12 02/07/2019 09:29  BASIC METABOLIC PANEL Unknown Rpt (A) Rpt (A) Rpt (A)  Sodium Latest Ref Range: 134 - 144 mmol/L  138 135 134  Potassium Latest Ref Range: 3.5 - 5.2 mmol/L 4.0 3.9 4.2  Chloride Latest Ref Range: 96 - 106 mmol/L 105 104 101  CO2 Latest Ref Range: 20 - 29 mmol/L 23 23 19  (L)    Glucose Latest Ref Range: 65 - 99 mg/dL 105 (H) 111 (H) 110 (H)  BUN Latest Ref Range: 6 - 24 mg/dL 15 14 22   Creatinine Latest Ref Range: 0.76 - 1.27 mg/dL 1.08 0.88 1.10  Calcium Latest Ref Range: 8.7 - 10.2 mg/dL 9.0 8.8 (L) 8.7  Anion gap Latest Ref Range: 5 - 15  10 8    BUN/Creatinine Ratio Latest Ref Range: 9 - 20    20  GFR, Est Non African American Latest Ref Range: >59 mL/min/1.73 >60 >60 81  GFR, Est African American Latest Ref Range: >59 mL/min/1.73 >60 >60 94     Review of Systems  Cardiovascular: Negative for chest pain, dyspnea on exertion, leg swelling, palpitations and syncope.  Skin: Negative for itching and rash.  All other systems reviewed and are negative.        Objective:      Vitals:   08/22/19 1437  BP: (!) 109/48  Pulse: (!) 56  Resp: 16  Temp: 98.2 F (36.8 C)  SpO2: 98%     Body mass index is 21.11 kg/m. Filed Weights   08/22/19 1437  Weight: 123 lb (55.8 kg)    Physical Exam  Constitutional: He appears well-developed and well-nourished.  Neck: No JVD present.  Cardiovascular: Normal rate, regular rhythm, normal heart sounds and intact distal pulses.  No murmur heard. Pulmonary/Chest: Effort normal and breath sounds normal. He has no wheezes. He has no rales.  Musculoskeletal:        General: No edema.  Nursing note and vitals reviewed.       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 NYHA II. Increase metoprolol succinate to 25 mg daily. Continue Bidil 20-37.5 mg tid, spironolactone 12.5 mg daily, digoxin 0.125 mg daily, losartan 25 mg daily.  Anticipating further imaging at [facility], I have not checked  echocardiogram today. Continue transplant workup with [facility].  Aortic and mitral regurgitation: Both appear moderate. Will conitnue to monitor. Given that EF remains low, he may better benefit from transplant than  mitraclip alone.   Permanent atrial fibrillation: Rate controlled. I would consider rhythm control after optimizing his heart failure therapy.  CHA2DSVasc score 1 with annual  Stroke risk 0.6%. However, given his compaction cardiomyopathy, his stroke risk is higher.  Continue anticoagulation with apixaban 5 mg bid.   Continue f/u w/Dr. Kendrick Ranch and [facility].   [name], MD [facility]. [place] Pager: [phone] Office: [phone] If no answer Cell [phone]

## 2019-08-23 ENCOUNTER — Encounter: Payer: Self-pay | Admitting: Cardiology

## 2019-09-02 ENCOUNTER — Other Ambulatory Visit: Payer: Self-pay

## 2019-09-02 DIAGNOSIS — I5022 Chronic systolic (congestive) heart failure: Secondary | ICD-10-CM

## 2019-09-02 DIAGNOSIS — I482 Chronic atrial fibrillation, unspecified: Secondary | ICD-10-CM

## 2019-09-02 MED ORDER — ENTRESTO 24-26 MG PO TABS: 1.0000 | ORAL_TABLET | Freq: Two times a day (BID) | ORAL | 3 refills | Status: DC

## 2019-09-02 MED ORDER — APIXABAN 5 MG PO TABS: 5.0000 mg | ORAL_TABLET | Freq: Two times a day (BID) | ORAL | 1 refills | Status: DC

## 2019-09-02 MED ORDER — METOPROLOL SUCCINATE ER 25 MG PO TB24: 25.0000 mg | ORAL_TABLET | Freq: Every day | ORAL | 2 refills | Status: DC

## 2019-09-04 ENCOUNTER — Other Ambulatory Visit: Payer: Self-pay

## 2019-09-04 DIAGNOSIS — I482 Chronic atrial fibrillation, unspecified: Secondary | ICD-10-CM

## 2019-09-04 DIAGNOSIS — I5022 Chronic systolic (congestive) heart failure: Secondary | ICD-10-CM

## 2019-09-04 MED ORDER — ENTRESTO 24-26 MG PO TABS: 1.0000 | ORAL_TABLET | Freq: Two times a day (BID) | ORAL | 3 refills | Status: DC

## 2019-09-04 MED ORDER — METOPROLOL SUCCINATE ER 25 MG PO TB24: 25.0000 mg | ORAL_TABLET | Freq: Every day | ORAL | 2 refills | Status: DC

## 2019-09-04 MED ORDER — DIGOXIN 125 MCG PO TABS: 0.1250 mg | ORAL_TABLET | Freq: Every day | ORAL | 2 refills | Status: DC

## 2019-09-04 MED ORDER — APIXABAN 5 MG PO TABS: 5.0000 mg | ORAL_TABLET | Freq: Two times a day (BID) | ORAL | 1 refills | Status: DC

## 2019-09-20 ENCOUNTER — Other Ambulatory Visit: Payer: Self-pay

## 2019-09-20 DIAGNOSIS — I5022 Chronic systolic (congestive) heart failure: Secondary | ICD-10-CM

## 2019-09-20 MED ORDER — BIDIL 20-37.5 MG PO TABS: 1.0000 | ORAL_TABLET | Freq: Three times a day (TID) | ORAL | 2 refills | Status: DC

## 2019-09-20 MED ORDER — DIGOXIN 125 MCG PO TABS: 0.1250 mg | ORAL_TABLET | Freq: Every day | ORAL | 2 refills | Status: DC

## 2019-09-30 ENCOUNTER — Ambulatory Visit: Payer: Medicaid Other | Admitting: Internal Medicine

## 2019-10-12 NOTE — Progress Notes (Deleted)
Cardiology Office Note Date:  10/12/2019  Patient ID:  Gary, [name] 18-Nov-1975, MRN [id number] PCP:  [name], FNP  Cardiologist:  Dr. Virgina Jock AHF: Dr. Haroldine Laws EP: Dr. Lovena Le  ***refresh   Chief Complaint: *** planned follow up, EF, ? ICD  History of Present Illness: Gary [name] is a 44 y.o. 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  He is followed primarily by Dr. Josefina Do and Dr. Haroldine Laws, as well as [facility] for heart transplant. He has seen dr. Lovena Le for evaluation of ICD, though recommended titration of GDMT 1st.  He last saw Dr. Patwardhan feb 2021, his metoprolol was increased, planned for continued f/u with AHF and transplant teams.  He last saw Dr. Haroldine Laws Jan 2021, BP unable to tolerate more Entresto   *** sees DB may 5th *** volume *** Transplant  Process ?? *** meds *** bleeding, eliquis   Past Medical History:  Diagnosis Date  . Acute on chronic combined systolic and diastolic CHF, NYHA class 3 ([place]) 01/11/2019  . Acute on chronic congestive heart failure ([place])   . Acute on chronic systolic and diastolic heart failure, NYHA class 1 ([place]) 02/17/2019  . Atrial fibrillation ([place])   . CHF (congestive heart failure) ([place])   . Chronic atrial fibrillation ([place]) 10/27/2018  . Chronic systolic heart failure ([place]) 05/16/2019  . Dysrhythmia    hx. of  A-fib  . Former smoker    Quit  . Hyperglycemia 01/11/2019  . Hypertension   . Incarcerated inguinal hernia 10/13/2017  . Internal bleeding    DUE TO ASPIRIN, OR MEDS  . Multifocal pneumonia 01/11/2019  . Murmur   . Noncompaction cardiomyopathy ([place]) 01/31/2019  . Protein-calorie malnutrition, severe 10/17/2017  . Severe mitral regurgitation 01/31/2019    Past Surgical History:  Procedure Laterality Date  . ABDOMINAL SURGERY    . ABDOMINAL SURGERY     under 10 yrs. old or younger  .  CARDIAC SURGERY    . CARDIOVERSION N/A 08/17/2017   Procedure: CARDIOVERSION;  Surgeon: Dixie Dials, MD;  Location[facility] ENDOSCOPY;  Service: Cardiovascular;  Laterality: N/A;  . HERNIA REPAIR    . INGUINAL HERNIA REPAIR Right 10/13/2017   Procedure: OPEN RIGHT INGUINAL HERNIA REPAIR ERAS PATHWAY;  Surgeon: Judeth Horn, MD;  Location: [place];  Service: General;  Laterality: Right;  . OPEN HEART SURGERY     AS A CHILD , at 2 yrs. old. done in [place]  . RIGHT/LEFT HEART CATH AND CORONARY ANGIOGRAPHY N/A 02/19/2019   Procedure: RIGHT/LEFT HEART CATH AND CORONARY ANGIOGRAPHY;  Surgeon: [name], MD;  Location: [place] CV LAB;  Service: Cardiovascular;  Laterality: N/A;  . TEE WITHOUT CARDIOVERSION N/A 08/17/2017   Procedure: TRANSESOPHAGEAL ECHOCARDIOGRAM (TEE);  Surgeon: Dixie Dials, MD;  Location: [facility] ENDOSCOPY;  Service: Cardiovascular;  Laterality: N/A;  . TEE WITHOUT CARDIOVERSION N/A 02/19/2019   Procedure: TRANSESOPHAGEAL ECHOCARDIOGRAM (TEE);  Surgeon: [name], MD;  Location: [facility] ENDOSCOPY;  Service: Cardiovascular;  Laterality: N/A;    Current Outpatient Medications  Medication Sig Dispense Refill  . apixaban (ELIQUIS) 5 MG TABS tablet Take 1 tablet (5 mg total) by mouth 2 (two) times daily. 180 tablet 1  . digoxin (LANOXIN) 0.125 MG tablet Take 1 tablet (0.125 mg total) by mouth daily. 90 tablet 2  . furosemide (LASIX) 40 MG tablet Take 1 pill every morning. Take additional 1 pill  in the afternoon on days with excessive shortness of breath, cough. 180 tablet 1  . isosorbide-hydrALAZINE (BIDIL) 20-37.5 MG tablet Take 1 tablet by mouth 3 (three) times daily. 270 tablet 2  . metoprolol succinate (TOPROL XL) 25 MG 24 hr tablet Take 1 tablet (25 mg total) by mouth daily. 60 tablet 2  . sacubitril-valsartan (ENTRESTO) 24-26 MG Take 1 tablet by mouth 2 (two) times daily. 60 tablet 3  . spironolactone (ALDACTONE) 25 MG tablet Take 0.5 tablets (12.5 mg total) by mouth  daily. 90 tablet 2   No current facility-administered medications for this visit.    Allergies:   Aspirin   Social History:  The patient  reports that he quit smoking about 13 months ago. His smoking use included cigarettes. He has a 20.00 pack-year smoking history. He has never used smokeless tobacco. He reports current alcohol use. He reports previous drug use. Drug: Marijuana.   Family History:  The patient's family history includes Healthy in his father; Sarcoidosis in his mother.  ROS:  Please see the history of present illness.  All other systems are reviewed and otherwise negative.   PHYSICAL EXAM: *** VS:  There were no vitals taken for this visit. BMI: There is no height or weight on file to calculate BMI. Well nourished, well developed, in no acute distress  HEENT: normocephalic, atraumatic  Neck: no JVD, carotid bruits or masses Cardiac:  *** RRR; no significant murmurs, no rubs, or gallops Lungs:  *** CTA b/l, no wheezing, rhonchi or rales  Abd: soft, nontender MS: no deformity or *** atrophy Ext: *** no edema  Skin: warm and dry, no rash Neuro:  No gross deficits appreciated Psych: euthymic mood, full affect   EKG:  Not done today   09/25/2019: CPX: Conclusions:  1) The data suggests a maximal exercise test, so maximal cardiorespiratory  capacity could be determined.   2) Based on the peak oxygen consumption (VO2) achieved, functional capacity  would be consistent with a moderate functional impairment, with peak VO2  19.7 ml/kg/min (45% of predicted normals). Normative peak predicted VO2  values are corrected for age, gender, and weight.   3) There were maximal signs of a circulatory limitation to exercise  capacity as evidenced by blunted heart rate augmentation and  ventilation-perfusion mismatch with exercise.  - The O2 pulse (an indicator of stroke volume) increased throughout  exercise.  - Peak heart rate was 102 bpm (58% age-predicted  maximum)  - ECG notable for baseline rhythm of Atrial Fibrillation. There was  ventricular ectopy (PVCs, couplets, triplets) during exercise. Heart rate  recovery was abnormal.   4) Aerobic reserve was low suggesting physical deconditioning may be a  potential limitation to exercise capacity.   5) Ventilatory reserve limits were not approached at peak exercise. Resting  spirometry suggests a restrictive pulmonary physiology. The MVV is normal.  Exercise oscillatory ventilation criteria was not met.   6) Prior CPET completed on 04/08/2019 with a peak VO2 of 15.7 ml/kg/min.   This is the metabolic report for the Cardiopulmonary Exercise Test. The  Stress ECG report is available on a separate report.   09/05/2019: RHC #1. Normal filling pressures with a cardiac index of 3.8 #2. A shunt run was performed with no evidence of an intracardiac shunt  Recommendations:  #1. Discussed with Dr. Mosetta Pigeon   09/03/2019: TTE SEVERE LV DYSFUNCTION (See above) WITH MILD LVH MILD RV SYSTOLIC DYSFUNCTION (See above) VALVULAR REGURGITATION: MILD AR, SEVERE MR, MILD TR NO VALVULAR  STENOSIS STABLE ASD PATCH NOTED WITHOUT RESIDUAL SHUNT DILATED CORONARY SINUS, SUGGESTIVE OF PERSISTENT LEFT SVC, UNABLE TO GAIN IV ACCESS TO PERFORM SALINE MICROCAVITATION STUDY HEAVILY TRABECULATED LV AND RV APEX SUGGEST NONCOMPACTION; UNABLE TO IMAGE WITH DEFINITY IMAGING AGENT PATIENT BRADYCARDIC THROUGHOUT EXAM NO PRIOR STUDY FOR COMPARISON  LVEF 30%   Echocardiogram 05/06/2019: Mild LVH, EF 30-35%. LV noncompaction Moderate RV systolic dysfunction. Severe LA dilatation. Myxomatous severe mitral regurgitation. Moderate tricuspid regurgitation. The aortic valve has an indeterminant number of cusps Aortic valve regurgitation is moderate to severe by color flow Doppler. Moderately elevated pulmonary artery systolic pressure. The atrial septum is grossly normal.  cMRI 08/2017: 1. Severely dilated  left ventricular size, with normal thickness and severely decreased systolic function (LVEF =65%). There diffuse hypokinesis and paradoxical septal motion. 2. There is no late gadolinium enhancement in the left ventricular myocardium. 2. Normal right ventricular size, thickness and mildly decreased systolic function (LVEF = 42%). There are no regional wall motion Abnormalities. 3.There are significant trabeculations in the left ventricular apex with non-compacted to compacted myocardium ratio: 2.6:1. This is consistent with non-compaction cardiomyopathy. 4. Moderately dilated left atrium and mildly dilated right atrial size. 5. Mild to moderate aortic insufficiency. Moderate mitral regurgitation with posteriorly directed jet. Mild tricuspid regurgitation. No significant pulmonary regurgitation is seen.    Recent Labs: 10/22/2018: Magnesium 1.9 10/27/2018: TSH 0.584 02/18/2019: ALT 17 08/05/2019: B Natriuretic Peptide 680.5; BUN 18; Creatinine, Ser 0.87; Hemoglobin 13.8; Platelets 165; Potassium 4.9; Sodium 135  No results found for requested labs within last 8760 hours.   CrCl cannot be calculated (Patient's most recent lab result is older than the maximum 21 days allowed.).   Wt Readings from Last 3 Encounters:  08/22/19 123 lb (55.8 kg)  08/05/19 123 lb 9.6 oz (56.1 kg)  06/13/19 123 lb 6.4 oz (56 kg)     Other studies reviewed: Additional studies/records reviewed today include: summarized above  ASSESSMENT AND PLAN:  1. NICM     ***   2. Afib (permanent)     CHA2DS2Vasc is one, on Eliquis     ***  Disposition: F/u with ***  Current medicines are reviewed at length with the patient today.  The patient did not have any concerns regarding medicines.***  Signed, Gary Standard, PA-C 10/12/2019 9:26 AM     [facility] [address] [place] [address] [phone] (office)  [phone] (fax)

## 2019-10-14 ENCOUNTER — Ambulatory Visit: Payer: Medicaid Other | Admitting: Physician Assistant

## 2019-10-25 ENCOUNTER — Encounter (HOSPITAL_COMMUNITY): Payer: Self-pay

## 2019-10-25 ENCOUNTER — Ambulatory Visit (HOSPITAL_COMMUNITY)
Admission: EM | Admit: 2019-10-25 | Discharge: 2019-10-25 | Disposition: A | Discharge status: Home / Self Care | Payer: Medicaid Other | Attending: Internal Medicine | Admitting: Internal Medicine

## 2019-10-25 DIAGNOSIS — Z20822 Contact with and (suspected) exposure to covid-19: Secondary | ICD-10-CM

## 2019-10-25 NOTE — ED Triage Notes (Signed)
Pt presents to UC for COVID test after exposure. Pt denies any signs and symptoms.

## 2019-10-26 LAB — SARS CORONAVIRUS 2 (TAT 6-24 HRS): SARS Coronavirus 2: NEGATIVE

## 2019-10-26 NOTE — ED Provider Notes (Signed)
[place]    CSN: [id number] Arrival date & time: 10/25/19  1939      History   Chief Complaint Chief Complaint  Patient presents with  . Covid Exposure    HPI MAXIMILLIAN HABIBI is a 44 y.o. male comes to urgent care for COVID-19 testing following close exposure to COVID-19 positive individual.  Patient has no symptoms.Marland Kitchen   HPI  Past Medical History:  Diagnosis Date  . Acute on chronic combined systolic and diastolic CHF, NYHA class 3 ([place]) 01/11/2019  . Acute on chronic congestive heart failure ([place])   . Acute on chronic systolic and diastolic heart failure, NYHA class 1 ([place]) 02/17/2019  . Atrial fibrillation ([place])   . CHF (congestive heart failure) ([place])   . Chronic atrial fibrillation ([place]) 10/27/2018  . Chronic systolic heart failure ([place]) 05/16/2019  . Dysrhythmia    hx. of  A-fib  . Former smoker    Quit  . Hyperglycemia 01/11/2019  . Hypertension   . Incarcerated inguinal hernia 10/13/2017  . Internal bleeding    DUE TO ASPIRIN, OR MEDS  . Multifocal pneumonia 01/11/2019  . Murmur   . Noncompaction cardiomyopathy ([place]) 01/31/2019  . Protein-calorie malnutrition, severe 10/17/2017  . Severe mitral regurgitation 01/31/2019    Patient Active Problem List   Diagnosis Date Noted  . Chronic systolic heart failure ([place]) 05/16/2019  . Acute on chronic congestive heart failure ([place])   . Acute on chronic systolic and diastolic heart failure, NYHA class 1 ([place]) 02/17/2019  . Atrial fibrillation ([place]) 01/31/2019  . Noncompaction cardiomyopathy ([place]) 01/31/2019  . Severe mitral regurgitation 01/31/2019  . Acute on chronic combined systolic and diastolic CHF, NYHA class 3 ([place]) 01/11/2019  . Multifocal pneumonia 01/11/2019  . Hyperglycemia 01/11/2019  . Former smoker   . Chronic atrial fibrillation ([place]) 10/27/2018  . Protein-calorie malnutrition, severe 10/17/2017  . Incarcerated inguinal hernia 10/13/2017    Past Surgical History:  Procedure Laterality Date  .  ABDOMINAL SURGERY    . ABDOMINAL SURGERY     under 10 yrs. old or younger  . CARDIAC SURGERY    . CARDIOVERSION N/A 08/17/2017   Procedure: CARDIOVERSION;  Surgeon: Dixie Dials, MD;  Location: [facility] ENDOSCOPY;  Service: Cardiovascular;  Laterality: N/A;  . HERNIA REPAIR    . INGUINAL HERNIA REPAIR Right 10/13/2017   Procedure: OPEN RIGHT INGUINAL HERNIA REPAIR ERAS PATHWAY;  Surgeon: Judeth Horn, MD;  Location: [place];  Service: General;  Laterality: Right;  . OPEN HEART SURGERY     AS A CHILD , at 2 yrs. old. done in [place]  . RIGHT/LEFT HEART CATH AND CORONARY ANGIOGRAPHY N/A 02/19/2019   Procedure: RIGHT/LEFT HEART CATH AND CORONARY ANGIOGRAPHY;  Surgeon: [name], MD;  Location: [place] CV LAB;  Service: Cardiovascular;  Laterality: N/A;  . TEE WITHOUT CARDIOVERSION N/A 08/17/2017   Procedure: TRANSESOPHAGEAL ECHOCARDIOGRAM (TEE);  Surgeon: Dixie Dials, MD;  Location: [facility] ENDOSCOPY;  Service: Cardiovascular;  Laterality: N/A;  . TEE WITHOUT CARDIOVERSION N/A 02/19/2019   Procedure: TRANSESOPHAGEAL ECHOCARDIOGRAM (TEE);  Surgeon: [name], MD;  Location: [facility] ENDOSCOPY;  Service: Cardiovascular;  Laterality: N/A;       Home Medications    Prior to Admission medications   Medication Sig Start Date End Date Taking? Authorizing Provider  apixaban (ELIQUIS) 5 MG TABS tablet Take 1 tablet (5 mg total) by mouth 2 (two) times daily. 09/04/19   Adrian Prows, MD  digoxin (LANOXIN) 0.125 MG tablet Take 1 tablet (  0.125 mg total) by mouth daily. 09/20/19   Yates Decamp, MD  furosemide (LASIX) 40 MG tablet Take 1 pill every morning. Take additional 1 pill in the afternoon on days with excessive shortness of breath, cough. 08/05/19   Yates Decamp, MD  isosorbide-hydrALAZINE (BIDIL) 20-37.5 MG tablet Take 1 tablet by mouth 3 (three) times daily. 09/20/19   Yates Decamp, MD  metoprolol succinate (TOPROL XL) 25 MG 24 hr tablet Take 1 tablet (25 mg total) by mouth daily. 09/04/19 09/03/20   Yates Decamp, MD  sacubitril-valsartan (ENTRESTO) 24-26 MG Take 1 tablet by mouth 2 (two) times daily. 09/04/19   Yates Decamp, MD  spironolactone (ALDACTONE) 25 MG tablet Take 0.5 tablets (12.5 mg total) by mouth daily. 08/05/19   Yates Decamp, MD    Family History Family History  Problem Relation Age of Onset  . Sarcoidosis Mother   . Healthy Father     Social History Social History   Tobacco Use  . Smoking status: Former Smoker    Packs/day: 1.00    Years: 20.00    Pack years: 20.00    Types: Cigarettes    Quit date: 09/09/2018    Years since quitting: 1.1  . Smokeless tobacco: Never Used  Substance Use Topics  . Alcohol use: Yes    Comment: occ  . Drug use: Not Currently    Types: Marijuana     Allergies   Aspirin   Review of Systems Review of Systems  Constitutional: Negative.   HENT: Negative.   Respiratory: Negative.   Cardiovascular: Negative.   Gastrointestinal: Negative.      Physical Exam Triage Vital Signs ED Triage Vitals  Enc Vitals Group     BP 10/25/19 2015 (!) 98/54     Pulse Rate 10/25/19 2015 60     Resp 10/25/19 2015 15     Temp 10/25/19 2015 98.9 F (37.2 C)     Temp Source 10/25/19 2015 Oral     SpO2 10/25/19 2015 98 %     Weight --      Height --      Head Circumference --      Peak Flow --      Pain Score 10/25/19 2003 0     Pain Loc --      Pain Edu? --      Excl. in GC? --    No data found.  Updated Vital Signs BP (!) 98/54 (BP Location: Right Arm)   Pulse 60   Temp 98.9 F (37.2 C) (Oral)   Resp 15   SpO2 98%   Visual Acuity Right Eye Distance:   Left Eye Distance:   Bilateral Distance:    Right Eye Near:   Left Eye Near:    Bilateral Near:     Physical Exam Vitals and nursing note reviewed.  Constitutional:      Appearance: Normal appearance.  Cardiovascular:     Rate and Rhythm: Normal rate and regular rhythm.     Pulses: Normal pulses.     Heart sounds: Normal heart sounds.  Pulmonary:     Effort:  Pulmonary effort is normal.     Breath sounds: Normal breath sounds.  Neurological:     Mental Status: He is alert.      UC Treatments / Results  Labs (all labs ordered are listed, but only abnormal results are displayed) Labs Reviewed  SARS CORONAVIRUS 2 (TAT 6-24 HRS)    EKG   Radiology No results found.  Procedures Procedures (including critical care time)  Medications Ordered in UC Medications - No data to display  Initial Impression / Assessment and Plan / UC Course  I have reviewed the triage vital signs and the nursing notes.  Pertinent labs & imaging results that were available during my care of the patient were reviewed by me and considered in my medical decision making (see chart for details).     1.  Exposure to COVID-19 virus: COVID-19 PCR has been sent Patient is advised to quarantine until COVID-19 results are resulted Tylenol/Motrin for pain and or fever Patient is advised to hydrate very well. If the results are significant patient will be called. Final Clinical Impressions(s) / UC Diagnoses   Final diagnoses:  Close exposure to COVID-19 virus   Discharge Instructions   None    ED Prescriptions    None     PDMP not reviewed this encounter.   Merrilee Jansky, MD 10/26/19 2300

## 2019-10-29 ENCOUNTER — Encounter (HOSPITAL_COMMUNITY): Payer: Self-pay

## 2019-10-29 NOTE — Telephone Encounter (Signed)
Can you look into this please

## 2019-10-29 NOTE — Telephone Encounter (Signed)
Please read

## 2019-10-29 NOTE — Telephone Encounter (Signed)
I think this may be a question for on e of you.

## 2019-10-30 ENCOUNTER — Telehealth: Payer: Self-pay

## 2019-10-30 NOTE — Telephone Encounter (Signed)
Patient spoke to [facility] who answered his questions about which networks were were in

## 2019-10-30 NOTE — Telephone Encounter (Signed)
Can you discuss with AD and see what plans we participate and let patient know on My Chart

## 2019-11-05 ENCOUNTER — Other Ambulatory Visit: Payer: Self-pay

## 2019-11-05 DIAGNOSIS — I5022 Chronic systolic (congestive) heart failure: Secondary | ICD-10-CM

## 2019-11-05 MED ORDER — ENTRESTO 24-26 MG PO TABS: 1.0000 | ORAL_TABLET | Freq: Two times a day (BID) | ORAL | 3 refills | Status: DC

## 2019-11-05 MED ORDER — SPIRONOLACTONE 25 MG PO TABS: 12.5000 mg | ORAL_TABLET | Freq: Every day | ORAL | 2 refills | Status: DC

## 2019-11-11 ENCOUNTER — Ambulatory Visit (HOSPITAL_COMMUNITY)
Admission: RE | Admit: 2019-11-11 | Discharge: 2019-11-11 | Disposition: A | Discharge status: Home / Self Care | Payer: Medicaid Other | Source: Ambulatory Visit | Attending: Internal Medicine | Admitting: Internal Medicine

## 2019-11-11 ENCOUNTER — Encounter (HOSPITAL_COMMUNITY): Payer: Self-pay

## 2019-11-11 ENCOUNTER — Encounter (HOSPITAL_COMMUNITY): Payer: Self-pay | Admitting: Internal Medicine

## 2019-11-11 ENCOUNTER — Other Ambulatory Visit: Payer: Self-pay

## 2019-11-11 VITALS — BP 92/54 | HR 53 | Wt 123.6 lb

## 2019-11-11 DIAGNOSIS — I5022 Chronic systolic (congestive) heart failure: Secondary | ICD-10-CM | POA: Insufficient documentation

## 2019-11-11 DIAGNOSIS — I4821 Permanent atrial fibrillation: Secondary | ICD-10-CM | POA: Diagnosis not present

## 2019-11-11 DIAGNOSIS — Z7901 Long term (current) use of anticoagulants: Secondary | ICD-10-CM | POA: Diagnosis not present

## 2019-11-11 DIAGNOSIS — I340 Nonrheumatic mitral (valve) insufficiency: Secondary | ICD-10-CM

## 2019-11-11 DIAGNOSIS — I428 Other cardiomyopathies: Secondary | ICD-10-CM | POA: Diagnosis not present

## 2019-11-11 DIAGNOSIS — Z8774 Personal history of (corrected) congenital malformations of heart and circulatory system: Secondary | ICD-10-CM | POA: Insufficient documentation

## 2019-11-11 DIAGNOSIS — I110 Hypertensive heart disease with heart failure: Secondary | ICD-10-CM | POA: Diagnosis not present

## 2019-11-11 DIAGNOSIS — Z79899 Other long term (current) drug therapy: Secondary | ICD-10-CM | POA: Diagnosis not present

## 2019-11-11 DIAGNOSIS — Z87891 Personal history of nicotine dependence: Secondary | ICD-10-CM | POA: Diagnosis not present

## 2019-11-11 LAB — CBC
HCT: 38.2 % — ABNORMAL LOW (ref 39.0–52.0)
Hemoglobin: 12.8 g/dL — ABNORMAL LOW (ref 13.0–17.0)
MCH: 31.1 pg (ref 26.0–34.0)
MCHC: 33.5 g/dL (ref 30.0–36.0)
MCV: 92.9 fL (ref 80.0–100.0)
Platelets: 99 10*3/uL — ABNORMAL LOW (ref 150–400)
RBC: 4.11 MIL/uL — ABNORMAL LOW (ref 4.22–5.81)
RDW: 12.2 % (ref 11.5–15.5)
WBC: 4.8 10*3/uL (ref 4.0–10.5)
nRBC: 0 % (ref 0.0–0.2)

## 2019-11-11 LAB — BRAIN NATRIURETIC PEPTIDE: B Natriuretic Peptide: 577.7 pg/mL — ABNORMAL HIGH (ref 0.0–100.0)

## 2019-11-11 NOTE — Progress Notes (Addendum)
ADVANCED HF CLINIC NOTE  PCP: [name], FNP Primary Cardiologist: Dr. 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   Echo here 05/06/19 confirmed EF 25-30% with severe MR and mild to moderate AI. RVEF moderately decreased. Personally reviewed  CPX test 9/20 with severe HF limitation. Repeat CPX test at [facility] was improved  Here for routine f/u. Has been following with Dr. Mosetta Pigeon and being worked up for transplant. (Not listed yet). Awaiting CT scan of c/a/p to understand orientation of organs. Feels ok. Still working at [employer]. Has good days and bad days. But can get around and do his thing as long as he has breaks. No swelling, orthopnea or edema. No problems with meds. No bleeding on Eliquis. BP low but no dizziness.  Cardiac studies:  cMRI 2/19 1. Severely dilated left ventricular size, with normal thickness and severely decreased systolic function (LVEF =70%). There diffuse hypokinesis and paradoxical septal motion.There is no late gadolinium enhancement in the left ventricular myocardium. 2. Normal right ventricular size, thickness and mildly decreasedsystolic function (LVEF = 42%).  3.There are significant trabeculations in the left ventricular apex with non-compacted to compacted myocardium ratio: 2.6:1. This is consistent with non-compaction cardiomyopathy. 4. Moderately dilated left atrium and mildly dilated right  5. Mild to moderate aortic insufficiency. Moderate mitral regurgitation with posteriorly directed jet. Mild tricuspid regurgitation. No significant pulmonary regurgitation is seen.  -Echo 8/20 EF 25-30% with severe non-compaction. Severe MR with severe LAE  - TEE 8/20 EF 25-30% moderately reduced RV function severe MR, mod  TR  R/L cath normal coronaries RA =  3 RV =  45/2 PA  = 45/12 (28) PCWP = 18 with large v-waves Fick = 4.0/2.6  Repeat CPX at [facility] 3/21 pVO2 19.7 (45%) VeVCO2 31 RER 1.36  CPX 9/20  FVC 2.28 (62%)    FEV1 1.79 (60%)     FEV1/FVC 79 (96%)     MVV 72(50%)   Resting HR: 73 Standing HR: 76 Peak HR: 110  (63% age predicted max HR)  BP rest: 102/60 Standing HR: 108/60 BP peak: 124/62  Peak VO2: 15.7 (41% predicted peak VO2)  VE/VCO2 slope: 55  OUES: 0.83  Peak RER: 1.11  Ventilatory Threshold: 13.6 (36% predicted or 87% measured peak VO2)  VE/MVV: 60%  O2pulse: 7  (64% predicted O2pulse)   Had echo at [facility] in 9/20 an EF read as 50-55%.    ROS: All systems negative except as listed in HPI, PMH and Problem List.  SH:  Social History   Socioeconomic History  . Marital status: Single    Spouse name: Not on file  . Number of children: 8  . Years of education: Not on file  . Highest education level: Not on file  Occupational History  . Not on file  Tobacco Use  . Smoking status: Former Smoker    Packs/day: 1.00    Years: 20.00    Pack years: 20.00    Types: Cigarettes    Quit date: 09/09/2018    Years since quitting: 1.1  . Smokeless tobacco: Never Used  Substance and Sexual Activity  . Alcohol use: Yes    Comment: occ  . Drug use: Not Currently    Types: Marijuana  . Sexual activity: Not on file  Other Topics Concern  . Not on file  Social History Narrative  . Not on file   Social Determinants of Health   Financial Resource Strain:   . Difficulty of Paying Living Expenses:   Food Insecurity:   . Worried About Charity fundraiser in the Last Year:   . Arboriculturist in the Last Year:   Transportation Needs:   . Film/video editor (Medical):   Marland Kitchen Lack of Transportation (Non-Medical):   Physical Activity:   . Days of Exercise per Week:   . Minutes of Exercise per Session:   Stress:   . Feeling of Stress :   Social Connections:   .  Frequency of Communication with Friends and Family:   . Frequency of Social Gatherings with Friends and Family:   . Attends Religious Services:   . Active Member of Clubs or Organizations:   . Attends Archivist Meetings:   Marland Kitchen Marital Status:   Intimate Partner Violence:   . Fear of Current or Ex-Partner:   . Emotionally Abused:   Marland Kitchen Physically Abused:   . Sexually Abused:     FH:  Family History  Problem Relation Age of Onset  . Sarcoidosis Mother   . Healthy Father     Past Medical History:  Diagnosis Date  . Acute on chronic combined systolic and diastolic CHF, NYHA class 3 ([place]) 01/11/2019  . Acute on chronic congestive heart failure ([place])   . Acute on chronic systolic and diastolic heart failure, NYHA class 1 ([place]) 02/17/2019  . Atrial fibrillation ([place])   . CHF (congestive heart failure) ([place])   . Chronic atrial fibrillation ([place]) 10/27/2018  . Chronic systolic heart failure ([place]) 05/16/2019  . Dysrhythmia    hx. of  A-fib  . Former smoker    Quit  . Hyperglycemia 01/11/2019  . Hypertension   . Incarcerated inguinal hernia 10/13/2017  . Internal bleeding    DUE TO ASPIRIN, OR MEDS  . Multifocal pneumonia 01/11/2019  . Murmur   . 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 Protein-calorie malnutrition, severe 10/17/2017  . Severe mitral regurgitation 01/31/2019    Current Outpatient Medications  Medication Sig Dispense Refill  . apixaban (ELIQUIS) 5 MG TABS tablet Take 1 tablet (5 mg total) by mouth 2 (two) times daily. 180 tablet 1  . digoxin (LANOXIN) 0.125 MG tablet Take 1 tablet (0.125 mg total) by mouth daily. 90 tablet 2  . furosemide (LASIX) 40 MG tablet Take 1 pill every morning. Take additional 1 pill in the afternoon on days with excessive shortness of breath, cough. 180 tablet 1  . isosorbide-hydrALAZINE (BIDIL) 20-37.5 MG tablet Take 1 tablet by mouth 3 (three) times daily. 270 tablet 2  . metoprolol succinate (TOPROL XL) 25 MG 24 hr tablet Take 1  tablet (25 mg total) by mouth daily. 60 tablet 2  . sacubitril-valsartan (ENTRESTO) 24-26 MG Take 1 tablet by mouth 2 (two) times daily. 60 tablet 3  . spironolactone (ALDACTONE) 25 MG tablet Take 0.5 tablets (12.5 mg total) by mouth daily. 90 tablet 2   No current facility-administered medications for this encounter.    Vitals:   11/11/19 1010  BP: (!) 92/54  Pulse: (!) 53  SpO2: 98%  Weight: 56.1 kg (123 lb 9.6 oz)    PHYSICAL EXAM:  General:  Thin Well appearing. No resp difficulty General:  Well appearing. No resp difficulty HEENT: normal Neck: supple. no JVD. Carotids 2+ bilat; no bruits. No lymphadenopathy  or thryomegaly appreciated. Cor: PMI nondisplaced. Regular rate & rhythm. 3/6 MR soft SEM at RUSB  Lungs: clear Abdomen: soft, nontender, nondistended. No hepatosplenomegaly. No bruits or masses. Good bowel sounds. Extremities: no cyanosis, clubbing, rash, edema Neuro: alert & orientedx3, cranial nerves grossly intact. moves all 4 extremities w/o difficulty. Affect pleasant  AF 44 with LVH and repol. Personally reviewed    ASSESSMENT & PLAN:  1. 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 No LGE - Cath 8/20 normal cors - Echo 8/20 EF 25-30% severe MR. Severe LAE - TEE 8/20 EF 25-30% severe MR. Moderately reduced RV function - Echo 05/06/19 EF 25-30% with severe MR and mild to moderate AI. RVEF moderately decreased.  - CPX in 9/20 with severe limitation. Repeat [facility]X at [facility] 3/21 improved but still severe. - Stable NYHA III sympotms - Volume status looks good. BP soft but stable - Continue Entresto 24/26 bid - Continue Bidil 1 tab tid - Continue Toprol XL 12.5 mg daily - Continue spiro 12.5 mg daily - Continue digoxin 0.125 mg daily - BP remains to low to titrate meds further or add SGLT2i. - With marked bradycardia with stop digoxin and see how he does - Has met with Dr. Mosetta Pigeon with [facility]plant team. Continue  transplant w/u. - Blood type O-pos - He has been referred for ICD. Saw Dr. Lovena Le in 12/20 who was waiting on further titration of GDMT. Will reschedule - Check labs today.  2. Permanent AF - Toerating rate control well.  - Continue apixaban. No  bleeding - likely not candidate for RFA or DC-CV given severe LAE - rate slow today will stop digoxin  3. Severe MR - TEE also suggests mild to moderate MS so may not be candidate for Clip if not transplant candidate. 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 Tobacco use, ongoing - has been quit since 3/20. No change   Glori Bickers, MD  11:08 AM

## 2019-11-11 NOTE — Telephone Encounter (Signed)
Please read

## 2019-11-11 NOTE — Addendum Note (Signed)
Encounter addended by: Marisa Hua, RN on: 11/11/2019 11:16 AM  Actions taken: Diagnosis association updated, Order list changed, Medication long-term status modified, Charge Capture section accepted, Clinical Note Signed

## 2019-11-11 NOTE — Patient Instructions (Addendum)
STOP Digoxin   Labs today We will only contact you if something comes back abnormal or we need to make some changes. Otherwise no news is good news!   You have been referred to Dr Ladona Ridgel with Electrophysiology for an ICD.  You will get a call to schedule this appointment.    Your physician has recommended that you have a defibrillator inserted. An implantable cardioverter defibrillator (ICD) is a small device that is placed in your chest or, in rare cases, your abdomen. This device uses electrical pulses or shocks to help control life-threatening, irregular heartbeats that could lead the heart to suddenly stop beating (sudden cardiac arrest). Leads are attached to the ICD that goes into your heart. This is done in the hospital and usually requires an overnight stay. Please see the instruction sheet given to you today for more information.   Your physician recommends that you schedule a follow-up appointment in: 4 months with Dr Gala Romney  Please call office at [phone] option 2 if you have any questions or concerns.   At the [facility], you and your health needs are our priority. As part of our continuing mission to provide you with exceptional heart care, we have created designated Provider Care Teams. These Care Teams include your primary Cardiologist (physician) and Advanced Practice Providers (APPs- Physician Assistants and Nurse Practitioners) who all work together to provide you with the care you need, when you need it.   You may see any of the following providers on your designated Care Team at your next follow up: Marland Kitchen Dr Arvilla Meres . Dr Marca Ancona . Tonye Becket, NP . Robbie Lis, PA . Karle Plumber, PharmD   Please be sure to bring in all your medications bottles to every appointment.

## 2019-12-04 ENCOUNTER — Telehealth: Payer: Self-pay

## 2019-12-04 ENCOUNTER — Other Ambulatory Visit: Payer: Self-pay

## 2019-12-04 DIAGNOSIS — I482 Chronic atrial fibrillation, unspecified: Secondary | ICD-10-CM

## 2019-12-04 DIAGNOSIS — I5022 Chronic systolic (congestive) heart failure: Secondary | ICD-10-CM

## 2019-12-04 MED ORDER — METOPROLOL SUCCINATE ER 25 MG PO TB24: 25.0000 mg | ORAL_TABLET | Freq: Every day | ORAL | 3 refills | Status: DC

## 2019-12-04 MED ORDER — APIXABAN 5 MG PO TABS: 5.0000 mg | ORAL_TABLET | Freq: Two times a day (BID) | ORAL | 3 refills | Status: DC

## 2019-12-04 MED ORDER — ENTRESTO 24-26 MG PO TABS: 1.0000 | ORAL_TABLET | Freq: Two times a day (BID) | ORAL | 3 refills | Status: DC

## 2019-12-04 NOTE — Telephone Encounter (Signed)
Done

## 2019-12-17 ENCOUNTER — Ambulatory Visit: Payer: Medicaid Other | Admitting: Internal Medicine

## 2020-01-03 ENCOUNTER — Other Ambulatory Visit: Payer: Self-pay

## 2020-01-06 ENCOUNTER — Ambulatory Visit: Payer: Medicaid Other | Admitting: Student

## 2020-01-23 ENCOUNTER — Ambulatory Visit: Payer: Medicaid Other | Admitting: Internal Medicine

## 2020-02-06 ENCOUNTER — Other Ambulatory Visit: Payer: Self-pay

## 2020-02-06 DIAGNOSIS — I5022 Chronic systolic (congestive) heart failure: Secondary | ICD-10-CM

## 2020-02-06 DIAGNOSIS — I482 Chronic atrial fibrillation, unspecified: Secondary | ICD-10-CM

## 2020-02-06 MED ORDER — METOPROLOL SUCCINATE ER 25 MG PO TB24: 25.0000 mg | ORAL_TABLET | Freq: Every day | ORAL | 3 refills | Status: DC

## 2020-02-06 MED ORDER — APIXABAN 5 MG PO TABS: 5.0000 mg | ORAL_TABLET | Freq: Two times a day (BID) | ORAL | 3 refills | Status: DC

## 2020-02-06 MED ORDER — SPIRONOLACTONE 25 MG PO TABS: 12.5000 mg | ORAL_TABLET | Freq: Every day | ORAL | 2 refills | Status: DC

## 2020-02-06 MED ORDER — ENTRESTO 24-26 MG PO TABS: 1.0000 | ORAL_TABLET | Freq: Two times a day (BID) | ORAL | 3 refills | Status: DC

## 2020-02-17 ENCOUNTER — Encounter: Payer: Self-pay | Admitting: Cardiology

## 2020-02-17 ENCOUNTER — Ambulatory Visit (INDEPENDENT_AMBULATORY_CARE_PROVIDER_SITE_OTHER): Payer: Medicaid Other | Admitting: Cardiology

## 2020-02-17 ENCOUNTER — Other Ambulatory Visit: Payer: Self-pay

## 2020-02-17 VITALS — BP 91/65 | HR 67 | Resp 16 | Ht 64.0 in | Wt 126.6 lb

## 2020-02-17 DIAGNOSIS — I340 Nonrheumatic mitral (valve) insufficiency: Secondary | ICD-10-CM

## 2020-02-17 DIAGNOSIS — I5022 Chronic systolic (congestive) heart failure: Secondary | ICD-10-CM

## 2020-02-17 DIAGNOSIS — I4821 Permanent atrial fibrillation: Secondary | ICD-10-CM

## 2020-02-17 NOTE — Progress Notes (Signed)
Patient seen by Dr. Rosemary Holms

## 2020-02-17 NOTE — Progress Notes (Signed)
  Patient referred by [name], [name], FNP for chest pain, shortness of breath  Subjective:   Gary [name], male    DOB: 02/27/1976, 44 y.o.   MRN: [id number]   Chief Complaint  Patient presents with  . HFrEF  . 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  Patient is doing well. He denies chest pain, shortness of breath, palpitations, leg edema, orthopnea, PND, TIA/syncope. He has regular f/u w/[facility] transplant team, where he undergoes regular cardiopulmonary exercise testing.    Current Outpatient Medications on File Prior to Visit  Medication Sig Dispense Refill  . furosemide (LASIX) 40 MG tablet Take 1 pill every morning. Take additional 1 pill in the afternoon on days with excessive shortness of breath, cough. 180 tablet 1  . isosorbide-hydrALAZINE (BIDIL) 20-37.5 MG tablet Take 1 tablet by mouth 3 (three) times daily. 270 tablet 2  . metoprolol succinate (TOPROL XL) 25 MG 24 hr tablet Take 1 tablet (25 mg total) by mouth daily. 90 tablet 3  . sacubitril-valsartan (ENTRESTO) 24-26 MG Take 1 tablet by mouth 2 (two) times daily. 180 tablet 3  . spironolactone (ALDACTONE) 25 MG tablet Take 0.5 tablets (12.5 mg total) by mouth daily. 90 tablet 2  . apixaban (ELIQUIS) 5 MG TABS tablet Take 1 tablet (5 mg total) by mouth 2 (two) times daily. 180 tablet 3   No current facility-administered medications on file prior to visit.    Cardiovascular studies:  Echocardiogram 05/06/2019: Mild LVH, EF 30-35%. LV noncompaction Moderate RV systolic dysfunction. Severe LA dilatation. Myxomatous severe mitral regurgitation. Moderate tricuspid regurgitation. The aortic valve has an indeterminant number of cusps Aortic valve regurgitation is moderate to severe by color flow  Doppler. Moderately elevated pulmonary artery systolic pressure. The atrial septum is grossly normal.  cMRI 08/2017: 1. Severely dilated left ventricular size, with normal thickness and severely decreased systolic function (LVEF =31%). There diffuse hypokinesis and paradoxical septal motion. 2. There is no late gadolinium enhancement in the left ventricular myocardium. 2. Normal right ventricular size, thickness and mildly decreased systolic function (LVEF = 42%). There are no regional wall motion Abnormalities. 3.There are significant trabeculations in the left ventricular apex with non-compacted to compacted myocardium ratio: 2.6:1. This is consistent with non-compaction cardiomyopathy. 4. Moderately dilated left atrium and mildly dilated right atrial size. 5. Mild to moderate aortic insufficiency. Moderate mitral regurgitation with posteriorly directed jet. Mild tricuspid regurgitation. No significant pulmonary regurgitation is seen.  EKG 01/31/2019: Atrial fibrillation with controlled ventricular response  Voltage criteria for LVH  T wave inversion abnormality  -Possible  Inferior and lateral  ischemia.     Recent labs: 12/31/2019: Glucose 94, BUN/Cr 19/1.1. EGFR 81. Na/K 135/3.5. Rest of the CMP normal   Review of Systems  Cardiovascular: Negative for chest pain, dyspnea on exertion, leg swelling, palpitations and syncope.         Objective:      Vitals:   08/22/19 1437  BP: (!) 109/48  Pulse: (!) 56  Resp: 16  Temp: 98.2 F (36.8 C)  SpO2: 98%     Body mass index is 21.11 kg/m. Filed Weights   08/22/19 1437  Weight: 123 lb (55.8 kg)    Physical Exam Vitals and nursing note reviewed.  Constitutional:      General:   He is not in acute distress. Neck:     Vascular: No JVD.  Cardiovascular:     Rate and Rhythm: Normal rate and regular rhythm.     Heart sounds: Normal heart sounds. No murmur heard.   Pulmonary:     Effort: Pulmonary effort is  normal.     Breath sounds: Normal breath sounds. No wheezing or rales.          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  Noncompaction cardiomyopathy: EF remains 30-35% on echocardiogram on 05/06/2019. NYHA II. Patient is currently being evaluated for heart transplant and at [facility].  Currently on Entresto 24-26 mg twice daily, BiDil 1 tab 3 times daily, metoprolol succinate 25 mg daily, spironolactone 12.5 mg daily, digoxin 0.125 mg daily.   Will check echocardiogram. Continue transplant workup with [facility].  Aortic and mitral regurgitation: Both appear moderate. Will conitnue to monitor. Given that EF remains low, he may better benefit from transplant than  mitraclip alone.   Permanent atrial fibrillation: Rate controlled. I would consider rhythm control after optimizing his heart failure therapy.  CHA2DSVasc score 1 with annual  Stroke risk 0.6%. However, given his compaction cardiomyopathy, his stroke risk is higher.  Continue anticoagulation with apixaban 5 mg bid.   Continue f/u w/Dr. Kendrick Ranch and [facility].   [name], MD [facility] Pager: [phone] Office: [phone] If no answer Cell [phone]

## 2020-03-02 ENCOUNTER — Other Ambulatory Visit: Payer: Self-pay

## 2020-03-02 ENCOUNTER — Other Ambulatory Visit: Payer: Medicaid Other

## 2020-03-02 ENCOUNTER — Ambulatory Visit (INDEPENDENT_AMBULATORY_CARE_PROVIDER_SITE_OTHER): Payer: Medicaid Other | Admitting: Internal Medicine

## 2020-03-02 VITALS — BP 88/66 | HR 67 | Ht 64.0 in | Wt 129.0 lb

## 2020-03-02 DIAGNOSIS — I5022 Chronic systolic (congestive) heart failure: Secondary | ICD-10-CM | POA: Diagnosis not present

## 2020-03-02 DIAGNOSIS — I4821 Permanent atrial fibrillation: Secondary | ICD-10-CM

## 2020-03-02 NOTE — Patient Instructions (Addendum)
Medication Instructions:  Your physician recommends that you continue on your current medications as directed. Please refer to the Current Medication list given to you today.  Labwork: None ordered.  Testing/Procedures: None ordered.  Follow-Up: Your physician wants you to follow-up in: based on results of Echo.    Any Other Special Instructions Will Be Listed Below (If Applicable).  If you need a refill on your cardiac medications before your next appointment, please call your pharmacy.

## 2020-03-02 NOTE — Progress Notes (Signed)
HPI [name] returns today for ongoing evaluation and management of chronic systolic heart failure.  He is here to discuss insertion of an ICD.  He is a very pleasant 44 year old man with a nonischemic cardiomyopathy and chronic systolic heart failure.  His heart failure symptoms are markedly improved with the advent of maximal medical therapy.  The patient has not had syncope.  He is pending a repeat 2D echo to assess his left ventricular function.  He denies peripheral edema.  He is back working in [employer]. Allergies  Allergen Reactions  . Aspirin Other (See Comments)    Internal bleeding      Current Outpatient Medications  Medication Sig Dispense Refill  . apixaban (ELIQUIS) 5 MG TABS tablet Take 1 tablet (5 mg total) by mouth 2 (two) times daily. 180 tablet 3  . furosemide (LASIX) 40 MG tablet Take 1 pill every morning. Take additional 1 pill in the afternoon on days with excessive shortness of breath, cough. 180 tablet 1  . isosorbide-hydrALAZINE (BIDIL) 20-37.5 MG tablet Take 1 tablet by mouth 3 (three) times daily. 270 tablet 2  . metoprolol succinate (TOPROL XL) 25 MG 24 hr tablet Take 1 tablet (25 mg total) by mouth daily. 90 tablet 3  . sacubitril-valsartan (ENTRESTO) 24-26 MG Take 1 tablet by mouth 2 (two) times daily. 180 tablet 3  . spironolactone (ALDACTONE) 25 MG tablet Take 0.5 tablets (12.5 mg total) by mouth daily. 90 tablet 2   No current facility-administered medications for this visit.     Past Medical History:  Diagnosis Date  . Acute on chronic combined systolic and diastolic CHF, NYHA class 3 (HCC) 01/11/2019  . Acute on chronic congestive heart failure (HCC)   . Acute on chronic systolic and diastolic heart failure, NYHA class 1 (HCC) 02/17/2019  . Atrial fibrillation (HCC)   . CHF (congestive heart failure) (HCC)   . Chronic atrial fibrillation (HCC) 10/27/2018  . Chronic systolic heart failure (HCC) 05/16/2019  . Dysrhythmia    hx. of  A-fib   . Former smoker    Quit  . Hyperglycemia 01/11/2019  . Hypertension   . Incarcerated inguinal hernia 10/13/2017  . Internal bleeding    DUE TO ASPIRIN, OR MEDS  . Multifocal pneumonia 01/11/2019  . Murmur   . 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 Protein-calorie malnutrition, severe 10/17/2017  . Severe mitral regurgitation 01/31/2019    ROS:   All systems reviewed and negative except as noted in the HPI.   Past Surgical History:  Procedure Laterality Date  . ABDOMINAL SURGERY    . ABDOMINAL SURGERY     under 10 yrs. old or younger  . CARDIAC SURGERY    . CARDIOVERSION N/A 08/17/2017   Procedure: CARDIOVERSION;  Surgeon: Orpah Cobb, MD;  Location: [facility] ENDOSCOPY;  Service: Cardiovascular;  Laterality: N/A;  . HERNIA REPAIR    . INGUINAL HERNIA REPAIR Right 10/13/2017   Procedure: OPEN RIGHT INGUINAL HERNIA REPAIR ERAS PATHWAY;  Surgeon: Jimmye Norman, MD;  Location: [facility] OR;  Service: General;  Laterality: Right;  . OPEN HEART SURGERY     AS A CHILD , at 2 yrs. old. done in [place]  . RIGHT/LEFT HEART CATH AND CORONARY ANGIOGRAPHY N/A 02/19/2019   Procedure: RIGHT/LEFT HEART CATH AND CORONARY ANGIOGRAPHY;  Surgeon: Elder Negus, MD;  Location: [facility] INVASIVE CV LAB;  Service: Cardiovascular;  Laterality: N/A;  . TEE WITHOUT CARDIOVERSION N/A 08/17/2017   Procedure: TRANSESOPHAGEAL ECHOCARDIOGRAM (TEE);  Surgeon: Orpah Cobb, MD;  Location: [facility] ENDOSCOPY;  Service: Cardiovascular;  Laterality: N/A;  . TEE WITHOUT CARDIOVERSION N/A 02/19/2019   Procedure: TRANSESOPHAGEAL ECHOCARDIOGRAM (TEE);  Surgeon: Elder Negus, MD;  Location: [facility] ENDOSCOPY;  Service: Cardiovascular;  Laterality: N/A;     Family History  Problem Relation Age of Onset  . Sarcoidosis Mother   . Healthy Father      Social History   Socioeconomic History  . Marital status: Single    Spouse name: Not on file  . Number of children: 8  . Years of education: Not on file  . Highest education  level: Not on file  Occupational History  . Not on file  Tobacco Use  . Smoking status: Former Smoker    Packs/day: 1.00    Years: 20.00    Pack years: 20.00    Types: Cigarettes    Quit date: 09/09/2018    Years since quitting: 1.4  . Smokeless tobacco: Never Used  Vaping Use  . Vaping Use: Never used  Substance and Sexual Activity  . Alcohol use: Yes    Comment: occ  . Drug use: Not Currently    Types: Marijuana  . Sexual activity: Not on file  Other Topics Concern  . Not on file  Social History Narrative  . Not on file   Social Determinants of Health   Financial Resource Strain:   . Difficulty of Paying Living Expenses: Not on file  Food Insecurity:   . Worried About Programme researcher, broadcasting/film/video in the Last Year: Not on file  . Ran Out of Food in the Last Year: Not on file  Transportation Needs:   . Lack of Transportation (Medical): Not on file  . Lack of Transportation (Non-Medical): Not on file  Physical Activity:   . Days of Exercise per Week: Not on file  . Minutes of Exercise per Session: Not on file  Stress:   . Feeling of Stress : Not on file  Social Connections:   . Frequency of Communication with Friends and Family: Not on file  . Frequency of Social Gatherings with Friends and Family: Not on file  . Attends Religious Services: Not on file  . Active Member of Clubs or Organizations: Not on file  . Attends Banker Meetings: Not on file  . Marital Status: Not on file  Intimate Partner Violence:   . Fear of Current or Ex-Partner: Not on file  . Emotionally Abused: Not on file  . Physically Abused: Not on file  . Sexually Abused: Not on file     BP (!) 88/66   Pulse 67   Ht 5\' 4"  (1.626 m)   Wt 129 lb (58.5 kg)   SpO2 97%   BMI 22.14 kg/m   Physical Exam:  Well appearing NAD HEENT: Unremarkable Neck:  No JVD, no thyromegally Lymphatics:  No adenopathy Back:  No CVA tenderness Lungs:  Clear, with no wheezes, rales, or rhonchi HEART:   Regular rate rhythm, 2/6 systolic murmurs consistent with mitral regurgitation, no rubs, no clicks Abd:  soft, positive bowel sounds, no organomegally, no rebound, no guarding Ext:  2 plus pulses, no edema, no cyanosis, no clubbing Skin:  No rashes no nodules Neuro:  CN II through XII intact, motor grossly intact  EKG -reviewed.  QRS duration 118   Assess/Plan: 1.  Chronic systolic heart failure -he is on maximal medical therapy and has class II symptoms.  He is pending a repeat 2D echo.  If his EF is less than 35%, I have recommended ICD insertion to the patient.  We discussed the risk, goals, benefits, and expectations of the procedure and if his EF remains low, he will undergo ICD implantation. 2.  Chronic atrial fibrillation -his ventricular rate appears to be well controlled.  He will continue his current medications.  Lewayne Bunting, MD

## 2020-03-03 ENCOUNTER — Other Ambulatory Visit: Payer: Self-pay | Admitting: Cardiology

## 2020-03-03 DIAGNOSIS — I5022 Chronic systolic (congestive) heart failure: Secondary | ICD-10-CM

## 2020-03-23 ENCOUNTER — Ambulatory Visit (HOSPITAL_COMMUNITY)
Admission: RE | Admit: 2020-03-23 | Discharge: 2020-03-23 | Disposition: A | Discharge status: Home / Self Care | Payer: Medicaid Other | Source: Ambulatory Visit | Attending: Internal Medicine | Admitting: Internal Medicine

## 2020-03-23 ENCOUNTER — Encounter (HOSPITAL_COMMUNITY): Payer: Self-pay | Admitting: Internal Medicine

## 2020-03-23 ENCOUNTER — Other Ambulatory Visit: Payer: Self-pay

## 2020-03-23 VITALS — BP 86/54 | HR 53 | Ht 64.0 in | Wt 129.8 lb

## 2020-03-23 DIAGNOSIS — I428 Other cardiomyopathies: Secondary | ICD-10-CM | POA: Insufficient documentation

## 2020-03-23 DIAGNOSIS — Z87891 Personal history of nicotine dependence: Secondary | ICD-10-CM | POA: Diagnosis not present

## 2020-03-23 DIAGNOSIS — I340 Nonrheumatic mitral (valve) insufficiency: Secondary | ICD-10-CM | POA: Diagnosis not present

## 2020-03-23 DIAGNOSIS — I5022 Chronic systolic (congestive) heart failure: Secondary | ICD-10-CM | POA: Diagnosis not present

## 2020-03-23 DIAGNOSIS — I4821 Permanent atrial fibrillation: Secondary | ICD-10-CM | POA: Diagnosis not present

## 2020-03-23 DIAGNOSIS — Z79899 Other long term (current) drug therapy: Secondary | ICD-10-CM | POA: Insufficient documentation

## 2020-03-23 DIAGNOSIS — Z7901 Long term (current) use of anticoagulants: Secondary | ICD-10-CM | POA: Insufficient documentation

## 2020-03-23 DIAGNOSIS — Q898 Other specified congenital malformations: Secondary | ICD-10-CM | POA: Diagnosis not present

## 2020-03-23 DIAGNOSIS — I110 Hypertensive heart disease with heart failure: Secondary | ICD-10-CM | POA: Diagnosis present

## 2020-03-23 LAB — COMPREHENSIVE METABOLIC PANEL
ALT: 20 U/L (ref 0–44)
AST: 22 U/L (ref 15–41)
Albumin: 3.8 g/dL (ref 3.5–5.0)
Alkaline Phosphatase: 87 U/L (ref 38–126)
Anion gap: 10 (ref 5–15)
BUN: 11 mg/dL (ref 6–20)
CO2: 22 mmol/L (ref 22–32)
Calcium: 8.4 mg/dL — ABNORMAL LOW (ref 8.9–10.3)
Chloride: 105 mmol/L (ref 98–111)
Creatinine, Ser: 0.87 mg/dL (ref 0.61–1.24)
GFR calc Af Amer: >60 mL/min (ref >60)
GFR calc non Af Amer: >60 mL/min (ref >60)
Glucose, Bld: 92 mg/dL (ref 70–99)
Potassium: 3.9 mmol/L (ref 3.5–5.1)
Sodium: 137 mmol/L (ref 135–145)
Total Bilirubin: 1.3 mg/dL — ABNORMAL HIGH (ref 0.3–1.2)
Total Protein: 6.2 g/dL — ABNORMAL LOW (ref 6.5–8.1)

## 2020-03-23 LAB — BRAIN NATRIURETIC PEPTIDE: B Natriuretic Peptide: 818.8 pg/mL — ABNORMAL HIGH (ref 0.0–100.0)

## 2020-03-23 LAB — CBC
HCT: 36.7 % — ABNORMAL LOW (ref 39.0–52.0)
Hemoglobin: 12.4 g/dL — ABNORMAL LOW (ref 13.0–17.0)
MCH: 30.5 pg (ref 26.0–34.0)
MCHC: 33.8 g/dL (ref 30.0–36.0)
MCV: 90.2 fL (ref 80.0–100.0)
Platelets: 152 10*3/uL (ref 150–400)
RBC: 4.07 MIL/uL — ABNORMAL LOW (ref 4.22–5.81)
RDW: 12.1 % (ref 11.5–15.5)
WBC: 5.1 10*3/uL (ref 4.0–10.5)
nRBC: 0 % (ref 0.0–0.2)

## 2020-03-23 MED ORDER — BIDIL 20-37.5 MG PO TABS: 1.0000 | ORAL_TABLET | Freq: Three times a day (TID) | ORAL | 2 refills | Status: DC

## 2020-03-23 MED ORDER — SPIRONOLACTONE 25 MG PO TABS: 12.5000 mg | ORAL_TABLET | Freq: Every day | ORAL | 2 refills | Status: DC

## 2020-03-23 NOTE — Patient Instructions (Addendum)
Please call our office in January to schedule your follow up appointment  Labs done today, your results will be available in MyChart, we will contact you for abnormal readings.  Refills of medications were sent to your pharmacy  Your physician has recommended that you have a cardiopulmonary stress test (CPX). CPX testing is a non-invasive measurement of heart and lung function. It replaces a traditional treadmill stress test. This type of test provides a tremendous amount of information that relates not only to your present condition but also for future outcomes. This test combines measurements of you ventilation, respiratory gas exchange in the lungs, electrocardiogram (EKG), blood pressure and physical response before, during, and following an exercise protocol.    If you have any questions or concerns before your next appointment please send [place] a message through [place] or call our office at [phone].    TO LEAVE A MESSAGE FOR THE NURSE SELECT OPTION 2, PLEASE LEAVE A MESSAGE INCLUDING: . YOUR NAME . DATE OF BIRTH . CALL BACK NUMBER . REASON FOR CALL**this is important as we prioritize the call backs  YOU WILL RECEIVE A CALL BACK THE SAME DAY AS LONG AS YOU CALL BEFORE 4:00 PM  At the [facility], you and your health needs are our priority. As part of our continuing mission to provide you with exceptional heart care, we have created designated Provider Care Teams. These Care Teams include your primary Cardiologist (physician) and Advanced Practice Providers (APPs- Physician Assistants and Nurse Practitioners) who all work together to provide you with the care you need, when you need it.   You may see any of the following providers on your designated Care Team at your next follow up: Marland Kitchen Dr Arvilla Meres . Dr Marca Ancona . Tonye Becket, NP . Robbie Lis, PA . Karle Plumber, PharmD   Please be sure to bring in all your medications bottles to every appointment.

## 2020-03-23 NOTE — Progress Notes (Signed)
ADVANCED HF CLINIC NOTE  PCP: [name], FNP Primary Cardiologist: Dr. 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   Echo here 05/06/19 confirmed EF 25-30% with severe MR and mild to moderate AI. RVEF moderately decreased. Personally reviewed  CPX test 9/20 with severe HF limitation. Repeat CPX test at [facility] [facility] was improved  Here for routine f/u. Has been following with Dr. Mosetta Pigeon and being worked up for transplant. (Not listed yet). He saw Dr. Mosetta Pigeon in clinic 12/31/19 who felt he was nearing transplant based on high-risk CPX but needed a few more thing as below:  - random nicotine screen  - needs follow-up with psychology and social work - CT with contrast (will arrange for next visit) to understand orientation of organs - apixaban to warfarin when listed  Feels ok. Still working at [employer]. Feels much better but still with good days and bad days. Busy taking care of his 4 girls (10,12,15,17). Can get around and do his thing as long as he has breaks. No edema, orthopnea or PND.   Echo 03/03/20 with Dr. Virgina Jock EF read as 40-45% Moderate-severe AI, severe MR, mod-severe TR. Saw Dr Lovena Le. No ICD based on EF improvement.   Cardiac studies:  cMRI 2/19 1. Severely dilated left ventricular size, with normal thickness and severely decreased systolic function (LVEF =09%). There diffuse hypokinesis and paradoxical septal motion.There is no late gadolinium enhancement in the left ventricular myocardium. 2. Normal right ventricular size, thickness and mildly decreasedsystolic function (LVEF = 42%).  3.There are significant trabeculations in the left ventricular apex with non-compacted to compacted myocardium ratio: 2.6:1. This is consistent with  non-compaction cardiomyopathy. 4. Moderately dilated left atrium and mildly dilated right  5. Mild to moderate aortic insufficiency. Moderate mitral regurgitation with posteriorly directed jet. Mild tricuspid regurgitation. No significant pulmonary regurgitation is seen.  -Echo 8/20 EF 25-30% with severe non-compaction. Severe MR with severe LAE  - TEE 8/20 EF 25-30% moderately reduced RV function severe MR, mod TR  R/L cath normal coronaries RA =  3 RV =  45/2 PA  = 45/12 (28) PCWP = 18 with large v-waves Fick = 4.0/2.6  Repeat CPX at [facility] 3/21 pVO2 19.7 (45%) VeVCO2 31 RER 1.36  CPX 9/20  FVC 2.28 (62%)    FEV1 1.79 (60%)     FEV1/FVC 79 (96%)     MVV 72(50%)   Resting HR: 73 Standing HR: 76 Peak HR: 110  (63% age predicted max HR)  BP rest: 102/60 Standing HR: 108/60 BP peak: 124/62  Peak VO2: 15.7 (41% predicted peak VO2)  VE/VCO2 slope: 55  OUES: 0.83  Peak RER: 1.11  Ventilatory Threshold: 13.6 (36% predicted or 87% measured peak VO2)  VE/MVV: 60%  O2pulse: 7  (64% predicted O2pulse)   Had echo at [facility] in 9/20 an EF read as 50-55%.    ROS: All systems negative except as listed in HPI, PMH and Problem List.  SH:  Social History   Socioeconomic History   Marital status: Single    Spouse name: Not on file   Number of children: 8   Years of education: Not on file   Highest education level: Not on file  Occupational History   Not on file  Tobacco Use   Smoking status:  Former Smoker    Packs/day: 1.00    Years: 20.00    Pack years: 20.00    Types: Cigarettes    Quit date: 09/09/2018    Years since quitting: 1.5   Smokeless tobacco: Never Used  Vaping Use   Vaping Use: Never used  Substance and Sexual Activity   Alcohol use: Yes    Comment: occ   Drug use: Not Currently    Types: Marijuana   Sexual activity: Not on file  Other Topics Concern   Not on file  Social History Narrative   Not on file   Social Determinants  of Health   Financial Resource Strain:    Difficulty of Paying Living Expenses: Not on file  Food Insecurity:    Worried About Charity fundraiser in the Last Year: Not on file   [employer] of Food in the Last Year: Not on file  Transportation Needs:    Lack of Transportation (Medical): Not on file   Lack of Transportation (Non-Medical): Not on file  Physical Activity:    Days of Exercise per Week: Not on file   Minutes of Exercise per Session: Not on file  Stress:    Feeling of Stress : Not on file  Social Connections:    Frequency of Communication with Friends and Family: Not on file   Frequency of Social Gatherings with Friends and Family: Not on file   Attends Religious Services: Not on file   Active Member of Clubs or Organizations: Not on file   Attends Archivist Meetings: Not on file   Marital Status: Not on file  Intimate Partner Violence:    Fear of Current or Ex-Partner: Not on file   Emotionally Abused: Not on file   Physically Abused: Not on file   Sexually Abused: Not on file    FH:  Family History  Problem Relation Age of Onset   Sarcoidosis Mother    Healthy Father     Past Medical History:  Diagnosis Date   Acute on chronic combined systolic and diastolic CHF, NYHA class 3 ([place]) 01/11/2019   Acute on chronic congestive heart failure ([place])    Acute on chronic systolic and diastolic heart failure, NYHA class 1 ([place]) 02/17/2019   Atrial fibrillation (H[facility])    CHF (congestive heart failure) ([place])    Chronic atrial fibrillation ([place]) 7/59/1638   Chronic systolic heart failure ([place]) 05/16/2019   Dysrhythmia    hx. 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 180 tablet 3   furosemide (LASIX) 40 MG tablet Take 1 pill every morning. Take additional 1 pill in the afternoon on days with excessive shortness of breath, cough. 180 tablet 1   isosorbide-hydrALAZINE (BIDIL) 20-37.5 MG tablet Take 1 tablet by mouth 3 (three) times daily. 270 tablet 2   metoprolol succinate (TOPROL XL) 25 MG 24 hr tablet Take 1 tablet (25 mg total) by mouth daily. 90 tablet 3   sacubitril-valsartan (ENTRESTO) 24-26  MG Take 1 tablet by mouth 2 (two) times daily. 180 tablet 3   spironolactone (ALDACTONE) 25 MG tablet Take 0.5 tablets (12.5 mg total) by mouth daily. 90 tablet 2   No current facility-administered medications for this encounter.    Vitals:   03/23/20 1029  BP: (!) 84/56  Pulse: (!) 53  SpO2: 100%  Weight: 58.9 kg (129 lb 12.8 oz)  Height: '5\' 4"'  (1.626 m)    PHYSICAL EXAM:  General:  Thin Well appearing. No resp difficulty HEENT: normal Neck: supple. no JVD. Carotids 2+ bilat; no bruits. No lymphadenopathy or thryomegaly appreciated. Cor: PMI nondisplaced. Irregular. Loletha Grayer. 2/6 AI 3/6 MR Lungs: clear Abdomen: soft, nontender, nondistended. No hepatosplenomegaly. No bruits or masses. Good bowel sounds. Extremities: no cyanosis, clubbing, rash, edema Neuro: alert & orientedx3, cranial nerves grossly intact. moves all 4 extremities w/o difficulty. Affect pleasant   ASSESSMENT & PLAN:  1. 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 No LGE - Cath 8/20 normal cors - Echo 8/20 EF 25-30% severe MR. Severe LAE - TEE 8/20 EF 25-30% severe MR. Moderately reduced RV function - Echo 05/06/19 EF 25-30% with severe MR and mild to moderate AI. RVEF moderately decreased.  - CPX in 9/20 with severe limitation. Repeat  CPX at [facility] 3/21 improved but still severe. - Echo 03/03/20 with Dr. Virgina Jock EF read as 40-45% Moderate-severe AI, severe MR, mod-severe TR. Saw Dr Lovena Le. No ICD based on EF improvement.  - Improved NYHA II-III - Volume status looks good. BP soft but stable - Continue Entresto 24/26 bid - Continue Bidil 1 tab tid - Continue Toprol XL 12.5 mg daily - Continue spiro 12.5 mg daily - Off digoxin due to severe bradycardia in 40s - BP remains to low to titrate meds further or add SGLT2i. - Has met with Dr. Mosetta Pigeon with [facility]plant team. Continue transplant w/u. - Blood type O-pos - He has been referred for ICD. Saw Dr. Lovena Le and EF felt to be too good for ICD. - Overall continues to improve slowly. NYHA II-III. I have d/w Dr. Mosetta Pigeon. Will plan to repeat CPX test prior to [facility] f/u at the end of the month.    2. Permanent AF - Toerating rate control well.  - Continue apixaban. No  bleeding - likely not candidate for RFA or DC-CV given severe LAE - rate somewhat improved after stopping digoxin  3. Severe MR - TEE also suggests mild to moderate MS so may not be candidate for Clip if not transplant candidate. 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 Tobacco use, ongoing - has been quit since 3/20. No change.    Gary Bickers, MD  11:07 AM

## 2020-03-23 NOTE — Addendum Note (Signed)
Encounter addended by: Dolores Patty, MD on: 03/23/2020 3:43 PM  Actions taken: Level of Service modified, Visit diagnoses modified

## 2020-04-21 ENCOUNTER — Encounter (HOSPITAL_COMMUNITY): Payer: Medicaid Other

## 2020-04-27 ENCOUNTER — Other Ambulatory Visit (HOSPITAL_COMMUNITY): Payer: Self-pay | Admitting: *Deleted

## 2020-04-27 ENCOUNTER — Ambulatory Visit (HOSPITAL_COMMUNITY): Payer: Medicaid Other | Attending: Internal Medicine

## 2020-04-27 ENCOUNTER — Other Ambulatory Visit: Payer: Self-pay

## 2020-04-27 DIAGNOSIS — I5022 Chronic systolic (congestive) heart failure: Secondary | ICD-10-CM | POA: Insufficient documentation

## 2020-06-01 ENCOUNTER — Other Ambulatory Visit: Payer: Self-pay

## 2020-06-01 DIAGNOSIS — I5022 Chronic systolic (congestive) heart failure: Secondary | ICD-10-CM

## 2020-06-01 MED ORDER — METOPROLOL SUCCINATE ER 25 MG PO TB24: 25.0000 mg | ORAL_TABLET | Freq: Every day | ORAL | 3 refills | Status: DC

## 2020-06-01 MED ORDER — ENTRESTO 24-26 MG PO TABS: 1.0000 | ORAL_TABLET | Freq: Two times a day (BID) | ORAL | 3 refills | Status: DC

## 2020-06-10 ENCOUNTER — Other Ambulatory Visit: Payer: Self-pay

## 2020-06-10 MED ORDER — ENTRESTO 24-26 MG PO TABS: 1.0000 | ORAL_TABLET | Freq: Two times a day (BID) | ORAL | 3 refills | Status: DC

## 2020-06-11 ENCOUNTER — Encounter (HOSPITAL_COMMUNITY): Payer: Self-pay

## 2020-07-13 ENCOUNTER — Other Ambulatory Visit: Payer: Self-pay

## 2020-07-19 NOTE — Progress Notes (Signed)
ADVANCED HF CLINIC NOTE  PCP: Diamantina Providence, FNP Primary Cardiologist: Dr. Rosemary Holms  HPI:  Gary Thornton Oxendineis a 45 y.o. male with congenital heart disease (chest surgery at age 72 in [place] for suspected ASD closure), heterotaxy, chronic systolic HF due to LV noncompaction with 25-30%, severe MR and permanent AF.  Echo 05/06/19 confirmed EF 25-30% with severe MR and mild to moderate AI. RVEF moderately decreased.  CPX test 9/20 with severe HF limitation. Repeat CPX test at [facility] was improved  Echo 03/03/20 with Dr. Rosemary Holms EF read as 40-45% Moderate-severe AI, severe MR, mod-severe TR. Saw Dr Ladona Ridgel. No ICD based on EF improvement.   CPX 10/21 Peak VO2: 21.1 (56% predicted peak VO2) slope: 41  Here for routine f/u. Has been following with Dr. Edwena Blow at [facility] and being worked up for transplant. (Not listed yet). He had a televisit with Dr. Edwena Blow on 04/09/20 who felt he was doing fairly well and not ready for listing yet. He will need 3 items prior to actual listing:  - random nicotine screen  - needs follow-up with psychology and social work - CT with contrast (will arrange for next visit) to understand orientation of organs - apixaban to warfarin when listed  Overall stable. Still working at [employer]. 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 Gets winded if he goes too fast but other wise doing ok. No edema, orthopnea or PND. SBP runs in 90s. No dizziness. No problems with meds. No bleeding with apixaban.    Cardiac studies:  cMRI 2/19 1. Severely dilated left ventricular size, with normal thickness and severely decreased systolic function (LVEF =31%). There diffuse hypokinesis and paradoxical septal motion.There is no late gadolinium enhancement in the left ventricular myocardium. 2. Normal right ventricular size, thickness and mildly decreasedsystolic function (LVEF = 42%).  3.There are significant trabeculations in the  left ventricular apex with non-compacted to compacted myocardium ratio: 2.6:1. This is consistent with non-compaction cardiomyopathy. 4. Moderately dilated left atrium and mildly dilated right  5. Mild to moderate aortic insufficiency. Moderate mitral regurgitation with posteriorly directed jet. Mild tricuspid regurgitation. No significant pulmonary regurgitation is seen.  -Echo 8/20 EF 25-30% with severe non-compaction. Severe MR with severe LAE  - TEE 8/20 EF 25-30% moderately reduced RV function severe MR, mod TR  R/L cath normal coronaries RA =  3 RV =  45/2 PA  = 45/12 (28) PCWP = 18 with large v-waves Fick = 4.0/2.6  Repeat CPX at [facility] 3/21 pVO2 19.7 (45%) VeVCO2 31 RER 1.36  CPX 9/20  FVC 2.28 (62%)    FEV1 1.79 (60%)     FEV1/FVC 79 (96%)     MVV 72(50%)   Resting HR: 73 Standing HR: 76 Peak HR: 110  (63% age predicted max HR)  BP rest: 102/60 Standing HR: 108/60 BP peak: 124/62  Peak VO2: 15.7 (41% predicted peak VO2)  VE/VCO2 slope: 55  OUES: 0.83  Peak RER: 1.11  Ventilatory Threshold: 13.6 (36% predicted or 87% measured peak VO2)  VE/MVV: 60%  O2pulse: 7  (64% predicted O2pulse)   Had echo at [facility] in 9/20 an EF read as 50-55%.    ROS: All systems negative except as listed in HPI, PMH and Problem List.  SH:  Social History   Socioeconomic History  . Marital status: Single    Spouse name: Not on file  . Number of children: 8  . Years of education: Not on file  .  Highest education level: Not on file  Occupational History  . Not on file  Tobacco Use  . Smoking status: Former Smoker    Packs/day: 1.00    Years: 20.00    Pack years: 20.00    Types: Cigarettes    Quit date: 09/09/2018    Years since quitting: 1.8  . Smokeless tobacco: Never Used  Vaping Use  . Vaping Use: Never used  Substance and Sexual Activity  . Alcohol use: Yes    Comment: occ  . Drug use: Not Currently    Types: Marijuana  . Sexual activity: Not on file   Other Topics Concern  . Not on file  Social History Narrative  . Not on file   Social Determinants of Health   Financial Resource Strain: Not on file  Food Insecurity: Not on file  Transportation Needs: Not on file  Physical Activity: Not on file  Stress: Not on file  Social Connections: Not on file  Intimate Partner Violence: Not on file    FH:  Family History  Problem Relation Age of Onset  . Sarcoidosis Mother   . Healthy Father     Past Medical History:  Diagnosis Date  . Acute on chronic combined systolic and diastolic CHF, NYHA class 3 (HCC) 01/11/2019  . Acute on chronic congestive heart failure (HCC)   . Acute on chronic systolic and diastolic heart failure, NYHA class 1 (HCC) 02/17/2019  . Atrial fibrillation (HCC)   . CHF (congestive heart failure) (HCC)   . Chronic atrial fibrillation (HCC) 10/27/2018  . Chronic systolic heart failure (HCC) 05/16/2019  . Dysrhythmia    hx. of  A-fib  . Former smoker    Quit  . Hyperglycemia 01/11/2019  . Hypertension   . Incarcerated inguinal hernia 10/13/2017  . Internal bleeding    DUE TO ASPIRIN, OR MEDS  . Multifocal pneumonia 01/11/2019  . Murmur   . 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 Protein-calorie malnutrition, severe 10/17/2017  . Severe mitral regurgitation 01/31/2019    Current Outpatient Medications  Medication Sig Dispense Refill  . apixaban (ELIQUIS) 5 MG TABS tablet Take 1 tablet (5 mg total) by mouth 2 (two) times daily. 180 tablet 3  . furosemide (LASIX) 40 MG tablet Take 1 pill every morning. Take additional 1 pill in the afternoon on days with excessive shortness of breath, cough. 180 tablet 1  . isosorbide-hydrALAZINE (BIDIL) 20-37.5 MG tablet Take 1 tablet by mouth 3 (three) times daily. 270 tablet 2  . metoprolol succinate (TOPROL XL) 25 MG 24 hr tablet Take 1 tablet (25 mg total) by mouth daily. 90 tablet 3  . sacubitril-valsartan (ENTRESTO) 24-26 MG Take 1 tablet by mouth 2 (two) times daily.  180 tablet 3  . spironolactone (ALDACTONE) 25 MG tablet Take 0.5 tablets (12.5 mg total) by mouth daily. 90 tablet 2   No current facility-administered medications for this encounter.    Vitals:   07/20/20 1009  BP: (!) 92/50  Pulse: 64  SpO2: 97%  Weight: 59.4 kg (131 lb)    PHYSICAL EXAM:  General:  Thin Well appearing. No resp difficulty HEENT: normal Neck: supple. no JVD. Carotids 2+ bilat; no bruits. No lymphadenopathy or thryomegaly appreciated. Cor: PMI nondisplaced. Irregular rate & rhythm. 3/6 MR Lungs: clear Abdomen: soft, nontender, nondistended. No hepatosplenomegaly. No bruits or masses. Good bowel sounds. Extremities: no cyanosis, clubbing, rash, edema Neuro: alert & orientedx3, cranial nerves grossly intact. moves all 4 extremities w/o difficulty. Affect pleasant  ASSESSMENT & PLAN:  1. 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 No LGE - Cath 8/20 normal cors - Echo 8/20 EF 25-30% severe MR. Severe LAE - TEE 8/20 EF 25-30% severe MR. Moderately reduced RV function - Echo 05/06/19 EF 25-30% with severe MR and mild to moderate AI. RVEF moderately decreased.  - CPX in 9/20 with severe limitation pVO2: 15.7 (41% predicted peak VO2) slope 55 - Repeat CPX at [facility] 3/21 improved. pVO2 19.7 (45%) VeVCO2 31 - CPX 10/21 Peak VO2: 21.1 (56% predicted peak VO2) slope: 41  - Echo 03/03/20 with Dr. Rosemary Holms EF read as 40-45% Moderate-severe AI, severe MR, mod-severe TR. Saw Dr Ladona Ridgel. No ICD based on EF improvement.  - Improved NYHA II-III - Volume status looks good. BP soft but stable - Continue Entresto 24/26 bid - Continue Bidil 1 tab tid - Continue Toprol XL 12.5 mg daily - Continue spiro 12.5 mg daily - Will try Farxiga 5mg  and see if he can tolerate - Off digoxin due to severe bradycardia in 40s - Blood type O-pos - He has been referred for ICD. Saw Dr. and EF felt to be too good for ICD. Recent EF 40-45%  precludes.  - Overall continues to improve slowly. NYHA II-III. I have d/w Dr. Ladona Ridgel. CPX stable continue to follow.   2. Permanent AF - Toerating rate control well.  - Continue apixaban. No bleeding - likely not candidate for RFA or DC-CV given severe LAE - rate somewhat improved after stopping digoxin  3. Severe MR - TEE also suggests mild to moderate MS so may not be candidate for Clip if not transplant candidate. 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 Tobacco use, ongoing - has been quit since 3/20. No change.   4/20, MD  1:12 PM

## 2020-07-20 ENCOUNTER — Ambulatory Visit (HOSPITAL_COMMUNITY)
Admission: RE | Admit: 2020-07-20 | Discharge: 2020-07-20 | Disposition: A | Discharge status: Home / Self Care | Payer: Medicaid Other | Source: Ambulatory Visit | Attending: Internal Medicine | Admitting: Internal Medicine

## 2020-07-20 ENCOUNTER — Telehealth (HOSPITAL_COMMUNITY): Payer: Self-pay | Admitting: Pharmacy Technician

## 2020-07-20 ENCOUNTER — Encounter (HOSPITAL_COMMUNITY): Payer: Self-pay | Admitting: Internal Medicine

## 2020-07-20 ENCOUNTER — Other Ambulatory Visit: Payer: Self-pay

## 2020-07-20 VITALS — BP 92/50 | HR 64 | Wt 131.0 lb

## 2020-07-20 DIAGNOSIS — I428 Other cardiomyopathies: Secondary | ICD-10-CM | POA: Diagnosis not present

## 2020-07-20 DIAGNOSIS — I083 Combined rheumatic disorders of mitral, aortic and tricuspid valves: Secondary | ICD-10-CM | POA: Insufficient documentation

## 2020-07-20 DIAGNOSIS — I110 Hypertensive heart disease with heart failure: Secondary | ICD-10-CM | POA: Insufficient documentation

## 2020-07-20 DIAGNOSIS — Z79899 Other long term (current) drug therapy: Secondary | ICD-10-CM | POA: Insufficient documentation

## 2020-07-20 DIAGNOSIS — I5022 Chronic systolic (congestive) heart failure: Secondary | ICD-10-CM | POA: Diagnosis not present

## 2020-07-20 DIAGNOSIS — I340 Nonrheumatic mitral (valve) insufficiency: Secondary | ICD-10-CM | POA: Diagnosis not present

## 2020-07-20 DIAGNOSIS — Q898 Other specified congenital malformations: Secondary | ICD-10-CM | POA: Diagnosis not present

## 2020-07-20 DIAGNOSIS — Z87891 Personal history of nicotine dependence: Secondary | ICD-10-CM | POA: Diagnosis not present

## 2020-07-20 DIAGNOSIS — I4821 Permanent atrial fibrillation: Secondary | ICD-10-CM

## 2020-07-20 DIAGNOSIS — Z7901 Long term (current) use of anticoagulants: Secondary | ICD-10-CM | POA: Insufficient documentation

## 2020-07-20 LAB — CBC
HCT: 37.2 % — ABNORMAL LOW (ref 39.0–52.0)
Hemoglobin: 12.7 g/dL — ABNORMAL LOW (ref 13.0–17.0)
MCH: 30.4 pg (ref 26.0–34.0)
MCHC: 34.1 g/dL (ref 30.0–36.0)
MCV: 89 fL (ref 80.0–100.0)
Platelets: 155 10*3/uL (ref 150–400)
RBC: 4.18 MIL/uL — ABNORMAL LOW (ref 4.22–5.81)
RDW: 12 % (ref 11.5–15.5)
WBC: 6.1 10*3/uL (ref 4.0–10.5)
nRBC: 0 % (ref 0.0–0.2)

## 2020-07-20 LAB — BASIC METABOLIC PANEL
Anion gap: 9 (ref 5–15)
BUN: 9 mg/dL (ref 6–20)
CO2: 24 mmol/L (ref 22–32)
Calcium: 8.6 mg/dL — ABNORMAL LOW (ref 8.9–10.3)
Chloride: 103 mmol/L (ref 98–111)
Creatinine, Ser: 0.76 mg/dL (ref 0.61–1.24)
GFR, Estimated: >60 mL/min (ref >60)
Glucose, Bld: 95 mg/dL (ref 70–99)
Potassium: 3.3 mmol/L — ABNORMAL LOW (ref 3.5–5.1)
Sodium: 136 mmol/L (ref 135–145)

## 2020-07-20 LAB — BRAIN NATRIURETIC PEPTIDE: B Natriuretic Peptide: 913.2 pg/mL — ABNORMAL HIGH (ref 0.0–100.0)

## 2020-07-20 MED ORDER — DAPAGLIFLOZIN PROPANEDIOL 10 MG PO TABS
10.0000 mg | ORAL_TABLET | Freq: Every day | ORAL | 6 refills | Status: DC
Start: 2020-07-20 — End: 2020-12-24

## 2020-07-20 NOTE — Telephone Encounter (Signed)
Patient Advocate Encounter   Received notification from Medicaid that prior authorization for Gary [name] is required.   PA submitted on [employer] [id number] W Status is pending   Will continue to follow.

## 2020-07-20 NOTE — Addendum Note (Signed)
Encounter addended by: Noralee Space, RN on: 07/20/2020 11:10 AM  Actions taken: Diagnosis association updated, Charge Capture section accepted, Clinical Note Signed, Pharmacy for encounter modified, Order list changed

## 2020-07-20 NOTE — Patient Instructions (Addendum)
Start Farxiga 10 mg Daily  Your provider has prescribed [name] for you. Please be aware the most common side effect of this medication is urinary tract infections and yeast infections. Please practice good hygiene and keep this area clean and dry to help prevent this. If you do begin to have symptoms of these infections, such as difficulty urinating or painful urination,  please let us know.  Labs done today, your results will be available in MyChart, we will contact you for abnormal readings.  Your physician recommends that you schedule a follow-up appointment in: 4 months  If you have any questions or concerns before your next appointment please send [place] a message through [place] or call our office at [phone].    TO LEAVE A MESSAGE FOR THE NURSE SELECT OPTION 2, PLEASE LEAVE A MESSAGE INCLUDING: . YOUR NAME . DATE OF BIRTH . CALL BACK NUMBER . REASON FOR CALL**this is important as we prioritize the call backs  YOU WILL RECEIVE A CALL BACK THE SAME DAY AS LONG AS YOU CALL BEFORE 4:00 PM  At the [facility], you and your health needs are our priority. As part of our continuing mission to provide you with exceptional heart care, we have created designated Provider Care Teams. These Care Teams include your primary Cardiologist (physician) and Advanced Practice Providers (APPs- Physician Assistants and Nurse Practitioners) who all work together to provide you with the care you need, when you need it.   You may see any of the following providers on your designated Care Team at your next follow up: Marland Kitchen Dr Arvilla Meres . Dr Marca Ancona . Tonye Becket, NP . Robbie Lis, PA . Karle Plumber, PharmD   Please be sure to bring in all your medications bottles to every appointment.

## 2020-07-23 ENCOUNTER — Encounter (HOSPITAL_COMMUNITY): Payer: Self-pay

## 2020-07-23 ENCOUNTER — Telehealth (HOSPITAL_COMMUNITY): Payer: Self-pay

## 2020-07-23 DIAGNOSIS — I5042 Chronic combined systolic (congestive) and diastolic (congestive) heart failure: Secondary | ICD-10-CM

## 2020-07-23 MED ORDER — POTASSIUM CHLORIDE CRYS ER 20 MEQ PO TBCR
20.0000 meq | EXTENDED_RELEASE_TABLET | Freq: Every day | ORAL | 3 refills | Status: DC
Start: 2020-07-23 — End: 2021-03-30

## 2020-07-23 NOTE — Telephone Encounter (Signed)
-----   Message from Dolores Patty, MD sent at 07/20/2020  9:17 PM EST ----- Add kcl 20 daily. Take 40 meq on day #1.. repeat bmet in 2 weeks

## 2020-07-23 NOTE — Telephone Encounter (Signed)
Pt aware of lab results and recommendation to start potassium 40 meq today and 20 meq daily starting tomorrow. Verbalized understanding via repeat back. Will rtc on 1/24 for repeat bmet

## 2020-07-29 ENCOUNTER — Ambulatory Visit (HOSPITAL_COMMUNITY)
Admission: EM | Admit: 2020-07-29 | Discharge: 2020-07-29 | Disposition: A | Discharge status: Home / Self Care | Payer: Medicaid Other | Attending: Family Medicine | Admitting: Family Medicine

## 2020-07-29 ENCOUNTER — Encounter (HOSPITAL_COMMUNITY): Payer: Self-pay

## 2020-07-29 DIAGNOSIS — I429 Cardiomyopathy, unspecified: Secondary | ICD-10-CM | POA: Insufficient documentation

## 2020-07-29 DIAGNOSIS — I4891 Unspecified atrial fibrillation: Secondary | ICD-10-CM | POA: Diagnosis not present

## 2020-07-29 DIAGNOSIS — J069 Acute upper respiratory infection, unspecified: Secondary | ICD-10-CM | POA: Diagnosis not present

## 2020-07-29 DIAGNOSIS — Z7984 Long term (current) use of oral hypoglycemic drugs: Secondary | ICD-10-CM | POA: Insufficient documentation

## 2020-07-29 DIAGNOSIS — U071 COVID-19: Secondary | ICD-10-CM | POA: Insufficient documentation

## 2020-07-29 DIAGNOSIS — R059 Cough, unspecified: Secondary | ICD-10-CM | POA: Diagnosis present

## 2020-07-29 DIAGNOSIS — I959 Hypotension, unspecified: Secondary | ICD-10-CM | POA: Insufficient documentation

## 2020-07-29 DIAGNOSIS — Z79899 Other long term (current) drug therapy: Secondary | ICD-10-CM | POA: Insufficient documentation

## 2020-07-29 DIAGNOSIS — Z87891 Personal history of nicotine dependence: Secondary | ICD-10-CM | POA: Diagnosis not present

## 2020-07-29 DIAGNOSIS — Z7901 Long term (current) use of anticoagulants: Secondary | ICD-10-CM | POA: Diagnosis not present

## 2020-07-29 DIAGNOSIS — Z886 Allergy status to analgesic agent status: Secondary | ICD-10-CM | POA: Insufficient documentation

## 2020-07-29 DIAGNOSIS — R001 Bradycardia, unspecified: Secondary | ICD-10-CM | POA: Insufficient documentation

## 2020-07-29 DIAGNOSIS — I509 Heart failure, unspecified: Secondary | ICD-10-CM | POA: Insufficient documentation

## 2020-07-29 LAB — SARS CORONAVIRUS 2 (TAT 6-24 HRS): SARS Coronavirus 2: POSITIVE — AB

## 2020-07-29 MED ORDER — BENZONATATE 100 MG PO CAPS
100.0000 mg | ORAL_CAPSULE | Freq: Three times a day (TID) | ORAL | 0 refills | Status: DC | PRN
Start: 2020-07-29 — End: 2020-12-24

## 2020-07-29 MED ORDER — GUAIFENESIN ER 600 MG PO TB12
600.0000 mg | ORAL_TABLET | Freq: Two times a day (BID) | ORAL | 0 refills | Status: DC | PRN
Start: 2020-07-29 — End: 2021-02-15

## 2020-07-29 NOTE — ED Triage Notes (Signed)
Pt c/o non-productive cough, runny nose for past two days. States daughter at home has URI symptoms. States he had some "chest discomfort that feels like when you have pneumonia, sharp".    Pt states "BP runs low" 2/2 HTN meds and that his cardiologist is aware.  Denies dizziness, lightheadedness, CP at present. EKG performed and given to R. Maurice March

## 2020-07-30 ENCOUNTER — Telehealth: Payer: Self-pay | Admitting: Family

## 2020-07-30 NOTE — Telephone Encounter (Addendum)
Called to discuss with patient about COVID-19 symptoms and the use of one of the available treatments for those with mild to moderate Covid symptoms and at a high risk of hospitalization.  Pt appears to qualify for outpatient treatment due to co-morbid conditions and/or a member of an at-risk group in accordance with the FDA Emergency Use Authorization.    Symptom onset: 07/27/20 Vaccinated:No Booster? Immunocompromised?  Qualifiers: Heart failure, elevated SVI risk   I was unable to get in contact with Gary [name] via phone and unable to leave a voicemail as the mailbox was full. MyChart message was sent. Per chart review appears to be a good candidate for Sotrovimab.   Marcos Eke, NP 07/30/2020 9:19 AM

## 2020-07-30 NOTE — ED Provider Notes (Signed)
[facility]    CSN: [id number] Arrival date & time: 07/29/20  4270      History   Chief Complaint Chief Complaint  Patient presents with  . Cough    HPI Gary [name] is a 45 y.o. male.   Here today with 2 day history of dry cough, runny nose, some sharp chest tightness last night that quickly resolved after a coughing spell. Denies SOB, wheezing, dizziness, syncope, abdominal pain, N/V/D. Not trying anything OTC for sxs. Multiple sick family members at home. Long hx of cardiac issues including atrial fibrillation, HF, cardiomyopathy on numerous medications for these followed closely by Cardiology - states his BP and HR run low at baseline.      Past Medical History:  Diagnosis Date  . Acute on chronic combined systolic and diastolic CHF, NYHA class 3 (HCC) 01/11/2019  . Acute on chronic congestive heart failure (HCC)   . Acute on chronic systolic and diastolic heart failure, NYHA class 1 (HCC) 02/17/2019  . Atrial fibrillation (HCC)   . CHF (congestive heart failure) (HCC)   . Chronic atrial fibrillation (HCC) 10/27/2018  . Chronic systolic heart failure (HCC) 05/16/2019  . Dysrhythmia    hx. of  A-fib  . Former smoker    Quit  . Hyperglycemia 01/11/2019  . Hypertension   . Incarcerated inguinal hernia 10/13/2017  . Internal bleeding    DUE TO ASPIRIN, OR MEDS  . Multifocal pneumonia 01/11/2019  . Murmur   . 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 Protein-calorie malnutrition, severe 10/17/2017  . Severe mitral regurgitation 01/31/2019    Patient Active Problem List   Diagnosis Date Noted  . Chronic systolic heart failure (HCC) 05/16/2019  . Acute on chronic congestive heart failure (HCC)   . Acute on chronic systolic and diastolic heart failure, NYHA class 1 (HCC) 02/17/2019  . Atrial fibrillation (HCC) 01/31/2019  . 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 Severe mitral regurgitation 01/31/2019  . Acute on chronic combined systolic and  diastolic CHF, NYHA class 3 (HCC) 01/11/2019  . Multifocal pneumonia 01/11/2019  . Hyperglycemia 01/11/2019  . Former smoker   . Chronic atrial fibrillation (HCC) 10/27/2018  . Protein-calorie malnutrition, severe 10/17/2017  . Incarcerated inguinal hernia 10/13/2017    Past Surgical History:  Procedure Laterality Date  . ABDOMINAL SURGERY    . ABDOMINAL SURGERY     under 10 yrs. old or younger  . CARDIAC SURGERY    . CARDIOVERSION N/A 08/17/2017   Procedure: CARDIOVERSION;  Surgeon: Orpah Cobb, MD;  Location: [facility] ENDOSCOPY;  Service: Cardiovascular;  Laterality: N/A;  . HERNIA REPAIR    . INGUINAL HERNIA REPAIR Right 10/13/2017   Procedure: OPEN RIGHT INGUINAL HERNIA REPAIR ERAS PATHWAY;  Surgeon: Jimmye Norman, MD;  Location: [facility] OR;  Service: General;  Laterality: Right;  . OPEN HEART SURGERY     AS A CHILD , at 2 yrs. old. done in [place]  . RIGHT/LEFT HEART CATH AND CORONARY ANGIOGRAPHY N/A 02/19/2019   Procedure: RIGHT/LEFT HEART CATH AND CORONARY ANGIOGRAPHY;  Surgeon: Elder Negus, MD;  Location: [facility];  Service: Cardiovascular;  Laterality: N/A;  . TEE WITHOUT CARDIOVERSION N/A 08/17/2017   Procedure: TRANSESOPHAGEAL ECHOCARDIOGRAM (TEE);  Surgeon: Orpah Cobb, MD;  Location: [facility] ENDOSCOPY;  Service: Cardiovascular;  Laterality: N/A;  . TEE WITHOUT CARDIOVERSION N/A 02/19/2019   Procedure: TRANSESOPHAGEAL ECHOCARDIOGRAM (TEE);  Surgeon: Elder Negus, MD;  Location: [facility] ENDOSCOPY;  Service: Cardiovascular;  Laterality: N/A;  Home Medications    Prior to Admission medications   Medication Sig Start Date End Date Taking? Authorizing Provider  apixaban (ELIQUIS) 5 MG TABS tablet Take 1 tablet (5 mg total) by mouth 2 (two) times daily. 02/06/20  Yes Patwardhan, Manish J, MD  benzonatate (TESSALON) 100 MG capsule Take 1 capsule (100 mg total) by mouth 3 (three) times daily as needed for cough. 07/29/20  Yes Particia Nearing, PA-C  dapagliflozin  propanediol (FARXIGA) 10 MG TABS tablet Take 1 tablet (10 mg total) by mouth daily before breakfast. 07/20/20  Yes Bensimhon, Bevelyn Buckles, MD  furosemide (LASIX) 40 MG tablet Take 1 pill every morning. Take additional 1 pill in the afternoon on days with excessive shortness of breath, cough. 08/05/19  Yes Yates Decamp, MD  guaiFENesin (MUCINEX) 600 MG 12 hr tablet Take 1 tablet (600 mg total) by mouth 2 (two) times daily as needed. 07/29/20  Yes Particia Nearing, PA-C  isosorbide-hydrALAZINE (BIDIL) 20-37.5 MG tablet Take 1 tablet by mouth 3 (three) times daily. 03/23/20  Yes Bensimhon, Bevelyn Buckles, MD  metoprolol succinate (TOPROL XL) 25 MG 24 hr tablet Take 1 tablet (25 mg total) by mouth daily. 06/01/20 06/01/21 Yes Patwardhan, Manish J, MD  potassium chloride SA (KLOR-CON) 20 MEQ tablet Take 1 tablet (20 mEq total) by mouth daily. 07/23/20  Yes Laurey Morale, MD  sacubitril-valsartan (ENTRESTO) 24-26 MG Take 1 tablet by mouth 2 (two) times daily. 06/10/20  Yes Patwardhan, Manish J, MD  spironolactone (ALDACTONE) 25 MG tablet Take 0.5 tablets (12.5 mg total) by mouth daily. 03/23/20  Yes Bensimhon, Bevelyn Buckles, MD    Family History Family History  Problem Relation Age of Onset  . Sarcoidosis Mother   . Healthy Father     Social History Social History   Tobacco Use  . Smoking status: Former Smoker    Packs/day: 1.00    Years: 20.00    Pack years: 20.00    Types: Cigarettes    Quit date: 09/09/2018    Years since quitting: 1.8  . Smokeless tobacco: Never Used  Vaping Use  . Vaping Use: Never used  Substance Use Topics  . Alcohol use: Yes    Comment: occ  . Drug use: Not Currently    Types: Marijuana     Allergies   Aspirin   Review of Systems Review of Systems PER HPI    Physical Exam Triage Vital Signs ED Triage Vitals  Enc Vitals Group     BP 07/29/20 1011 (!) 89/40     Pulse Rate 07/29/20 1011 (!) 51     Resp 07/29/20 1011 18     Temp 07/29/20 1011 97.8 F (36.6 C)      Temp Source 07/29/20 1011 Oral     SpO2 07/29/20 1011 100 %     Weight --      Height --      Head Circumference --      Peak Flow --      Pain Score 07/29/20 1007 0     Pain Loc --      Pain Edu? --      Excl. in GC? --    No data found.  Updated Vital Signs BP (!) 81/53 (BP Location: Right Arm)   Pulse (!) 51   Temp 97.8 F (36.6 C) (Oral)   Resp 18   SpO2 100%   Visual Acuity Right Eye Distance:   Left Eye Distance:   Bilateral Distance:  Right Eye Near:   Left Eye Near:    Bilateral Near:     Physical Exam Vitals and nursing note reviewed.  Constitutional:      Appearance: Normal appearance.  HENT:     Head: Atraumatic.     Right Ear: Tympanic membrane normal.     Left Ear: Tympanic membrane normal.     Nose: Rhinorrhea present.     Mouth/Throat:     Mouth: Mucous membranes are moist.     Pharynx: Oropharynx is clear. Posterior oropharyngeal erythema present.  Eyes:     Extraocular Movements: Extraocular movements intact.     Conjunctiva/sclera: Conjunctivae normal.  Cardiovascular:     Rate and Rhythm: Bradycardia present. Rhythm irregular.  Pulmonary:     Effort: Pulmonary effort is normal. No respiratory distress.     Breath sounds: Normal breath sounds. No wheezing or rales.  Abdominal:     General: Bowel sounds are normal. There is no distension.     Palpations: Abdomen is soft.     Tenderness: There is no abdominal tenderness. There is no guarding.  Musculoskeletal:        General: Normal range of motion.     Cervical back: Normal range of motion and neck supple.  Skin:    General: Skin is warm and dry.  Neurological:     General: No focal deficit present.     Mental Status: He is oriented to person, place, and time.  Psychiatric:        Mood and Affect: Mood normal.        Thought Content: Thought content normal.        Judgment: Judgment normal.       UC Treatments / Results  Labs (all labs ordered are listed, but only  abnormal results are displayed) Labs Reviewed  SARS CORONAVIRUS 2 (TAT 6-24 HRS) - Abnormal; Notable for the following components:      Result Value   SARS Coronavirus 2 POSITIVE (*)    All other components within normal limits    EKG   Radiology No results found.  Procedures Procedures (including critical care time)  Medications Ordered in UC Medications - No data to display  Initial Impression / Assessment and Plan / UC Course  I have reviewed the triage vital signs and the nursing notes.  Pertinent labs & imaging results that were available during my care of the patient were reviewed by me and considered in my medical decision making (see chart for details).     EKG done today given his CP last night and his cardiac history - unchanged from past EKGs. Hypotensive and bradycardic today but he states this is baseline for him and he overall feels well. Will test for COVID, treat with tessalon, mucinex. Isolation, supportive care, strict ED precautions reviewed.   Final Clinical Impressions(s) / UC Diagnoses   Final diagnoses:  Viral URI with cough   Discharge Instructions   None    ED Prescriptions    Medication Sig Dispense Auth. Provider   benzonatate (TESSALON) 100 MG capsule Take 1 capsule (100 mg total) by mouth 3 (three) times daily as needed for cough. 21 capsule Particia Nearing, [place]   guaiFENesin (MUCINEX) 600 MG 12 hr tablet Take 1 tablet (600 mg total) by mouth 2 (two) times daily as needed. 30 tablet Particia Nearing, [place]     PDMP not reviewed this encounter.   Roosvelt Maser [place], [place] 07/30/20 [phone]

## 2020-08-03 ENCOUNTER — Ambulatory Visit (HOSPITAL_COMMUNITY)
Admission: RE | Admit: 2020-08-03 | Discharge: 2020-08-03 | Disposition: A | Discharge status: Home / Self Care | Payer: Medicaid Other | Source: Ambulatory Visit | Attending: Cardiology | Admitting: Cardiology

## 2020-08-03 ENCOUNTER — Other Ambulatory Visit: Payer: Self-pay

## 2020-08-03 DIAGNOSIS — I5042 Chronic combined systolic (congestive) and diastolic (congestive) heart failure: Secondary | ICD-10-CM | POA: Diagnosis not present

## 2020-08-03 LAB — BASIC METABOLIC PANEL
Anion gap: 10 (ref 5–15)
BUN: 11 mg/dL (ref 6–20)
CO2: 20 mmol/L — ABNORMAL LOW (ref 22–32)
Calcium: 8.7 mg/dL — ABNORMAL LOW (ref 8.9–10.3)
Chloride: 105 mmol/L (ref 98–111)
Creatinine, Ser: 0.91 mg/dL (ref 0.61–1.24)
GFR, Estimated: >60 mL/min (ref >60)
Glucose, Bld: 138 mg/dL — ABNORMAL HIGH (ref 70–99)
Potassium: 3.8 mmol/L (ref 3.5–5.1)
Sodium: 135 mmol/L (ref 135–145)

## 2020-08-10 ENCOUNTER — Other Ambulatory Visit: Payer: Self-pay

## 2020-08-10 DIAGNOSIS — I5022 Chronic systolic (congestive) heart failure: Secondary | ICD-10-CM

## 2020-08-10 MED ORDER — FUROSEMIDE 40 MG PO TABS: ORAL_TABLET | ORAL | 0 refills | Status: DC

## 2020-08-10 MED ORDER — SPIRONOLACTONE 25 MG PO TABS: 12.5000 mg | ORAL_TABLET | Freq: Every day | ORAL | 0 refills | Status: DC

## 2020-08-10 MED ORDER — ENTRESTO 24-26 MG PO TABS: 1.0000 | ORAL_TABLET | Freq: Two times a day (BID) | ORAL | 0 refills | Status: DC

## 2020-08-19 ENCOUNTER — Ambulatory Visit: Payer: Medicaid Other | Admitting: Cardiology

## 2020-08-19 ENCOUNTER — Other Ambulatory Visit: Payer: Self-pay

## 2020-08-19 ENCOUNTER — Encounter: Payer: Self-pay | Admitting: Cardiology

## 2020-08-19 VITALS — BP 89/40 | HR 51 | Temp 97.5°F | Resp 16 | Ht 64.0 in | Wt 125.0 lb

## 2020-08-19 DIAGNOSIS — I5022 Chronic systolic (congestive) heart failure: Secondary | ICD-10-CM

## 2020-08-19 DIAGNOSIS — I5042 Chronic combined systolic (congestive) and diastolic (congestive) heart failure: Secondary | ICD-10-CM

## 2020-08-19 DIAGNOSIS — I340 Nonrheumatic mitral (valve) insufficiency: Secondary | ICD-10-CM

## 2020-08-19 DIAGNOSIS — I428 Other cardiomyopathies: Secondary | ICD-10-CM

## 2020-08-19 DIAGNOSIS — I4821 Permanent atrial fibrillation: Secondary | ICD-10-CM

## 2020-08-19 NOTE — Progress Notes (Signed)
  Patient referred by [name], [name], FNP for chest pain, shortness of breath  Subjective:   Gary [name], male    DOB: 05/23/1976, 45 y.o.   MRN: [id number]   Chief Complaint  Patient presents with  . Atrial Fibrillation  . Follow-up    6 month  . 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  Patient is doing well. He denies chest pain, shortness of breath, palpitations, leg edema, orthopnea, PND, TIA/syncope. He has regular f/u w/[facility] transplant team, where he undergoes regular cardiopulmonary exercise testing. 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 He had COVID in 07/2020, recovered from it.   He was started on K supplement due to K being 3.3. He is also on sprinolactonr and Entresto.    Current Outpatient Medications on File Prior to Visit  Medication Sig Dispense Refill  . apixaban (ELIQUIS) 5 MG TABS tablet Take 1 tablet (5 mg total) by mouth 2 (two) times daily. 180 tablet 3  . furosemide (LASIX) 40 MG tablet Take 1 pill every morning. Take additional 1 pill in the afternoon on days with excessive shortness of breath, cough. 60 tablet 0  . isosorbide-hydrALAZINE (BIDIL) 20-37.5 MG tablet Take 1 tablet by mouth 3 (three) times daily. 270 tablet 2  . metoprolol succinate (TOPROL XL) 25 MG 24 hr tablet Take 1 tablet (25 mg total) by mouth daily. 90 tablet 3  . potassium chloride SA (KLOR-CON) 20 MEQ tablet Take 1 tablet (20 mEq total) by mouth daily. 90 tablet 3  . sacubitril-valsartan (ENTRESTO) 24-26 MG Take 1 tablet by mouth 2 (two) times daily. 180 tablet 0  . spironolactone (ALDACTONE) 25 MG tablet Take 0.5 tablets (12.5 mg total) by mouth daily. 60 tablet 0  . benzonatate (TESSALON) 100 MG capsule Take 1 capsule (100 mg total) by  mouth 3 (three) times daily as needed for cough. (Patient not taking: Reported on 08/19/2020) 21 capsule 0  . dapagliflozin propanediol (FARXIGA) 10 MG TABS tablet Take 1 tablet (10 mg total) by mouth daily before breakfast. (Patient not taking: Reported on 08/19/2020) 30 tablet 6  . guaiFENesin (MUCINEX) 600 MG 12 hr tablet Take 1 tablet (600 mg total) by mouth 2 (two) times daily as needed. (Patient not taking: Reported on 08/19/2020) 30 tablet 0   No current facility-administered medications on file prior to visit.    Cardiovascular studies:  EKG 08/19/2020: Atrial fibrillation 53 bpm Left ventricular hypertrophy IVCD Inferolateral T wave inversion, consider ischemia  Echocardiogram 03/02/2020:  Left ventricle cavity is moderately dilated. Severe concentric hypertrophy  of the left ventricle. Moderate global and severe anteroseptal  hypokinesis. LVEF 40-45%. Diastolic function not assessed due to atrial  fibrillation and severe mitral regurgitation.   Left atrial cavity is severely dilated.  Trileaflet aortic valve. Moderate to severe aortic regurgitation. Mild  functional aortic stenosis.  Moderate mitral valve leaflet thickening. Severe (Grade IV) mitral  regurgitation. Mild functional mitral stenosis. Mean PG 4 mmHg at HR 56  bpm.  Moderate to severe tricuspid regurgitation. Estimated pulmonary artery  systolic pressure 52 mmHg.  Mild pulmonic regurgitation.  Noncompaction cardiomyopathy. Compared to previous study on 05/06/2019,  LVEF is marginally improved from 35-40%.   cMRI 08/2017: 1. Severely dilated left ventricular size, with normal thickness and severely decreased   systolic function (LVEF =31%). There diffuse hypokinesis and paradoxical septal motion. 2. There is no late gadolinium enhancement in the left ventricular myocardium. 2. Normal right ventricular size, thickness and mildly decreased systolic function (LVEF = 42%). There are no regional wall  motion Abnormalities. 3.There are significant trabeculations in the left ventricular apex with non-compacted to compacted myocardium ratio: 2.6:1. This is consistent with non-compaction cardiomyopathy. 4. Moderately dilated left atrium and mildly dilated right atrial size. 5. Mild to moderate aortic insufficiency. Moderate mitral regurgitation with posteriorly directed jet. Mild tricuspid regurgitation. No significant pulmonary regurgitation is seen.  EKG 01/31/2019: Atrial fibrillation with controlled ventricular response  Voltage criteria for LVH  T wave inversion abnormality  -Possible  Inferior and lateral  ischemia.     Recent labs: 08/03/2020: Glucose 138, BUN/Cr 11/0.91. EGFR >60. Na/K 135/3.8.  H/H 12/37. MCV 89. Platelets 155 BNP 913  12/31/2019: Glucose 94, BUN/Cr 19/1.1. EGFR 81. Na/K 135/3.5. Rest of the CMP normal   Review of Systems  Cardiovascular: Negative for chest pain, dyspnea on exertion, leg swelling, palpitations and syncope.         Objective:      Vitals:   08/22/19 1437  BP: (!) 109/48  Pulse: (!) 56  Resp: 16  Temp: 98.2 F (36.8 C)  SpO2: 98%     Body mass index is 21.11 kg/m. Filed Weights   08/22/19 1437  Weight: 123 lb (55.8 kg)    Physical Exam Vitals and nursing note reviewed.  Constitutional:      General: He is not in acute distress. Neck:     Vascular: No JVD.  Cardiovascular:     Rate and Rhythm: Normal rate and regular rhythm.     Heart sounds: Normal heart sounds. No murmur heard.   Pulmonary:     Effort: Pulmonary effort is normal.     Breath sounds: Normal breath sounds. No wheezing or rales.          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  Noncompaction  cardiomyopathy: EF remains 30-35% on echocardiogram on 05/06/2019. NYHA II. Patient is currently being evaluated for heart transplant and at [facility].   Doing well on Entresto 24-26 mg twice daily, BiDil 1 tab 3 times daily, metoprolol succinate 25 mg daily, spironolactone 12.5 mg daily EF 40-45% (02/2020) On K supplement due to hypokalemia in 07/2020. Will check BMP  Aortic and mitral regurgitation: Both appear moderate. Will conitnue to monitor. Given that EF remains low, he may better benefit from transplant than  mitraclip alone.   Permanent atrial fibrillation: Rate controlled. CHA2DSVasc score 1 with annual  Stroke risk 0.6%. However, given his compaction cardiomyopathy, his stroke risk is higher.  Continue anticoagulation with apixaban 5 mg bid.   Continue f/u w/Dr. Benshimhon and [facility].   F/u w/me in 6 months  Manish J Patwardhan, MD [facility] Pager: [phone] Office: [phone] If no answer Cell [phone]   

## 2020-08-24 NOTE — Telephone Encounter (Signed)
Advanced Heart Failure Patient Advocate Encounter  Prior Authorization for [name] has been approved.    PA# [id number] Effective dates: 07/20/2020 - 07/15/2021

## 2020-08-25 LAB — BASIC METABOLIC PANEL
BUN/Creatinine Ratio: 14 (ref 9–20)
BUN: 13 mg/dL (ref 6–24)
CO2: 19 mmol/L — ABNORMAL LOW (ref 20–29)
Calcium: 8.6 mg/dL — ABNORMAL LOW (ref 8.7–10.2)
Chloride: 103 mmol/L (ref 96–106)
Creatinine, Ser: 0.95 mg/dL (ref 0.76–1.27)
GFR calc Af Amer: 111 mL/min/{1.73_m2} (ref >59)
GFR calc non Af Amer: 96 mL/min/{1.73_m2} (ref >59)
Glucose: 123 mg/dL — ABNORMAL HIGH (ref 65–99)
Potassium: 3.9 mmol/L (ref 3.5–5.2)
Sodium: 139 mmol/L (ref 134–144)

## 2020-08-31 ENCOUNTER — Other Ambulatory Visit: Payer: Self-pay

## 2020-08-31 DIAGNOSIS — I5022 Chronic systolic (congestive) heart failure: Secondary | ICD-10-CM

## 2020-08-31 DIAGNOSIS — I482 Chronic atrial fibrillation, unspecified: Secondary | ICD-10-CM

## 2020-08-31 MED ORDER — METOPROLOL SUCCINATE ER 25 MG PO TB24: 25.0000 mg | ORAL_TABLET | Freq: Every day | ORAL | 6 refills | Status: DC

## 2020-08-31 MED ORDER — ENTRESTO 24-26 MG PO TABS: 1.0000 | ORAL_TABLET | Freq: Two times a day (BID) | ORAL | 6 refills | Status: DC

## 2020-08-31 MED ORDER — APIXABAN 5 MG PO TABS: 5.0000 mg | ORAL_TABLET | Freq: Two times a day (BID) | ORAL | 6 refills | Status: DC

## 2020-09-21 ENCOUNTER — Other Ambulatory Visit: Payer: Self-pay

## 2020-09-21 MED ORDER — ENTRESTO 24-26 MG PO TABS: 1.0000 | ORAL_TABLET | Freq: Two times a day (BID) | ORAL | 6 refills | Status: DC

## 2020-10-01 ENCOUNTER — Other Ambulatory Visit: Payer: Self-pay

## 2020-10-01 DIAGNOSIS — I482 Chronic atrial fibrillation, unspecified: Secondary | ICD-10-CM

## 2020-10-01 DIAGNOSIS — I5022 Chronic systolic (congestive) heart failure: Secondary | ICD-10-CM

## 2020-10-01 MED ORDER — BIDIL 20-37.5 MG PO TABS: 1.0000 | ORAL_TABLET | Freq: Three times a day (TID) | ORAL | 2 refills | Status: DC

## 2020-10-01 MED ORDER — APIXABAN 5 MG PO TABS: 5.0000 mg | ORAL_TABLET | Freq: Two times a day (BID) | ORAL | 6 refills | Status: DC

## 2020-10-01 MED ORDER — METOPROLOL SUCCINATE ER 25 MG PO TB24: 25.0000 mg | ORAL_TABLET | Freq: Every day | ORAL | 6 refills | Status: DC

## 2020-10-01 MED ORDER — ENTRESTO 24-26 MG PO TABS: 1.0000 | ORAL_TABLET | Freq: Two times a day (BID) | ORAL | 6 refills | Status: DC

## 2020-11-02 ENCOUNTER — Other Ambulatory Visit: Payer: Self-pay

## 2020-11-02 DIAGNOSIS — I5022 Chronic systolic (congestive) heart failure: Secondary | ICD-10-CM

## 2020-11-02 MED ORDER — METOPROLOL SUCCINATE ER 25 MG PO TB24: 25.0000 mg | ORAL_TABLET | Freq: Every day | ORAL | 6 refills | Status: DC

## 2020-11-02 MED ORDER — SPIRONOLACTONE 25 MG PO TABS: 12.5000 mg | ORAL_TABLET | Freq: Every day | ORAL | 1 refills | Status: DC

## 2020-11-02 MED ORDER — FUROSEMIDE 40 MG PO TABS: ORAL_TABLET | ORAL | 1 refills | Status: DC

## 2020-11-09 ENCOUNTER — Encounter (HOSPITAL_COMMUNITY): Payer: Medicaid Other | Admitting: Internal Medicine

## 2020-11-30 ENCOUNTER — Other Ambulatory Visit: Payer: Self-pay

## 2020-11-30 DIAGNOSIS — I482 Chronic atrial fibrillation, unspecified: Secondary | ICD-10-CM

## 2020-11-30 DIAGNOSIS — I5022 Chronic systolic (congestive) heart failure: Secondary | ICD-10-CM

## 2020-11-30 MED ORDER — BIDIL 20-37.5 MG PO TABS: 1.0000 | ORAL_TABLET | Freq: Three times a day (TID) | ORAL | 2 refills | Status: DC

## 2020-11-30 MED ORDER — METOPROLOL SUCCINATE ER 25 MG PO TB24: 25.0000 mg | ORAL_TABLET | Freq: Every day | ORAL | 6 refills | Status: DC

## 2020-11-30 MED ORDER — ELIQUIS 5 MG PO TABS: 5.0000 mg | ORAL_TABLET | Freq: Two times a day (BID) | ORAL | 6 refills | Status: DC

## 2020-12-22 ENCOUNTER — Other Ambulatory Visit: Payer: Self-pay

## 2020-12-22 DIAGNOSIS — I482 Chronic atrial fibrillation, unspecified: Secondary | ICD-10-CM

## 2020-12-22 DIAGNOSIS — I5022 Chronic systolic (congestive) heart failure: Secondary | ICD-10-CM

## 2020-12-22 MED ORDER — METOPROLOL SUCCINATE ER 25 MG PO TB24: 25.0000 mg | ORAL_TABLET | Freq: Every day | ORAL | 6 refills | Status: DC

## 2020-12-22 MED ORDER — ISOSORB DINITRATE-HYDRALAZINE 20-37.5 MG PO TABS: 1.0000 | ORAL_TABLET | Freq: Three times a day (TID) | ORAL | 6 refills | Status: DC

## 2020-12-22 MED ORDER — ELIQUIS 5 MG PO TABS: 5.0000 mg | ORAL_TABLET | Freq: Two times a day (BID) | ORAL | 6 refills | Status: DC

## 2020-12-24 ENCOUNTER — Other Ambulatory Visit: Payer: Self-pay

## 2020-12-24 ENCOUNTER — Ambulatory Visit (HOSPITAL_COMMUNITY)
Admission: RE | Admit: 2020-12-24 | Discharge: 2020-12-24 | Disposition: A | Discharge status: Home / Self Care | Payer: Medicaid Other | Source: Ambulatory Visit | Attending: Internal Medicine | Admitting: Internal Medicine

## 2020-12-24 ENCOUNTER — Encounter (HOSPITAL_COMMUNITY): Payer: Self-pay | Admitting: Internal Medicine

## 2020-12-24 VITALS — BP 90/50 | HR 55 | Wt 120.6 lb

## 2020-12-24 DIAGNOSIS — I428 Other cardiomyopathies: Secondary | ICD-10-CM | POA: Diagnosis not present

## 2020-12-24 DIAGNOSIS — Z87891 Personal history of nicotine dependence: Secondary | ICD-10-CM | POA: Diagnosis not present

## 2020-12-24 DIAGNOSIS — I082 Rheumatic disorders of both aortic and tricuspid valves: Secondary | ICD-10-CM | POA: Diagnosis not present

## 2020-12-24 DIAGNOSIS — R0602 Shortness of breath: Secondary | ICD-10-CM | POA: Insufficient documentation

## 2020-12-24 DIAGNOSIS — I110 Hypertensive heart disease with heart failure: Secondary | ICD-10-CM | POA: Diagnosis not present

## 2020-12-24 DIAGNOSIS — I340 Nonrheumatic mitral (valve) insufficiency: Secondary | ICD-10-CM | POA: Diagnosis not present

## 2020-12-24 DIAGNOSIS — Z7901 Long term (current) use of anticoagulants: Secondary | ICD-10-CM | POA: Insufficient documentation

## 2020-12-24 DIAGNOSIS — I5042 Chronic combined systolic (congestive) and diastolic (congestive) heart failure: Secondary | ICD-10-CM | POA: Diagnosis not present

## 2020-12-24 DIAGNOSIS — I4821 Permanent atrial fibrillation: Secondary | ICD-10-CM | POA: Diagnosis not present

## 2020-12-24 DIAGNOSIS — Z79899 Other long term (current) drug therapy: Secondary | ICD-10-CM | POA: Diagnosis not present

## 2020-12-24 LAB — BASIC METABOLIC PANEL
Anion gap: 9 (ref 5–15)
BUN: 14 mg/dL (ref 6–20)
CO2: 19 mmol/L — ABNORMAL LOW (ref 22–32)
Calcium: 8.5 mg/dL — ABNORMAL LOW (ref 8.9–10.3)
Chloride: 104 mmol/L (ref 98–111)
Creatinine, Ser: 0.86 mg/dL (ref 0.61–1.24)
GFR, Estimated: >60 mL/min (ref >60)
Glucose, Bld: 101 mg/dL — ABNORMAL HIGH (ref 70–99)
Potassium: 3.8 mmol/L (ref 3.5–5.1)
Sodium: 132 mmol/L — ABNORMAL LOW (ref 135–145)

## 2020-12-24 LAB — CBC
HCT: 37.2 % — ABNORMAL LOW (ref 39.0–52.0)
Hemoglobin: 12.3 g/dL — ABNORMAL LOW (ref 13.0–17.0)
MCH: 30.1 pg (ref 26.0–34.0)
MCHC: 33.1 g/dL (ref 30.0–36.0)
MCV: 91.2 fL (ref 80.0–100.0)
Platelets: 170 10*3/uL (ref 150–400)
RBC: 4.08 MIL/uL — ABNORMAL LOW (ref 4.22–5.81)
RDW: 12.3 % (ref 11.5–15.5)
WBC: 5.4 10*3/uL (ref 4.0–10.5)
nRBC: 0 % (ref 0.0–0.2)

## 2020-12-24 NOTE — Progress Notes (Addendum)
ADVANCED HF CLINIC NOTE  PCP: Diamantina Providence, FNP Primary Cardiologist: Dr. Rosemary Holms  HPI:  Gary [name] is a 45 y.o. 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  Echo 05/06/19 confirmed EF 25-30% with severe MR and mild to moderate AI. RVEF moderately decreased.  CPX test 9/20 with severe HF limitation. Repeat CPX test at [facility] was improved  Echo 03/03/20 with Dr. Rosemary Holms EF read as 40-45% Moderate-severe AI, severe MR, mod-severe TR. Saw Dr Ladona Ridgel. No ICD based on EF improvement.   CPX 10/21 Peak VO2: 21.1 (56% predicted peak VO2) slope:  41  Last visit 07/2020 patient stable, chronic low bp but asymptomatic, volume status was good.  [name] started however at follow up at [facility] was no longer taking.    Last seen by Dr. Edwena Blow at [facility] 10/2020.  Deferred CT with contrast until actual listing.  Given severe MR requests reevaluating PA pressures.    Today feeling pretty good.  Working at crafted every day, not getting short of breath.  Not really exercising, rode a bike the other day and felt good doing it, was a little short of breath initially but then did well on the return trip.  Still having trouble going up flights of stairs and up steep hills, he gets short of breath with this.  Got drowsy with the farxiga. Using the lasix PRN about 15 days a week usually due to abdominal girth.    Cardiac studies:  cMRI 2/19 1. Severely dilated left ventricular size, with normal thickness and severely decreased systolic function (LVEF =31%). There diffuse hypokinesis and paradoxical septal motion.There is no late gadolinium enhancement in the left ventricular myocardium. 2. Normal right ventricular size, thickness and mildly decreasedsystolic function (LVEF = 42%).  3.There are significant trabeculations in the left  ventricular apex with non-compacted to compacted myocardium ratio: 2.6:1. This is consistent with non-compaction cardiomyopathy. 4. Moderately dilated left atrium and mildly dilated right  5. Mild to moderate aortic insufficiency. Moderate mitral regurgitation with posteriorly directed jet. Mild tricuspid regurgitation. No significant pulmonary regurgitation is seen.  -Echo 8/20 EF 25-30% with severe non-compaction. Severe MR with severe LAE  - TEE 8/20 EF 25-30% moderately reduced RV function severe MR, mod TR  R/L cath normal coronaries RA =  3 RV =  45/2 PA  = 45/12 (28) PCWP = 18 with large v-waves Fick = 4.0/2.6  Repeat CPX at [facility] 3/21 pVO2 19.7 (45%) VeVCO2 31 RER 1.36  CPX 9/20  FVC 2.28 (62%)      FEV1 1.79 (60%)        FEV1/FVC 79 (96%)        MVV 72(50%)   Resting HR: 73 Standing HR: 76 Peak HR: 110   (63% age predicted max HR)  BP rest: 102/60 Standing HR: 108/60 BP peak: 124/62  Peak VO2: 15.7 (41% predicted peak VO2)  VE/VCO2 slope:  55  OUES: 0.83  Peak RER: 1.11  Ventilatory Threshold: 13.6 (36% predicted or 87% measured peak VO2)  VE/MVV:  60%  O2pulse:  7   (64% predicted O2pulse)   Had echo at [facility] in 9/20 an EF read as 50-55%.    ROS: All systems negative except as listed in HPI, PMH and Problem List.  SH:  Social History   Socioeconomic History   Marital status: Single  Spouse name: Not on file   Number of children: 8   Years of education: Not on file   Highest education level: Not on file  Occupational History   Not on file  Tobacco Use   Smoking status: Former    Packs/day: 1.00    Years: 20.00    Pack years: 20.00    Types: Cigarettes    Quit date: 09/09/2018    Years since quitting: 2.2   Smokeless tobacco: Never  Vaping Use   Vaping Use: Never used  Substance and Sexual Activity   Alcohol use: Yes    Comment: occ   Drug use: Not Currently    Types: Marijuana   Sexual activity: Not on file  Other Topics Concern   Not on  file  Social History Narrative   Not on file   Social Determinants of Health   Financial Resource Strain: Not on file  Food Insecurity: Not on file  Transportation Needs: Not on file  Physical Activity: Not on file  Stress: Not on file  Social Connections: Not on file  Intimate Partner Violence: Not on file    FH:  Family History  Problem Relation Age of Onset   Sarcoidosis Mother    Healthy Father     Past Medical History:  Diagnosis Date   Acute on chronic combined systolic and diastolic CHF, NYHA class 3 (HCC) 01/11/2019   Acute on chronic congestive heart failure (HCC)    Acute on chronic systolic and diastolic heart failure, NYHA class 1 (HCC) 02/17/2019   Atrial fibrillation (HCC)    CHF (congestive heart failure) (HCC)    Chronic atrial fibrillation (HCC) 10/27/2018   Chronic systolic heart failure (HCC) 05/16/2019   Dysrhythmia    hx. 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 60 tablet 6   furosemide (LASIX) 40 MG tablet Take 1 pill every morning. Take additional 1 pill in the afternoon on days with excessive shortness of breath, cough. 60 tablet 1   guaiFENesin (MUCINEX) 600 MG 12 hr tablet Take 1 tablet (600 mg total) by mouth 2 (two) times daily as needed. 30 tablet 0   isosorbide-hydrALAZINE (BIDIL) 20-37.5 MG tablet Take 1 tablet by mouth 3 (three) times daily. 270 tablet 6   metoprolol succinate (TOPROL XL) 25 MG 24 hr tablet Take 1 tablet (25 mg total) by mouth daily. 30 tablet 6   potassium chloride SA  (KLOR-CON) 20 MEQ tablet Take 1 tablet (20 mEq total) by mouth daily. 90 tablet 3   sacubitril-valsartan (ENTRESTO) 24-26 MG Take 1 tablet by mouth 2 (two) times daily. 60 tablet 6   spironolactone (ALDACTONE) 25 MG tablet Take 0.5 tablets (12.5 mg total) by mouth daily. 60 tablet 1   No current facility-administered medications for this encounter.    Vitals:   12/24/20 1337  BP: (!) 90/50  Pulse: (!) 55  SpO2: 96%  Weight: 54.7 kg (120 lb 9.6 oz)    PHYSICAL EXAM:  General:  Thin Well appearing. No resp difficulty HEENT: normal Neck: supple. no JVD. Carotids 2+ bilat; no bruits. No lymphadenopathy or thryomegaly appreciated. Cor: PMI nondisplaced. Irregular rate & rhythm. 3/6 MR Lungs: clear Abdomen: soft, nontender, nondistended. No hepatosplenomegaly. No bruits or masses. Good bowel sounds. Extremities: no cyanosis, clubbing, rash, edema Neuro: alert & orientedx3, cranial nerves grossly intact. moves all 4 extremities w/o difficulty. Affect pleasant  ASSESSMENT & PLAN:  1. 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 No LGE - Cath 8/20 normal cors - Echo 8/20 EF 25-30% severe MR. Severe LAE - TEE 8/20 EF 25-30% severe MR. Moderately reduced RV function - Echo 05/06/19 EF 25-30% with severe MR and mild to moderate AI. RVEF moderately decreased.  - CPX in 9/20 with severe limitation pVO2: 15.7 (41% predicted peak VO2) slope 55 - Repeat CP[facility] at [facility] 3/21 improved. pVO2 19.7 (45%) VeVCO2 31 - CPX 10/21 Peak VO2: 21.1 (56% predicted peak VO2) slope:  41  - Echo 03/03/20 with Dr. Rosemary Holms EF read as 40-45% Moderate-severe AI, severe MR, mod-severe TR. Saw Dr Ladona Ridgel. No ICD based on EF improvement.  - stable NYHA II-III - Volume status looks good. BP soft but stable - Off digoxin due to severe bradycardia in 40s - Blood type O-pos - Continue Entresto 24/26 bid - Continue Bidil 1 tab tid - Continue Toprol XL 12.5 mg daily - Continue  spiro 12.5 mg daily - Will retry Farxiga 5mg  QHS and see if he can tolerate, instructed to use lasix less - Will arrange for RHC 01/04/21  2. Permanent AF - Toerating rate control well.  - Continue apixaban. No bleeding - likely not candidate for RFA or DC-CV given severe LAE - rate somewhat improved after stopping digoxin  3. Severe MR - TEE also suggests mild to moderate MS so may not be candidate for Clip if not transplant candidate. 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 Tobacco use, ongoing - has quit since 3/20. No change.   4/20, MD  1:43 PM  Patient seen and examined with the above-signed Advanced Practice Provider and/or Housestaff. I personally reviewed laboratory data, imaging studies and relevant notes. I independently examined the patient and formulated the important aspects of the plan. I have edited the note to reflect any of my changes or salient points. I have personally discussed the plan with the patient and/or family.  Stable NYHA II-early III. Volume status looks good. He has virtual visit with [facility] and felt to be stable. They want to get RHC to make sure PA pressures aren't overly elevated. Will arrange.   General:  Well appearing. No resp difficulty HEENT: normal Neck: supple. no JVD. Carotids 2+ bilat; no bruits. No lymphadenopathy or thryomegaly appreciated. Cor: PMI nondisplaced. Irregular rate & rhythm. 3/6 MR Lungs: clear Abdomen: soft, nontender, nondistended. No hepatosplenomegaly. No bruits or masses. Good bowel sounds. Extremities: no cyanosis, clubbing, rash, edema Neuro: alert & orientedx3, cranial nerves grossly intact. moves all 4 extremities w/o difficulty. Affect pleasant  Restart Farxiga. Plan RHC. Continue w/u for advanced therapies.   Gary Ingles, MD  10:54 PM

## 2021-01-01 ENCOUNTER — Telehealth (HOSPITAL_COMMUNITY): Payer: Self-pay | Admitting: Vascular Surgery

## 2021-01-01 NOTE — Telephone Encounter (Signed)
Patient rescheduled for 8/1 at 1:30pn, left detailed message on patients vm

## 2021-01-01 NOTE — Telephone Encounter (Signed)
Pt left VM  on scheduling line to reschedule cath on Monday ..please advise

## 2021-01-04 ENCOUNTER — Telehealth (HOSPITAL_COMMUNITY): Payer: Self-pay | Admitting: Internal Medicine

## 2021-01-04 NOTE — Telephone Encounter (Signed)
See telephone message from 6/24 patient was rescheduled to 8/1 at 1:30pm

## 2021-01-04 NOTE — Telephone Encounter (Signed)
Pt called in regards to r/s heart cath, please advise

## 2021-01-28 ENCOUNTER — Other Ambulatory Visit: Payer: Self-pay

## 2021-01-28 DIAGNOSIS — I5022 Chronic systolic (congestive) heart failure: Secondary | ICD-10-CM

## 2021-01-28 MED ORDER — METOPROLOL SUCCINATE ER 25 MG PO TB24: 25.0000 mg | ORAL_TABLET | Freq: Every day | ORAL | 1 refills | Status: DC

## 2021-01-28 MED ORDER — ENTRESTO 24-26 MG PO TABS: 1.0000 | ORAL_TABLET | Freq: Two times a day (BID) | ORAL | 6 refills | Status: DC

## 2021-01-28 MED ORDER — ISOSORB DINITRATE-HYDRALAZINE 20-37.5 MG PO TABS: 1.0000 | ORAL_TABLET | Freq: Three times a day (TID) | ORAL | 1 refills | Status: DC

## 2021-02-01 ENCOUNTER — Other Ambulatory Visit: Payer: Self-pay

## 2021-02-02 ENCOUNTER — Other Ambulatory Visit: Payer: Self-pay

## 2021-02-02 DIAGNOSIS — I5022 Chronic systolic (congestive) heart failure: Secondary | ICD-10-CM

## 2021-02-02 MED ORDER — BIDIL 20-37.5 MG PO TABS: 1.0000 | ORAL_TABLET | Freq: Three times a day (TID) | ORAL | 3 refills | Status: DC

## 2021-02-02 NOTE — Telephone Encounter (Signed)
-----   Message from Rayford Halsted, [place] sent at 02/01/2021 12:18 PM EDT ----- Regarding: Bidil Patient called over the weekend and said he was having trouble filling his Bidil prescription. Sounded like maybe insurance was not covering it. Will you please look into this and address accordingly. Looks like [facility] recently send refill on 01/28/2021.

## 2021-02-05 ENCOUNTER — Telehealth (HOSPITAL_COMMUNITY): Payer: Self-pay | Admitting: Vascular Surgery

## 2021-02-05 NOTE — Telephone Encounter (Signed)
Pt called to cancel Cath in 02/08/21 @ 1:30, PT would like to reschedule when Heather and DB come back from vacation .. please advise

## 2021-02-08 ENCOUNTER — Encounter (HOSPITAL_COMMUNITY): Admission: RE | Payer: Self-pay | Source: Ambulatory Visit

## 2021-02-08 ENCOUNTER — Ambulatory Visit (HOSPITAL_COMMUNITY): Admission: RE | Admit: 2021-02-08 | Payer: Medicaid Other | Source: Ambulatory Visit | Admitting: Internal Medicine

## 2021-02-08 SURGERY — RIGHT HEART CATH
Anesthesia: LOCAL

## 2021-02-14 NOTE — Progress Notes (Signed)
Patient referred by [name], FNP for chest pain, shortness of breath  Subjective:   Gary [name], male    DOB: [birth year], 45 y.o.   MRN: [id number]   Chief Complaint  Patient presents with   Chronic combined systolic and diastolic heart failure    HPI  45 y.o. African-American male with h/o congenital heart disease (chest surgery at age 45 in [place] for suspected ASD closure), heterotaxy, chronic systolic HF due to noncompaction with 25-30% in 2/20, permanent AF.  Patient is doing well. He denies chest pain, shortness of breath, palpitations, leg edema, orthopnea, PND, TIA/syncope. Barring occasional bloating sensation after drinking soda, he does not have any specific complaints today.   He recently had a virtual visit with [facility] transplant team. Based on this visit, Dr. Haroldine Laws had recommended RHC, which has not yet happened due to scheduling and child care issues.   Current Outpatient Medications on File Prior to Visit  Medication Sig Dispense Refill   apixaban (ELIQUIS) 5 MG TABS tablet Take 1 tablet (5 mg total) by mouth 2 (two) times daily. 60 tablet 6   BIDIL 20-37.5 MG tablet Take 1 tablet by mouth 3 (three) times daily. 270 tablet 3   furosemide (LASIX) 40 MG tablet Take 1 pill every morning. Take additional 1 pill in the afternoon on days with excessive shortness of breath, cough. (Patient taking differently: Take 40 mg by mouth at bedtime. additional 1 pill in the afternoon on days with excessive shortness of breath, cough.) 60 tablet 1   guaiFENesin (MUCINEX) 600 MG 12 hr tablet Take 1 tablet (600 mg total) by mouth 2 (two) times daily as needed. 30 tablet 0   metoprolol succinate (TOPROL XL) 25 MG 24 hr tablet Take 1 tablet (25 mg total) by mouth daily. 90 tablet 1   potassium chloride SA (KLOR-CON) 20 MEQ tablet Take 1 tablet (20 mEq total) by mouth daily. (Patient taking differently: Take 20 mEq by mouth 4 (four) times a week.) 90 tablet 3    sacubitril-valsartan (ENTRESTO) 24-26 MG Take 1 tablet by mouth 2 (two) times daily. 60 tablet 6   spironolactone (ALDACTONE) 25 MG tablet Take 0.5 tablets (12.5 mg total) by mouth daily. 60 tablet 1   No current facility-administered medications on file prior to visit.    Cardiovascular studies:  EKG 02/15/2021: Atrial fibrillation 69 bpm Left ventricular hypertrophy IVCD Occasional PVC Lateral T wave inversion, consider ishcemia  Echocardiogram 03/02/2020:  Left ventricle cavity is moderately dilated. Severe concentric hypertrophy  of the left ventricle. Moderate global and severe anteroseptal  hypokinesis. LVEF 40-45%.  Diastolic function not assessed due to atrial  fibrillation and severe mitral regurgitation.    Left atrial cavity is severely dilated.  Trileaflet aortic valve.  Moderate to severe aortic regurgitation. Mild  functional aortic stenosis.  Moderate mitral valve leaflet thickening. Severe (Grade IV) mitral  regurgitation. Mild functional mitral stenosis. Mean PG 4 mmHg at HR 56  bpm.  Moderate to severe tricuspid regurgitation. Estimated pulmonary artery  systolic pressure 52 mmHg.  Mild pulmonic regurgitation.  Noncompaction cardiomyopathy. Compared to previous study on 05/06/2019,  LVEF is marginally improved from 35-40%.    cMRI 08/2017: 1. Severely dilated left ventricular size, with normal thickness and severely decreased systolic function (LVEF =89%). There diffuse hypokinesis and paradoxical septal motion. 2. There is no late gadolinium enhancement in the left ventricular myocardium. 2. Normal right ventricular size, thickness and mildly decreased systolic function (LVEF = 42%). There  are no regional wall motion Abnormalities. 3.There are significant trabeculations in the left ventricular apex with non-compacted to compacted myocardium ratio: 2.6:1. This is consistent with non-compaction cardiomyopathy. 4. Moderately dilated left atrium and mildly  dilated right atrial size. 5. Mild to moderate aortic insufficiency. Moderate mitral regurgitation with posteriorly directed jet. Mild tricuspid regurgitation. No significant pulmonary regurgitation is seen.   EKG 01/31/2019: Atrial fibrillation with controlled ventricular response  Voltage criteria for LVH  T wave inversion abnormality  -Possible  Inferior and lateral  ischemia.     Recent labs: 08/03/2020: Glucose 138, BUN/Cr 11/0.91. EGFR >60. Na/K 135/3.8.  H/H 12/37. MCV 89. Platelets 155 BNP 913  12/31/2019: Glucose 94, BUN/Cr 19/1.1. EGFR 81. Na/K 135/3.5. Rest of the CMP normal   Review of Systems  Cardiovascular:  Negative for chest pain, dyspnea on exertion, leg swelling, palpitations and syncope.        Objective:     Today's Vitals   02/15/21 1014  BP: (!) 91/51  Pulse: 74  Temp: 98.2 F (36.8 C)  SpO2: 97%  Weight: 122 lb (55.3 kg)  Height: '5\' 4"'  (1.626 m)   Body mass index is 20.94 kg/m.   Filed Weights   02/15/21 1014  Weight: 122 lb (55.3 kg)    Physical Exam Vitals and nursing note reviewed.  Constitutional:      General: He is not in acute distress. Neck:     Vascular: No JVD.  Cardiovascular:     Rate and Rhythm: Normal rate and regular rhythm.     Heart sounds: Normal heart sounds. No murmur heard. Pulmonary:     Effort: Pulmonary effort is normal.     Breath sounds: Normal breath sounds. No wheezing or rales.  Musculoskeletal:     Right lower leg: No edema.     Left lower leg: No edema.         Assessment & Recommendations:   45 y.o. 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  Noncompaction cardiomyopathy: NYHA I-II. BP soft but at baseline without any complaints. Patient is currently being evaluated for heart transplant and at [facility].    Doing well on Entresto 24-26 mg twice daily, BiDil 1 tab 3 times daily, metoprolol succinate 25 mg daily, spironolactone 12.5 mg daily EF 40-45% (02/2020)  Defer to Dr Haroldine Laws re: [facility] pending scheduling  Aortic and mitral regurgitation: Moderate, clinically stable  Permanent atrial fibrillation: Rate controlled. CHA2DSVasc score 1 with annual  Stroke risk 0.6%. However, given his compaction cardiomyopathy, his stroke risk is higher.  Continue anticoagulation with apixaban 5 mg bid.   Continue f/u w/Dr. Kendrick Ranch and [facility].   F/u w/me in 1 year   [name], MD [facility] Pager: [phone] Office: [phone] If no answer Cell [phone]

## 2021-02-15 ENCOUNTER — Encounter (HOSPITAL_COMMUNITY): Payer: Self-pay | Admitting: *Deleted

## 2021-02-15 ENCOUNTER — Other Ambulatory Visit: Payer: Self-pay

## 2021-02-15 ENCOUNTER — Encounter: Payer: Self-pay | Admitting: Cardiology

## 2021-02-15 ENCOUNTER — Telehealth (HOSPITAL_COMMUNITY): Payer: Self-pay | Admitting: Vascular Surgery

## 2021-02-15 ENCOUNTER — Ambulatory Visit: Payer: Medicaid Other | Admitting: Cardiology

## 2021-02-15 VITALS — BP 91/51 | HR 74 | Temp 98.2°F | Ht 64.0 in | Wt 122.0 lb

## 2021-02-15 DIAGNOSIS — I4821 Permanent atrial fibrillation: Secondary | ICD-10-CM

## 2021-02-15 DIAGNOSIS — I5022 Chronic systolic (congestive) heart failure: Secondary | ICD-10-CM

## 2021-02-15 NOTE — Telephone Encounter (Signed)
Pt called to get heart cath rescheduled ..please advise

## 2021-02-15 NOTE — Telephone Encounter (Signed)
Cath rescheduled pt aware. Per pt send cath instruction letter via mychart.

## 2021-02-17 ENCOUNTER — Other Ambulatory Visit (HOSPITAL_COMMUNITY): Payer: Self-pay | Admitting: Internal Medicine

## 2021-02-17 DIAGNOSIS — I5022 Chronic systolic (congestive) heart failure: Secondary | ICD-10-CM

## 2021-02-22 ENCOUNTER — Other Ambulatory Visit: Payer: Self-pay

## 2021-02-22 ENCOUNTER — Ambulatory Visit (HOSPITAL_COMMUNITY)
Admission: RE | Admit: 2021-02-22 | Discharge: 2021-02-22 | Disposition: A | Discharge status: Home / Self Care | Payer: Medicaid Other | Source: Ambulatory Visit | Attending: Internal Medicine | Admitting: Internal Medicine

## 2021-02-22 ENCOUNTER — Encounter (HOSPITAL_COMMUNITY)
Admission: RE | Disposition: A | Discharge status: Home / Self Care | Payer: Self-pay | Source: Ambulatory Visit | Attending: Internal Medicine

## 2021-02-22 ENCOUNTER — Encounter (HOSPITAL_COMMUNITY): Payer: Self-pay | Admitting: Internal Medicine

## 2021-02-22 DIAGNOSIS — I5022 Chronic systolic (congestive) heart failure: Secondary | ICD-10-CM | POA: Diagnosis not present

## 2021-02-22 DIAGNOSIS — I5042 Chronic combined systolic (congestive) and diastolic (congestive) heart failure: Secondary | ICD-10-CM | POA: Insufficient documentation

## 2021-02-22 DIAGNOSIS — I4821 Permanent atrial fibrillation: Secondary | ICD-10-CM | POA: Diagnosis not present

## 2021-02-22 DIAGNOSIS — Z87891 Personal history of nicotine dependence: Secondary | ICD-10-CM | POA: Insufficient documentation

## 2021-02-22 DIAGNOSIS — Z7901 Long term (current) use of anticoagulants: Secondary | ICD-10-CM | POA: Diagnosis not present

## 2021-02-22 DIAGNOSIS — I428 Other cardiomyopathies: Secondary | ICD-10-CM | POA: Diagnosis not present

## 2021-02-22 DIAGNOSIS — I110 Hypertensive heart disease with heart failure: Secondary | ICD-10-CM | POA: Diagnosis not present

## 2021-02-22 DIAGNOSIS — Z79899 Other long term (current) drug therapy: Secondary | ICD-10-CM | POA: Diagnosis not present

## 2021-02-22 DIAGNOSIS — I340 Nonrheumatic mitral (valve) insufficiency: Secondary | ICD-10-CM | POA: Diagnosis not present

## 2021-02-22 HISTORY — PX: RIGHT HEART CATH: CATH118263

## 2021-02-22 LAB — POCT I-STAT EG7
Acid-Base Excess: 0 mmol/L (ref 0.0–2.0)
Acid-Base Excess: 0 mmol/L (ref 0.0–2.0)
Acid-Base Excess: 1 mmol/L (ref 0.0–2.0)
Acid-Base Excess: 0 mmol/L (ref 0.0–2.0)
Bicarbonate: 25.3 mmol/L (ref 20.0–28.0)
Bicarbonate: 25.3 mmol/L (ref 20.0–28.0)
Bicarbonate: 26.3 mmol/L (ref 20.0–28.0)
Bicarbonate: 25.4 mmol/L (ref 20.0–28.0)
Calcium, Ion: 1.09 mmol/L — ABNORMAL LOW (ref 1.15–1.40)
Calcium, Ion: 1.2 mmol/L (ref 1.15–1.40)
Calcium, Ion: 1.22 mmol/L (ref 1.15–1.40)
Calcium, Ion: 1.2 mmol/L (ref 1.15–1.40)
HCT: 35 % — ABNORMAL LOW (ref 39.0–52.0)
HCT: 36 % — ABNORMAL LOW (ref 39.0–52.0)
HCT: 37 % — ABNORMAL LOW (ref 39.0–52.0)
HCT: 36 % — ABNORMAL LOW (ref 39.0–52.0)
Hemoglobin: 11.9 g/dL — ABNORMAL LOW (ref 13.0–17.0)
Hemoglobin: 12.2 g/dL — ABNORMAL LOW (ref 13.0–17.0)
Hemoglobin: 12.6 g/dL — ABNORMAL LOW (ref 13.0–17.0)
Hemoglobin: 12.2 g/dL — ABNORMAL LOW (ref 13.0–17.0)
O2 Saturation: 59 %
O2 Saturation: 67 %
O2 Saturation: 68 %
O2 Saturation: 75 %
Potassium: 3.5 mmol/L (ref 3.5–5.1)
Potassium: 3.7 mmol/L (ref 3.5–5.1)
Potassium: 3.7 mmol/L (ref 3.5–5.1)
Potassium: 3.7 mmol/L (ref 3.5–5.1)
Sodium: 139 mmol/L (ref 135–145)
Sodium: 140 mmol/L (ref 135–145)
Sodium: 142 mmol/L (ref 135–145)
Sodium: 139 mmol/L (ref 135–145)
TCO2: 27 mmol/L (ref 22–32)
TCO2: 27 mmol/L (ref 22–32)
TCO2: 28 mmol/L (ref 22–32)
TCO2: 27 mmol/L (ref 22–32)
pCO2, Ven: 42.2 mmHg — ABNORMAL LOW (ref 44.0–60.0)
pCO2, Ven: 42.6 mmHg — ABNORMAL LOW (ref 44.0–60.0)
pCO2, Ven: 43.3 mmHg — ABNORMAL LOW (ref 44.0–60.0)
pCO2, Ven: 43.3 mmHg — ABNORMAL LOW (ref 44.0–60.0)
pH, Ven: 7.382 (ref 7.250–7.430)
pH, Ven: 7.385 (ref 7.250–7.430)
pH, Ven: 7.391 (ref 7.250–7.430)
pH, Ven: 7.376 (ref 7.250–7.430)
pO2, Ven: 32 mmHg (ref 32.0–45.0)
pO2, Ven: 35 mmHg (ref 32.0–45.0)
pO2, Ven: 36 mmHg (ref 32.0–45.0)
pO2, Ven: 41 mmHg (ref 32.0–45.0)

## 2021-02-22 LAB — CBC
HCT: 36 % — ABNORMAL LOW (ref 39.0–52.0)
Hemoglobin: 12.6 g/dL — ABNORMAL LOW (ref 13.0–17.0)
MCH: 31.4 pg (ref 26.0–34.0)
MCHC: 35 g/dL (ref 30.0–36.0)
MCV: 89.8 fL (ref 80.0–100.0)
Platelets: 160 10*3/uL (ref 150–400)
RBC: 4.01 MIL/uL — ABNORMAL LOW (ref 4.22–5.81)
RDW: 12.4 % (ref 11.5–15.5)
WBC: 6.4 10*3/uL (ref 4.0–10.5)
nRBC: 0 % (ref 0.0–0.2)

## 2021-02-22 LAB — BASIC METABOLIC PANEL
Anion gap: 6 (ref 5–15)
BUN: 15 mg/dL (ref 6–20)
CO2: 25 mmol/L (ref 22–32)
Calcium: 8.3 mg/dL — ABNORMAL LOW (ref 8.9–10.3)
Chloride: 105 mmol/L (ref 98–111)
Creatinine, Ser: 0.88 mg/dL (ref 0.61–1.24)
GFR, Estimated: >60 mL/min (ref >60)
Glucose, Bld: 90 mg/dL (ref 70–99)
Potassium: 3.7 mmol/L (ref 3.5–5.1)
Sodium: 136 mmol/L (ref 135–145)

## 2021-02-22 SURGERY — RIGHT HEART CATH
Anesthesia: LOCAL

## 2021-02-22 MED ORDER — HEPARIN (PORCINE) IN NACL 1000-0.9 UT/500ML-% IV SOLN
INTRAVENOUS | Status: AC
  Filled 2021-02-22: qty 1000

## 2021-02-22 MED ORDER — SODIUM CHLORIDE 0.9 % IV SOLN: 250.0000 mL | INTRAVENOUS | Status: DC | PRN

## 2021-02-22 MED ORDER — SODIUM CHLORIDE 0.9% FLUSH
3.0000 mL | Freq: Two times a day (BID) | INTRAVENOUS | Status: DC
Start: 2021-02-22 — End: 2021-02-22

## 2021-02-22 MED ORDER — SODIUM CHLORIDE 0.9% FLUSH
3.0000 mL | INTRAVENOUS | Status: DC | PRN
Start: 2021-02-22 — End: 2021-02-22

## 2021-02-22 MED ORDER — SODIUM CHLORIDE 0.9 % IV SOLN: INTRAVENOUS | Status: DC

## 2021-02-22 MED ORDER — LABETALOL HCL 5 MG/ML IV SOLN: 10.0000 mg | INTRAVENOUS | Status: DC | PRN

## 2021-02-22 MED ORDER — LIDOCAINE HCL (PF) 1 % IJ SOLN
INTRAMUSCULAR | Status: AC
  Filled 2021-02-22: qty 30

## 2021-02-22 MED ORDER — SODIUM CHLORIDE 0.9% FLUSH: 3.0000 mL | Freq: Two times a day (BID) | INTRAVENOUS | Status: DC

## 2021-02-22 MED ORDER — HYDRALAZINE HCL 20 MG/ML IJ SOLN: 10.0000 mg | INTRAMUSCULAR | Status: DC | PRN

## 2021-02-22 MED ORDER — ONDANSETRON HCL 4 MG/2ML IJ SOLN: 4.0000 mg | Freq: Four times a day (QID) | INTRAMUSCULAR | Status: DC | PRN

## 2021-02-22 MED ORDER — LIDOCAINE HCL (PF) 1 % IJ SOLN
INTRAMUSCULAR | Status: DC | PRN
  Administered 2021-02-22 (×2): 2 mL

## 2021-02-22 MED ORDER — SODIUM CHLORIDE 0.9% FLUSH: 3.0000 mL | INTRAVENOUS | Status: DC | PRN

## 2021-02-22 MED ORDER — ACETAMINOPHEN 325 MG PO TABS: 650.0000 mg | ORAL_TABLET | ORAL | Status: DC | PRN

## 2021-02-22 MED ORDER — HEPARIN (PORCINE) IN NACL 1000-0.9 UT/500ML-% IV SOLN
INTRAVENOUS | Status: DC | PRN
  Administered 2021-02-22: 500 mL

## 2021-02-22 SURGICAL SUPPLY — 7 items
CATH BALLN WEDGE 5F 110CM (CATHETERS) ×2 IMPLANT
GUIDEWIRE .025 260CM (WIRE) ×2 IMPLANT
KIT MICROINTRODUCER 5F 7206 (SHEATH) ×2 IMPLANT
KIT MICROPUNCTURE NIT STIFF (SHEATH) ×2 IMPLANT
PACK CARDIAC CATHETERIZATION (CUSTOM PROCEDURE TRAY) ×2 IMPLANT
SHEATH GLIDE SLENDER 4/5FR (SHEATH) ×4 IMPLANT
TRANSDUCER W/STOPCOCK (MISCELLANEOUS) ×2 IMPLANT

## 2021-02-22 NOTE — Discharge Instructions (Signed)
Call Dr Bensimhon's office if any problems,questions or concerns; call if any redness, drainage, fever, pain, swelling, or bleeding  either side where procedure done

## 2021-02-22 NOTE — H&P (Signed)
ADVANCED HF CLINIC NOTE  PCP: Diamantina Providence, FNP Primary Cardiologist: Dr. Rosemary Holms  HPI:  Gary [name] is a 45 y.o. 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  Echo 05/06/19 confirmed EF 25-30% with severe MR and mild to moderate AI. RVEF moderately decreased.  CPX test 9/20 with severe HF limitation. Repeat CPX test at [facility] was improved  Echo 03/03/20 with Dr. Rosemary Holms EF read as 40-45% Moderate-severe AI, severe MR, mod-severe TR. Saw Dr Ladona Ridgel. No ICD based on EF improvement.   CPX 10/21 Peak VO2: 21.1 (56% predicted peak VO2) slope:  41  Last visit 07/2020 patient stable, chronic low bp but asymptomatic, volume status was good.  [name] started however at follow up at [facility] was no longer taking.    Last seen by Dr. Edwena Blow at [facility] 10/2020.  Deferred CT with contrast until actual listing.  Given severe MR requests reevaluating PA pressures.    Continues to do OK. SOB with mild activity. No edema, orthopnea or PND.   Here for RHC as part of Transplant w/u.   Cardiac studies:  cMRI 2/19 1. Severely dilated left ventricular size, with normal thickness and severely decreased systolic function (LVEF =31%). There diffuse hypokinesis and paradoxical septal motion.There is no late gadolinium enhancement in the left ventricular myocardium. 2. Normal right ventricular size, thickness and mildly decreasedsystolic function (LVEF = 42%).  3.There are significant trabeculations in the left ventricular apex with non-compacted to compacted myocardium ratio: 2.6:1. This is consistent with non-compaction cardiomyopathy. 4. Moderately dilated left atrium and mildly dilated right  5. Mild to moderate aortic insufficiency. Moderate mitral regurgitation with posteriorly directed jet. Mild tricuspid regurgitation. No  significant pulmonary regurgitation is seen.  -Echo 8/20 EF 25-30% with severe non-compaction. Severe MR with severe LAE  - TEE 8/20 EF 25-30% moderately reduced RV function severe MR, mod TR  R/L cath normal coronaries RA =  3 RV =  45/2 PA  = 45/12 (28) PCWP = 18 with large v-waves Fick = 4.0/2.6  Repeat CPX at [facility] 3/21 pVO2 19.7 (45%) VeVCO2 31 RER 1.36  CPX 9/20  FVC 2.28 (62%)      FEV1 1.79 (60%)        FEV1/FVC 79 (96%)        MVV 72(50%)   Resting HR: 73 Standing HR: 76 Peak HR: 110   (63% age predicted max HR)  BP rest: 102/60 Standing HR: 108/60 BP peak: 124/62  Peak VO2: 15.7 (41% predicted peak VO2)  VE/VCO2 slope:  55  OUES: 0.83  Peak RER: 1.11  Ventilatory Threshold: 13.6 (36% predicted or 87% measured peak VO2)  VE/MVV:  60%  O2pulse:  7   (64% predicted O2pulse)   Had echo at [facility] in 9/20 an EF read as 50-55%.    ROS: All systems negative except as listed in HPI, PMH and Problem List.  SH:  Social History   Socioeconomic History   Marital status: Single    Spouse name: Not on file   Number of children: 8   Years of education: Not on file   Highest education level: Not on file  Occupational History   Not on file  Tobacco Use   Smoking status: Former    Packs/day: 1.00    Years: 20.00    Pack years: 20.00    Types:  Cigarettes    Quit date: 09/09/2018    Years since quitting: 2.4   Smokeless tobacco: Never  Vaping Use   Vaping Use: Never used  Substance and Sexual Activity   Alcohol use: Yes    Comment: occ   Drug use: Not Currently    Types: Marijuana   Sexual activity: Not on file  Other Topics Concern   Not on file  Social History Narrative   Not on file   Social Determinants of Health   Financial Resource Strain: Not on file  Food Insecurity: Not on file  Transportation Needs: Not on file  Physical Activity: Not on file  Stress: Not on file  Social Connections: Not on file  Intimate Partner Violence: Not on file     FH:  Family History  Problem Relation Age of Onset   Sarcoidosis Mother    Healthy Father     Past Medical History:  Diagnosis Date   Acute on chronic combined systolic and diastolic CHF, NYHA class 3 (HCC) 01/11/2019   Acute on chronic congestive heart failure (HCC)    Acute on chronic systolic and diastolic heart failure, NYHA class 1 (HCC) 02/17/2019   Atrial fibrillation (HCC)    CHF (congestive heart failure) (HCC)    Chronic atrial fibrillation (HCC) 10/27/2018   Chronic systolic heart failure (HCC) 05/16/2019   Dysrhythmia    hx. 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   Protein-calorie malnutrition, severe 10/17/2017   Severe mitral regurgitation 01/31/2019    Current Facility-Administered Medications  Medication Dose Route Frequency Provider Last Rate Last Admin   0.9 %  sodium chloride infusion  250 mL Intravenous PRN Oneal Biglow, Bevelyn Buckles, MD       0.9 %  sodium chloride infusion   Intravenous Continuous Arshad Oberholzer, Bevelyn Buckles, MD 10 mL/hr at 02/22/21 1049 New Bag at 02/22/21 1049   Heparin (Porcine) in NaCl 1000-0.9 UT/500ML-% SOLN    PRN Layton Naves, Bevelyn Buckles, MD   500 mL at 02/22/21 1320   sodium chloride flush (NS) 0.9 % injection 3 mL  3 mL Intravenous Q12H Emri Sample, Bevelyn Buckles, MD       sodium chloride flush (NS) 0.9 % injection 3 mL  3 mL Intravenous PRN Andie Mortimer, Bevelyn Buckles, MD        Vitals:   02/22/21 0957 02/22/21 1319  BP: (!) 85/54   Pulse: 76   Resp: 16   Temp: 98 F (36.7 C)   TempSrc: Oral   SpO2: 96% 100%  Weight: 56.7 kg   Height: 5\' 6"  (1.676 m)     PHYSICAL EXAM:  General:  Well appearing. No resp difficulty HEENT: normal Neck: supple. no JVD. Carotids 2+ bilat; no bruits. No lymphadenopathy or thryomegaly appreciated. Cor: PMI  nondisplaced. irregular rate & rhythm. 3/6 MRLungs: clear Abdomen: soft, nontender, nondistended. No hepatosplenomegaly. No bruits or masses. Good bowel sounds. Extremities: no cyanosis, clubbing, rash, edema Neuro: alert & orientedx3, cranial nerves grossly intact. moves all 4 extremities w/o difficulty. Affect pleasant   1. 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 No LGE - Cath 8/20 normal cors - Echo 8/20 EF 25-30% severe MR. Severe LAE - TEE 8/20 EF  25-30% severe MR. Moderately reduced RV function - Echo 05/06/19 EF 25-30% with severe MR and mild to moderate AI. RVEF moderately decreased.  - CPX in 9/20 with severe limitation pVO2: 15.7 (41% predicted peak VO2) slope 55 - Repeat [facility] at [facility] 3/21 improved. pVO2 19.7 (45%) VeVCO2 31 - CPX 10/21 Peak VO2: 21.1 (56% predicted peak VO2) slope:  41  - Echo 03/03/20 with Dr. Rosemary Holms EF read as 40-45% Moderate-severe AI, severe MR, mod-severe TR. Saw Dr Ladona Ridgel. No ICD based on EF improvement.  - stable NYHA II-III - Volume status looks good. BP soft but stable - Off digoxin due to severe bradycardia in 40s - Blood type O-pos - Continue Entresto 24/26 bid - Continue Bidil 1 tab tid - Continue Toprol XL 12.5 mg daily - Continue spiro 12.5 mg daily - Not taking Farxiga 5 due to dizziness and volume depletion.  - RHC today as part of Transplant w/u  2. Permanent AF - Toerating rate control well.  - Continue apixaban. No bleeding - likely not candidate for RFA or DC-CV given severe LA - stable  3. Severe MR - TEE also suggests mild to moderate MS so may not be candidate for Clip if not transplant candidate. 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 Tobacco use, ongoing - has quit since 3/20.  - no change   Arvilla Meres, MD  1:45 PM

## 2021-03-08 ENCOUNTER — Other Ambulatory Visit (HOSPITAL_COMMUNITY): Payer: Self-pay | Admitting: *Deleted

## 2021-03-08 ENCOUNTER — Telehealth (HOSPITAL_COMMUNITY): Payer: Self-pay | Admitting: *Deleted

## 2021-03-08 NOTE — Telephone Encounter (Signed)
Pt left vm requesting refill of farxiga. Pt said he was told to restart medication. Last note isee from Dr.Bensimhon says - Not taking Farxiga 5 due to dizziness and volume depletion.   Routed to Dr.Bensimhon for advice

## 2021-03-09 ENCOUNTER — Other Ambulatory Visit (HOSPITAL_COMMUNITY): Payer: Self-pay | Admitting: *Deleted

## 2021-03-09 MED ORDER — DAPAGLIFLOZIN PROPANEDIOL 5 MG PO TABS: 5.0000 mg | ORAL_TABLET | Freq: Every day | ORAL | 3 refills | Status: DC

## 2021-03-10 ENCOUNTER — Other Ambulatory Visit: Payer: Self-pay

## 2021-03-10 ENCOUNTER — Telehealth: Payer: Self-pay | Admitting: Cardiology

## 2021-03-10 MED ORDER — ENTRESTO 24-26 MG PO TABS: 1.0000 | ORAL_TABLET | Freq: Two times a day (BID) | ORAL | 6 refills | Status: DC

## 2021-03-10 NOTE — Telephone Encounter (Signed)
Done

## 2021-03-10 NOTE — Telephone Encounter (Signed)
Pt called to request prescription refill for entresto.

## 2021-03-10 NOTE — Telephone Encounter (Signed)
Late entry: pt made aware on 8/30 and medication sent to pharmacy

## 2021-03-16 ENCOUNTER — Emergency Department (HOSPITAL_COMMUNITY): Payer: Medicaid Other

## 2021-03-16 ENCOUNTER — Encounter (HOSPITAL_COMMUNITY): Payer: Self-pay | Admitting: Emergency Medicine

## 2021-03-16 ENCOUNTER — Telehealth: Payer: Self-pay | Admitting: Cardiology

## 2021-03-16 ENCOUNTER — Encounter (HOSPITAL_COMMUNITY): Payer: Self-pay

## 2021-03-16 ENCOUNTER — Other Ambulatory Visit (HOSPITAL_COMMUNITY): Payer: Self-pay | Admitting: *Deleted

## 2021-03-16 ENCOUNTER — Ambulatory Visit (HOSPITAL_COMMUNITY)
Admission: EM | Admit: 2021-03-16 | Discharge: 2021-03-16 | Disposition: A | Discharge status: Home / Self Care | Payer: Medicaid Other | Attending: Emergency Medicine | Admitting: Emergency Medicine

## 2021-03-16 ENCOUNTER — Other Ambulatory Visit: Payer: Self-pay

## 2021-03-16 ENCOUNTER — Emergency Department (HOSPITAL_COMMUNITY)
Admission: EM | Admit: 2021-03-16 | Discharge: 2021-03-17 | Disposition: A | Discharge status: Home / Self Care | Payer: Medicaid Other | Attending: Emergency Medicine | Admitting: Emergency Medicine

## 2021-03-16 DIAGNOSIS — I5022 Chronic systolic (congestive) heart failure: Secondary | ICD-10-CM | POA: Diagnosis present

## 2021-03-16 DIAGNOSIS — R5383 Other fatigue: Secondary | ICD-10-CM | POA: Diagnosis not present

## 2021-03-16 DIAGNOSIS — R0601 Orthopnea: Secondary | ICD-10-CM | POA: Diagnosis present

## 2021-03-16 DIAGNOSIS — I110 Hypertensive heart disease with heart failure: Secondary | ICD-10-CM | POA: Insufficient documentation

## 2021-03-16 DIAGNOSIS — Z20822 Contact with and (suspected) exposure to covid-19: Secondary | ICD-10-CM | POA: Diagnosis not present

## 2021-03-16 DIAGNOSIS — R0602 Shortness of breath: Secondary | ICD-10-CM | POA: Insufficient documentation

## 2021-03-16 DIAGNOSIS — Z87891 Personal history of nicotine dependence: Secondary | ICD-10-CM | POA: Diagnosis not present

## 2021-03-16 DIAGNOSIS — I5043 Acute on chronic combined systolic (congestive) and diastolic (congestive) heart failure: Secondary | ICD-10-CM | POA: Insufficient documentation

## 2021-03-16 DIAGNOSIS — Z7901 Long term (current) use of anticoagulants: Secondary | ICD-10-CM | POA: Diagnosis not present

## 2021-03-16 DIAGNOSIS — R197 Diarrhea, unspecified: Secondary | ICD-10-CM | POA: Insufficient documentation

## 2021-03-16 DIAGNOSIS — Z79899 Other long term (current) drug therapy: Secondary | ICD-10-CM | POA: Insufficient documentation

## 2021-03-16 LAB — URINALYSIS, MICROSCOPIC (REFLEX)

## 2021-03-16 LAB — URINALYSIS, ROUTINE W REFLEX MICROSCOPIC
Bilirubin Urine: NEGATIVE
Glucose, UA: NEGATIVE mg/dL
Hgb urine dipstick: NEGATIVE
Ketones, ur: NEGATIVE mg/dL
Nitrite: NEGATIVE
Protein, ur: NEGATIVE mg/dL
Specific Gravity, Urine: 1.01 (ref 1.005–1.030)
pH: 6 (ref 5.0–8.0)

## 2021-03-16 LAB — SARS CORONAVIRUS 2 (TAT 6-24 HRS): SARS Coronavirus 2: NEGATIVE

## 2021-03-16 MED ORDER — FUROSEMIDE 40 MG PO TABS: ORAL_TABLET | ORAL | 1 refills | Status: DC

## 2021-03-16 NOTE — ED Provider Notes (Signed)
[facility]    CSN: [id number] Arrival date & time: 03/16/21  0855      History   Chief Complaint Chief Complaint  Patient presents with   Shortness of Breath   Abdominal Pain   Chills   Diarrhea    HPI Gary [name] is a 45 y.o. male.   Patient here for evaluation of shortness of breath, heart palpitations, and fatigue that has been ongoing for the past 4 days.  Patient does report a history of CHF and states that he has been out of several medications including Entresto.  Denies any recent sick contacts.  Denies any significant lower extremity edema but admits that he has not weighed himself recently.  Denies any trauma, injury, or other precipitating event.  Denies any specific alleviating or aggravating factors.  Denies any fevers, N/V/D, numbness, tingling, weakness, abdominal pain, or headaches.    The history is provided by the patient.  Shortness of Breath Associated symptoms: abdominal pain   Associated symptoms: no fever   Abdominal Pain Associated symptoms: chills, diarrhea, fatigue and shortness of breath   Associated symptoms: no fever   Diarrhea Associated symptoms: abdominal pain and chills   Associated symptoms: no fever    Past Medical History:  Diagnosis Date   Acute on chronic combined systolic and diastolic CHF, NYHA class 3 (HCC) 01/11/2019   Acute on chronic congestive heart failure (HCC)    Acute on chronic systolic and diastolic heart failure, NYHA class 1 (HCC) 02/17/2019   Atrial fibrillation (HCC)    CHF (congestive heart failure) (HCC)    Chronic atrial fibrillation (HCC) 10/27/2018   Chronic systolic heart failure (HCC) 05/16/2019   Dysrhythmia    hx. 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   Protein-calorie  malnutrition, severe 10/17/2017   Severe mitral regurgitation 01/31/2019    Patient Active Problem List   Diagnosis Date Noted   Chronic systolic heart failure (HCC) 05/16/2019   Acute on chronic congestive heart failure (HCC)    Acute on chronic systolic and diastolic heart failure, NYHA class 1 (HCC) 02/17/2019   Atrial fibrillation (HCC) 01/31/2019   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 old or younger   CARDIAC SURGERY     CARDIOVERSION N/A 08/17/2017   Procedure: CARDIOVERSION;  Surgeon: Orpah Cobb, MD;  Location: [facility] ENDOSCOPY;  Service: Cardiovascular;  Laterality: N/A;   HERNIA REPAIR     INGUINAL HERNIA REPAIR Right 10/13/2017   Procedure: OPEN RIGHT INGUINAL HERNIA REPAIR ERAS PATHWAY;  Surgeon: Jimmye Norman, MD;  Location: [facility] OR;  Service: General;  Laterality: Right;   OPEN HEART SURGERY     AS A CHILD , at 2 yrs. old. done in [facility]   RIGHT HEART CATH N/A 02/22/2021   Procedure: RIGHT  HEART CATH;  Surgeon: Dolores Patty, MD;  Location: [facility] INVASIVE CV LAB;  Service: Cardiovascular;  Laterality: N/A;   RIGHT/LEFT HEART CATH AND CORONARY ANGIOGRAPHY N/A 02/19/2019   Procedure: RIGHT/LEFT HEART CATH AND CORONARY ANGIOGRAPHY;  Surgeon: Elder Negus, MD;  Location: [facility] INVASIVE CV LAB;  Service: Cardiovascular;  Laterality: N/A;   TEE WITHOUT CARDIOVERSION N/A 08/17/2017   Procedure: TRANSESOPHAGEAL ECHOCARDIOGRAM (TEE);  Surgeon: Orpah Cobb, MD;  Location: [facility] ENDOSCOPY;  Service: Cardiovascular;   Laterality: N/A;   TEE WITHOUT CARDIOVERSION N/A 02/19/2019   Procedure: TRANSESOPHAGEAL ECHOCARDIOGRAM (TEE);  Surgeon: Elder Negus, MD;  Location: [facility] ENDOSCOPY;  Service: Cardiovascular;  Laterality: N/A;       Home Medications    Prior to Admission medications   Medication Sig Start Date End Date Taking? Authorizing Provider  apixaban (ELIQUIS) 5 MG TABS tablet Take 1 tablet (5 mg total) by mouth 2 (two) times daily. 12/22/20   Patwardhan, Manish J, MD  BIDIL 20-37.5 MG tablet Take 1 tablet by mouth 3 (three) times daily. 02/02/21   Patwardhan, Anabel Bene, MD  dapagliflozin propanediol (FARXIGA) 5 MG TABS tablet Take 1 tablet (5 mg total) by mouth daily before breakfast. 03/09/21   Bensimhon, Bevelyn Buckles, MD  furosemide (LASIX) 40 MG tablet Take 1 pill every morning. Take additional 1 pill in the afternoon on days with excessive shortness of breath, cough. 03/16/21   Ivette Loyal, NP  metoprolol succinate (TOPROL XL) 25 MG 24 hr tablet Take 1 tablet (25 mg total) by mouth daily. 01/28/21 01/28/22  Patwardhan, Anabel Bene, MD  potassium chloride SA (KLOR-CON) 20 MEQ tablet Take 1 tablet (20 mEq total) by mouth daily. 07/23/20   Laurey Morale, MD  sacubitril-valsartan (ENTRESTO) 24-26 MG Take 1 tablet by mouth 2 (two) times daily. 03/10/21   Patwardhan, Anabel Bene, MD  spironolactone (ALDACTONE) 25 MG tablet Take 0.5 tablets (12.5 mg total) by mouth daily. 11/02/20   Patwardhan, Anabel Bene, MD    Family History Family History  Problem Relation Age of Onset   Sarcoidosis Mother    Healthy Father     Social History Social History   Tobacco Use   Smoking status: Former    Packs/day: 1.00    Years: 20.00    Pack years: 20.00    Types: Cigarettes    Quit date: 09/09/2018    Years since quitting: 2.5   Smokeless tobacco: Never  Vaping Use   Vaping Use: Never used  Substance Use Topics   Alcohol use: Yes    Comment: occ   Drug use: Not Currently    Types: Marijuana     Allergies    Farxiga [dapagliflozin] and Aspirin   Review of Systems Review of Systems  Constitutional:  Positive for chills and fatigue. Negative for fever.  Respiratory:  Positive for shortness of breath.   Cardiovascular:  Positive for palpitations. Negative for leg swelling.  Gastrointestinal:  Positive for abdominal pain and diarrhea.  All other systems reviewed and are negative.   Physical Exam Triage Vital Signs ED Triage Vitals [03/16/21 0955]  Enc Vitals Group     BP 125/74     Pulse Rate 77     Resp 17     Temp 99 F (37.2 C)     Temp Source Oral     SpO2 95 %     Weight      Height      Head Circumference  Peak Flow      Pain Score 5     Pain Loc      Pain Edu?      Excl. in GC?    No data found.  Updated Vital Signs BP 125/74 (BP Location: Left Arm)   Pulse 77   Temp 99 F (37.2 C) (Oral)   Resp 17   SpO2 95%   Visual Acuity Right Eye Distance:   Left Eye Distance:   Bilateral Distance:    Right Eye Near:   Left Eye Near:    Bilateral Near:     Physical Exam Vitals and nursing note reviewed.  Constitutional:      General: He is not in acute distress.    Appearance: Normal appearance. He is not ill-appearing, toxic-appearing or diaphoretic.  HENT:     Head: Normocephalic and atraumatic.  Eyes:     Conjunctiva/sclera: Conjunctivae normal.  Cardiovascular:     Rate and Rhythm: Normal rate. Rhythm irregular.     Pulses: Normal pulses.  Pulmonary:     Effort: Pulmonary effort is normal.     Breath sounds: Normal breath sounds.  Chest:     Chest wall: No mass or tenderness.  Abdominal:     General: Abdomen is flat.  Musculoskeletal:        General: Normal range of motion.     Cervical back: Normal range of motion.     Right lower leg: No tenderness. No edema.     Left lower leg: No tenderness. No edema.  Skin:    General: Skin is warm and dry.  Neurological:     General: No focal deficit present.     Mental Status: He is alert and oriented  to person, place, and time.  Psychiatric:        Mood and Affect: Mood normal.     UC Treatments / Results  Labs (all labs ordered are listed, but only abnormal results are displayed) Labs Reviewed  SARS CORONAVIRUS 2 (TAT 6-24 HRS)    EKG   Radiology No results found.  Procedures Procedures (including critical care time)  Medications Ordered in UC Medications - No data to display  Initial Impression / Assessment and Plan / UC Course  I have reviewed the triage vital signs and the nursing notes.  Pertinent labs & imaging results that were available during my care of the patient were reviewed by me and considered in my medical decision making (see chart for details).    Assessment negative for any red flags or concerns.  Will obtain COVID test as patient does have some viral symptoms and was recently in the hospital.  EKG showed A. fib with a normal rate but irregular rhythm but primarily unchanged from previous.  Instructed patient to take all medications as prescribed.  Follow-up with cardiology for refill on Entresto as it does appear that they sent over the prior authorization today.  Follow-up with primary care for reevaluation as soon as possible.  Strict ED follow-up for any red flag symptoms. Final Clinical Impressions(s) / UC Diagnoses   Final diagnoses:  Shortness of breath  Chronic systolic heart failure (HCC)  Diarrhea, unspecified type     Discharge Instructions      Make sure you are taking all of your medications as prescribed.  Follow up with your cardiologist for a refill on your entresto.   Weigh yourself daily.  Try to limit your daily fluid intake to or less.  Follow up with your primary care provider for re-evaluation.   If you develop the worse headache of your life, worsening dizziness, nausea/vomiting, blurred vision, slurred speech, difficulty walking, weakness on one side, chest pain, shortness of breath, or altered mental status,  call 911 or go directly to the Emergency Department for further evaluation.         ED Prescriptions     Medication Sig Dispense Auth. Provider   furosemide (LASIX) 40 MG tablet Take 1 pill every morning. Take additional 1 pill in the afternoon on days with excessive shortness of breath, cough. 60 tablet Ivette Loyal, NP      PDMP not reviewed this encounter.   Ivette Loyal, NP 03/16/21 1045

## 2021-03-16 NOTE — ED Notes (Signed)
Patient at [facility]

## 2021-03-16 NOTE — ED Triage Notes (Signed)
Pt in with c/o sob that has been going on for 4 days. Pt also c/o chills and diarrhea  Pt states he has chf and ran out of his entresto and fargixa last week

## 2021-03-16 NOTE — ED Triage Notes (Signed)
Patient complaining of not being able to sleep. Patient was out of entresto and fargixa. Patient got it and took it before coming over here. Patient states he has sob when he lay down.

## 2021-03-16 NOTE — Telephone Encounter (Signed)
Pt called last week to request refill for entresto - says needs a pre authorization, wanted to know if someone could do that or let him know when he can pick up prescription

## 2021-03-16 NOTE — ED Notes (Signed)
Patient had a covid test in [facility] today and it is negative.

## 2021-03-16 NOTE — ED Provider Notes (Signed)
[facility]-EMERGENCY DEPT Provider Note  CSN: [id number] Arrival date & time: 03/16/21 2047  Chief Complaint(s) orthopnea (1 week w/o Entresto)  HPI Gary [name] is a 45 y.o. male with a past medical history listed below including chronic systolic and diastolic heart failure who presents to the emergency department with 1 week of generalized fatigue and orthopnea.  Patient has been out of his Entresto for 1 week.  Given his increased symptoms, he went to urgent care earlier this morning and then [facility]. Got a few Entresto from [facility] [employer] and was able to take his meds prior to coming.  Symptoms persisted prompting his visit tonight.   Since arriving his orthopnea has resolved. No chest pain. No recent fever or infections. [facility]   Baseline BP 90/50s. 120/70s at [facility]  HPI  Past Medical History Past Medical History:  Diagnosis Date   Acute on chronic combined systolic and diastolic CHF, NYHA class 3 (HCC) 01/11/2019   Acute on chronic congestive heart failure (HCC)    Acute on chronic systolic and diastolic heart failure, NYHA class 1 (HCC) 02/17/2019   Atrial fibrillation (HCC)    CHF (congestive heart failure) (HCC)    Chronic atrial fibrillation (HCC) 10/27/2018   Chronic systolic heart failure (HCC) 05/16/2019   Dysrhythmia    hx. 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   Protein-calorie malnutrition, severe 10/17/2017   Severe mitral regurgitation 01/31/2019   Patient Active Problem List   Diagnosis Date Noted   Chronic systolic heart failure (HCC) 05/16/2019   Acute on chronic congestive heart failure (HCC)    Acute on chronic systolic and diastolic heart failure, NYHA class 1  (HCC) 02/17/2019   Atrial fibrillation (HCC) 01/31/2019   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 Authorizing Provider  apixaban (ELIQUIS) 5 MG TABS tablet Take 1 tablet (5 mg total) by mouth 2 (two) times daily. 12/22/20   Patwardhan, Manish J, MD  BIDIL 20-37.5 MG tablet Take 1 tablet by mouth 3 (three) times daily. 02/02/21   Patwardhan, Anabel BeneManish J, MD  dapagliflozin propanediol (FARXIGA) 5 MG TABS tablet Take 1 tablet (5 mg total) by mouth daily before breakfast. 03/09/21   Bensimhon, Bevelyn Bucklesaniel R, MD  furosemide (LASIX) 40 MG tablet Take 1 pill every morning. Take additional 1 pill in the afternoon on days with excessive shortness of breath, cough. 03/16/21   Ivette LoyalSmith, Fowler R, NP  metoprolol succinate (TOPROL XL) 25 MG 24 hr tablet Take 1 tablet (25 mg total) by mouth daily. 01/28/21 01/28/22  Patwardhan, Anabel BeneManish J, MD  potassium chloride SA (KLOR-CON) 20 MEQ tablet Take 1 tablet (20 mEq total) by mouth daily. 07/23/20   Laurey MoraleMcLean, Dalton S, MD  sacubitril-valsartan (ENTRESTO) 24-26 MG Take 1 tablet by mouth 2 (two) times daily.  03/10/21   Patwardhan, Anabel Bene, MD  spironolactone (ALDACTONE) 25 MG tablet Take 0.5 tablets (12.5 mg total) by mouth daily. 11/02/20   Patwardhan, Anabel Bene, MD                                                                                                                                    Past Surgical History Past Surgical History:  Procedure Laterality Date   ABDOMINAL SURGERY     ABDOMINAL SURGERY     under 10 yrs. old or younger    CARDIAC SURGERY     CARDIOVERSION N/A 08/17/2017   Procedure: CARDIOVERSION;  Surgeon: Orpah Cobb, MD;  Location: [facility] ENDOSCOPY;  Service: Cardiovascular;  Laterality: N/A;   HERNIA REPAIR     INGUINAL HERNIA REPAIR Right 10/13/2017   Procedure: OPEN RIGHT INGUINAL HERNIA REPAIR ERAS PATHWAY;  Surgeon: Jimmye Norman, MD;  Location: [facility] OR;  Service: General;  Laterality: Right;   OPEN HEART SURGERY     AS A CHILD , at 2 yrs. old. done in [facility]   RIGHT HEART CATH N/A 02/22/2021   Procedure: RIGHT HEART CATH;  Surgeon: Dolores Patty, MD;  Location: [facility] INVASIVE CV LAB;  Service: Cardiovascular;  Laterality: N/A;   RIGHT/LEFT HEART CATH AND CORONARY ANGIOGRAPHY N/A 02/19/2019   Procedure: RIGHT/LEFT HEART CATH AND CORONARY ANGIOGRAPHY;  Surgeon: Elder Negus, MD;  Location: [facility] INVASIVE CV LAB;  Service: Cardiovascular;  Laterality: N/A;   TEE WITHOUT CARDIOVERSION N/A 08/17/2017   Procedure: TRANSESOPHAGEAL ECHOCARDIOGRAM (TEE);  Surgeon: Orpah Cobb, MD;  Location[facility] ENDOSCOPY;  Service: Cardiovascular;  Laterality: N/A;   TEE WITHOUT CARDIOVERSION N/A 02/19/2019   Procedure: TRANSESOPHAGEAL ECHOCARDIOGRAM (TEE);  Surgeon: Elder Negus, MD;  Location: [facility] ENDOSCOPY;  Service: Cardiovascular;  Laterality: N/A;   Family History Family History  Problem Relation Age of Onset   Sarcoidosis Mother    Healthy Father     Social History Social History   Tobacco Use   Smoking status: Former    Packs/day: 1.00    Years: 20.00    Pack years: 20.00    Types: Cigarettes    Quit date: 09/09/2018    Years since quitting: 2.5   Smokeless tobacco: Never  Vaping Use   Vaping Use: Never used  Substance Use Topics   Alcohol use: Yes    Comment: occ   Drug use: Not Currently    Types: Marijuana   Allergies Farxiga [dapagliflozin] and Aspirin  Review of Systems Review of Systems All other systems are reviewed and are negative for acute change except as noted in the  HPI  Physical Exam Vital Signs  I have reviewed the triage vital signs BP (!) 88/52   Pulse 79   Temp 99.1 F (37.3 C) (Oral)   Resp 15   Ht 5\' 5"  (1.651 m)   Wt 56.1 kg   SpO2 94%   BMI 20.57 kg/m  Physical Exam Vitals reviewed.  Constitutional:      General: He is not in acute distress.    Appearance: He is well-developed. He is not diaphoretic.  HENT:     Head: Normocephalic and atraumatic.     Nose: Nose normal.  Eyes:     General: No scleral icterus.       Right eye: No discharge.        Left eye: No discharge.     Conjunctiva/sclera: Conjunctivae normal.     Pupils: Pupils are equal, round, and reactive to light.  Neck:     Vascular: JVD present.  Cardiovascular:     Rate and Rhythm: Normal rate. Rhythm irregularly irregular.     Heart sounds: No murmur heard.   No friction rub. No gallop.  Pulmonary:     Effort: Pulmonary effort is normal. No respiratory distress.     Breath sounds: Normal breath sounds. No stridor. No rales.  Abdominal:     General: There is no distension.     Palpations: Abdomen is soft.     Tenderness: There is no abdominal tenderness.  Musculoskeletal:        General: No tenderness.     Cervical back: Normal range of motion and neck supple.     Right lower leg: No edema.     Left lower leg: No edema.  Skin:    General: Skin is warm and dry.     Findings: No erythema or rash.  Neurological:     Mental Status: He is alert and oriented to person, place, and time.    ED Results and Treatments Labs (all labs ordered are listed, but only abnormal results are displayed) Labs Reviewed  URINALYSIS, ROUTINE W REFLEX MICROSCOPIC - Abnormal; Notable for the following components:      Result Value   Leukocytes,Ua TRACE (*)    All other components within normal limits  URINALYSIS, MICROSCOPIC (REFLEX) - Abnormal; Notable for the following components:   Bacteria, UA RARE (*)    All other components within normal limits  CBC WITH  DIFFERENTIAL/PLATELET - Abnormal; Notable for the following components:   RBC 4.01 (*)    Hemoglobin 12.5 (*)    HCT 36.0 (*)    Platelets 138 (*)    Monocytes Absolute 1.2 (*)    All other components within normal limits  COMPREHENSIVE METABOLIC PANEL - Abnormal; Notable for the following components:   Glucose, Bld 142 (*)    BUN 22 (*)    Calcium 8.6 (*)    Total Protein 6.1 (*)    Total Bilirubin 2.5 (*)    All other components within normal limits  BRAIN NATRIURETIC PEPTIDE - Abnormal; Notable for the following components:   B Natriuretic Peptide 4,084.4 (*)    All other components within normal limits  TROPONIN I (HIGH SENSITIVITY) - Abnormal; Notable for the following components:   Troponin I (High Sensitivity) 69 (*)    All other components within normal limits  TROPONIN I (HIGH SENSITIVITY) - Abnormal; Notable for the following components:   Troponin I (High Sensitivity) 60 (*)    All other components within normal limits  EKG  EKG Interpretation  Date/Time:  Tuesday March 16 2021 23:07:11 EDT Ventricular Rate:  80 PR Interval:    QRS Duration: 122 QT Interval:  424 QTC Calculation: 490 R Axis:   47 Text Interpretation: Atrial fibrillation Ventricular premature complex LVH with IVCD and secondary repol abnrm Borderline prolonged QT interval No acute changes Confirmed by Drema Pry [phone]) on 03/17/2021 12:13:56 AM       Radiology DG Chest 2 View  Result Date: 03/16/2021 CLINICAL DATA:  Shortness of breath EXAM: CHEST - 2 VIEW COMPARISON:  02/17/2019 FINDINGS: Cardiomegaly. No confluent airspace opacities or effusions. Mild vascular congestion. No acute bony abnormality. IMPRESSION: Cardiomegaly, vascular congestion. Electronically Signed   By: Charlett Nose M.D.   On: 03/16/2021 21:52    Pertinent labs & imaging results that were available  during my care of the patient were reviewed by me and considered in my medical decision making (see MDM for details).  Medications Ordered in ED Medications - No data to display                                                                                                                                   Procedures Procedures  (including critical care time)  Medical Decision Making / ED Course I have reviewed the nursing notes for this encounter and the patient's prior records (if available in EHR or on provided paperwork).  Cace Osorto Kilgour was evaluated in Emergency Department on 03/17/2021 for the symptoms described in the history of present illness. He was evaluated in the context of the global COVID-19 pandemic, which necessitated consideration that the patient might be at risk for infection with the SARS-CoV-2 virus that causes COVID-19. Institutional protocols and algorithms that pertain to the evaluation of patients at risk for COVID-19 are in a state of rapid change based on information released by regulatory bodies including the CDC and federal and state organizations. These policies and algorithms were followed during the patient's care in the ED.     Patient presents with orthopnea and a setting of heart failure, and not having his Entresto for 1 week. At baseline patient's blood pressures are 90s over 50s.  Noted to be 120s over 70s at urgent care -this is likely too high for the patient stressing his heart. Currently his blood pressures are at his baseline and he is asymptomatic after taking his Entresto. Screening labs obtain. Chest x-ray obtain. EKG noted for rate controlled atrial fibrillation.  Pertinent labs & imaging results that were available during my care of the patient were reviewed by me and considered in my medical decision making:  Blood work is consistent with chronic heart failure.  BNP is greater than 4000 which is up from his previous 9 months ago. His  troponin is lower than his baseline. Chest x-ray without evidence of pulmonary edema. Patient was monitored for several hours and ambulated without complication at the satting.  Spoke to [facility] cardiology who agreed patient should be stable for discharge home and they will arrange for close follow-up either this week or early next week.  Final Clinical Impression(s) / ED Diagnoses Final diagnoses:  Orthopnea   The patient appears reasonably screened and/or stabilized for discharge and I doubt any other medical condition or other [facility] requiring further screening, evaluation, or treatment in the ED at this time prior to discharge. Safe for discharge with strict return precautions.  Disposition: Discharge  Condition: Good  I have discussed the results, Dx and Tx plan with the patient/family who expressed understanding and agree(s) with the plan. Discharge instructions discussed at length. The patient/family was given strict return precautions who verbalized understanding of the instructions. No further questions at time of discharge.    ED Discharge Orders     None        Follow Up: Elder Negus, MD [address] [place] [address] [phone]  Call  if you have not been called about your appointment     This chart was dictated using voice recognition software.  Despite best efforts to proofread,  errors can occur which can change the documentation meaning.    Nira Conn, MD 03/17/21 [phone]

## 2021-03-16 NOTE — Discharge Instructions (Addendum)
Make sure you are taking all of your medications as prescribed.  Follow up with your cardiologist for a refill on your entresto.   Weigh yourself daily.  Try to limit your daily fluid intake to or less.    Follow up with your primary care provider for re-evaluation.   If you develop the worse headache of your life, worsening dizziness, nausea/vomiting, blurred vision, slurred speech, difficulty walking, weakness on one side, chest pain, shortness of breath, or altered mental status, call 911 or go directly to the Emergency Department for further evaluation.

## 2021-03-17 ENCOUNTER — Other Ambulatory Visit (HOSPITAL_COMMUNITY): Payer: Self-pay

## 2021-03-17 ENCOUNTER — Telehealth (HOSPITAL_COMMUNITY): Payer: Self-pay | Admitting: Pharmacy Technician

## 2021-03-17 LAB — CBC WITH DIFFERENTIAL/PLATELET
Abs Immature Granulocytes: 0.01 10*3/uL (ref 0.00–0.07)
Basophils Absolute: 0 10*3/uL (ref 0.0–0.1)
Basophils Relative: 0 %
Eosinophils Absolute: 0 10*3/uL (ref 0.0–0.5)
Eosinophils Relative: 0 %
HCT: 36 % — ABNORMAL LOW (ref 39.0–52.0)
Hemoglobin: 12.5 g/dL — ABNORMAL LOW (ref 13.0–17.0)
Immature Granulocytes: 0 %
Lymphocytes Relative: 14 %
Lymphs Abs: 1.2 10*3/uL (ref 0.7–4.0)
MCH: 31.2 pg (ref 26.0–34.0)
MCHC: 34.7 g/dL (ref 30.0–36.0)
MCV: 89.8 fL (ref 80.0–100.0)
Monocytes Absolute: 1.2 10*3/uL — ABNORMAL HIGH (ref 0.1–1.0)
Monocytes Relative: 14 %
Neutro Abs: 6.2 10*3/uL (ref 1.7–7.7)
Neutrophils Relative %: 72 %
Platelets: 138 10*3/uL — ABNORMAL LOW (ref 150–400)
RBC: 4.01 MIL/uL — ABNORMAL LOW (ref 4.22–5.81)
RDW: 12.1 % (ref 11.5–15.5)
WBC: 8.7 10*3/uL (ref 4.0–10.5)
nRBC: 0 % (ref 0.0–0.2)

## 2021-03-17 LAB — COMPREHENSIVE METABOLIC PANEL
ALT: 26 U/L (ref 0–44)
AST: 23 U/L (ref 15–41)
Albumin: 3.6 g/dL (ref 3.5–5.0)
Alkaline Phosphatase: 98 U/L (ref 38–126)
Anion gap: 8 (ref 5–15)
BUN: 22 mg/dL — ABNORMAL HIGH (ref 6–20)
CO2: 24 mmol/L (ref 22–32)
Calcium: 8.6 mg/dL — ABNORMAL LOW (ref 8.9–10.3)
Chloride: 103 mmol/L (ref 98–111)
Creatinine, Ser: 1.04 mg/dL (ref 0.61–1.24)
GFR, Estimated: >60 mL/min (ref >60)
Glucose, Bld: 142 mg/dL — ABNORMAL HIGH (ref 70–99)
Potassium: 3.6 mmol/L (ref 3.5–5.1)
Sodium: 135 mmol/L (ref 135–145)
Total Bilirubin: 2.5 mg/dL — ABNORMAL HIGH (ref 0.3–1.2)
Total Protein: 6.1 g/dL — ABNORMAL LOW (ref 6.5–8.1)

## 2021-03-17 LAB — TROPONIN I (HIGH SENSITIVITY)
Troponin I (High Sensitivity): 60 ng/L — ABNORMAL HIGH (ref <18)
Troponin I (High Sensitivity): 69 ng/L — ABNORMAL HIGH (ref <18)

## 2021-03-17 LAB — BRAIN NATRIURETIC PEPTIDE: B Natriuretic Peptide: 4084.4 pg/mL — ABNORMAL HIGH (ref 0.0–100.0)

## 2021-03-17 NOTE — ED Notes (Signed)
Pt ambulatory to bathroom w/ no assistance.

## 2021-03-17 NOTE — ED Notes (Signed)
Patient ambulated around room, to and back from bathroom with no assistance. SPO2 maintained between 98-100%.

## 2021-03-17 NOTE — Telephone Encounter (Signed)
Advanced Heart Failure Patient Advocate Encounter  Received notification from Medicaid that prior authorization for Farxiga 5mg  is required.   PA submitted on [employer] Key W Status is pending   Will continue to follow.

## 2021-03-19 NOTE — Telephone Encounter (Signed)
Advanced Heart Failure Patient Advocate Encounter  Prior Authorization for [name] has been approved.    PA# [id number] Effective dates: 03/17/2021 - 03/17/2022  Archer Asa, CPhT

## 2021-03-22 ENCOUNTER — Other Ambulatory Visit: Payer: Self-pay

## 2021-03-22 ENCOUNTER — Other Ambulatory Visit (HOSPITAL_COMMUNITY): Payer: Self-pay | Admitting: Internal Medicine

## 2021-03-22 ENCOUNTER — Telehealth: Payer: Self-pay | Admitting: Cardiology

## 2021-03-22 NOTE — Telephone Encounter (Signed)
Pt says he needs entresto refill; pharmacy told him there is no entresto ready, it requires pre-auth from [place], he only has pre-auth for farxiga, which a separate dr had prescribed.Pt wants to know if we need to do a pre-auth for entresto, or what it is that has to be done. Please give him a call at [phone].

## 2021-03-30 ENCOUNTER — Ambulatory Visit: Payer: Medicaid Other | Admitting: Cardiology

## 2021-03-30 ENCOUNTER — Encounter: Payer: Self-pay | Admitting: Cardiology

## 2021-03-30 ENCOUNTER — Other Ambulatory Visit: Payer: Self-pay

## 2021-03-30 VITALS — BP 80/46 | HR 74 | Temp 98.0°F | Resp 16 | Ht 65.0 in | Wt 122.0 lb

## 2021-03-30 DIAGNOSIS — I5022 Chronic systolic (congestive) heart failure: Secondary | ICD-10-CM

## 2021-03-30 DIAGNOSIS — I482 Chronic atrial fibrillation, unspecified: Secondary | ICD-10-CM

## 2021-03-30 MED ORDER — METOPROLOL SUCCINATE ER 25 MG PO TB24: 25.0000 mg | ORAL_TABLET | Freq: Every day | ORAL | 3 refills | Status: DC

## 2021-03-30 MED ORDER — BIDIL 20-37.5 MG PO TABS
1.0000 | ORAL_TABLET | Freq: Three times a day (TID) | ORAL | 3 refills | Status: DC
Start: 2021-03-30 — End: 2022-06-27

## 2021-03-30 MED ORDER — APIXABAN 5 MG PO TABS: 5.0000 mg | ORAL_TABLET | Freq: Two times a day (BID) | ORAL | 3 refills | Status: DC

## 2021-03-30 MED ORDER — POTASSIUM CHLORIDE CRYS ER 20 MEQ PO TBCR: 20.0000 meq | EXTENDED_RELEASE_TABLET | Freq: Every day | ORAL | 3 refills | Status: DC

## 2021-03-30 MED ORDER — FUROSEMIDE 40 MG PO TABS: ORAL_TABLET | ORAL | 3 refills | Status: DC

## 2021-03-30 MED ORDER — SPIRONOLACTONE 25 MG PO TABS: 12.5000 mg | ORAL_TABLET | Freq: Every day | ORAL | 3 refills | Status: DC

## 2021-03-30 NOTE — Progress Notes (Signed)
Patient referred by Gary Drafts, FNP for chest pain, shortness of breath  Subjective:   Gary [name], male    DOB: 02-May-1976, 45 y.o.   MRN: [id number]   Chief Complaint  Patient presents with  . Chronic combined systolic and diastolic heart failure ([place])  . Atrial Fibrillation  . Hospitalization Follow-up    HPI  45 y.o. 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  Patient was recently in the ER without apnea after having run out of [place] for 1 week.  Work-up in the ER showed mildly elevated troponin and elevated BNP.  Since then, h he had prior authorization and also received samples for the above medications.  It is unclear to me whether he is taking Farxiga 5 mg or 10 mg.  Patient knows to call our office with this information.  Patient is now feeling better and denies any chest pain, shortness of breath, orthopnea, PND, leg symptoms.  Recent right heart catheterization performed by Dr. Haroldine Laws, details below.   Current Outpatient Medications on File Prior to Visit  Medication Sig Dispense Refill  . apixaban (ELIQUIS) 5 MG TABS tablet Take 1 tablet (5 mg total) by mouth 2 (two) times daily. 60 tablet 6  . BIDIL 20-37.5 MG tablet Take 1 tablet by mouth 3 (three) times daily. 270 tablet 3  . FARXIGA 5 MG TABS tablet TAKE 1 TABLET(5 MG) BY MOUTH DAILY BEFORE BREAKFAST 30 tablet 3  . furosemide (LASIX) 40 MG tablet Take 1 pill every morning. Take additional 1 pill in the afternoon on days with excessive shortness of breath, cough. 60 tablet 1  . metoprolol succinate (TOPROL XL) 25 MG 24 hr tablet Take 1 tablet (25 mg total) by mouth daily. 90 tablet 1  . potassium chloride SA (KLOR-CON) 20 MEQ tablet Take 1 tablet (20 mEq total) by mouth daily. 90 tablet 3  . sacubitril-valsartan (ENTRESTO)  24-26 MG Take 1 tablet by mouth 2 (two) times daily. 60 tablet 6  . spironolactone (ALDACTONE) 25 MG tablet Take 0.5 tablets (12.5 mg total) by mouth daily. 60 tablet 1   No current facility-administered medications on file prior to visit.    Cardiovascular studies:  EKG 03/30/2021: Atrial fibrillation 84 bpm Lateral T wave inversion, unchanged compared to prior  RHC 02/22/2021: Findings:   RA = 5 RV = 53/4 PA = 61/22 (39) PCW = 27 (v = 35) Fick cardiac output/index = 4.5/2.8 PVR = 2.7 WU Ao sat = 95% PA sat = 67%, 68% R SVC sat = 75%  PAPi = 7.8   Assessment: 1. Elevated filling pressures with normal outputs 2. Prominent v- wave in PCWP tracing c/w known mitral regurgitation 3. 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 Continue Transplant w/u.    EKG 02/15/2021: Atrial fibrillation 69 bpm Left ventricular hypertrophy IVCD Occasional PVC Lateral T wave inversion, consider ishcemia  Echocardiogram 03/02/2020:  Left ventricle cavity is moderately dilated. Severe concentric hypertrophy  of the left ventricle. Moderate global and severe anteroseptal  hypokinesis. LVEF 40-45%.  Diastolic function not assessed due to atrial  fibrillation and severe mitral regurgitation.    Left atrial cavity is severely dilated.  Trileaflet aortic valve.  Moderate to severe aortic regurgitation. Mild  functional aortic stenosis.  Moderate mitral  valve leaflet thickening. Severe (Grade IV) mitral  regurgitation. Mild functional mitral stenosis. Mean PG 4 mmHg at HR 56  bpm.  Moderate to severe tricuspid regurgitation. Estimated pulmonary artery  systolic pressure 52 mmHg.  Mild pulmonic regurgitation.  Noncompaction cardiomyopathy. Compared to previous study on 05/06/2019,  LVEF is marginally improved from 35-40%.    cMRI 08/2017: 1. Severely dilated left ventricular size, with normal thickness and severely decreased  systolic function (LVEF =70%). There diffuse hypokinesis and paradoxical septal motion. 2. There is no late gadolinium enhancement in the left ventricular myocardium. 2. Normal right ventricular size, thickness and mildly decreased systolic function (LVEF = 42%). There are no regional wall motion Abnormalities. 3.There are significant trabeculations in the left ventricular apex with non-compacted to compacted myocardium ratio: 2.6:1. This is consistent with non-compaction cardiomyopathy. 4. Moderately dilated left atrium and mildly dilated right atrial size. 5. Mild to moderate aortic insufficiency. Moderate mitral regurgitation with posteriorly directed jet. Mild tricuspid regurgitation. No significant pulmonary regurgitation is seen.   EKG 01/31/2019: Atrial fibrillation with controlled ventricular response  Voltage criteria for LVH  T wave inversion abnormality  -Possible  Inferior and lateral  ischemia.     Recent labs: 03/16/2021: Glucose 142, BUN/Cr 22/1.04. EGFR >60. Na/K 135/3.6. T.bili 2.5. Total protein 6.1. Rest of the CMP normal H/H 12/36. MCV 89. Platelets 138  Results for ATHOL, BOLDS (MRN [id number]) as of 03/30/2021 13:05  Ref. Range 03/16/2021 23:45 03/17/2021 01:28  B Natriuretic Peptide Latest Ref Range: 0.0 - 100.0 pg/mL 4,084.4 (H)   Troponin I (High Sensitivity) Latest Ref Range: <18 ng/L 69 (H) 60 (H)    08/03/2020: Glucose 138, BUN/Cr 11/0.91. EGFR >60. Na/K 135/3.8.  H/H 12/37. MCV 89. Platelets 155 BNP 913  12/31/2019: Glucose 94, BUN/Cr 19/1.1. EGFR 81. Na/K 135/3.5. Rest of the CMP normal   Review of Systems  Cardiovascular:  Negative for chest pain, dyspnea on exertion, leg swelling, palpitations and syncope.        Objective:     Today's Vitals   03/30/21 1258 03/30/21 1304  BP: (!) 89/46 (!) 80/46  Pulse: 82 74  Resp: 16   Temp: 98 F (36.7 C)   SpO2: 97%   Weight: 122 lb (55.3 kg)   Height: _0  (1.651 m)    Body mass index is  20.3 kg/m.   Filed Weights   03/30/21 1258  Weight: 122 lb (55.3 kg)    Physical Exam Vitals and nursing note reviewed.  Constitutional:      General: He is not in acute distress.    Appearance: He is underweight.  Neck:     Vascular: No JVD.  Cardiovascular:     Rate and Rhythm: Normal rate. Rhythm irregular.     Heart sounds: Normal heart sounds. No murmur heard. Pulmonary:     Effort: Pulmonary effort is normal.     Breath sounds: Normal breath sounds. No wheezing or rales.  Musculoskeletal:     Right lower leg: No edema.     Left lower leg: No edema.         Assessment & Recommendations:   45 y.o. 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  Noncompaction cardiomyopathy: NYHA I-II. BP soft but at baseline without any complaints. Patient is currently being evaluated for heart transplant and at [facility].  Emphasized importance of medication compliance.  Encouraged him to call our office if he ever is danger of running out of his medications. Doing well on Entresto 24-26 mg twice daily, BiDil 1 tab 3 times daily, metoprolol succinate 25 mg daily, spironolactone 12.5 mg daily, Farxiga ?  5 or 10 mg daily.  Patient will confirm what dose he is currently taking.  On a separate note, he asked me today MitraClip would be an option.  On recent heart catheterization, his cardiac appointment was abnormal, with which pressure including pressures are elevated.  I will discuss with Dr. Haroldine Laws if MitraClip alone would benefit the patient.   Aortic and mitral regurgitation: Moderate, clinically stable  Permanent atrial fibrillation: Rate controlled. CHA2DSVasc score 1 with annual  Stroke risk 0.6%. However, given his compaction cardiomyopathy, his stroke risk is higher.  Continue anticoagulation  with apixaban 5 mg bid.   F/u w/me in 3 months   Marshel Golubski Esther Hardy, MD [facility] [place] Pager: [phone] Office: [phone] If no answer Cell [phone]

## 2021-06-28 ENCOUNTER — Encounter: Payer: Self-pay | Admitting: Cardiology

## 2021-06-28 ENCOUNTER — Other Ambulatory Visit: Payer: Self-pay

## 2021-06-28 ENCOUNTER — Ambulatory Visit: Payer: Medicaid Other | Admitting: Cardiology

## 2021-06-28 VITALS — BP 87/39 | HR 50 | Temp 98.2°F | Resp 16 | Ht 65.0 in | Wt 122.0 lb

## 2021-06-28 DIAGNOSIS — I4821 Permanent atrial fibrillation: Secondary | ICD-10-CM

## 2021-06-28 DIAGNOSIS — I5022 Chronic systolic (congestive) heart failure: Secondary | ICD-10-CM

## 2021-06-28 DIAGNOSIS — I428 Other cardiomyopathies: Secondary | ICD-10-CM

## 2021-06-28 MED ORDER — SPIRONOLACTONE 25 MG PO TABS: 12.5000 mg | ORAL_TABLET | Freq: Every day | ORAL | 3 refills | Status: DC

## 2021-06-28 NOTE — Progress Notes (Signed)
Patient referred by [name], FNP for chest pain, shortness of breath  Subjective:   Gary [name], male    DOB: 12/21/1975, 45 y.o.   MRN: [id number]   Chief Complaint  Patient presents with   Congestive Heart Failure   Follow-up    3 month    HPI  45 y.o. 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  Patient is doing well.  His dyspnea is well controlled, does not occur with regular physical activity.  He does not have leg swelling.  He has regular follow-up with Dr. Haroldine Laws as well as [facility].  Given his stability of symptoms, he is being monitored without any imminent plans for transplant.  Current Outpatient Medications on File Prior to Visit  Medication Sig Dispense Refill   apixaban (ELIQUIS) 5 MG TABS tablet Take 1 tablet (5 mg total) by mouth 2 (two) times daily. 180 tablet 3   BIDIL 20-37.5 MG tablet Take 1 tablet by mouth 3 (three) times daily. 270 tablet 3   FARXIGA 5 MG TABS tablet TAKE 1 TABLET(5 MG) BY MOUTH DAILY BEFORE BREAKFAST 30 tablet 3   furosemide (LASIX) 40 MG tablet Take 1 pill every morning. Take additional 1 pill in the afternoon on days with excessive shortness of breath, cough. 90 tablet 3   metoprolol succinate (TOPROL XL) 25 MG 24 hr tablet Take 1 tablet (25 mg total) by mouth daily. 90 tablet 3   potassium chloride SA (KLOR-CON) 20 MEQ tablet Take 1 tablet (20 mEq total) by mouth daily. 90 tablet 3   sacubitril-valsartan (ENTRESTO) 24-26 MG Take 1 tablet by mouth 2 (two) times daily. 60 tablet 6   spironolactone (ALDACTONE) 25 MG tablet Take 0.5 tablets (12.5 mg total) by mouth daily. 90 tablet 3   No current facility-administered medications on file prior to visit.    Cardiovascular studies:  EKG 03/30/2021: Atrial fibrillation 84 bpm Lateral T wave  inversion, unchanged compared to prior  RHC 02/22/2021: Findings:   RA = 5 RV = 53/4 PA = 61/22 (39) PCW = 27 (v = 35) Fick cardiac output/index = 4.5/2.8 PVR = 2.7 WU Ao sat = 95% PA sat = 67%, 68% R SVC sat = 75%  PAPi = 7.8   Assessment: 1. Elevated filling pressures with normal outputs 2. Prominent v- wave in PCWP tracing c/w known mitral regurgitation 3. 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 Continue Transplant w/u.    EKG 02/15/2021: Atrial fibrillation 69 bpm Left ventricular hypertrophy IVCD Occasional PVC Lateral T wave inversion, consider ishcemia  Echocardiogram 03/02/2020:  Left ventricle cavity is moderately dilated. Severe concentric hypertrophy  of the left ventricle. Moderate global and severe anteroseptal  hypokinesis. LVEF 40-45%.  Diastolic function not assessed due to atrial  fibrillation and severe mitral regurgitation.    Left atrial cavity is severely dilated.  Trileaflet aortic valve.  Moderate to severe aortic regurgitation. Mild  functional aortic stenosis.  Moderate mitral valve leaflet thickening. Severe (Grade IV) mitral  regurgitation. Mild functional mitral stenosis. Mean PG 4 mmHg at HR 56  bpm.  Moderate to severe tricuspid regurgitation. Estimated pulmonary artery  systolic pressure 52 mmHg.  Mild pulmonic regurgitation.  Noncompaction cardiomyopathy. Compared to previous study on 05/06/2019,  LVEF is marginally improved  from 35-40%.    cMRI 08/2017: 1. Severely dilated left ventricular size, with normal thickness and severely decreased systolic function (LVEF =03%). There diffuse hypokinesis and paradoxical septal motion. 2. There is no late gadolinium enhancement in the left ventricular myocardium. 2. Normal right ventricular size, thickness and mildly decreased systolic function (LVEF = 42%). There are no regional wall motion Abnormalities. 3.There are  significant trabeculations in the left ventricular apex with non-compacted to compacted myocardium ratio: 2.6:1. This is consistent with non-compaction cardiomyopathy. 4. Moderately dilated left atrium and mildly dilated right atrial size. 5. Mild to moderate aortic insufficiency. Moderate mitral regurgitation with posteriorly directed jet. Mild tricuspid regurgitation. No significant pulmonary regurgitation is seen.   EKG 01/31/2019: Atrial fibrillation with controlled ventricular response  Voltage criteria for LVH  T wave inversion abnormality  -Possible  Inferior and lateral  ischemia.     Recent labs: 03/16/2021: Glucose 142, BUN/Cr 22/1.04. EGFR >60. Na/K 135/3.6. T.bili 2.5. Total protein 6.1. Rest of the CMP normal H/H 12/36. MCV 89. Platelets 138  Results for TALIK, CASIQUE (MRN [id number]) as of 03/30/2021 13:05  Ref. Range 03/16/2021 23:45 03/17/2021 01:28  B Natriuretic Peptide Latest Ref Range: 0.0 - 100.0 pg/mL 4,084.4 (H)   Troponin I (High Sensitivity) Latest Ref Range: <18 ng/L 69 (H) 60 (H)    08/03/2020: Glucose 138, BUN/Cr 11/0.91. EGFR >60. Na/K 135/3.8.  H/H 12/37. MCV 89. Platelets 155 BNP 913  12/31/2019: Glucose 94, BUN/Cr 19/1.1. EGFR 81. Na/K 135/3.5. Rest of the CMP normal   Review of Systems  Cardiovascular:  Negative for chest pain, dyspnea on exertion, leg swelling, palpitations and syncope.        Objective:     Today's Vitals   06/28/21 1246  BP: (!) 87/39  Pulse: (!) 50  Resp: 16  Temp: 98.2 F (36.8 C)  SpO2: 95%  Weight: 122 lb (55.3 kg)  Height: '5\' 5"'  (1.651 m)   Body mass index is 20.3 kg/m.   Filed Weights   06/28/21 1246  Weight: 122 lb (55.3 kg)    Physical Exam Vitals and nursing note reviewed.  Constitutional:      General: He is not in acute distress.    Appearance: He is underweight.  Neck:     Vascular: No JVD.  Cardiovascular:     Rate and Rhythm: Normal rate. Rhythm irregular.     Heart sounds: Normal  heart sounds. No murmur heard. Pulmonary:     Effort: Pulmonary effort is normal.     Breath sounds: Normal breath sounds. No wheezing or rales.  Musculoskeletal:     Right lower leg: No edema.     Left lower leg: No edema.         Assessment & Recommendations:   45 y.o. 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  Noncompaction cardiomyopathy: NYHA I-II. BP soft but at baseline without any complaints. Patient is currently being evaluated for heart transplant and at [facility].   Emphasized importance of medication compliance.  Encouraged him to call our office if he ever is danger of running out of his medications. Doing well on Entresto 24-26 mg twice daily, BiDil 1 tab 3 times daily, metoprolol succinate 25 mg daily, spironolactone 12.5 mg daily (refilled today), Farxiga 5 mg daily.   Aortic and mitral regurgitation: Moderate, clinically stable  Permanent atrial fibrillation: Rate controlled. CHA2DSVasc score 1 with annual  Stroke risk 0.6%. However, given his compaction cardiomyopathy, his stroke risk is higher.  Continue anticoagulation with apixaban 5 mg bid.   F/u in 6 months   Cassanda Walmer Esther Hardy, MD [facility] Pager: [phone] Office: [phone] If no answer Cell [phone]

## 2021-07-11 ENCOUNTER — Ambulatory Visit (HOSPITAL_COMMUNITY)
Admission: EM | Admit: 2021-07-11 | Discharge: 2021-07-11 | Disposition: A | Discharge status: Home / Self Care | Payer: Medicaid Other | Attending: Student | Admitting: Student

## 2021-07-11 ENCOUNTER — Ambulatory Visit (INDEPENDENT_AMBULATORY_CARE_PROVIDER_SITE_OTHER): Payer: Medicaid Other

## 2021-07-11 ENCOUNTER — Encounter (HOSPITAL_COMMUNITY): Payer: Self-pay | Admitting: *Deleted

## 2021-07-11 ENCOUNTER — Other Ambulatory Visit: Payer: Self-pay

## 2021-07-11 DIAGNOSIS — Z87891 Personal history of nicotine dependence: Secondary | ICD-10-CM

## 2021-07-11 DIAGNOSIS — J069 Acute upper respiratory infection, unspecified: Secondary | ICD-10-CM | POA: Diagnosis not present

## 2021-07-11 DIAGNOSIS — R059 Cough, unspecified: Secondary | ICD-10-CM | POA: Diagnosis not present

## 2021-07-11 DIAGNOSIS — Z7901 Long term (current) use of anticoagulants: Secondary | ICD-10-CM | POA: Diagnosis not present

## 2021-07-11 MED ORDER — ALBUTEROL SULFATE HFA 108 (90 BASE) MCG/ACT IN AERS: 1.0000 | INHALATION_SPRAY | Freq: Four times a day (QID) | RESPIRATORY_TRACT | 0 refills | Status: DC | PRN

## 2021-07-11 MED ORDER — PREDNISONE 20 MG PO TABS: 20.0000 mg | ORAL_TABLET | Freq: Every day | ORAL | 0 refills | Status: AC

## 2021-07-11 NOTE — ED Triage Notes (Signed)
Pt reports he had a cold and now has a lingering cough. The cough is worse at night.

## 2021-07-11 NOTE — Discharge Instructions (Addendum)
-  Prednisone one pill daily for 5 days, I recommend taking this in the morning as it can give you energy. -Albuterol inhaler as needed for cough, wheezing, shortness of breath, 1 to 2 puffs every 6 hours as needed. -Your x-ray looks okay today, but I am concerned about the blood in your sputum, please call your primary care provider to discuss this further.

## 2021-07-11 NOTE — ED Provider Notes (Addendum)
[facility]    CSN: [id number] Arrival date & time: 07/11/21  1047      History   Chief Complaint Chief Complaint  Patient presents with   Cough    HPI Gary [name] is a 46 y.o. male presenting with lingering cough following virus.  Medical history former smoker, pneumonia, CHF, A. fib, malnutrition, long-term anticoagulation.  States that he was sick with a virus, including cough, congestion, sore throat for about 5 days.  Symptoms improved overall, but now cough is productive of purulent green and bloody sputum.  States typically only a small amount of blood in the sputum, though there have been large amounts occasionally.  Denies shortness of breath, chest pain, dizziness, weakness, fever/chills.  Denies history of pulmonary disease, he is not on any inhalers at baseline.  Has been using an over-the-counter cough medicine with some relief.  HPI  Past Medical History:  Diagnosis Date   Acute on chronic combined systolic and diastolic CHF, NYHA class 3 ([place]) 01/11/2019   Acute on chronic congestive heart failure (HCC)    Acute on chronic systolic and diastolic heart failure, NYHA class 1 ([place]) 02/17/2019   Atrial fibrillation (HCC)    CHF (congestive heart failure) ([place])    Chronic atrial fibrillation ([place]) 99991111   Chronic systolic heart failure ([place]) 05/16/2019   Dysrhythmia    hx. of  A-fib   Former smoker    Quit   Hyperglycemia 01/11/2019   Hypertension    Incarcerated inguinal hernia 10/13/2017   Internal bleeding    DUE TO ASPIRIN, OR MEDS   Multifocal pneumonia 01/11/2019   Murmur    Noncompaction cardiomyopathy ([place]) 01/31/2019   Protein-calorie malnutrition, severe 10/17/2017   Severe mitral regurgitation 01/31/2019    Patient Active Problem List   Diagnosis Date Noted   Chronic systolic heart failure ([place]) 05/16/2019   Acute on chronic congestive heart failure (HCC)    Acute on chronic systolic and diastolic heart failure, NYHA class 1 ([place]) 02/17/2019    Atrial fibrillation ([employer]) 01/31/2019   Noncompaction cardiomyopathy ([place]) 01/31/2019   Severe mitral regurgitation 01/31/2019   Acute on chronic combined systolic and diastolic CHF, NYHA class 3 ([place]) 01/11/2019   Multifocal pneumonia 01/11/2019   Hyperglycemia 01/11/2019   Former smoker    Chronic atrial fibrillation ([place]) 10/27/2018   Protein-calorie malnutrition, severe 10/17/2017   Incarcerated inguinal hernia 10/13/2017    Past Surgical History:  Procedure Laterality Date   ABDOMINAL SURGERY     ABDOMINAL SURGERY     under 10 yrs. old or younger   [place] N/A 08/17/2017   Procedure: CARDIOVERSION;  Surgeon: Dixie Dials, MD;  Location: [place];  Service: Cardiovascular;  Laterality: N/A;   HERNIA REPAIR     INGUINAL HERNIA REPAIR Right 10/13/2017   Procedure: [place];  Surgeon: Judeth Horn, MD;  Location: [place];  Service: General;  Laterality: Right;   OPEN HEART SURGERY     AS A CHILD , at 2 yrs. old. done in [place] CATH N/A 02/22/2021   Procedure: RIGHT HEART CATH;  Surgeon: Jolaine Artist, MD;  Location: [place] [facility];  Service: Cardiovascular;  Laterality: N/A;   RIGHT/LEFT HEART CATH AND CORONARY ANGIOGRAPHY N/A 02/19/2019   Procedure: RIGHT/LEFT HEART CATH AND CORONARY ANGIOGRAPHY;  Surgeon: [name], MD;  Location: [place] [facility];  Service: Cardiovascular;  Laterality: N/A;  TEE WITHOUT CARDIOVERSION N/A 08/17/2017   Procedure: TRANSESOPHAGEAL ECHOCARDIOGRAM (TEE);  Surgeon: Dixie Dials, MD;  Location: [facility] ENDOSCOPY;  Service: Cardiovascular;  Laterality: N/A;   TEE WITHOUT CARDIOVERSION N/A 02/19/2019   Procedure: TRANSESOPHAGEAL ECHOCARDIOGRAM (TEE);  Surgeon: [name], MD;  Location: [facility] ENDOSCOPY;  Service: Cardiovascular;  Laterality: N/A;       Home Medications    Prior to Admission medications   Medication Sig Start Date End Date Taking?  Authorizing Provider  albuterol (VENTOLIN HFA) 108 (90 Base) MCG/ACT inhaler Inhale 1-2 puffs into the lungs every 6 (six) hours as needed for wheezing or shortness of breath. 07/11/21  Yes Hazel Sams, PA-C  predniSONE (DELTASONE) 20 MG tablet Take 1 tablet (20 mg total) by mouth daily for 5 days. 07/11/21 07/16/21 Yes Hazel Sams, PA-C  apixaban (ELIQUIS) 5 MG TABS tablet Take 1 tablet (5 mg total) by mouth 2 (two) times daily. 03/30/21   Patwardhan, Manish J, MD  BIDIL 20-37.5 MG tablet Take 1 tablet by mouth 3 (three) times daily. 03/30/21   Patwardhan, Manish J, MD  FARXIGA 5 MG TABS tablet TAKE 1 TABLET(5 MG) BY MOUTH DAILY BEFORE BREAKFAST 03/22/21   Bensimhon, Shaune Pascal, MD  furosemide (LASIX) 40 MG tablet Take 1 pill every morning. Take additional 1 pill in the afternoon on days with excessive shortness of breath, cough. 03/30/21   Patwardhan, Reynold Bowen, MD  metoprolol succinate (TOPROL XL) 25 MG 24 hr tablet Take 1 tablet (25 mg total) by mouth daily. 03/30/21 03/30/22  Patwardhan, Reynold Bowen, MD  Multiple Vitamin (MULTIVITAMIN) tablet Take 1 tablet by mouth daily.    [provider]  potassium chloride SA (KLOR-CON) 20 MEQ tablet Take 1 tablet (20 mEq total) by mouth daily. 03/30/21   Patwardhan, Reynold Bowen, MD  sacubitril-valsartan (ENTRESTO) 24-26 MG Take 1 tablet by mouth 2 (two) times daily. 03/10/21   Patwardhan, Reynold Bowen, MD  spironolactone (ALDACTONE) 25 MG tablet Take 0.5 tablets (12.5 mg total) by mouth daily. 06/28/21   Patwardhan, Reynold Bowen, MD    Family History Family History  Problem Relation Age of Onset   Sarcoidosis Mother    Healthy Father     Social History Social History   Tobacco Use   Smoking status: Former    Packs/day: 1.00    Years: 20.00    Pack years: 20.00    Types: Cigarettes    Quit date: 09/09/2018    Years since quitting: 2.8   Smokeless tobacco: Never  Vaping Use   Vaping Use: Never used  Substance Use Topics   Alcohol use: Yes    Comment: occ    Drug use: Not Currently    Types: Marijuana     Allergies   Farxiga [dapagliflozin] and Aspirin   Review of Systems Review of Systems  Constitutional:  Negative for appetite change, chills and fever.  HENT:  Positive for congestion. Negative for ear pain, rhinorrhea, sinus pressure, sinus pain and sore throat.   Eyes:  Negative for redness and visual disturbance.  Respiratory:  Positive for cough. Negative for chest tightness, shortness of breath and wheezing.   Cardiovascular:  Negative for chest pain and palpitations.  Gastrointestinal:  Negative for abdominal pain, constipation, diarrhea, nausea and vomiting.  Genitourinary:  Negative for dysuria, frequency and urgency.  Musculoskeletal:  Negative for myalgias.  Neurological:  Negative for dizziness, weakness and headaches.  Psychiatric/Behavioral:  Negative for confusion.   All other systems reviewed and are negative.  Physical Exam Triage Vital Signs ED Triage Vitals  Enc Vitals Group     BP 07/11/21 1303 (!) 93/50     Pulse Rate 07/11/21 1303 91     Resp 07/11/21 1303 18     Temp 07/11/21 1303 98.6 F (37 C)     Temp src --      SpO2 07/11/21 1303 96 %     Weight --      Height --      Head Circumference --      Peak Flow --      Pain Score 07/11/21 1301 0     Pain Loc --      Pain Edu? --      Excl. in [place]? --    No data found.  Updated Vital Signs BP (!) 93/50    Pulse 91    Temp 98.6 F (37 C)    Resp 18    SpO2 96%   Visual Acuity Right Eye Distance:   Left Eye Distance:   Bilateral Distance:    Right Eye Near:   Left Eye Near:    Bilateral Near:     Physical Exam Vitals reviewed.  Constitutional:      General: He is not in acute distress.    Appearance: Normal appearance. He is underweight. He is not ill-appearing.  HENT:     Head: Normocephalic and atraumatic.     Right Ear: Tympanic membrane, ear canal and external ear normal. No tenderness. No middle ear effusion. There is no impacted  cerumen. Tympanic membrane is not perforated, erythematous, retracted or bulging.     Left Ear: Tympanic membrane, ear canal and external ear normal. No tenderness.  No middle ear effusion. There is no impacted cerumen. Tympanic membrane is not perforated, erythematous, retracted or bulging.     Nose: Nose normal. No congestion.     Mouth/Throat:     Mouth: Mucous membranes are moist.     Pharynx: Uvula midline. No oropharyngeal exudate or posterior oropharyngeal erythema.  Eyes:     Extraocular Movements: Extraocular movements intact.     Pupils: Pupils are equal, round, and reactive to light.  Cardiovascular:     Rate and Rhythm: Normal rate and regular rhythm.     Heart sounds: Normal heart sounds.  Pulmonary:     Effort: Pulmonary effort is normal.     Breath sounds: Rales present. No decreased breath sounds, wheezing or rhonchi.     Comments: Scattered rales lower lung fields Abdominal:     Palpations: Abdomen is soft.     Tenderness: There is no abdominal tenderness. There is no guarding or rebound.  Lymphadenopathy:     Cervical: No cervical adenopathy.     Right cervical: No superficial cervical adenopathy.    Left cervical: No superficial cervical adenopathy.  Neurological:     General: No focal deficit present.     Mental Status: He is alert and oriented to person, place, and time.  Psychiatric:        Mood and Affect: Mood normal.        Behavior: Behavior normal.        Thought Content: Thought content normal.        Judgment: Judgment normal.     UC Treatments / Results  Labs (all labs ordered are listed, but only abnormal results are displayed) Labs Reviewed - No data to display  EKG   Radiology DG Chest 2 View  Result Date: 07/11/2021  CLINICAL DATA:  Cough for 2 days. EXAM: CHEST - 2 VIEW COMPARISON:  March 16, 2021 FINDINGS: The mediastinal contour is normal. The heart size is enlarged. Small calcified granuloma is identified in the right lower lobe  unchanged. Both lungs are otherwise clear. Scoliosis of spine noted. IMPRESSION: No active cardiopulmonary disease. Electronically Signed   By: Abelardo Diesel M.D.   On: 07/11/2021 13:41    Procedures Procedures (including critical care time)  Medications Ordered in UC Medications - No data to display  Initial Impression / Assessment and Plan / UC Course  I have reviewed the triage vital signs and the nursing notes.  Pertinent labs & imaging results that were available during my care of the patient were reviewed by me and considered in my medical decision making (see chart for details).     This patient is a very pleasant 46 y.o. year old male presenting with viral URI. Afebrile, nontachy. Former smoker, denies pulm ds. No inhalers at baseline. For A. fib, continue anticoagulation.  Symptoms x5 days, out of antiviral and tamiflu window, did not send covid or influenza PCR.  Per chart review, blood pressure is hypotensive range at baseline, today denies dizziness, shortness of breath, chest pain, weakness, headaches, vision changes.  CXR - No active cardiopulmonary disease.  Will manage with albuterol, low-dose prednisone 20mg  x5 days. F/u with PCP and cardiology given bloody sputum.  ED return precautions discussed. Patient verbalizes understanding and agreement.   Discussed treatment plan with attending physician Dr. Windy Carina who is in agreement.    Final Clinical Impressions(s) / UC Diagnoses   Final diagnoses:  Long term current use of anticoagulant therapy  Former smoker  Viral URI     Discharge Instructions      -Prednisone one pill daily for 5 days, I recommend taking this in the morning as it can give you energy. -Albuterol inhaler as needed for cough, wheezing, shortness of breath, 1 to 2 puffs every 6 hours as needed. -Your x-ray looks okay today, but I am concerned about the blood in your sputum, please call your primary care provider to discuss this  further.     ED Prescriptions     Medication Sig Dispense Auth. Provider   predniSONE (DELTASONE) 20 MG tablet Take 1 tablet (20 mg total) by mouth daily for 5 days. 5 tablet Hazel Sams, PA-C   albuterol (VENTOLIN HFA) 108 (90 Base) MCG/ACT inhaler Inhale 1-2 puffs into the lungs every 6 (six) hours as needed for wheezing or shortness of breath. 1 each Hazel Sams, PA-C      PDMP not reviewed this encounter.   Hazel Sams, PA-C 07/11/21 1348    Hazel Sams, PA-C 07/11/21 1355

## 2021-07-13 ENCOUNTER — Emergency Department (HOSPITAL_COMMUNITY): Payer: Medicaid Other

## 2021-07-13 ENCOUNTER — Encounter (HOSPITAL_COMMUNITY): Payer: Self-pay | Admitting: *Deleted

## 2021-07-13 ENCOUNTER — Emergency Department (HOSPITAL_COMMUNITY)
Admission: EM | Admit: 2021-07-13 | Discharge: 2021-07-13 | Disposition: A | Discharge status: Home / Self Care | Payer: Medicaid Other | Attending: Emergency Medicine | Admitting: Emergency Medicine

## 2021-07-13 ENCOUNTER — Other Ambulatory Visit: Payer: Self-pay

## 2021-07-13 DIAGNOSIS — R059 Cough, unspecified: Secondary | ICD-10-CM | POA: Diagnosis present

## 2021-07-13 DIAGNOSIS — Z7901 Long term (current) use of anticoagulants: Secondary | ICD-10-CM | POA: Insufficient documentation

## 2021-07-13 DIAGNOSIS — J189 Pneumonia, unspecified organism: Secondary | ICD-10-CM | POA: Insufficient documentation

## 2021-07-13 LAB — CBC WITH DIFFERENTIAL/PLATELET
Abs Immature Granulocytes: 0.03 10*3/uL (ref 0.00–0.07)
Basophils Absolute: 0 10*3/uL (ref 0.0–0.1)
Basophils Relative: 0 %
Eosinophils Absolute: 0 10*3/uL (ref 0.0–0.5)
Eosinophils Relative: 0 %
HCT: 38.6 % — ABNORMAL LOW (ref 39.0–52.0)
Hemoglobin: 13.1 g/dL (ref 13.0–17.0)
Immature Granulocytes: 0 %
Lymphocytes Relative: 5 %
Lymphs Abs: 0.4 10*3/uL — ABNORMAL LOW (ref 0.7–4.0)
MCH: 31.1 pg (ref 26.0–34.0)
MCHC: 33.9 g/dL (ref 30.0–36.0)
MCV: 91.7 fL (ref 80.0–100.0)
Monocytes Absolute: 0.3 10*3/uL (ref 0.1–1.0)
Monocytes Relative: 3 %
Neutro Abs: 7.7 10*3/uL (ref 1.7–7.7)
Neutrophils Relative %: 92 %
Platelets: 188 10*3/uL (ref 150–400)
RBC: 4.21 MIL/uL — ABNORMAL LOW (ref 4.22–5.81)
RDW: 12.4 % (ref 11.5–15.5)
WBC: 8.5 10*3/uL (ref 4.0–10.5)
nRBC: 0 % (ref 0.0–0.2)

## 2021-07-13 LAB — BASIC METABOLIC PANEL
Anion gap: 3 — ABNORMAL LOW (ref 5–15)
BUN: 13 mg/dL (ref 6–20)
CO2: 23 mmol/L (ref 22–32)
Calcium: 8.5 mg/dL — ABNORMAL LOW (ref 8.9–10.3)
Chloride: 107 mmol/L (ref 98–111)
Creatinine, Ser: 0.84 mg/dL (ref 0.61–1.24)
GFR, Estimated: >60 mL/min (ref >60)
Glucose, Bld: 128 mg/dL — ABNORMAL HIGH (ref 70–99)
Potassium: 4.7 mmol/L (ref 3.5–5.1)
Sodium: 133 mmol/L — ABNORMAL LOW (ref 135–145)

## 2021-07-13 MED ORDER — AMOXICILLIN-POT CLAVULANATE 875-125 MG PO TABS
1.0000 | ORAL_TABLET | Freq: Once | ORAL | Status: AC
  Administered 2021-07-13: 1 via ORAL
  Filled 2021-07-13: qty 1

## 2021-07-13 MED ORDER — GUAIFENESIN-DM 100-10 MG/5ML PO SYRP
5.0000 mL | ORAL_SOLUTION | ORAL | Status: DC | PRN
  Administered 2021-07-13: 5 mL via ORAL
  Filled 2021-07-13: qty 10

## 2021-07-13 MED ORDER — AMOXICILLIN-POT CLAVULANATE 875-125 MG PO TABS: 1.0000 | ORAL_TABLET | Freq: Two times a day (BID) | ORAL | 0 refills | Status: DC

## 2021-07-13 NOTE — Discharge Instructions (Addendum)
The testing indicates that you have a left lower lung pneumonia.  We are starting you on an antibiotic to treat that.  To help your cough, take Robitussin-DM every 4 hours.  Make sure that you are eating and drinking regularly.  Consider following up with your heart failure doctor for further problems with coughing or if you are not better in a few days.

## 2021-07-13 NOTE — ED Provider Notes (Signed)
Emergency Medicine Provider Triage Evaluation Note  Gary [name] , a 45 y.o. male  was evaluated in triage.  Pt complains of congestion and cough. Pt seen at [facility] on 07/11/21 with negative COVID test, CXR clear at that time. Pt was started on prednisone, but states symptoms seem to be worsening. He has taken robitussin without relief. Currently takes metoprolol, eliquis  Review of Systems  Positive: Congestion, cough Negative: fevers, chills, sore throat, chest pain, SOB, .  Physical Exam  BP 122/60 (BP Location: Right Arm)    Pulse 91    Temp 98.2 F (36.8 C) (Oral)    Resp 18    Ht 5\' 5"  (1.651 m)    Wt 55.4 kg    SpO2 98%    BMI 20.32 kg/m  Gen:   Awake, no distress   Resp:  Normal effort lungs CTA BL  MSK:   Moves extremities without difficulty  Other:    Medical Decision Making  Medically screening exam initiated at 12:21 PM.  Appropriate orders placed.  Gary [name] was informed that the remainder of the evaluation will be completed by another provider, this initial triage assessment does not replace that evaluation, and the importance of remaining in the ED until their evaluation is complete.     Tonye Pearson, [place] 07/13/21 1223    Regan Lemming, MD 07/13/21 1451

## 2021-07-13 NOTE — ED Triage Notes (Signed)
Cough that is persistent, seen at [facility] and was neg for covid, started on Prednisone.

## 2021-07-13 NOTE — ED Provider Notes (Signed)
[facility]-EMERGENCY DEPT Provider Note   CSN: [id number] Arrival date & time: 07/13/21  1055     History  Chief Complaint  Patient presents with   Cough    Gary [name] is a 46 y.o. male.  HPI Presents for evaluation of cough, ongoing for about a week.  He is currently being treated with prednisone for suspected viral infection.  He has had COVID vaccines but not flu vaccine.  He denies chest pain, shortness of breath, nausea, vomiting, weakness or dizziness.  He has not a smoker.    Home Medications Prior to Admission medications   Medication Sig Start Date End Date Taking? Authorizing Provider  amoxicillin-clavulanate (AUGMENTIN) 875-125 MG tablet Take 1 tablet by mouth 2 (two) times daily. One po bid x 7 days 07/13/21  Yes Mancel Bale, MD  albuterol (VENTOLIN HFA) 108 (90 Base) MCG/ACT inhaler Inhale 1-2 puffs into the lungs every 6 (six) hours as needed for wheezing or shortness of breath. 07/11/21   Rhys Martini, PA-C  apixaban (ELIQUIS) 5 MG TABS tablet Take 1 tablet (5 mg total) by mouth 2 (two) times daily. 03/30/21   Patwardhan, Manish J, MD  BIDIL 20-37.5 MG tablet Take 1 tablet by mouth 3 (three) times daily. 03/30/21   Patwardhan, Manish J, MD  FARXIGA 5 MG TABS tablet TAKE 1 TABLET(5 MG) BY MOUTH DAILY BEFORE BREAKFAST 03/22/21   Bensimhon, Bevelyn Buckles, MD  furosemide (LASIX) 40 MG tablet Take 1 pill every morning. Take additional 1 pill in the afternoon on days with excessive shortness of breath, cough. 03/30/21   Patwardhan, Anabel Bene, MD  metoprolol succinate (TOPROL XL) 25 MG 24 hr tablet Take 1 tablet (25 mg total) by mouth daily. 03/30/21 03/30/22  Patwardhan, Anabel Bene, MD  Multiple Vitamin (MULTIVITAMIN) tablet Take 1 tablet by mouth daily.    [provider]  potassium chloride SA (KLOR-CON) 20 MEQ tablet Take 1 tablet (20 mEq total) by mouth daily. 03/30/21   Patwardhan, Anabel Bene, MD  predniSONE (DELTASONE) 20 MG tablet Take 1 tablet (20  mg total) by mouth daily for 5 days. 07/11/21 07/16/21  Rhys Martini, PA-C  sacubitril-valsartan (ENTRESTO) 24-26 MG Take 1 tablet by mouth 2 (two) times daily. 03/10/21   Patwardhan, Anabel Bene, MD  spironolactone (ALDACTONE) 25 MG tablet Take 0.5 tablets (12.5 mg total) by mouth daily. 06/28/21   Patwardhan, Anabel Bene, MD      Allergies    Farxiga [dapagliflozin] and Aspirin    Review of Systems   Review of Systems  Physical Exam Updated Vital Signs BP 98/75    Pulse 95    Temp 98.2 F (36.8 C) (Oral)    Resp 17    Ht 5\' 5"  (1.651 m)    Wt 55.4 kg    SpO2 96%    BMI 20.32 kg/m  Physical Exam  ED Results / Procedures / Treatments   Labs (all labs ordered are listed, but only abnormal results are displayed) Labs Reviewed  BASIC METABOLIC PANEL - Abnormal; Notable for the following components:      Result Value   Sodium 133 (*)    Glucose, Bld 128 (*)    Calcium 8.5 (*)    Anion gap 3 (*)    All other components within normal limits  CBC WITH DIFFERENTIAL/PLATELET - Abnormal; Notable for the following components:   RBC 4.21 (*)    HCT 38.6 (*)    Lymphs Abs 0.4 (*)  All other components within normal limits    EKG EKG Interpretation  Date/Time:  Tuesday July 13 2021 21:16:26 EST Ventricular Rate:  101 PR Interval:    QRS Duration: 116 QT Interval:  377 QTC Calculation: 489 R Axis:   59 Text Interpretation: Atrial fibrillation Multiple ventricular premature complexes LVH with IVCD and secondary repol abnrm Borderline prolonged QT interval Since last tracing of earlier today No significant change was found Confirmed by Mancel Bale [id number]) on 07/13/2021 10:33:07 PM  Radiology DG Chest 2 View  Result Date: 07/13/2021 CLINICAL DATA:  Chest congestion and cough EXAM: CHEST - 2 VIEW COMPARISON:  07/21/2021 FINDINGS: Chronically enlarged cardiac silhouette. Right lung remains clear. Worsening of patchy pneumonia throughout the left lower lung. No dense consolidation or  collapse. Small amount of pleural fluid on the left. IMPRESSION: Worsening of pneumonia within the left lower lobe. Electronically Signed   By: Paulina Fusi M.D.   On: 07/13/2021 12:51    Procedures Procedures    Medications Ordered in ED Medications  guaiFENesin-dextromethorphan (ROBITUSSIN DM) 100-10 MG/5ML syrup 5 mL (5 mLs Oral Given 07/13/21 2234)  amoxicillin-clavulanate (AUGMENTIN) 875-125 MG per tablet 1 tablet (1 tablet Oral Given 07/13/21 2231)    ED Course/ Medical Decision Making/ A&P                           Medical Decision Making   Patient Vitals for the past 24 hrs:  BP Temp Temp src Pulse Resp SpO2 Height Weight  07/13/21 2230 98/75 -- -- 95 17 96 % -- --  07/13/21 2112 (!) 109/92 -- -- 99 (!) 27 98 % -- --  07/13/21 2023 107/69 -- -- 96 16 98 % -- --  07/13/21 1900 122/81 -- -- 91 16 99 % -- --  07/13/21 1217 -- -- -- -- -- -- -- 55.4 kg  07/13/21 1208 -- -- -- -- -- -- 5\' 5"  (1.651 m) 55.3 kg  07/13/21 1137 122/60 98.2 F (36.8 C) Oral 91 18 98 % -- --    11:02 PM Reevaluation with update and discussion with patient. After initial assessment and treatment, an updated evaluation reveals he remains stable. Illness risk, worsening pulmonary distress, discussed. 09/10/21    Medical Decision Making: Summary: Middle-age male with congenital heart disease, status post operative management, presenting with subacute illness characterized by cough primarily.  He is not having active chest pain or significant shortness of breath.  Critical Interventions-elevation, laboratory testing, radiography, observation and reassessment; to evaluate  Chief Complaint  Patient presents with   Cough    and assess for illness characterized as Acute, Previously Undiagnosed, Uncertain Prognosis, and Complicated   After These Interventions, the Patient was reevaluated and was found to be stable from a respiratory and cardiac status.  Radiography indicates pneumonia, suspected to be  community-acquired.  No respiratory failure, metabolic disorders or suggestion for sepsis.  This patient is Presenting for Evaluation of cough, which does require a range of treatment options, and is a complaint that involves a moderate risk of morbidity and mortality.  The Differential Diagnoses include bronchitis, viral infection, pneumonia, heart failure.  I did not require additional Historical Information from anyone, as the patient is a code historian.  I decided to review pertinent External Data, and in summary urgent care visit on 07/11/2021.  Screening for viral infections not done at that time.  Started on prednisone on that visit.   Clinical Laboratory Tests Ordered,  included CBC and Metabolic panel. Review indicates normal except sodium slightly low, glucose high, calcium low. Emergent testing abnormality management required for stabilization-no  Radiologic Tests Ordered, included chest x-ray.  I independently Visualized: Radiographic images, which show left lower lobe pneumonia  Cardiac Monitor Tracing which shows atrial fibrillation, indicating chronic rhythm disorder    Pharmaceutical Risk Management oral antibiotics begun in the emergency department for treatment of pneumonia Prescription Management  Social Determinants of Health Risk he is not a smoker  Treatment Complication Risk no risk for discharge home for management.  Considered hospitalization but it is not necessary     CRITICAL CARE-no Performed by: Mancel Bale  Nursing Notes Reviewed/ Care Coordinated Applicable Imaging Reviewed Interpretation of Laboratory Data incorporated into ED treatment  The patient appears reasonably screened and/or stabilized for discharge and I doubt any other medical condition or other [facility] requiring further screening, evaluation, or treatment in the ED at this time prior to discharge.  Plan: Home Medications-continue usual and use Tylenol for fever; Home Treatments-gradually  advance diet and activity; return here if the recommended treatment, does not improve the symptoms; Recommended follow up-PCP, as needed            Final Clinical Impression(s) / ED Diagnoses Final diagnoses:  Community acquired pneumonia of left lower lobe of lung    Rx / DC Orders ED Discharge Orders          Ordered    amoxicillin-clavulanate (AUGMENTIN) 875-125 MG tablet  2 times daily        07/13/21 2216              Mancel Bale, MD 07/14/21 1202

## 2021-08-22 ENCOUNTER — Other Ambulatory Visit (HOSPITAL_COMMUNITY): Payer: Self-pay | Admitting: Internal Medicine

## 2021-11-10 ENCOUNTER — Other Ambulatory Visit: Payer: Self-pay | Admitting: Cardiology

## 2021-11-10 DIAGNOSIS — I5022 Chronic systolic (congestive) heart failure: Secondary | ICD-10-CM

## 2021-12-27 ENCOUNTER — Ambulatory Visit: Payer: Medicaid Other | Admitting: Cardiology

## 2022-01-03 ENCOUNTER — Encounter: Payer: Self-pay | Admitting: Cardiology

## 2022-01-03 ENCOUNTER — Ambulatory Visit: Payer: Medicaid Other | Admitting: Cardiology

## 2022-01-03 VITALS — BP 94/62 | HR 58 | Temp 98.2°F | Ht 65.0 in | Wt 117.6 lb

## 2022-01-03 DIAGNOSIS — I428 Other cardiomyopathies: Secondary | ICD-10-CM

## 2022-01-03 DIAGNOSIS — I4821 Permanent atrial fibrillation: Secondary | ICD-10-CM

## 2022-01-03 DIAGNOSIS — I5022 Chronic systolic (congestive) heart failure: Secondary | ICD-10-CM

## 2022-01-03 MED ORDER — ENTRESTO 24-26 MG PO TABS: 1.0000 | ORAL_TABLET | Freq: Two times a day (BID) | ORAL | 6 refills | Status: DC

## 2022-01-20 ENCOUNTER — Ambulatory Visit (INDEPENDENT_AMBULATORY_CARE_PROVIDER_SITE_OTHER): Payer: Medicaid Other

## 2022-01-20 ENCOUNTER — Encounter (HOSPITAL_COMMUNITY): Payer: Self-pay | Admitting: Emergency Medicine

## 2022-01-20 ENCOUNTER — Other Ambulatory Visit: Payer: Self-pay

## 2022-01-20 ENCOUNTER — Emergency Department (HOSPITAL_COMMUNITY)
Admission: EM | Admit: 2022-01-20 | Discharge: 2022-01-20 | Discharge status: Against Medical Advice | Payer: Medicaid Other

## 2022-01-20 ENCOUNTER — Ambulatory Visit (HOSPITAL_COMMUNITY)
Admission: EM | Admit: 2022-01-20 | Discharge: 2022-01-20 | Disposition: A | Discharge status: Home / Self Care | Payer: Medicaid Other | Attending: Internal Medicine | Admitting: Internal Medicine

## 2022-01-20 DIAGNOSIS — R053 Chronic cough: Secondary | ICD-10-CM | POA: Diagnosis not present

## 2022-01-20 DIAGNOSIS — I5043 Acute on chronic combined systolic (congestive) and diastolic (congestive) heart failure: Secondary | ICD-10-CM

## 2022-01-20 DIAGNOSIS — R059 Cough, unspecified: Secondary | ICD-10-CM | POA: Diagnosis not present

## 2022-01-20 DIAGNOSIS — J189 Pneumonia, unspecified organism: Secondary | ICD-10-CM

## 2022-01-20 NOTE — Discharge Instructions (Addendum)
Take your Lasix 2 times daily to reduce the amount of fluid in your lungs and help with your cough.  If this does not help with your shortness of breath and cough, or if you develop fever, worsening leg swelling, chest pain, or any new or worsening symptoms, please return to urgent care for reevaluation or call your cardiologist to schedule an appointment.  If your symptoms are severe, please go to the emergency room for further evaluation.  I hope you feel better!

## 2022-01-20 NOTE — ED Triage Notes (Signed)
Reports breathing a little different over the past 2 days.  Patient says he has felt like congestion in chest that he cant cough up.  Patient is speaking in complete sentences, periodic cough noticed by this nurse.  Denies any pain.    Patient reports he has a diagnosis of CHF  Patient states he has all medicines and is taking them as instructed

## 2022-01-20 NOTE — ED Notes (Signed)
Spoke to Fox, np about this patient and vital signs, complaint, history

## 2022-01-20 NOTE — ED Provider Notes (Signed)
[facility]    CSN: [id number] Arrival date & time: 01/20/22  0851      History   Chief Complaint Chief Complaint  Patient presents with   Cough    HPI Gary [name] is a 46 y.o. male.   Patient presents urgent care for evaluation of cough and worsening chest congestion over the last couple of days.  Patient has a significant medical history of chronic congestive heart failure that is managed by [facility] cardiology.  Patient states that he "feels like he has fluid in his lungs that he is not able to cough up".  Patient is on a variety of blood pressure medications as well as Lasix.  Patient states that his cardiologist recommended that he take Lasix twice daily, but patient states that he mostly needs Lasix at night to be able to sleep.  Patient denies swelling to his bilateral legs, chest pain, nausea, headache, fever/chills, hemoptysis, nasal congestion, abdominal pain, dizziness, and urinary symptoms.  Reports a small increase in shortness of breath with exertion but relates this to possible fluid in his lungs.  He states "I just want to come and get checked and see how much fluid I have on my lungs".  Denies weight change in the last week.  Denies use of over-the-counter medications for cough prior to arrival urgent care.   Cough   Past Medical History:  Diagnosis Date   Acute on chronic combined systolic and diastolic CHF, NYHA class 3 (HCC) 01/11/2019   Acute on chronic congestive heart failure (HCC)    Acute on chronic systolic and diastolic heart failure, NYHA class 1 (HCC) 02/17/2019   Atrial fibrillation (HCC)    CHF (congestive heart failure) (HCC)    Chronic atrial fibrillation (HCC) 10/27/2018   Chronic systolic heart failure (HCC) 05/16/2019   Dysrhythmia    hx. 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   Protein-calorie malnutrition, severe 10/17/2017   Severe mitral regurgitation 01/31/2019    Patient Active Problem List   Diagnosis Date Noted   Chronic systolic heart failure (HCC) 05/16/2019   Acute on chronic congestive heart failure (HCC)    Acute on chronic systolic and diastolic heart failure, NYHA class 1 (HCC) 02/17/2019   Atrial fibrillation (HCC) 01/31/2019   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 old or younger   CARDIAC SURGERY     CARDIOVERSION N/A 08/17/2017   Procedure: CARDIOVERSION;  Surgeon: Orpah Cobb, MD;  Location: [facility] ENDOSCOPY;  Service: Cardiovascular;  Laterality: N/A;   HERNIA REPAIR     INGUINAL HERNIA REPAIR Right 10/13/2017   Procedure: OPEN RIGHT INGUINAL HERNIA REPAIR ERAS PATHWAY;  Surgeon: Jimmye Norman, MD;  Location: [facility] OR;  Service: General;  Laterality: Right;   OPEN HEART SURGERY  AS A CHILD , at 2 yrs. old. done in [facility]   RIGHT HEART CATH N/A 02/22/2021   Procedure: RIGHT HEART CATH;  Surgeon: Dolores Patty, MD;  Location: [facility] INVASIVE CV LAB;  Service: Cardiovascular;  Laterality: N/A;   RIGHT/LEFT HEART CATH AND CORONARY ANGIOGRAPHY N/A 02/19/2019   Procedure: RIGHT/LEFT HEART CATH AND CORONARY ANGIOGRAPHY;  Surgeon: Elder Negus, MD;  Location: [facility] INVASIVE CV LAB;  Service: Cardiovascular;  Laterality: N/A;   TEE WITHOUT CARDIOVERSION N/A 08/17/2017   Procedure: TRANSESOPHAGEAL ECHOCARDIOGRAM (TEE);  Surgeon:  Orpah Cobb, MD;  Location: [facility] ENDOSCOPY;  Service: Cardiovascular;  Laterality: N/A;   TEE WITHOUT CARDIOVERSION N/A 02/19/2019   Procedure: TRANSESOPHAGEAL ECHOCARDIOGRAM (TEE);  Surgeon: Elder Negus, MD;  Location: [facility] ENDOSCOPY;  Service: Cardiovascular;  Laterality: N/A;       Home Medications    Prior to Admission medications   Medication Sig Start Date End Date Taking? Authorizing Provider  apixaban (ELIQUIS) 5 MG TABS tablet Take 1 tablet (5 mg total) by mouth 2 (two) times daily. 03/30/21   Patwardhan, Manish J, MD  BIDIL 20-37.5 MG tablet Take 1 tablet by mouth 3 (three) times daily. 03/30/21   Patwardhan, Anabel Bene, MD  furosemide (LASIX) 40 MG tablet Take 1 pill every morning. Take additional 1 pill in the afternoon on days with excessive shortness of breath, cough. 03/30/21   Patwardhan, Anabel Bene, MD  metoprolol succinate (TOPROL XL) 25 MG 24 hr tablet Take 1 tablet (25 mg total) by mouth daily. 03/30/21 03/30/22  Patwardhan, Anabel Bene, MD  sacubitril-valsartan (ENTRESTO) 24-26 MG Take 1 tablet by mouth 2 (two) times daily. 01/03/22   Patwardhan, Anabel Bene, MD  spironolactone (ALDACTONE) 25 MG tablet TAKE 1/2 TABLET BY MOUTH ONCE DAILY 11/11/21   Patwardhan, Anabel Bene, MD    Family History Family History  Problem Relation Age of Onset   Sarcoidosis Mother    Healthy Father     Social History Social History   Tobacco Use   Smoking status: Former    Packs/day: 1.00    Years: 20.00    Total pack years: 20.00    Types: Cigarettes    Quit date: 09/09/2018    Years since quitting: 3.3   Smokeless tobacco: Never  Vaping Use   Vaping Use: Never used  Substance Use Topics   Alcohol use: Yes    Comment: occ   Drug use: Not Currently    Types: Marijuana     Allergies   Farxiga [dapagliflozin] and Aspirin   Review of Systems Review of Systems  Respiratory:  Positive for cough.   Per HPI   Physical Exam Triage Vital Signs ED Triage Vitals  Enc Vitals Group      BP 01/20/22 0904 (!) 82/37     Pulse Rate 01/20/22 0904 77     Resp 01/20/22 0904 20     Temp 01/20/22 0904 97.6 F (36.4 C)     Temp Source 01/20/22 0904 Oral     SpO2 01/20/22 0904 95 %     Weight --      Height --      Head Circumference --      Peak Flow --      Pain Score 01/20/22 0901 0     Pain Loc --      Pain Edu? --      Excl. in GC? --    No data found.  Updated Vital Signs BP (!) 95/51  Pulse 77   Temp 97.6 F (36.4 C) (Oral)   Resp 20   Wt 118 lb 6.4 oz (53.7 kg)   SpO2 95%   BMI 19.70 kg/m   Visual Acuity Right Eye Distance:   Left Eye Distance:   Bilateral Distance:    Right Eye Near:   Left Eye Near:    Bilateral Near:     Physical Exam Vitals and nursing note reviewed.  Constitutional:      Appearance: Normal appearance. He is not ill-appearing or toxic-appearing.     Comments: Very pleasant patient sitting on exam in position of comfort table in no acute distress.   HENT:     Head: Normocephalic and atraumatic.     Right Ear: Hearing, tympanic membrane, ear canal and external ear normal.     Left Ear: Hearing, tympanic membrane, ear canal and external ear normal.     Nose: Nose normal.     Mouth/Throat:     Lips: Pink.     Mouth: Mucous membranes are moist.  Eyes:     General: Lids are normal. Vision grossly intact. Gaze aligned appropriately.     Extraocular Movements: Extraocular movements intact.     Conjunctiva/sclera: Conjunctivae normal.  Cardiovascular:     Rate and Rhythm: Normal rate and regular rhythm.     Heart sounds: Murmur heard.  Pulmonary:     Effort: Pulmonary effort is normal. No prolonged expiration or respiratory distress.     Breath sounds: Normal air entry. Examination of the right-upper field reveals decreased breath sounds. Examination of the left-upper field reveals rales. Examination of the right-middle field reveals decreased breath sounds. Examination of the left-middle field reveals rales. Examination of the  right-lower field reveals decreased breath sounds and rales. Examination of the left-lower field reveals rales. Decreased breath sounds and rales present. No wheezing or rhonchi.     Comments: Coarse crackles heard to the upper and lower left lung fields and to the right lower lung field.  Decreased breath sounds heard to the right lung field.  Patient is not in any acute respiratory distress at this time.  Abdominal:     General: Bowel sounds are normal.     Palpations: Abdomen is soft.     Tenderness: There is no abdominal tenderness. There is no right CVA tenderness, left CVA tenderness or guarding.  Musculoskeletal:     Cervical back: Full passive range of motion without pain, normal range of motion and neck supple.     Right lower leg: No edema.     Left lower leg: No edema.  Skin:    General: Skin is warm and dry.     Capillary Refill: Capillary refill takes less than 2 seconds.     Findings: No rash.  Neurological:     General: No focal deficit present.     Mental Status: He is alert and oriented to person, place, and time. Mental status is at baseline.     Cranial Nerves: No dysarthria or facial asymmetry.     Gait: Gait is intact.  Psychiatric:        Mood and Affect: Mood normal.        Speech: Speech normal.        Behavior: Behavior normal.        Thought Content: Thought content normal.        Judgment: Judgment normal.      UC Treatments / Results  Labs (all labs ordered are listed, but  only abnormal results are displayed) Labs Reviewed - No data to display  EKG   Radiology No results found.  Procedures Procedures (including critical care time)  Medications Ordered in UC Medications - No data to display  Initial Impression / Assessment and Plan / UC Course  I have reviewed the triage vital signs and the nursing notes.  Pertinent labs & imaging results that were available during my care of the patient were reviewed by me and considered in my medical  decision making (see chart for details).  1.  Acute on chronic combined systolic and diastolic congestive heart failure Chest x-ray shows stable findings without acute cardiopulmonary process.  Symptomology and physical exam are consistent with a CHF exacerbation.  Encourage patient to take Lasix 2 times daily to reduce the amount of fluid in the lungs to help with the cough as recommended by cardiologist.  Patient does not appear to be volume overloaded based on physical exam findings in the clinic.  Weight is stable.  Patient encouraged to check weight once daily at home to assess for fluid status changes.  Strict ED return precautions given.  Patient encouraged to follow-up with his cardiologist for further medication management regarding CHF.  No clinical indication for emergency department evaluation at this time due to stable clinical appearance and vital signs.  Patient's blood pressure improved on recheck to 95/51.  Patient reports that his blood pressures are always soft and this is normal for him.  He remains neurologically stable and without dizziness.  Discussed physical exam and available lab work findings in clinic with patient.  Counseled patient regarding appropriate use of medications and potential side effects for all medications recommended or prescribed today. Discussed red flag signs and symptoms of worsening condition,when to call the PCP office, return to urgent care, and when to seek higher level of care in the emergency department. Patient verbalizes understanding and agreement with plan. All questions answered. Patient discharged in stable condition.  Final Clinical Impressions(s) / UC Diagnoses   Final diagnoses:  Acute on chronic combined systolic and diastolic congestive heart failure (HCC)  Chronic cough     Discharge Instructions      Take your Lasix 2 times daily to reduce the amount of fluid in your lungs and help with your cough.  If this does not help with your  shortness of breath and cough, or if you develop fever, worsening leg swelling, chest pain, or any new or worsening symptoms, please return to urgent care for reevaluation or call your cardiologist to schedule an appointment.  If your symptoms are severe, please go to the emergency room for further evaluation.  I hope you feel better!      ED Prescriptions   None    PDMP not reviewed this encounter.   Carlisle Beers, [place] 01/23/22 1335

## 2022-01-27 ENCOUNTER — Ambulatory Visit: Payer: Medicaid Other

## 2022-01-27 DIAGNOSIS — I5022 Chronic systolic (congestive) heart failure: Secondary | ICD-10-CM

## 2022-02-08 ENCOUNTER — Other Ambulatory Visit: Payer: Self-pay | Admitting: Cardiology

## 2022-02-08 DIAGNOSIS — I482 Chronic atrial fibrillation, unspecified: Secondary | ICD-10-CM

## 2022-02-14 ENCOUNTER — Ambulatory Visit: Payer: Medicaid Other | Admitting: Cardiology

## 2022-04-07 ENCOUNTER — Other Ambulatory Visit: Payer: Self-pay | Admitting: Cardiology

## 2022-04-07 DIAGNOSIS — I5022 Chronic systolic (congestive) heart failure: Secondary | ICD-10-CM

## 2022-05-02 ENCOUNTER — Other Ambulatory Visit: Payer: Self-pay | Admitting: Cardiology

## 2022-05-02 DIAGNOSIS — I5022 Chronic systolic (congestive) heart failure: Secondary | ICD-10-CM

## 2022-06-27 ENCOUNTER — Encounter: Payer: Self-pay | Admitting: Cardiology

## 2022-06-27 ENCOUNTER — Ambulatory Visit: Payer: Medicaid Other | Admitting: Cardiology

## 2022-06-27 VITALS — BP 88/44 | HR 64 | Resp 16 | Ht 65.0 in | Wt 121.0 lb

## 2022-06-27 DIAGNOSIS — I428 Other cardiomyopathies: Secondary | ICD-10-CM

## 2022-06-27 DIAGNOSIS — I482 Chronic atrial fibrillation, unspecified: Secondary | ICD-10-CM

## 2022-06-27 DIAGNOSIS — I5022 Chronic systolic (congestive) heart failure: Secondary | ICD-10-CM

## 2022-06-27 DIAGNOSIS — I4821 Permanent atrial fibrillation: Secondary | ICD-10-CM

## 2022-06-27 MED ORDER — METOPROLOL SUCCINATE ER 25 MG PO TB24: ORAL_TABLET | ORAL | 3 refills | Status: DC

## 2022-06-27 MED ORDER — ENTRESTO 24-26 MG PO TABS
1.0000 | ORAL_TABLET | Freq: Two times a day (BID) | ORAL | 6 refills | Status: DC
Start: 2022-06-27 — End: 2022-11-11

## 2022-06-27 MED ORDER — APIXABAN 5 MG PO TABS: ORAL_TABLET | ORAL | 3 refills | Status: DC

## 2022-06-27 MED ORDER — FUROSEMIDE 40 MG PO TABS: ORAL_TABLET | ORAL | 3 refills | Status: DC

## 2022-06-27 MED ORDER — SPIRONOLACTONE 25 MG PO TABS: 12.5000 mg | ORAL_TABLET | Freq: Every day | ORAL | 3 refills | Status: DC

## 2022-06-27 NOTE — Progress Notes (Signed)
Patient referred by [name], FNP for chest pain, shortness of breath  Subjective:   Gary [name], male    DOB: November 07, 1975, 46 y.o.   MRN: [id number]   Chief Complaint  Patient presents with   Congestive Heart Failure   Follow-up    6 month    Cardiac Valve Problem    HPI  46 y.o. 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  Patient is doing well.  He has not been taking Bidil as he ran out. He denies chest pain, shortness of breath, palpitations, leg edema, orthopnea, PND, TIA/syncope.    Current Outpatient Medications:    ELIQUIS 5 MG TABS tablet, TAKE 1 TABLET(5 MG) BY MOUTH TWICE DAILY, Disp: 180 tablet, Rfl: 3   furosemide (LASIX) 40 MG tablet, TAKE 1 TABLET BY MOUTH EVERY MORNING. TAKE 1 ADDITIONAL TABLET IN THE AFTERNOON ON DAYS WITH EXCESSIVE SHORTNESS OF BREATH, COUGH, Disp: 90 tablet, Rfl: 3   metoprolol succinate (TOPROL-XL) 25 MG 24 hr tablet, TAKE 1 TABLET(25 MG) BY MOUTH DAILY, Disp: 90 tablet, Rfl: 3   sacubitril-valsartan (ENTRESTO) 24-26 MG, Take 1 tablet by mouth 2 (two) times daily., Disp: 180 tablet, Rfl: 6   spironolactone (ALDACTONE) 25 MG tablet, TAKE 1/2 TABLET BY MOUTH ONCE DAILY, Disp: 60 tablet, Rfl: 3   BIDIL 20-37.5 MG tablet, Take 1 tablet by mouth 3 (three) times daily. (Patient not taking: Reported on 06/27/2022), Disp: 270 tablet, Rfl: 3   Cardiovascular studies:  EKG 06/27/2022: Atrial fibrillation 64 bpm LVH Inferolateral T wave inversion, uncahgned from pripr  Echocardiogram 01/27/2022: Mildly depressed LV systolic function with visual EF 40-45%. Left ventricle cavity is dilated in size. Mild concentric hypertrophy of the left ventricle, findings consistent with non-compaction cardiomyopathy. Unable to evaluate diastolic function due to suboptimal  image acquisition.Hypokinetic global wall motion with prominent septal hypokinesis. Left atrial cavity is severely dilated. Coronary sinus is dilated - differential diagnosis includes but not limited to persistent left SVC, unroof coronary sinus (may consider bubble study via left arm), etc. Right atrial cavity is dilated. Moderate to severe aortic regurgitation. Severe (Grade IV) mitral regurgitation, eccentric jet along the free wall LA. Severe tricuspid regurgitation. Moderate pulmonary hypertension. RVSP measures 58 mmHg. Compared to 03/03/2020 no significant change.  [place] 02/22/2021: Findings:   RA = 5 RV = 53/4 PA = 61/22 (39) PCW = 27 (v = 35) Fick cardiac output/index = 4.5/2.8 PVR = 2.7 WU Ao sat = 95% PA sat = 67%, 68% R SVC sat = 75%  PAPi = 7.8   Assessment: 1. Elevated filling pressures with normal outputs 2. Prominent v- wave in PCWP tracing c/w known mitral regurgitation 3. 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 Continue Transplant w/u.    Echocardiogram 03/02/2020:  Left ventricle cavity is moderately dilated. Severe concentric hypertrophy  of the left ventricle. Moderate global and severe anteroseptal  hypokinesis. LVEF 40-45%.  Diastolic function not assessed due to atrial  fibrillation and severe mitral regurgitation.    Left atrial cavity is severely dilated.  Trileaflet aortic valve.  Moderate to severe aortic regurgitation. Mild  functional aortic stenosis.  Moderate mitral valve leaflet thickening. Severe (Grade IV) mitral  regurgitation. Mild functional mitral stenosis. Mean PG 4 mmHg at HR 56  bpm.  Moderate  to severe tricuspid regurgitation. Estimated pulmonary artery  systolic pressure 52 mmHg.  Mild pulmonic regurgitation.  Noncompaction cardiomyopathy. Compared to previous study on 05/06/2019,  LVEF is marginally improved from 35-40%.    cMRI 08/2017: 1. Severely  dilated left ventricular size, with normal thickness and severely decreased systolic function (LVEF =63%). There diffuse hypokinesis and paradoxical septal motion. 2. There is no late gadolinium enhancement in the left ventricular myocardium. 2. Normal right ventricular size, thickness and mildly decreased systolic function (LVEF = 42%). There are no regional wall motion Abnormalities. 3.There are significant trabeculations in the left ventricular apex with non-compacted to compacted myocardium ratio: 2.6:1. This is consistent with non-compaction cardiomyopathy. 4. Moderately dilated left atrium and mildly dilated right atrial size. 5. Mild to moderate aortic insufficiency. Moderate mitral regurgitation with posteriorly directed jet. Mild tricuspid regurgitation. No significant pulmonary regurgitation is seen.   EKG 01/31/2019: Atrial fibrillation with controlled ventricular response  Voltage criteria for LVH  T wave inversion abnormality  -Possible  Inferior and lateral  ischemia.     Recent labs: 07/13/2021: Glucose 128, BUN/Cr 13/0.84. EGFR >60. Na/K 133/4.7.  H/H 13/38. MCV 91. Platelets 188   Review of Systems  Cardiovascular:  Negative for chest pain, dyspnea on exertion, leg swelling, palpitations and syncope.         Objective:     Today's Vitals   06/27/22 0917  BP: (!) 88/44  Pulse: 64  Resp: 16  SpO2: 95%  Weight: 121 lb (54.9 kg)  Height: _0  (1.651 m)   Body mass index is 20.14 kg/m.   Filed Weights   06/27/22 0917  Weight: 121 lb (54.9 kg)    Physical Exam Vitals and nursing note reviewed.  Constitutional:      General: He is not in acute distress.    Appearance: He is underweight.  Neck:     Vascular: No JVD.  Cardiovascular:     Rate and Rhythm: Normal rate. Rhythm irregular.     Heart sounds: Murmur heard.     High-pitched blowing holosystolic murmur is present with a grade of 2/6 at the apex.  Pulmonary:     Effort: Pulmonary  effort is normal.     Breath sounds: Normal breath sounds. No wheezing or rales.  Musculoskeletal:     Right lower leg: No edema.     Left lower leg: No edema.          Assessment & Recommendations:   46 y.o. 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  Noncompaction cardiomyopathy: NYHA I-II. Stable echocardiogram (01/2022) Continue Entresto 24-26 mg twice daily, metoprolol succinate 25 mg daily, spironolactone 12.5 mg daily. Take lasix 40 mg only once a day, with additional dose as needed for dyspnea, leg edema, Not on Farxiga, Bidil due to low blood pressure.   Aortic and mitral regurgitation: Moderate, clinically stable  Permanent atrial fibrillation: Rate controlled. CHA2DSVasc score 1 with annual  Stroke risk 0.6%. However, given his compaction cardiomyopathy, his stroke risk is higher.  Continue anticoagulation with apixaban 5 mg bid.   Labs now F/u in 6 months    [name], MD Pager: [phone] Office: [phone]

## 2022-06-28 LAB — COMPREHENSIVE METABOLIC PANEL
ALT: 13 IU/L (ref 0–44)
AST: 16 IU/L (ref 0–40)
Albumin/Globulin Ratio: 2 (ref 1.2–2.2)
Albumin: 4 g/dL — ABNORMAL LOW (ref 4.1–5.1)
Alkaline Phosphatase: 121 IU/L (ref 44–121)
BUN/Creatinine Ratio: 14 (ref 9–20)
BUN: 13 mg/dL (ref 6–24)
Bilirubin Total: 1.2 mg/dL (ref 0.0–1.2)
CO2: 20 mmol/L (ref 20–29)
Calcium: 8.8 mg/dL (ref 8.7–10.2)
Chloride: 104 mmol/L (ref 96–106)
Creatinine, Ser: 0.95 mg/dL (ref 0.76–1.27)
Globulin, Total: 2 g/dL (ref 1.5–4.5)
Glucose: 92 mg/dL (ref 70–99)
Potassium: 4.4 mmol/L (ref 3.5–5.2)
Sodium: 140 mmol/L (ref 134–144)
Total Protein: 6 g/dL (ref 6.0–8.5)
eGFR: 99 mL/min/{1.73_m2} (ref >59)

## 2022-06-28 LAB — PRO B NATRIURETIC PEPTIDE: NT-Pro BNP: 1549 pg/mL — ABNORMAL HIGH (ref 0–121)

## 2022-06-29 ENCOUNTER — Other Ambulatory Visit: Payer: Self-pay | Admitting: Cardiology

## 2022-06-29 DIAGNOSIS — I5022 Chronic systolic (congestive) heart failure: Secondary | ICD-10-CM

## 2022-06-29 MED ORDER — FUROSEMIDE 40 MG PO TABS: 40.0000 mg | ORAL_TABLET | Freq: Two times a day (BID) | ORAL | 3 refills | Status: DC

## 2022-06-29 NOTE — Progress Notes (Signed)
Called patient no answer, left a vm

## 2022-06-29 NOTE — Progress Notes (Signed)
Although he is asymptomatic, there is some baseline congestion due to heart failure. Do take lasix 40 mg twice a day, as opposed to daily. I will send a new prescription of lasix 40 mg twice daily, as he may run out sooner.  Thanks MJP

## 2022-06-29 NOTE — Progress Notes (Signed)
  Patient called back and I informed him about his lab results, and about his lasix 40mg  BID.

## 2022-11-11 ENCOUNTER — Other Ambulatory Visit: Payer: Self-pay

## 2022-11-11 DIAGNOSIS — I5022 Chronic systolic (congestive) heart failure: Secondary | ICD-10-CM

## 2022-11-11 MED ORDER — ENTRESTO 24-26 MG PO TABS
1.0000 | ORAL_TABLET | Freq: Two times a day (BID) | ORAL | 3 refills | Status: DC
Start: 2022-11-11 — End: 2022-12-28

## 2022-11-11 MED ORDER — ENTRESTO 24-26 MG PO TABS
1.0000 | ORAL_TABLET | Freq: Two times a day (BID) | ORAL | 3 refills | Status: DC
Start: 2022-11-11 — End: 2022-11-11

## 2022-12-27 NOTE — Progress Notes (Unsigned)
Patient referred by Diamantina Providence, FNP for chest pain, shortness of breath  Subjective:   Gary [name], male    DOB: 12-09-1975, 47 y.o.   MRN: [id number]   No chief complaint on file.   HPI  47 y.o. 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  Patient is doing well.  He has not been taking Bidil as he ran out. He denies chest pain, shortness of breath, palpitations, leg edema, orthopnea, PND, TIA/syncope.    Current Outpatient Medications:    apixaban (ELIQUIS) 5 MG TABS tablet, TAKE 1 TABLET(5 MG) BY MOUTH TWICE DAILY, Disp: 180 tablet, Rfl: 3   furosemide (LASIX) 40 MG tablet, Take 1 tablet (40 mg total) by mouth 2 (two) times daily., Disp: 180 tablet, Rfl: 3   metoprolol succinate (TOPROL-XL) 25 MG 24 hr tablet, TAKE 1 TABLET(25 MG) BY MOUTH DAILY, Disp: 90 tablet, Rfl: 3   sacubitril-valsartan (ENTRESTO) 24-26 MG, Take 1 tablet by mouth 2 (two) times daily., Disp: 180 tablet, Rfl: 3   spironolactone (ALDACTONE) 25 MG tablet, Take 0.5 tablets (12.5 mg total) by mouth daily., Disp: 60 tablet, Rfl: 3   Cardiovascular studies:  EKG 06/27/2022: Atrial fibrillation 64 bpm LVH Inferolateral T wave inversion, uncahgned from pripr  Echocardiogram 01/27/2022: Mildly depressed LV systolic function with visual EF 40-45%. Left ventricle cavity is dilated in size. Mild concentric hypertrophy of the left ventricle, findings consistent with non-compaction cardiomyopathy. Unable to evaluate diastolic function due to suboptimal image acquisition.Hypokinetic global wall motion with prominent septal hypokinesis. Left atrial cavity is severely dilated. Coronary sinus is dilated - differential diagnosis includes but not limited to persistent left SVC, unroof coronary sinus (may consider bubble study via left arm),  etc. Right atrial cavity is dilated. Moderate to severe aortic regurgitation. Severe (Grade IV) mitral regurgitation, eccentric jet along the free wall LA. Severe tricuspid regurgitation. Moderate pulmonary hypertension. RVSP measures 58 mmHg. Compared to 03/03/2020 no significant change.  [facility] 02/22/2021: Findings:   RA = 5 RV = 53/4 PA = 61/22 (39) PCW = 27 (v = 35) Fick cardiac output/index = 4.5/2.8 PVR = 2.7 WU Ao sat = 95% PA sat = 67%, 68% R SVC sat = 75%  PAPi = 7.8   Assessment: 1. Elevated filling pressures with normal outputs 2. Prominent v- wave in PCWP tracing c/w known mitral regurgitation 3. 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 Severely dilated left ventricular size, with normal thickness and severely decreased systolic function (LVEF =31%). There diffuse hypokinesis and paradoxical septal motion. 2. There is no late gadolinium enhancement in the left ventricular myocardium. 2. Normal right ventricular size, thickness and mildly decreased systolic function (LVEF = 42%). There are no regional wall motion Abnormalities. 3.There are significant trabeculations in the left ventricular apex with non-compacted to compacted myocardium ratio: 2.6:1. This is consistent with non-compaction cardiomyopathy. 4. Moderately dilated left atrium and mildly dilated right atrial size. 5. Mild to moderate aortic insufficiency. Moderate mitral regurgitation with posteriorly directed jet. Mild tricuspid regurgitation. No significant pulmonary regurgitation is seen.    Recent labs: 06/27/2022: Glucose 92, BUN/Cr 13/0.95. EGFR 99. Na/K 140/4.4. Rest of the CMP normal NTproBNP 1549  07/13/2021: Glucose 128, BUN/Cr 13/0.84. EGFR >60. Na/K 133/4.7.  H/H 13/38. MCV 91. Platelets 188   Review of Systems  Cardiovascular:  Negative for chest pain, dyspnea on exertion, leg swelling, palpitations and syncope.         Objective:      There were no vitals filed for this visit.  There is no height or weight on file to calculate BMI.   There were no vitals filed for this visit.   Physical Exam Vitals and nursing note reviewed.  Constitutional:      General: He is not in acute distress.    Appearance: He is underweight.  Neck:     Vascular: No JVD.  Cardiovascular:     Rate and Rhythm: Normal rate. Rhythm irregular.     Heart sounds: Murmur heard.     High-pitched blowing holosystolic murmur is present with a grade of 2/6 at the apex.  Pulmonary:     Effort: Pulmonary effort is normal.     Breath sounds: Normal breath sounds. No wheezing or rales.  Musculoskeletal:     Right lower leg: No edema.     Left lower leg: No edema.          Assessment & Recommendations:   47 y.o. 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  Noncompaction cardiomyopathy: NYHA I-II. Stable echocardiogram (01/2022) Continue Entresto 24-26 mg twice daily, metoprolol succinate 25 mg daily, spironolactone 12.5 mg daily. Take lasix 40 mg only once a day, with additional dose as needed for dyspnea, leg edema, Not on Farxiga, Bidil due to low blood pressure.   Aortic and mitral regurgitation: Moderate, clinically stable  Permanent atrial fibrillation: Rate controlled. CHA2DSVasc score 1 with annual  Stroke risk 0.6%. However, given his compaction cardiomyopathy, his stroke risk is higher.  Continue anticoagulation with apixaban 5 mg bid.   Labs now F/u in 6 months    Elder Negus, MD Pager: [phone] Office: [phone]

## 2022-12-28 ENCOUNTER — Ambulatory Visit: Payer: Medicaid Other | Admitting: Cardiology

## 2022-12-28 ENCOUNTER — Encounter: Payer: Self-pay | Admitting: Cardiology

## 2022-12-28 VITALS — BP 89/47 | HR 49 | Ht 65.0 in | Wt 118.0 lb

## 2022-12-28 DIAGNOSIS — I4821 Permanent atrial fibrillation: Secondary | ICD-10-CM

## 2022-12-28 DIAGNOSIS — I482 Chronic atrial fibrillation, unspecified: Secondary | ICD-10-CM

## 2022-12-28 DIAGNOSIS — I428 Other cardiomyopathies: Secondary | ICD-10-CM

## 2022-12-28 DIAGNOSIS — I5022 Chronic systolic (congestive) heart failure: Secondary | ICD-10-CM

## 2022-12-28 MED ORDER — ENTRESTO 24-26 MG PO TABS: 1.0000 | ORAL_TABLET | Freq: Two times a day (BID) | ORAL | 3 refills | Status: DC

## 2022-12-28 MED ORDER — FUROSEMIDE 40 MG PO TABS: 40.0000 mg | ORAL_TABLET | Freq: Two times a day (BID) | ORAL | 3 refills | Status: DC

## 2022-12-28 MED ORDER — METOPROLOL SUCCINATE ER 25 MG PO TB24: ORAL_TABLET | ORAL | 3 refills | Status: DC

## 2022-12-28 MED ORDER — APIXABAN 5 MG PO TABS
ORAL_TABLET | ORAL | 3 refills | Status: DC
Start: 2022-12-28 — End: 2023-02-10

## 2022-12-28 MED ORDER — SPIRONOLACTONE 25 MG PO TABS: 12.5000 mg | ORAL_TABLET | Freq: Every day | ORAL | 3 refills | Status: DC

## 2023-02-10 ENCOUNTER — Other Ambulatory Visit: Payer: Self-pay | Admitting: Cardiology

## 2023-02-10 DIAGNOSIS — I482 Chronic atrial fibrillation, unspecified: Secondary | ICD-10-CM

## 2023-06-29 ENCOUNTER — Ambulatory Visit: Payer: Self-pay | Admitting: Cardiology

## 2023-07-18 ENCOUNTER — Ambulatory Visit: Payer: MEDICAID | Attending: Cardiology | Admitting: Cardiology

## 2023-07-18 ENCOUNTER — Encounter: Payer: Self-pay | Admitting: Cardiology

## 2023-07-18 ENCOUNTER — Other Ambulatory Visit: Payer: Self-pay

## 2023-07-18 VITALS — BP 92/62 | HR 61 | Resp 16 | Ht 65.0 in | Wt 122.6 lb

## 2023-07-18 DIAGNOSIS — I4821 Permanent atrial fibrillation: Secondary | ICD-10-CM

## 2023-07-18 DIAGNOSIS — I351 Nonrheumatic aortic (valve) insufficiency: Secondary | ICD-10-CM | POA: Diagnosis not present

## 2023-07-18 DIAGNOSIS — I5022 Chronic systolic (congestive) heart failure: Secondary | ICD-10-CM

## 2023-07-18 DIAGNOSIS — I482 Chronic atrial fibrillation, unspecified: Secondary | ICD-10-CM

## 2023-07-18 MED ORDER — FUROSEMIDE 40 MG PO TABS: 40.0000 mg | ORAL_TABLET | Freq: Two times a day (BID) | ORAL | 3 refills | Status: DC

## 2023-07-18 MED ORDER — SPIRONOLACTONE 25 MG PO TABS: 12.5000 mg | ORAL_TABLET | Freq: Every day | ORAL | 3 refills | Status: DC

## 2023-07-18 MED ORDER — METOPROLOL SUCCINATE ER 25 MG PO TB24: ORAL_TABLET | ORAL | 3 refills | Status: DC

## 2023-07-18 MED ORDER — ENTRESTO 24-26 MG PO TABS: 1.0000 | ORAL_TABLET | Freq: Two times a day (BID) | ORAL | 3 refills | Status: DC

## 2023-07-18 MED ORDER — APIXABAN 5 MG PO TABS: 5.0000 mg | ORAL_TABLET | Freq: Two times a day (BID) | ORAL | 3 refills | Status: DC

## 2023-07-18 NOTE — Patient Instructions (Addendum)
 Medication Instructions:   Your physician recommends that you continue on your current medications as directed. Please refer to the Current Medication list given to you today.  *If you need a refill on your cardiac medications before your next appointment, please call your pharmacy*   Lab Work:  SAME DAY AS YOU COME INTO THE OFFICE TO HAVE YOUR ECHO DONE--LAB IS ON FIRST FLOOR AT [facility] AT THIS BUILDING--BMET AND PRO-BNP AND CBC W DIFF  If you have labs (blood work) drawn today and your tests are completely normal, you will receive your results only by: MyChart Message (if you have MyChart) OR A paper copy in the mail If you have any lab test that is abnormal or we need to change your treatment, we will call you to review the results.   Testing/Procedures:  Your physician has requested that you have an echocardiogram. Echocardiography is a painless test that uses sound waves to create images of your heart. It provides your doctor with information about the size and shape of your heart and how well your heart's chambers and valves are working. This procedure takes approximately one hour. There are no restrictions for this procedure.  PATIENT WILL HAVE LABS DONE SAME DAY AS HIS ECHO  Please do NOT wear cologne, perfume, aftershave, or lotions (deodorant is allowed). Please arrive 15 minutes prior to your appointment time.  Please note: We ask at that you not bring children with you during ultrasound (echo/ vascular) testing. Due to room size and safety concerns, children are not allowed in the ultrasound rooms during exams. Our front office staff cannot provide observation of children in our lobby area while testing is being conducted. An adult accompanying a patient to their appointment will only be allowed in the ultrasound room at the discretion of the ultrasound technician under special circumstances. We apologize for any inconvenience.    Follow-Up: At [facility], you and  your health needs are our priority.  As part of our continuing mission to provide you with exceptional heart care, we have created designated Provider Care Teams.  These Care Teams include your primary Cardiologist (physician) and Advanced Practice Providers (APPs -  Physician Assistants and Nurse Practitioners) who all work together to provide you with the care you need, when you need it.  We recommend signing up for the patient portal called MyChart.  Sign up information is provided on this After Visit Summary.  MyChart is used to connect with patients for Virtual Visits (Telemedicine).  Patients are able to view lab/test results, encounter notes, upcoming appointments, etc.  Non-urgent messages can be sent to your provider as well.   To learn more about what you can do with MyChart, go to [email or web address].    Your next appointment:   6 month(s)  Provider:   DR. PATWARDHAN

## 2023-07-18 NOTE — Progress Notes (Signed)
 Cardiology Office Note:  .   Date:  07/18/2023  ID:  Gary [name], DOB July 15, 1975, MRN [id number] PCP: Lenon Nell SAILOR, FNP  [facility] Providers Cardiologist:  Newman Lawrence, MD PCP: Lenon Nell SAILOR, FNP  Chief Complaint  Patient presents with   Chronic systolic heart failure   Follow-up      History of Present Illness: Gary [name]    [name] is a 48 y.o. 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   Patient is doing well.  He works full-time at [employer].  With his regular activity, he denies any complaints of chest pain, shortness of breath orthopnea, PND, leg edema.  Blood pressure remains on low side.  He is compliant with his medical therapy.   Vitals:   07/18/23 0814  BP: 92/62  Pulse: 61  Resp: 16  SpO2: 95%     ROS:  Review of Systems  Cardiovascular:  Negative for chest pain, dyspnea on exertion, leg swelling, palpitations and syncope.     Studies Reviewed: Gary [name]    EKG 07/18/2023: Atrial fibrillation Left ventricular hypertrophy with QRS widening and repolarization abnormality ( Sokolow-Lyon , Cornell product , Romhilt-Estes ) Prolonged QT When compared with ECG of 13-Jul-2021 21:16, No significant change noted     Risk Assessment/Calculations:    CHA2DS2-VASc Score = 1 T This indicates a 0.6% annual risk of stroke. The patient's score is based upon:   Physical Exam:   Physical Exam Vitals and nursing note reviewed.  Constitutional:      General: He is not in acute distress. Neck:     Vascular: No JVD.  Cardiovascular:     Rate and Rhythm: Normal rate. Rhythm irregular.     Heart sounds: Murmur heard.     High-pitched blowing decrescendo early diastolic murmur is present with a grade of 2/4 at the upper right sternal border radiating to the apex.  Pulmonary:     Effort:  Pulmonary effort is normal.     Breath sounds: Normal breath sounds. No wheezing or rales.  Musculoskeletal:     Right lower leg: No edema.     Left lower leg: No edema.      VISIT DIAGNOSES:   ICD-10-CM   1. Chronic systolic heart failure (HCC)  P49.77 EKG 12-Lead    Basic metabolic panel    Pro b natriuretic peptide (BNP)    ECHOCARDIOGRAM COMPLETE    Pro b natriuretic peptide (BNP)    Basic metabolic panel       ASSESSMENT AND PLAN: .    Gary [name] is a 48 y.o. 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   Noncompaction cardiomyopathy: NYHA I-II. Stable echocardiogram (01/2022) Continue Entresto  24-26 mg twice daily, metoprolol  succinate 25 mg daily, spironolactone  12.5 mg daily, lasix  40-80 mg daily. Will refill meds today. Not on Farxiga , Bidil  due to low blood pressure.  Will check CBC, BMP, BNP today.  Aortic and mitral regurgitation: Moderate, clinically stable Will check echocardiogram today.   Permanent atrial fibrillation: Rate controlled. CHA2DSVasc score 1 with annual  Stroke risk 0.6%. However, given his compaction cardiomyopathy, his stroke risk is higher.  Continue anticoagulation with apixaban  5 mg bid.  F/u in 6 months  Signed, Newman JINNY Lawrence, MD

## 2023-07-18 NOTE — Telephone Encounter (Signed)
 Prescription refill request for Eliquis received. Indication:afib Last office visit:1/25 EAV:[id number] 07/18/23 Age: 48 Weight:55.6  kg  Prescription refilled

## 2023-08-09 ENCOUNTER — Encounter: Payer: Self-pay | Admitting: Cardiology

## 2023-08-09 ENCOUNTER — Ambulatory Visit (HOSPITAL_COMMUNITY): Payer: MEDICAID | Attending: Internal Medicine

## 2023-08-09 DIAGNOSIS — I5022 Chronic systolic (congestive) heart failure: Secondary | ICD-10-CM | POA: Insufficient documentation

## 2023-08-09 LAB — ECHOCARDIOGRAM COMPLETE
Area-P 1/2: 2.74 cm2
Est EF: 30
MV M vel: 4.65 m/s
MV Peak grad: 86.4 mm[Hg]
MV VTI: 1.65 cm2
P 1/2 time: 439 ms
S' Lateral: 5.5 cm

## 2023-08-09 MED ORDER — PERFLUTREN LIPID MICROSPHERE
1.0000 mL | INTRAVENOUS | Status: AC | PRN
  Administered 2023-08-09: 2 mL via INTRAVENOUS

## 2023-08-11 LAB — BASIC METABOLIC PANEL
BUN/Creatinine Ratio: 18 (ref 9–20)
BUN: 19 mg/dL (ref 6–24)
CO2: 22 mmol/L (ref 20–29)
Calcium: 8.9 mg/dL (ref 8.7–10.2)
Chloride: 101 mmol/L (ref 96–106)
Creatinine, Ser: 1.08 mg/dL (ref 0.76–1.27)
Glucose: 98 mg/dL (ref 70–99)
Potassium: 3.6 mmol/L (ref 3.5–5.2)
Sodium: 138 mmol/L (ref 134–144)
eGFR: 85 mL/min/{1.73_m2} (ref >59)

## 2023-08-11 LAB — CBC WITH DIFFERENTIAL/PLATELET
Basophils Absolute: 0 10*3/uL (ref 0.0–0.2)
Basos: 0 %
EOS (ABSOLUTE): 0.1 10*3/uL (ref 0.0–0.4)
Eos: 2 %
Hematocrit: 40.8 % (ref 37.5–51.0)
Hemoglobin: 13.8 g/dL (ref 13.0–17.7)
Immature Grans (Abs): 0 10*3/uL (ref 0.0–0.1)
Immature Granulocytes: 0 %
Lymphocytes Absolute: 1.1 10*3/uL (ref 0.7–3.1)
Lymphs: 17 %
MCH: 30.8 pg (ref 26.6–33.0)
MCHC: 33.8 g/dL (ref 31.5–35.7)
MCV: 91 fL (ref 79–97)
Monocytes Absolute: 0.6 10*3/uL (ref 0.1–0.9)
Monocytes: 9 %
Neutrophils Absolute: 4.8 10*3/uL (ref 1.4–7.0)
Neutrophils: 72 %
Platelets: 178 10*3/uL (ref 150–450)
RBC: 4.48 x10E6/uL (ref 4.14–5.80)
RDW: 11.7 % (ref 11.6–15.4)
WBC: 6.8 10*3/uL (ref 3.4–10.8)

## 2023-08-11 LAB — PRO B NATRIURETIC PEPTIDE: NT-Pro BNP: 2149 pg/mL — ABNORMAL HIGH (ref 0–121)

## 2023-08-11 NOTE — Progress Notes (Signed)
He is fairly stable with no overt symptoms, but EF is lower than before. proBNP is elevated.  Not much room to go up on GDMT for heart failure due to low blood pressure.  He has not seen heart failure clinic in a long time. Recommend re-evaluation by advanced heart failure clinic.  Thanks MJP

## 2023-08-15 ENCOUNTER — Telehealth: Payer: Self-pay | Admitting: *Deleted

## 2023-08-15 ENCOUNTER — Telehealth (HOSPITAL_COMMUNITY): Payer: Self-pay | Admitting: Cardiology

## 2023-08-15 DIAGNOSIS — I5020 Unspecified systolic (congestive) heart failure: Secondary | ICD-10-CM

## 2023-08-15 DIAGNOSIS — I5022 Chronic systolic (congestive) heart failure: Secondary | ICD-10-CM

## 2023-08-15 DIAGNOSIS — I428 Other cardiomyopathies: Secondary | ICD-10-CM

## 2023-08-15 DIAGNOSIS — I5043 Acute on chronic combined systolic (congestive) and diastolic (congestive) heart failure: Secondary | ICD-10-CM

## 2023-08-15 NOTE — Telephone Encounter (Signed)
-----   Message from [facility] sent at 08/11/2023  8:10 AM EST ----- He is fairly stable with no overt symptoms, but EF is lower than before. proBNP is elevated.  Not much room to go up on GDMT for heart failure due to low blood pressure.  He has not seen heart failure clinic in a long time. Recommend re-evaluation by advanced heart failure clinic.  Thanks MJP

## 2023-08-15 NOTE — Telephone Encounter (Signed)
Message sent to sch team to sch appt

## 2023-08-15 NOTE — Telephone Encounter (Signed)
 Called patient at [phone] and left him a voice message to call front office back to schedule a f/u appt in the AHF clinic.   Per Powell Latino, RN this patient was last seen in the AHF clinic in 2022.  This patient will need a f/u appt with APP clinic or Dr. Zenaida next week.

## 2023-08-15 NOTE — Telephone Encounter (Signed)
 The patient has been notified of the result and verbalized understanding.  All questions (if any) were answered.  Pt aware that we will place an urgent referral back to CHF Clinic to see any HF MD (former Dr. Cherrie pt), being he use to be established with them, but hasn't seen them in quite sometime.   Pt aware I will place the referral and send a staff message to their scheduler, to reach out to him soon, to arrange this appointment at the very next available appt slot.  Advised him to be on the listen out.  Advised him to continue his regimen for now.  Pt verbalized understanding and agrees with this plan.

## 2023-08-21 NOTE — Telephone Encounter (Signed)
 Appt sch 2/12 with Dr Alease Amend

## 2023-08-23 ENCOUNTER — Other Ambulatory Visit (HOSPITAL_COMMUNITY): Payer: Self-pay

## 2023-08-23 ENCOUNTER — Ambulatory Visit (HOSPITAL_COMMUNITY)
Admission: RE | Admit: 2023-08-23 | Discharge: 2023-08-23 | Disposition: A | Discharge status: Home / Self Care | Payer: MEDICAID | Source: Ambulatory Visit | Attending: Cardiology | Admitting: Cardiology

## 2023-08-23 ENCOUNTER — Encounter (HOSPITAL_COMMUNITY): Payer: Self-pay | Admitting: Cardiology

## 2023-08-23 VITALS — BP 90/50 | HR 68 | Wt 123.6 lb

## 2023-08-23 DIAGNOSIS — I5022 Chronic systolic (congestive) heart failure: Secondary | ICD-10-CM | POA: Diagnosis present

## 2023-08-23 DIAGNOSIS — I4891 Unspecified atrial fibrillation: Secondary | ICD-10-CM | POA: Insufficient documentation

## 2023-08-23 DIAGNOSIS — Z79899 Other long term (current) drug therapy: Secondary | ICD-10-CM | POA: Insufficient documentation

## 2023-08-23 DIAGNOSIS — Z87891 Personal history of nicotine dependence: Secondary | ICD-10-CM | POA: Diagnosis not present

## 2023-08-23 DIAGNOSIS — Z7901 Long term (current) use of anticoagulants: Secondary | ICD-10-CM | POA: Insufficient documentation

## 2023-08-23 LAB — BASIC METABOLIC PANEL
Anion gap: 8 (ref 5–15)
BUN: 14 mg/dL (ref 6–20)
CO2: 23 mmol/L (ref 22–32)
Calcium: 8.4 mg/dL — ABNORMAL LOW (ref 8.9–10.3)
Chloride: 106 mmol/L (ref 98–111)
Creatinine, Ser: 0.99 mg/dL (ref 0.61–1.24)
GFR, Estimated: >60 mL/min (ref >60)
Glucose, Bld: 110 mg/dL — ABNORMAL HIGH (ref 70–99)
Potassium: 3.1 mmol/L — ABNORMAL LOW (ref 3.5–5.1)
Sodium: 137 mmol/L (ref 135–145)

## 2023-08-23 LAB — CBC
HCT: 33.8 % — ABNORMAL LOW (ref 39.0–52.0)
Hemoglobin: 11.8 g/dL — ABNORMAL LOW (ref 13.0–17.0)
MCH: 30.4 pg (ref 26.0–34.0)
MCHC: 34.9 g/dL (ref 30.0–36.0)
MCV: 87.1 fL (ref 80.0–100.0)
Platelets: 156 10*3/uL (ref 150–400)
RBC: 3.88 MIL/uL — ABNORMAL LOW (ref 4.22–5.81)
RDW: 11.9 % (ref 11.5–15.5)
WBC: 6.5 10*3/uL (ref 4.0–10.5)
nRBC: 0 % (ref 0.0–0.2)

## 2023-08-23 MED ORDER — FARXIGA 10 MG PO TABS: 10.0000 mg | ORAL_TABLET | Freq: Every day | ORAL | 11 refills | Status: DC

## 2023-08-23 NOTE — Progress Notes (Incomplete)
   ADVANCED HEART FAILURE FOLLOW UP CLINIC NOTE  Referring Physician: Diamantina Providence, FNP  Primary Care: Diamantina Providence, FNP Primary Cardiologist:  HPI: Gary [name] is a 48 y.o. 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      {Anything typed between these two boxes will persist and can be pulled forward to future notes. This phrase will delete itself when the note is signed :1}   @HCHISTORYNARRATIVE @     SUBJECTIVE: Patient reports that overall he is doing well. He is currently working two jobs as well as taking care of his four daughters as a single father, though has a girlfriend that helps out.    PMH, current medications, allergies, social history, and family history reviewed in epic.  PHYSICAL EXAM: There were no vitals filed for this visit. GENERAL: Well nourished and in no apparent distress at rest.  PULM:  Normal work of breathing, clear to auscultation bilaterally. Respirations are unlabored.  CARDIAC:  JVP: ***         Normal rate with regular rhythm. No murmurs, rubs or gallops.  *** edema. Warm and well perfused extremities. ABDOMEN: Soft, non-tender, non-distended. NEUROLOGIC: Patient is oriented x3 with no focal or lateralizing neurologic deficits.    DATA REVIEW  ECG: ***    ECHO: ***   CATH: ***   CMR: 2019: Severely dilated LV, LVEF 31%, no LGE, normal RV size, mildly decreased function, consistent with noncompaction cardiomyopathy  Heart failure review: - Classification: {HFCLASS:30917} - Etiology: {Cardiomyopathy:30918} - NYHA Class:  - Volume status: {volumechf:30919} - ACEi/ARB/ARNI: {HF:30752} - Aldosterone antagonist: {HF:30752} - Beta-blocker: {HF:30752} - Digoxin: {HF:30752} - Hydralazine/Nitrates:  {HF:30752} - SGLT2i: {HF:30752} - GLP-1: {GLP:30906} - Advanced therapies: {Advancedtherapies:30916} - ICD: {ICD:30901}  ASSESSMENT & PLAN:  ***  Follow up in ***  Clearnce Hasten, MD Advanced Heart Failure Mechanical Circulatory Support 08/23/23

## 2023-08-23 NOTE — H&P (View-Only) (Signed)
   ADVANCED HEART FAILURE FOLLOW UP CLINIC NOTE  Referring Physician: Diamantina Providence, FNP  Primary Care: Diamantina Providence, FNP Primary Cardiologist:  HPI: Gary [name] is a 48 y.o. 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      {Anything typed between these two boxes will persist and can be pulled forward to future notes. This phrase will delete itself when the note is signed :1}   @HCHISTORYNARRATIVE @     SUBJECTIVE: Patient reports that overall he is doing well. He is currently working two jobs as well as taking care of his four daughters as a single father, though has a girlfriend that helps out.    PMH, current medications, allergies, social history, and family history reviewed in epic.  PHYSICAL EXAM: There were no vitals filed for this visit. GENERAL: Well nourished and in no apparent distress at rest.  PULM:  Normal work of breathing, clear to auscultation bilaterally. Respirations are unlabored.  CARDIAC:  JVP: ***         Normal rate with regular rhythm. No murmurs, rubs or gallops.  *** edema. Warm and well perfused extremities. ABDOMEN: Soft, non-tender, non-distended. NEUROLOGIC: Patient is oriented x3 with no focal or lateralizing neurologic deficits.    DATA REVIEW  ECG: ***    ECHO: ***   CATH: ***   CMR: 2019: Severely dilated LV, LVEF 31%, no LGE, normal RV size, mildly decreased function, consistent with noncompaction cardiomyopathy  Heart failure review: - Classification: {HFCLASS:30917} - Etiology: {Cardiomyopathy:30918} - NYHA Class:  - Volume status: {volumechf:30919} - ACEi/ARB/ARNI: {HF:30752} - Aldosterone antagonist: {HF:30752} - Beta-blocker: {HF:30752} - Digoxin: {HF:30752} - Hydralazine/Nitrates:  {HF:30752} - SGLT2i: {HF:30752} - GLP-1: {GLP:30906} - Advanced therapies: {Advancedtherapies:30916} - ICD: {ICD:30901}  ASSESSMENT & PLAN:  ***  Follow up in ***  Clearnce Hasten, MD Advanced Heart Failure Mechanical Circulatory Support 08/23/23

## 2023-08-23 NOTE — Patient Instructions (Signed)
START Farxiga 10 mg daily.  Labs done today, your results will be available in MyChart, we will contact you for abnormal readings.  You are scheduled for a Cardiac Catheterization on Tuesday, March 4 with Dr.  Elwyn Lade .  1. Please arrive at the [facility] (Main Entrance A) at [facility]: [address] [place], [place] [address] at 7:00 AM (This time is 2 hour(s) before your procedure to ensure your preparation).   Free valet parking service is available. You will check in at ADMITTING. The support person will be asked to wait in the waiting room.  It is OK to have someone drop you off and come back when you are ready to be discharged.    Special note: Every effort is made to have your procedure done on time. Please understand that emergencies sometimes delay scheduled procedures.  2. Diet: Do not eat solid foods after midnight.  The patient may have clear liquids until 5am upon the day of the procedure.  3. Medication instructions in preparation for your procedure:   Contrast Allergy: No  HOLD ELIQUIS THE DAY BEFORE YOUR PROCEDURE AND THE DAY OF YOUR PROCEDURE.  HOLD FARXIGA, SPIRONOLACTONE AND LASIX THE MORNING OF YOUR PROCEDURE.  On the morning of your procedure, take any morning medicines NOT listed above.  You may use sips of water.  5. Plan to go home the same day, you will only stay overnight if medically necessary. 6. Bring a current list of your medications and current insurance cards. 7. You MUST have a responsible person to drive you home. 8. Someone MUST be with you the first 24 hours after you arrive home or your discharge will be delayed. 9. Please wear clothes that are easy to get on and off and wear slip-on shoes.   Your physician recommends that you schedule a follow-up appointment in: 1 MONTH.  If you have any questions or concerns before your next appointment please send [place] a message through [place] or call our office at [phone].    TO LEAVE A  MESSAGE FOR THE NURSE SELECT OPTION 2, PLEASE LEAVE A MESSAGE INCLUDING: YOUR NAME DATE OF BIRTH CALL BACK NUMBER REASON FOR CALL**this is important as we prioritize the call backs  YOU WILL RECEIVE A CALL BACK THE SAME DAY AS LONG AS YOU CALL BEFORE 4:00 PM  At the [facility], you and your health needs are our priority. As part of our continuing mission to provide you with exceptional heart care, we have created designated Provider Care Teams. These Care Teams include your primary Cardiologist (physician) and Advanced Practice Providers (APPs- Physician Assistants and Nurse Practitioners) who all work together to provide you with the care you need, when you need it.   You may see any of the following providers on your designated Care Team at your next follow up: Dr Arvilla Meres Dr Marca Ancona Dr. Dorthula Nettles Dr. Clearnce Hasten Amy Filbert Schilder, NP Robbie Lis, [place] [facility] [place], [place] Brynda Peon, NP [place] Lee, NP Karle Plumber, PharmD   Please be sure to bring in all your medications bottles to every appointment.    Thank you for choosing [facility]

## 2023-08-24 ENCOUNTER — Other Ambulatory Visit (HOSPITAL_COMMUNITY): Payer: Self-pay

## 2023-08-24 ENCOUNTER — Telehealth (HOSPITAL_COMMUNITY): Payer: Self-pay

## 2023-08-24 NOTE — Telephone Encounter (Signed)
Advanced Heart Failure Patient Advocate Encounter  Prior authorization for [name] has been submitted and approved. Test billing returns $4 for 90 day supply.  Key: [id number] Effective: 08/24/2023 to 08/23/2024  Burnell Blanks, CPhT Rx Patient Advocate Phone: [phone]

## 2023-09-12 ENCOUNTER — Encounter (HOSPITAL_COMMUNITY)
Admission: RE | Disposition: A | Discharge status: Home / Self Care | Payer: Self-pay | Source: Home / Self Care | Attending: Cardiology

## 2023-09-12 ENCOUNTER — Other Ambulatory Visit: Payer: Self-pay

## 2023-09-12 ENCOUNTER — Ambulatory Visit (HOSPITAL_COMMUNITY)
Admission: RE | Admit: 2023-09-12 | Discharge: 2023-09-12 | Disposition: A | Discharge status: Home / Self Care | Payer: MEDICAID | Attending: Cardiology | Admitting: Cardiology

## 2023-09-12 DIAGNOSIS — I429 Cardiomyopathy, unspecified: Secondary | ICD-10-CM | POA: Diagnosis not present

## 2023-09-12 DIAGNOSIS — I4821 Permanent atrial fibrillation: Secondary | ICD-10-CM | POA: Insufficient documentation

## 2023-09-12 DIAGNOSIS — I5022 Chronic systolic (congestive) heart failure: Secondary | ICD-10-CM | POA: Diagnosis present

## 2023-09-12 DIAGNOSIS — Z79899 Other long term (current) drug therapy: Secondary | ICD-10-CM | POA: Diagnosis not present

## 2023-09-12 DIAGNOSIS — I2729 Other secondary pulmonary hypertension: Secondary | ICD-10-CM | POA: Diagnosis not present

## 2023-09-12 DIAGNOSIS — I509 Heart failure, unspecified: Secondary | ICD-10-CM

## 2023-09-12 DIAGNOSIS — Z7901 Long term (current) use of anticoagulants: Secondary | ICD-10-CM | POA: Diagnosis not present

## 2023-09-12 HISTORY — PX: RIGHT HEART CATH: CATH118263

## 2023-09-12 LAB — POCT I-STAT EG7
Acid-base deficit: 3 mmol/L — ABNORMAL HIGH (ref 0.0–2.0)
Acid-base deficit: 4 mmol/L — ABNORMAL HIGH (ref 0.0–2.0)
Acid-base deficit: 4 mmol/L — ABNORMAL HIGH (ref 0.0–2.0)
Bicarbonate: 20.9 mmol/L (ref 20.0–28.0)
Bicarbonate: 21.4 mmol/L (ref 20.0–28.0)
Bicarbonate: 21.6 mmol/L (ref 20.0–28.0)
Calcium, Ion: 1.04 mmol/L — ABNORMAL LOW (ref 1.15–1.40)
Calcium, Ion: 1.15 mmol/L (ref 1.15–1.40)
Calcium, Ion: 1.16 mmol/L (ref 1.15–1.40)
HCT: 36 % — ABNORMAL LOW (ref 39.0–52.0)
HCT: 37 % — ABNORMAL LOW (ref 39.0–52.0)
HCT: 38 % — ABNORMAL LOW (ref 39.0–52.0)
Hemoglobin: 12.2 g/dL — ABNORMAL LOW (ref 13.0–17.0)
Hemoglobin: 12.6 g/dL — ABNORMAL LOW (ref 13.0–17.0)
Hemoglobin: 12.9 g/dL — ABNORMAL LOW (ref 13.0–17.0)
O2 Saturation: 63 %
O2 Saturation: 65 %
O2 Saturation: 75 %
Potassium: 3.3 mmol/L — ABNORMAL LOW (ref 3.5–5.1)
Potassium: 3.4 mmol/L — ABNORMAL LOW (ref 3.5–5.1)
Potassium: 3.5 mmol/L (ref 3.5–5.1)
Sodium: 140 mmol/L (ref 135–145)
Sodium: 141 mmol/L (ref 135–145)
Sodium: 142 mmol/L (ref 135–145)
TCO2: 22 mmol/L (ref 22–32)
TCO2: 23 mmol/L (ref 22–32)
TCO2: 23 mmol/L (ref 22–32)
pCO2, Ven: 35.1 mmHg — ABNORMAL LOW (ref 44–60)
pCO2, Ven: 37.8 mmHg — ABNORMAL LOW (ref 44–60)
pCO2, Ven: 38 mmHg — ABNORMAL LOW (ref 44–60)
pH, Ven: 7.358 (ref 7.25–7.43)
pH, Ven: 7.366 (ref 7.25–7.43)
pH, Ven: 7.382 (ref 7.25–7.43)
pO2, Ven: 34 mmHg (ref 32–45)
pO2, Ven: 34 mmHg (ref 32–45)
pO2, Ven: 41 mmHg (ref 32–45)

## 2023-09-12 LAB — POCT I-STAT, CHEM 8
BUN: 12 mg/dL (ref 6–20)
Calcium, Ion: 1.2 mmol/L (ref 1.15–1.40)
Chloride: 104 mmol/L (ref 98–111)
Creatinine, Ser: 1 mg/dL (ref 0.61–1.24)
Glucose, Bld: 82 mg/dL (ref 70–99)
HCT: 38 % — ABNORMAL LOW (ref 39.0–52.0)
Hemoglobin: 12.9 g/dL — ABNORMAL LOW (ref 13.0–17.0)
Potassium: 3.6 mmol/L (ref 3.5–5.1)
Sodium: 139 mmol/L (ref 135–145)
TCO2: 21 mmol/L — ABNORMAL LOW (ref 22–32)

## 2023-09-12 SURGERY — RIGHT HEART CATH
Anesthesia: LOCAL

## 2023-09-12 MED ORDER — FENTANYL CITRATE (PF) 100 MCG/2ML IJ SOLN
INTRAMUSCULAR | Status: DC | PRN
  Administered 2023-09-12: 25 ug via INTRAVENOUS

## 2023-09-12 MED ORDER — LIDOCAINE HCL (PF) 1 % IJ SOLN
INTRAMUSCULAR | Status: AC
  Filled 2023-09-12: qty 30

## 2023-09-12 MED ORDER — HEPARIN (PORCINE) IN NACL 1000-0.9 UT/500ML-% IV SOLN
INTRAVENOUS | Status: DC | PRN
  Administered 2023-09-12: 500 mL

## 2023-09-12 MED ORDER — MIDAZOLAM HCL 2 MG/2ML IJ SOLN
INTRAMUSCULAR | Status: DC | PRN
  Administered 2023-09-12: 1 mg via INTRAVENOUS

## 2023-09-12 MED ORDER — FENTANYL CITRATE (PF) 100 MCG/2ML IJ SOLN
INTRAMUSCULAR | Status: AC
  Filled 2023-09-12: qty 2

## 2023-09-12 MED ORDER — SODIUM CHLORIDE 0.9 % IV SOLN: INTRAVENOUS | Status: DC

## 2023-09-12 MED ORDER — LIDOCAINE HCL (PF) 1 % IJ SOLN
INTRAMUSCULAR | Status: DC | PRN
  Administered 2023-09-12: 5 mL

## 2023-09-12 MED ORDER — MIDAZOLAM HCL 2 MG/2ML IJ SOLN
INTRAMUSCULAR | Status: AC
  Filled 2023-09-12: qty 2

## 2023-09-12 SURGICAL SUPPLY — 8 items
CATH SWAN GANZ 7F STRAIGHT (CATHETERS) IMPLANT
KIT MICROPUNCTURE NIT STIFF (SHEATH) IMPLANT
PACK CARDIAC CATHETERIZATION (CUSTOM PROCEDURE TRAY) ×1 IMPLANT
SHEATH PINNACLE 7F 10CM (SHEATH) IMPLANT
SHEATH PROBE COVER 6X72 (BAG) IMPLANT
TRANSDUCER W/STOPCOCK (MISCELLANEOUS) IMPLANT
TUBING ART PRESS 72 MALE/FEM (TUBING) IMPLANT
WIRE MICRO SET 5FR 12 (WIRE) IMPLANT

## 2023-09-12 NOTE — Discharge Instructions (Signed)
 IJ Site Care   This sheet gives you information about how to care for yourself after your procedure. Your health care provider may also give you more specific instructions. If you have problems or questions, contact your health care provider. What can I expect after the procedure? After the procedure, it is common to have: Bruising and tenderness at the catheter insertion area. Follow these instructions at home:  Insertion site care Follow instructions from your health care provider about how to take care of your insertion site. Make sure you: Wash your hands with soap and water before you change your bandage (dressing). If soap and water are not available, use hand sanitizer. Remove your dressing as told by your health care provider. In 24 hours Check your insertion site every day for signs of infection. Check for: Redness, swelling, or pain. Pus or a bad smell. Warmth. You may shower 24-48 hours after the procedure. Do not apply powder or lotion to the site.  Activity For 24 hours after the procedure, or as directed by your health care provider: Do not push or pull heavy objects with the affected arm. Do not drive yourself home from the hospital or clinic. You may drive 24 hours after the procedure unless your health care provider tells you not to. Do not lift anything that is heavier than 10 lb (4.5 kg), or the limit that you are told, until your health care provider says that it is safe.  For 24 hours

## 2023-09-12 NOTE — Interval H&P Note (Signed)
 History and Physical Interval Note:  09/12/2023 8:21 AM  Gary [name]  has presented today for surgery, with the diagnosis of chf.  The various methods of treatment have been discussed with the patient and family. After consideration of risks, benefits and other options for treatment, the patient has consented to  Procedure(s): RIGHT HEART CATH (N/A) as a surgical intervention.  The patient's history has been reviewed, patient examined, no change in status, stable for surgery.  I have reviewed the patient's chart and labs.  Questions were answered to the patient's satisfaction.     [name]

## 2023-09-13 ENCOUNTER — Encounter (HOSPITAL_COMMUNITY): Payer: Self-pay | Admitting: Cardiology

## 2023-09-18 ENCOUNTER — Encounter (HOSPITAL_COMMUNITY): Payer: MEDICAID | Admitting: Cardiology

## 2023-10-10 ENCOUNTER — Telehealth (HOSPITAL_COMMUNITY): Payer: Self-pay

## 2023-10-10 ENCOUNTER — Ambulatory Visit (HOSPITAL_COMMUNITY)
Admission: RE | Admit: 2023-10-10 | Discharge: 2023-10-10 | Disposition: A | Discharge status: Home / Self Care | Payer: MEDICAID | Source: Ambulatory Visit | Attending: Cardiology | Admitting: Cardiology

## 2023-10-10 VITALS — BP 92/54 | HR 70 | Ht 65.0 in | Wt 118.6 lb

## 2023-10-10 DIAGNOSIS — Q261 Persistent left superior vena cava: Secondary | ICD-10-CM | POA: Insufficient documentation

## 2023-10-10 DIAGNOSIS — I5022 Chronic systolic (congestive) heart failure: Secondary | ICD-10-CM | POA: Diagnosis present

## 2023-10-10 DIAGNOSIS — I428 Other cardiomyopathies: Secondary | ICD-10-CM | POA: Diagnosis not present

## 2023-10-10 DIAGNOSIS — I4821 Permanent atrial fibrillation: Secondary | ICD-10-CM | POA: Diagnosis not present

## 2023-10-10 DIAGNOSIS — I351 Nonrheumatic aortic (valve) insufficiency: Secondary | ICD-10-CM

## 2023-10-10 DIAGNOSIS — I340 Nonrheumatic mitral (valve) insufficiency: Secondary | ICD-10-CM

## 2023-10-10 DIAGNOSIS — Z8774 Personal history of (corrected) congenital malformations of heart and circulatory system: Secondary | ICD-10-CM | POA: Insufficient documentation

## 2023-10-10 DIAGNOSIS — I083 Combined rheumatic disorders of mitral, aortic and tricuspid valves: Secondary | ICD-10-CM | POA: Diagnosis present

## 2023-10-10 DIAGNOSIS — Q208 Other congenital malformations of cardiac chambers and connections: Secondary | ICD-10-CM | POA: Diagnosis present

## 2023-10-10 MED ORDER — FARXIGA 10 MG PO TABS: 10.0000 mg | ORAL_TABLET | Freq: Every day | ORAL | 3 refills | Status: DC

## 2023-10-10 MED ORDER — ENTRESTO 24-26 MG PO TABS: 1.0000 | ORAL_TABLET | Freq: Two times a day (BID) | ORAL | 3 refills | Status: DC

## 2023-10-10 MED ORDER — SPIRONOLACTONE 25 MG PO TABS: 25.0000 mg | ORAL_TABLET | Freq: Every day | ORAL | 3 refills | Status: DC

## 2023-10-10 NOTE — Telephone Encounter (Signed)
 Referral faxed to [facility] on 10/10/23

## 2023-10-10 NOTE — Progress Notes (Signed)
 ADVANCED HEART FAILURE FOLLOW UP CLINIC NOTE  Referring Physician: Diamantina Providence, FNP  Primary Care: Diamantina Providence, FNP Primary Cardiologist:  HPI: Gary [name] is a 48 y.o. 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      Echo 05/06/19 confirmed EF 25-30% with severe MR and mild to moderate AI. RVEF moderately decreased.   CPX test 9/20 with severe HF limitation. Repeat CPX test at [facility] was improved   Echo 03/03/20 with Dr. Rosemary Holms EF read as 40-45% Moderate-severe AI, severe MR, mod-severe TR. Saw Dr Ladona Ridgel. No ICD based on EF improvement.    CPX 10/21 Peak VO2: 21.1 (56% predicted peak VO2) slope:  41.  Underwent RHC 02/2021 with RA 5, PA 61/22 (39), PCWP 27 (v waves to 35), Fick CO/CI 4.5/2.8, PVR 2.7 wood units, PAPi 7.8, persistent L SVC     SUBJECTIVE: Patient reports that overall he is doing well. He is currently working two jobs as well as taking care of his four daughters as a single father, though has a girlfriend that helps out.    PMH, current medications, allergies, social history, and family history reviewed in epic.  PHYSICAL EXAM: Vitals:   10/10/23 1328  BP: (!) 92/54  Pulse: 70  SpO2: 96%   GENERAL: Well nourished and in no apparent distress at rest.  PULM:  Normal work of breathing, clear to auscultation bilaterally. Respirations are unlabored.  CARDIAC:  JVP: mildly elevated         Irregular rate and rhythm. Loud systolic murmur that terminates early, diastolic murmur.  Trace edema. Warm and well perfused extremities. ABDOMEN: Soft, non-tender, non-distended. NEUROLOGIC: Patient is oriented x3 with no focal or lateralizing neurologic deficits.    DATA REVIEW  ECG: Afib with controlled ventricular rate     ECHO: 07/2023: LVEF 30%,  severe dilation, LVIDs 5.5, torrential MR with papillary muscle enlargement, ?parachute mitral valve  CATH: RHC 02/2021 with RA 5, PA 61/22 (39), PCWP 27 (v waves to 35), Fick CO/CI 4.5/2.8, PVR 2.7 wood units, PAPi 7.8, persistent L SVC   LHC 2020: Angiographically normal coronaries  CMR: 2019: Severely dilated LV, LVEF 31%, no LGE, normal RV size, mildly decreased function, consistent with noncompaction cardiomyopathy  Heart failure review: - Classification: Heart failure with reduced EF - Etiology: Cardiomyopathy due to LVNC - NYHA Class: II - Volume status: Euvolemic - ACEi/ARB/ARNI: Maximally tolerated dose - Aldosterone antagonist: Maximally tolerated dose - Beta-blocker: Maximally tolerated dose - Digoxin: Plan to start at a subsequent visit - Hydralazine/Nitrates: Not a candidate - SGLT2i: Maximally tolerated dose - GLP-1: Not a candidate - Advanced therapies: Actively working up - ICD: Meets criteria but heading towards advanced therapies  ASSESSMENT & PLAN:  Chronic systolic heart failure: Patient with LVNC and progressive valvular disease despite medical therapy. Prior CPX with severe HF limitation, surprisingly symptoms are described as minimal. Has been evaluated by [facility] for txp in the past, currently non tobacco smoking though does report regular alcohol use, but will stop. I am concerned that his cardiomyopathy is significantly worse than his symptoms imply, and even though his end organ function is normal his PA pressures are worsening on recent RHC. - Add SGLT2 to help with diuresis - Continue lasix 40mg  daily, occasional BID for weight  gain - Continue entresto 24/26mg  BID, spironolactone 12.5mg  daily - No room for titration - RHC to establish PA pressures - [facility] referral for eval at next visit  Atrial fibrillation: Suspect permanent at this point given LA enlargement, rate controlled. - Continue apixaban 5mg  BID, will have to be  transitioned to warfarin if listed - Can continue for RHC  Severe MR/AR: Progressive on echocardiogram and has been worsening over the past few echocardiograms. Unlikely a good clip candidate based on anatomy, concomittant AR, and progressive cardiomyopathy. Repeat RHC as above.  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 Avoid left sided access.  Follow up to be arranged with [facility].     Clearnce Hasten, MD Advanced Heart Failure Mechanical Circulatory Support 10/10/23

## 2023-10-10 NOTE — Patient Instructions (Signed)
 INCREASE Spironolactone to 25 mg daily.  You have been referred to [facility]. They will call you to arrange your appointment.  Your physician recommends that you schedule a follow-up appointment in: 4 months (August) ** PLEASE CALL THE OFFICE JUNE TO ARRANGE YOUR FOLLOW UP APPOINTMENT.**  If you have any questions or concerns before your next appointment please send [place] a message through [place] or call our office at [phone].    TO LEAVE A MESSAGE FOR THE NURSE SELECT OPTION 2, PLEASE LEAVE A MESSAGE INCLUDING: YOUR NAME DATE OF BIRTH CALL BACK NUMBER REASON FOR CALL**this is important as we prioritize the call backs  YOU WILL RECEIVE A CALL BACK THE SAME DAY AS LONG AS YOU CALL BEFORE 4:00 PM  At the [facility], you and your health needs are our priority. As part of our continuing mission to provide you with exceptional heart care, we have created designated Provider Care Teams. These Care Teams include your primary Cardiologist (physician) and Advanced Practice Providers (APPs- Physician Assistants and Nurse Practitioners) who all work together to provide you with the care you need, when you need it.   You may see any of the following providers on your designated Care Team at your next follow up: Dr Arvilla Meres Dr Marca Ancona Dr. Dorthula Nettles Dr. Clearnce Hasten Amy Filbert Schilder, NP Robbie Lis, [place] [facility] [place], [place] Brynda Peon, NP [place] Lee, NP Clarisa Kindred, NP Karle Plumber, PharmD Enos Fling, PharmD   Please be sure to bring in all your medications bottles to every appointment.    Thank you for choosing  [facility]

## 2024-01-04 ENCOUNTER — Encounter: Payer: Self-pay | Admitting: Cardiology

## 2024-01-04 ENCOUNTER — Other Ambulatory Visit (HOSPITAL_COMMUNITY): Payer: Self-pay

## 2024-01-04 ENCOUNTER — Ambulatory Visit: Payer: MEDICAID | Attending: Cardiology | Admitting: Cardiology

## 2024-01-04 VITALS — BP 93/51 | HR 81 | Ht 65.0 in | Wt 119.0 lb

## 2024-01-04 DIAGNOSIS — I5022 Chronic systolic (congestive) heart failure: Secondary | ICD-10-CM | POA: Diagnosis present

## 2024-01-04 DIAGNOSIS — I428 Other cardiomyopathies: Secondary | ICD-10-CM | POA: Diagnosis present

## 2024-01-04 DIAGNOSIS — I340 Nonrheumatic mitral (valve) insufficiency: Secondary | ICD-10-CM | POA: Insufficient documentation

## 2024-01-04 DIAGNOSIS — I482 Chronic atrial fibrillation, unspecified: Secondary | ICD-10-CM | POA: Diagnosis present

## 2024-01-04 DIAGNOSIS — I351 Nonrheumatic aortic (valve) insufficiency: Secondary | ICD-10-CM | POA: Diagnosis present

## 2024-01-04 MED ORDER — FARXIGA 10 MG PO TABS: 10.0000 mg | ORAL_TABLET | Freq: Every day | ORAL | 3 refills | Status: DC

## 2024-01-04 MED ORDER — SPIRONOLACTONE 25 MG PO TABS: 25.0000 mg | ORAL_TABLET | Freq: Every day | ORAL | 3 refills | Status: DC

## 2024-01-04 MED ORDER — METOPROLOL SUCCINATE ER 25 MG PO TB24: ORAL_TABLET | ORAL | 3 refills | Status: DC

## 2024-01-04 MED ORDER — ENTRESTO 24-26 MG PO TABS: 1.0000 | ORAL_TABLET | Freq: Two times a day (BID) | ORAL | 3 refills | Status: DC

## 2024-01-04 MED ORDER — FUROSEMIDE 40 MG PO TABS: 40.0000 mg | ORAL_TABLET | Freq: Every day | ORAL | 3 refills | Status: DC

## 2024-01-04 MED ORDER — APIXABAN 5 MG PO TABS: 5.0000 mg | ORAL_TABLET | Freq: Two times a day (BID) | ORAL | 3 refills | Status: DC

## 2024-01-04 NOTE — Progress Notes (Signed)
 Cardiology Office Note:  .   Date:  01/04/2024  ID:  Gary [name], DOB 01/24/1976, MRN [id number] PCP: Gary Nell SAILOR, FNP  [facility] Providers Cardiologist:  Gary Lawrence, MD PCP: Gary Nell SAILOR, FNP  Chief Complaint  Patient presents with   HFrEF      History of Present Illness: Gary    Gary [name] is a 48 y.o. 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   Patient was seen by Dr. Zenaida in advanced heart failure clinic in 08/2023 and felt to have worsening MR and progressive cardiomyopathy.  Subsequently, he underwent right heart catheterization which showed normal filling pressures and cardiac output, but cardiopulmonary exercise stress test was recommended due to worsening functional status.  He denies any overt dyspnea, orthopnea, PND, or symptoms.   Vitals:   01/04/24 1526  BP: (!) 93/51  Pulse: 81  SpO2: 96%      ROS:  Review of Systems  Cardiovascular:  Negative for chest pain, dyspnea on exertion, leg swelling, palpitations and syncope.     Studies Reviewed: Gary    EKG 07/18/2023: Atrial fibrillation Left ventricular hypertrophy with QRS widening and repolarization abnormality ( Sokolow-Lyon , Cornell product , Romhilt-Estes ) Prolonged QT When compared with ECG of 13-Jul-2021 21:16, No significant change noted  Labs 09/2023: Hb 12.6 Cr 1.0, K 3.4 ProBNP 2149     RHC 10/2023: HEMODYNAMICS: RA:       6 mmHg (mean) RV:       47/2, 6 mmHg PA:       47/20 mmHg (33 mean) PCWP: 17 mmHg (mean) with v waves to 29                                      Estimated Fick CO/CI   4.54L/min, 2.76L/min/m2       Thermodilution CO/CI   4.47L/min, 2.72L/min/m2                                      TPG  16  mmHg                                               PVR  3.5 Wood Units  PAPi  4.5        IMPRESSION: Normal right and mildly elevated left sided filling pressures. V waves consistent with known MR. Preserved cardiac output by TD. 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   RECOMMENDATIONS: Serial CPXs to evaluate for worsening functional status Difficult R internal jugular access, avoid L given persistent SVC  Echocardiogram 07/2023:  1. Consider consultation with advanced heart failure or congenital  cardiology.   2. Dilated coronary sinus. Evidence on prior chest CT of persistent left  SVC.   3. Repaired atrial septum, no residual shunt seen.   4. Left ventricular non-compaction cardiomyopathy. Left ventricular  ejection fraction, by estimation, is 30%. The left ventricle has severely  decreased function. The left ventricle demonstrates global hypokinesis.  The left ventricular  internal cavity  size was severely dilated. There is mild left ventricular hypertrophy.  Left ventricular diastolic function could not be evaluated.   5. Right ventricular systolic function is moderately reduced. The right  ventricular size is mildly enlarged. Severely increased right ventricular  wall thickness. There is mildly elevated pulmonary artery systolic  pressure. The estimated right  ventricular systolic pressure is 40.7 mmHg.   6. Left atrial size was severely dilated.   7. Right atrial size was mild to moderately dilated.   8. 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 Torrential MR. The mitral valve is abnormal. Severe mitral  valve regurgitation. Mild mitral stenosis. The mean mitral valve gradient  is 5.3 mmHg with average heart  rate of 49 bpm.   9. Tricuspid valve regurgitation is mild to moderate.  10. The aortic valve was not well visualized. Aortic valve regurgitation  is severe. No aortic stenosis is present.  11. The inferior vena cava is  normal in size with greater than 50%  respiratory variability, suggesting right atrial pressure of 3 mmHg.     Physical Exam:   Physical Exam Vitals and nursing note reviewed.  Constitutional:      General: He is not in acute distress. Neck:     Vascular: No JVD.   Cardiovascular:     Rate and Rhythm: Normal rate. Rhythm irregular.     Heart sounds: Murmur heard.     High-pitched blowing decrescendo early diastolic murmur is present with a grade of 2/4 at the upper right sternal border radiating to the apex.  Pulmonary:     Effort: Pulmonary effort is normal.     Breath sounds: Normal breath sounds. No wheezing or rales.   Musculoskeletal:     Right lower leg: No edema.     Left lower leg: No edema.    Risk Assessment/Calculations:    CHA2DS2-VASc Score = 1   This indicates a 0.6% annual risk of stroke. The patient's score is based upon: CHF History: 1 HTN History: 0 Diabetes History: 0 Stroke History: 0 Vascular Disease History: 0 Age Score: 0 Gender Score: 0       VISIT DIAGNOSES:   ICD-10-CM   1. Noncompaction cardiomyopathy (HCC)  I42.8     2. Chronic systolic heart failure (HCC)  P49.77 spironolactone  (ALDACTONE ) 25 MG tablet    sacubitril -valsartan  (ENTRESTO ) 24-26 MG    metoprolol  succinate (TOPROL -XL) 25 MG 24 hr tablet    furosemide  (LASIX ) 40 MG tablet    FARXIGA  10 MG TABS tablet    3. Chronic atrial fibrillation (HCC)  I48.20 apixaban  (ELIQUIS ) 5 MG TABS tablet    4. Nonrheumatic aortic valve insufficiency  I35.1     5. Severe mitral regurgitation  I34.0         ASSESSMENT AND PLAN: .    Gary [name] is a 48 y.o. 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 Recently evaluated by heart failure clinic, considering cardiopulmonary exercise stress  test due to advanced noncompaction cardiomyopathy with severe MR. Continue GDMT including Entresto  24-26 mg twice daily, metoprolol  succinate 25 mg daily, spironolactone  25 mg daily, Farxiga  10 mg daily.refilled all the meds.   Continue follow-up with 11.  It [birth year] his birthday today.  He just enjoyed a meal at [employer]  Cautioned him regarding salt  intake.  Aortic and mitral regurgitation: Unlikely to be a candidate for MitraClip due to concomitant AR.  Continue management of heart failure as above.   Permanent atrial fibrillation: Rate controlled. CHA2DSVasc score 1 with annual  Stroke risk 0.6%. However, given his non-compaction cardiomyopathy, his stroke risk is higher.  Continue anticoagulation with apixaban  5 mg bid.     F/u in 6 months  Signed, Gary JINNY Lawrence, MD

## 2024-01-04 NOTE — Patient Instructions (Signed)
 Medication Instructions:   Your physician recommends that you continue on your current medications as directed. Please refer to the Current Medication list given to you today.  *If you need a refill on your cardiac medications before your next appointment, please call your pharmacy*   Follow-Up: At [facility], you and your health needs are our priority.  As part of our continuing mission to provide you with exceptional heart care, our providers are all part of one team.  This team includes your primary Cardiologist (physician) and Advanced Practice Providers or APPs (Physician Assistants and Nurse Practitioners) who all work together to provide you with the care you need, when you need it.  Your next appointment:   6 month(s)  Provider:   Dr. [place]

## 2024-01-05 ENCOUNTER — Ambulatory Visit: Payer: MEDICAID | Admitting: Cardiology

## 2024-01-05 ENCOUNTER — Encounter: Payer: Self-pay | Admitting: Cardiology

## 2024-02-27 ENCOUNTER — Telehealth (HOSPITAL_COMMUNITY): Payer: Self-pay | Admitting: Cardiology

## 2024-03-03 ENCOUNTER — Emergency Department (HOSPITAL_COMMUNITY)
Admission: EM | Admit: 2024-03-03 | Discharge: 2024-03-03 | Disposition: A | Discharge status: Home / Self Care | Payer: MEDICAID | Attending: Emergency Medicine | Admitting: Emergency Medicine

## 2024-03-03 ENCOUNTER — Encounter (HOSPITAL_COMMUNITY): Payer: Self-pay

## 2024-03-03 DIAGNOSIS — N179 Acute kidney failure, unspecified: Secondary | ICD-10-CM | POA: Insufficient documentation

## 2024-03-03 DIAGNOSIS — Z7901 Long term (current) use of anticoagulants: Secondary | ICD-10-CM | POA: Insufficient documentation

## 2024-03-03 DIAGNOSIS — I509 Heart failure, unspecified: Secondary | ICD-10-CM | POA: Diagnosis not present

## 2024-03-03 DIAGNOSIS — R197 Diarrhea, unspecified: Secondary | ICD-10-CM | POA: Insufficient documentation

## 2024-03-03 DIAGNOSIS — E860 Dehydration: Secondary | ICD-10-CM | POA: Insufficient documentation

## 2024-03-03 LAB — URINALYSIS, ROUTINE W REFLEX MICROSCOPIC
Bacteria, UA: NONE SEEN
Bilirubin Urine: NEGATIVE
Glucose, UA: >=500 mg/dL — AB
Hgb urine dipstick: NEGATIVE
Ketones, ur: NEGATIVE mg/dL
Leukocytes,Ua: NEGATIVE
Nitrite: NEGATIVE
Protein, ur: 30 mg/dL — AB
Specific Gravity, Urine: 1.011 (ref 1.005–1.030)
pH: 5 (ref 5.0–8.0)

## 2024-03-03 LAB — COMPREHENSIVE METABOLIC PANEL WITH GFR
ALT: 18 U/L (ref 0–44)
AST: 21 U/L (ref 15–41)
Albumin: 3.7 g/dL (ref 3.5–5.0)
Alkaline Phosphatase: 90 U/L (ref 38–126)
Anion gap: 10 (ref 5–15)
BUN: 45 mg/dL — ABNORMAL HIGH (ref 6–20)
CO2: 16 mmol/L — ABNORMAL LOW (ref 22–32)
Calcium: 8.8 mg/dL — ABNORMAL LOW (ref 8.9–10.3)
Chloride: 103 mmol/L (ref 98–111)
Creatinine, Ser: 2.34 mg/dL — ABNORMAL HIGH (ref 0.61–1.24)
GFR, Estimated: 33 mL/min — ABNORMAL LOW (ref >60)
Glucose, Bld: 160 mg/dL — ABNORMAL HIGH (ref 70–99)
Potassium: 4.7 mmol/L (ref 3.5–5.1)
Sodium: 129 mmol/L — ABNORMAL LOW (ref 135–145)
Total Bilirubin: 1.8 mg/dL — ABNORMAL HIGH (ref 0.0–1.2)
Total Protein: 6.8 g/dL (ref 6.5–8.1)

## 2024-03-03 LAB — CBC
HCT: 43.7 % (ref 39.0–52.0)
Hemoglobin: 15 g/dL (ref 13.0–17.0)
MCH: 30.8 pg (ref 26.0–34.0)
MCHC: 34.3 g/dL (ref 30.0–36.0)
MCV: 89.7 fL (ref 80.0–100.0)
Platelets: 201 K/uL (ref 150–400)
RBC: 4.87 MIL/uL (ref 4.22–5.81)
RDW: 12.6 % (ref 11.5–15.5)
WBC: 7.8 K/uL (ref 4.0–10.5)
nRBC: 0 % (ref 0.0–0.2)

## 2024-03-03 LAB — MAGNESIUM: Magnesium: 2.4 mg/dL (ref 1.7–2.4)

## 2024-03-03 LAB — LIPASE, BLOOD: Lipase: 24 U/L (ref 11–51)

## 2024-03-03 MED ORDER — SODIUM CHLORIDE 0.9 % IV BOLUS
500.0000 mL | Freq: Once | INTRAVENOUS | Status: AC
Start: 2024-03-03 — End: 2024-03-03
  Administered 2024-03-03: 500 mL via INTRAVENOUS

## 2024-03-03 MED ORDER — SODIUM CHLORIDE 0.9 % IV BOLUS
500.0000 mL | Freq: Once | INTRAVENOUS | Status: AC
  Administered 2024-03-03: 500 mL via INTRAVENOUS

## 2024-03-03 MED ORDER — SODIUM CHLORIDE 0.9 % IV BOLUS
1000.0000 mL | Freq: Once | INTRAVENOUS | Status: AC
  Administered 2024-03-03: 1000 mL via INTRAVENOUS

## 2024-03-03 NOTE — Discharge Instructions (Signed)
 You were seen in the emergency department for diarrhea and feeling very weak.  Your blood pressure was low and your renal function had worsened.  This is likely due to dehydration.  You were given IV fluids with some improvement in your blood pressure and weakness.  Please monitor your blood pressure and follow-up with your primary care doctor and cardiology team.  Return if any worsening or concerning symptoms

## 2024-03-03 NOTE — ED Provider Notes (Signed)
 [place] EMERGENCY DEPARTMENT AT [facility] Provider Note   CSN: [id number] Arrival date & time: 03/03/24  1205     Patient presents with: Diarrhea   Gary [name] is a 48 y.o. male.  He has a history of heart failure atrial fibrillation on anticoagulation, aortic valve insufficiency.  Complaining of 3 days of diarrhea.  Associated with feeling generally weak.  1 episode of vomiting.  No fever no significant abdominal pain.  Mild cough.  No recent travel or sick contacts.  Has tried nothing for his symptoms.  Was at work today and felt too weak so decided to come appear for further evaluation.  Did have a course of antibiotics last month for pneumonia.  {Add pertinent medical, surgical, social history, OB history to YEP:67052} The history is provided by the patient.  Diarrhea Quality:  Watery Severity:  Moderate Onset quality:  Gradual Duration:  3 days Timing:  Intermittent Progression:  Unchanged Relieved by:  None tried Associated symptoms: cough and vomiting   Associated symptoms: no abdominal pain, no chills, no diaphoresis and no fever   Risk factors: recent antibiotic use   Risk factors: no sick contacts, no suspicious food intake and no travel to endemic areas        Prior to Admission medications   Medication Sig Start Date End Date Taking? Authorizing Provider  apixaban  (ELIQUIS ) 5 MG TABS tablet Take 1 tablet (5 mg total) by mouth 2 (two) times daily. 01/04/24   Patwardhan, Newman JINNY, MD  FARXIGA  10 MG TABS tablet Take 1 tablet (10 mg total) by mouth daily before breakfast. 01/04/24   Patwardhan, Newman JINNY, MD  furosemide  (LASIX ) 40 MG tablet Take 1 tablet (40 mg total) by mouth daily. 01/04/24   Patwardhan, Newman JINNY, MD  metoprolol  succinate (TOPROL -XL) 25 MG 24 hr tablet TAKE 1 TABLET(25 MG) BY MOUTH DAILY 01/04/24   Patwardhan, Newman JINNY, MD  sacubitril -valsartan  (ENTRESTO ) 24-26 MG Take 1 tablet by mouth 2 (two) times daily. 01/04/24   Patwardhan, Newman JINNY,  MD  spironolactone  (ALDACTONE ) 25 MG tablet Take 1 tablet (25 mg total) by mouth daily. 01/04/24   Patwardhan, Newman JINNY, MD    Allergies: Aspirin     Review of Systems  Constitutional:  Negative for chills, diaphoresis and fever.  Respiratory:  Positive for cough.   Cardiovascular:  Negative for chest pain.  Gastrointestinal:  Positive for diarrhea and vomiting. Negative for abdominal pain.  Genitourinary:  Negative for dysuria.    Updated Vital Signs BP (!) 127/99   Pulse 72   Temp (!) 97.1 F (36.2 C) (Oral)   Resp (!) 24   SpO2 99%   Physical Exam Vitals and nursing note reviewed.  Constitutional:      General: He is not in acute distress.    Appearance: He is well-developed.  HENT:     Head: Normocephalic and atraumatic.  Eyes:     Conjunctiva/sclera: Conjunctivae normal.  Cardiovascular:     Rate and Rhythm: Normal rate. Rhythm irregular.     Heart sounds: Murmur heard.  Pulmonary:     Effort: Pulmonary effort is normal. No respiratory distress.     Breath sounds: Normal breath sounds.  Abdominal:     Palpations: Abdomen is soft.     Tenderness: There is no abdominal tenderness. There is no guarding or rebound.  Musculoskeletal:     Cervical back: Neck supple.     Right lower leg: No edema.     Left lower  leg: No edema.  Skin:    General: Skin is warm and dry.     Capillary Refill: Capillary refill takes less than 2 seconds.  Neurological:     General: No focal deficit present.     Mental Status: He is alert.     (all labs ordered are listed, but only abnormal results are displayed) Labs Reviewed  CBC  LIPASE, BLOOD  COMPREHENSIVE METABOLIC PANEL WITH GFR  URINALYSIS, ROUTINE W REFLEX MICROSCOPIC    EKG: None  Radiology: No results found.  {Document cardiac monitor, telemetry assessment procedure when appropriate:32947} Procedures   Medications Ordered in the ED  sodium chloride  0.9 % bolus 500 mL (has no administration in time range)       {Click here for ABCD2, HEART and other calculators REFRESH Note before signing:1}                              Medical Decision Making Amount and/or Complexity of Data Reviewed Labs: ordered.   This patient complains of ***; this involves an extensive number of treatment Options and is a complaint that carries with it a high risk of complications and morbidity. The differential includes ***  I ordered, reviewed and interpreted labs, which included *** I ordered medication *** and reviewed PMP when indicated. I ordered imaging studies which included *** and I independently    visualized and interpreted imaging which showed *** Additional history obtained from *** Previous records obtained and reviewed *** I consulted *** and discussed lab and imaging findings and discussed disposition.  Cardiac monitoring reviewed, *** Social determinants considered, *** Critical Interventions: ***  After the interventions stated above, I reevaluated the patient and found *** Admission and further testing considered, ***   {Document critical care time when appropriate  Document review of labs and clinical decision tools ie CHADS2VASC2, etc  Document your independent review of radiology images and any outside records  Document your discussion with family members, caretakers and with consultants  Document social determinants of health affecting pt's care  Document your decision making why or why not admission, treatments were needed:32947:::1}   Final diagnoses:  None    ED Discharge Orders     None

## 2024-03-03 NOTE — ED Notes (Signed)
 Pt ambulated to restroom unassisted and states he feels his normal when ambulating.

## 2024-03-03 NOTE — ED Triage Notes (Signed)
 Pt has been having diarrhea for the past couple of days, states he did vomit once today.  States he is feeling very weak and tired. Pt left work today because he feels so bad. Pt states he has not been able to sleep lately. Pt states he has CHF as well.

## 2024-03-03 NOTE — ED Notes (Signed)
 Gave pt a breakfast sandwich and juice.

## 2024-03-07 ENCOUNTER — Ambulatory Visit (HOSPITAL_COMMUNITY)
Admission: RE | Admit: 2024-03-07 | Discharge: 2024-03-07 | Disposition: A | Discharge status: Home / Self Care | Payer: MEDICAID | Source: Ambulatory Visit | Attending: Cardiology | Admitting: Cardiology

## 2024-03-07 ENCOUNTER — Encounter (HOSPITAL_COMMUNITY): Payer: Self-pay | Admitting: Cardiology

## 2024-03-07 VITALS — BP 88/50 | HR 64 | Wt 113.8 lb

## 2024-03-07 DIAGNOSIS — I482 Chronic atrial fibrillation, unspecified: Secondary | ICD-10-CM

## 2024-03-07 DIAGNOSIS — Q261 Persistent left superior vena cava: Secondary | ICD-10-CM | POA: Insufficient documentation

## 2024-03-07 DIAGNOSIS — I5022 Chronic systolic (congestive) heart failure: Secondary | ICD-10-CM | POA: Insufficient documentation

## 2024-03-07 DIAGNOSIS — Z7901 Long term (current) use of anticoagulants: Secondary | ICD-10-CM | POA: Insufficient documentation

## 2024-03-07 DIAGNOSIS — I428 Other cardiomyopathies: Secondary | ICD-10-CM

## 2024-03-07 DIAGNOSIS — I4891 Unspecified atrial fibrillation: Secondary | ICD-10-CM | POA: Insufficient documentation

## 2024-03-07 DIAGNOSIS — Z79899 Other long term (current) drug therapy: Secondary | ICD-10-CM | POA: Diagnosis not present

## 2024-03-07 DIAGNOSIS — I080 Rheumatic disorders of both mitral and aortic valves: Secondary | ICD-10-CM | POA: Diagnosis not present

## 2024-03-07 DIAGNOSIS — Z8774 Personal history of (corrected) congenital malformations of heart and circulatory system: Secondary | ICD-10-CM | POA: Diagnosis not present

## 2024-03-07 DIAGNOSIS — I351 Nonrheumatic aortic (valve) insufficiency: Secondary | ICD-10-CM

## 2024-03-07 NOTE — Patient Instructions (Addendum)
 There has been no changes to your medications   You are scheduled for a Cardiopulmonary Exercise (CPX) Test as [facility] on: Date:   03/21/2024   Time: 11.00 am    Expect to be in the lab for 2 hours. Please plan to arrive 30 minutes prior to your appointment. You may be asked to reschedule your test if you arrive 20 minutes or more after your scheduled appointment time.  Main Campus address: [address] [place], [place] [address] You may arrive to the Main Entrance A or Entrance C (free valet parking is available at both). -Main Entrance A (on [address]) :proceed to admitting for check in -Entrance C (on [facility]): proceed to [employer] parking or under hospital deck parking using this code __1420_______  Check In: [facility] waiting room (1st floor)   General Instructions for the day of the test (Please follow all instructions from your physician): Refrain from ingesting a heavy meal, alcohol, or caffeine or using tobacco products within 2 hours of the test (DO NOT FAST for mare than 8 hours). You may have all other non-alcoholic, non -caffeinated beverage,a light snack (crackers,a piece of fruit, carrot sticks, toast bagel,etc) up to your appointment. Avoid significant exertion or exercise within 24 hours of your test. Be prepared to exercise and sweat. Your clothing should permit freedom of movement and include walking or running shoes. Women bring loose fitting short sleeved blouse.  This evaluation may be fatiguing and you may wish ti have someone accompany you to the assessment to drive you home afterward. Bring a list of your medications with you, including dosage and frequency you take the medications (  I.e.,once per day, twice per day, etc). Take all medications as prescribed, unless noted below or instructed to do so by your physician.  Please do not take the following medications prior to your  CPX:  _________________________________________________  _________________________________________________  Brief description of the test: A brief lung test will be performed. This will involve you taking deep breaths and blowing hard and fast through your mouth. During these , a clip will be on your nose and you will be breathing through a breathing device.   For the exercise portion of the test you will be walking on a treadmill, or riding a stationary bike, to your maximal effor or until symptoms such as chest pain, shortness of breath, leg pain or dizziness limit your exercise. You will be breathing in and out of a breathing device through your mouth (a clip will be on your nose again). Your heart rate, ECG, blood pressure, oxygen saturations, breathing rate and depth, amount of oxygen you consume and amount of carbon dioxide you produce will be measured and monitored throughout the exercise test.  If you need to cancel or reschedule your appointment please call [phone] If you have further questions please call your physician or Damien Nunnery at [phone]  Your physician recommends that you schedule a follow-up appointment in: 3 months ( November) ** PLEASE CALL THE OFFICE IN OCTOBER TO ARRANGE YOUR FOLLOW UP APPOINTMENT.**  If you have any questions or concerns before your next appointment please send us  a message through [facility] or call our office at [phone].    TO LEAVE A MESSAGE FOR THE NURSE SELECT OPTION 2, PLEASE LEAVE A MESSAGE INCLUDING: YOUR NAME DATE OF BIRTH CALL BACK NUMBER REASON FOR CALL**this is important as we prioritize the call backs  YOU WILL RECEIVE A CALL BACK THE SAME  DAY AS LONG AS YOU CALL BEFORE 4:00 PM  At the [facility], you and your health needs are our priority. As part of our continuing mission to provide you with exceptional heart care, we have created designated Provider Care Teams. These Care Teams include your primary  Cardiologist (physician) and Advanced Practice Providers (APPs- Physician Assistants and Nurse Practitioners) who all work together to provide you with the care you need, when you need it.   You may see any of the following providers on your designated Care Team at your next follow up: Dr Toribio Fuel Dr Ezra Shuck Dr. Ria Commander Dr. [name] Amy Lenetta, NP Caffie Shed, [place] [facility] [place], [place] Beckey Coe, NP [place] Lee, NP Ellouise Class, NP Tinnie Redman, PharmD Jaun Bash, PharmD   Please be sure to bring in all your medications bottles to every appointment.    Thank you for choosing [facility]

## 2024-03-12 NOTE — Progress Notes (Signed)
 ADVANCED HEART FAILURE FOLLOW UP CLINIC NOTE  Referring Physician: Lenon Nell SAILOR, FNP  Primary Care: Lenon Nell SAILOR, FNP Primary Cardiologist:  HPI: Gary [name] is a 48 y.o. 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      Echo 05/06/19 confirmed EF 25-30% with severe MR and mild to moderate AI. RVEF moderately decreased.   CPX test 9/20 with severe HF limitation. Repeat CPX test at [facility] was improved   Echo 03/03/20 with Dr. Elmira EF read as 40-45% Moderate-severe AI, severe MR, mod-severe TR. Saw Dr Waddell. No ICD based on EF improvement.    CPX 10/21 Peak VO2: 21.1 (56% predicted peak VO2) slope:  41.  Underwent RHC 02/2021 with RA 5, PA 61/22 (39), PCWP 27 (v waves to 35), Fick CO/CI 4.5/2.8, PVR 2.7 wood units, PAPi 7.8, persistent L SVC     SUBJECTIVE: Overall reports doing well. Seen in the ED for a diarrheal illness after a trip to [place] , but now recovered. No change in his symptoms of exertional shortness of breath or swelling. Asking questions about driving after transplant. Discussed plan for exercise testing, hopefully prior to visit with Dr. Devore.   PMH, current medications, allergies, social history, and family history reviewed in epic.  PHYSICAL EXAM: Vitals:   03/07/24 1143  BP: (!) 88/50  Pulse: 64  SpO2: 97%   GENERAL: NAD, well appearing PULM:  Normal work of breathing, CTAB CARDIAC:  JVP: mildly elevated         Normal rate with regular rhythm. Loud, early terminating systolic murmur best heard at the apex, diastolic murmur, no edema. Warm and well perfused extremities. ABDOMEN: Soft, non-tender, non-distended. NEUROLOGIC: Patient is oriented x3 with no focal or lateralizing neurologic deficits.     DATA  REVIEW  ECG: Afib with controlled ventricular rate    ECHO: 07/2023: LVEF 30%,  severe dilation, LVIDs 5.5, torrential MR with papillary muscle enlargement, ?parachute mitral valve  CATH: RHC 02/2021 with RA 5, PA 61/22 (39), PCWP 27 (v waves to 35), Fick CO/CI 4.5/2.8, PVR 2.7 wood units, PAPi 7.8, persistent L SVC  RHC: RA 6, PA 47/20 (33), PCWP 17 with v waves to 29, Fick CO/CI 4.54/2.76, TD CO/CI 4.47/2.72, PVR 3.5 wood units  LHC 2020: Angiographically normal coronaries  CMR: 2019: Severely dilated LV, LVEF 31%, no LGE, normal RV size, mildly decreased function, consistent with noncompaction cardiomyopathy   ASSESSMENT & PLAN:  Chronic systolic heart failure: Patient with LVNC and progressive valvular disease despite medical therapy. Prior CPX with severe HF limitation, recent RHC with preserved cardiac output/index. Mild elevation in PVR, but given elevated filling pressures suspect would be reversible. CPX ordered and pending for 03/21/24, will have a telephone visit with Dr. Devore later in the month.  - Continue farxiga  10mg  daily - Continue lasix  40mg  daily - Continue entresto  24/26mg  BID, spironolactone  25mg  daily - Continue metoprolol  succinate 25mg  daily - CPX testing 9/11 - Follow up with [facility] for eventual consideration of txp - If CPX progresses, would repeat RHC at that time - BP, HR limit further titration  Atrial fibrillation: Suspect permanent at this point given LA enlargement, rate controlled. - Continue apixaban  5mg  BID, will have to be transitioned to warfarin if listed  Severe MR/AR: Progressive on echocardiogram  and has been worsening over the past few echocardiograms. Not a clip candidate based on anatomy, concomittant AR, and progressive cardiomyopathy.  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 Avoid left sided access.  Follow up in 3  months    Gary Brownie, MD Advanced [facility] Failure Mechanical Circulatory Support 03/12/24

## 2024-03-21 ENCOUNTER — Ambulatory Visit (HOSPITAL_COMMUNITY): Payer: MEDICAID | Attending: Cardiology

## 2024-03-21 DIAGNOSIS — I5022 Chronic systolic (congestive) heart failure: Secondary | ICD-10-CM | POA: Diagnosis not present

## 2024-04-23 ENCOUNTER — Ambulatory Visit (HOSPITAL_COMMUNITY): Payer: Self-pay | Admitting: Cardiology

## 2024-06-26 ENCOUNTER — Encounter: Payer: Self-pay | Admitting: Cardiology

## 2024-06-26 ENCOUNTER — Ambulatory Visit: Payer: MEDICAID | Admitting: Cardiology

## 2024-06-26 VITALS — BP 80/58 | HR 64 | Ht 65.0 in | Wt 122.4 lb

## 2024-06-26 DIAGNOSIS — I5022 Chronic systolic (congestive) heart failure: Secondary | ICD-10-CM | POA: Insufficient documentation

## 2024-06-26 DIAGNOSIS — I5020 Unspecified systolic (congestive) heart failure: Secondary | ICD-10-CM | POA: Insufficient documentation

## 2024-06-26 DIAGNOSIS — I482 Chronic atrial fibrillation, unspecified: Secondary | ICD-10-CM | POA: Insufficient documentation

## 2024-06-26 DIAGNOSIS — N179 Acute kidney failure, unspecified: Secondary | ICD-10-CM | POA: Insufficient documentation

## 2024-06-26 DIAGNOSIS — I428 Other cardiomyopathies: Secondary | ICD-10-CM | POA: Insufficient documentation

## 2024-06-26 LAB — COMPREHENSIVE METABOLIC PANEL WITH GFR
ALT: 15 IU/L (ref 0–44)
AST: 16 IU/L (ref 0–40)
Albumin: 3.9 g/dL — ABNORMAL LOW (ref 4.1–5.1)
Alkaline Phosphatase: 114 IU/L (ref 47–123)
BUN/Creatinine Ratio: 12 (ref 9–20)
BUN: 13 mg/dL (ref 6–24)
Bilirubin Total: 2 mg/dL — ABNORMAL HIGH (ref 0.0–1.2)
CO2: 19 mmol/L — ABNORMAL LOW (ref 20–29)
Calcium: 8.5 mg/dL — ABNORMAL LOW (ref 8.7–10.2)
Chloride: 102 mmol/L (ref 96–106)
Creatinine, Ser: 1.09 mg/dL (ref 0.76–1.27)
Globulin, Total: 2 g/dL (ref 1.5–4.5)
Glucose: 86 mg/dL (ref 70–99)
Potassium: 3.8 mmol/L (ref 3.5–5.2)
Sodium: 136 mmol/L (ref 134–144)
Total Protein: 5.9 g/dL — ABNORMAL LOW (ref 6.0–8.5)
eGFR: 83 mL/min/1.73 (ref >59)

## 2024-06-26 MED ORDER — APIXABAN 5 MG PO TABS: 5.0000 mg | ORAL_TABLET | Freq: Two times a day (BID) | ORAL | 3 refills | Status: AC

## 2024-06-26 MED ORDER — FUROSEMIDE 40 MG PO TABS: 40.0000 mg | ORAL_TABLET | Freq: Every day | ORAL | 3 refills | Status: AC

## 2024-06-26 MED ORDER — METOPROLOL SUCCINATE ER 25 MG PO TB24: ORAL_TABLET | ORAL | 3 refills | Status: AC

## 2024-06-26 MED ORDER — FARXIGA 10 MG PO TABS: 10.0000 mg | ORAL_TABLET | Freq: Every day | ORAL | 3 refills | Status: AC

## 2024-06-26 MED ORDER — SACUBITRIL-VALSARTAN 24-26 MG PO TABS: 1.0000 | ORAL_TABLET | Freq: Two times a day (BID) | ORAL | 3 refills | Status: DC

## 2024-06-26 MED ORDER — SPIRONOLACTONE 25 MG PO TABS: 12.5000 mg | ORAL_TABLET | Freq: Every day | ORAL | 3 refills | Status: AC

## 2024-06-26 NOTE — Progress Notes (Signed)
 Cardiology Office Note:  .   Date:  06/26/2024  ID:  Oneil JINNY Mate, DOB [birth year], MRN [id number] PCP: Lenon Nell SAILOR, FNP  [facility] Providers Cardiologist:  Newman Lawrence, MD PCP: Lenon Nell SAILOR, FNP  Chief Complaint  Patient presents with   Cardiomyopathy      History of Present Illness: SABRA    DIEM PAGNOTTA is a 48 y.o. 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   Patient was seen at [facility] emergency room in 02/2024 with complaints of 3 days of diarrhea.  He was hypotensive with SBP in 70s.  His lab work was significantly abnormal with creatinine of 2.34, sodium 129, mildly elevated total bilirubin at 1.8.  He was given IV fluids with improvement in blood pressure.  Diarrhea had also improved.  He was discharged home from ED.  He was seen by Dr. Zenaida in heart failure clinic 4 days later.  He recommended follow-up with review for eventual consideration for transplant.  However, I do not see any follow-up labs performed then.  He was subsequently seen on a telemedicine visit by Dr. Devore at [facility] advanced heart failure clinic.  He was recommended to undergo cardiopulmonary exercise testing and follow-up in 6 months.  Patient underwent cardiopulmonary exercise stress testing on 03/25/2024.  This showed gastric change consistent with severe heart failure mutation, along with moderate restriction on spirometry.  Consideration should be given to advance therapies, if clinically indicated.  Patient is here for follow-up.  He continues working regularly at [employer] without any overt chest pain or shortness of breath with day-to-day activity.  More than usual activity does produce mild exertional dyspnea.  Denies any leg edema.  Also denies any lightheadedness, syncope.  Blood pressure is lower than usual  today.   Vitals:   06/26/24 1020 06/26/24 1032  BP: (!) 78/48 (!) 80/58  Pulse: 64   SpO2: 96%        ROS:  Review of Systems  Cardiovascular:  Negative for chest pain, dyspnea on exertion, leg swelling, palpitations and syncope.     Studies Reviewed: SABRA    EKG 07/18/2023: Atrial fibrillation Left ventricular hypertrophy with QRS widening and repolarization abnormality ( Sokolow-Lyon , Cornell product , Romhilt-Estes ) Prolonged QT When compared with ECG of 13-Jul-2021 21:16, No significant change noted  CPET 03/2024: Interpretation   Notes: Patient gave great effort. Pulse-oximetry decreased throughout exercise ranging from 98-91%.   ECG:  ECG file lost, no significant arrhythmias. HR response to exercise initially appropriate. At 9 minutes into testing, HR begins to decrease and remains lower than the highest HR achieved for the remainder of exercise.   PFT:  Pre-exercise spirometry suggests a restrictive pattern. MVV reflects the reduced FEV1.   CPX:  Exercise testing with gas exchange demonstrates a moderate to severe functional impairment with a peak VO2 of 15.3 ml/kg/min (42.4% of the age/gender/weight matched sedentary norms). The RER of 1.17 indicates a maximal effort. The VE/VCO2 slope is elevated. The oxygen uptake efficiency slope (OUES) is lower than predicted values (44% of predicted). The VO2 at the ventilatory threshold was below normal at 29.4% of the predicted peak VO2. At peak exercise, the ventilation reached 38.6% of the measured MVV indicating ventilatory reserve remained. The O2pulse (a surrogate for stroke volume) moderately increases in response to incremental exercise and peaks below threshold  at 7.43 ml/beat.   Preliminary conclusion: Exercise testing with gas exchange demonstrates a moderate to severe functional impairment. VO2 has decreased from last CPX test in 2021.   Preliminary CPX results, Finalized results will be forwarded when completed by  interpreting physician   Test, report and preliminary impression by:  Damien Nunnery, PhD, ACSM-EP  03/25/2024 2:38 PM   Attending: Spirometry suggests moderate restriction. Exercise testing with gas exchange reveals a severe HF limitation. Consideration should be given to advanced therapies, if clinically indicated.    RHC 10/2023: HEMODYNAMICS: RA:       6 mmHg (mean) RV:       47/2, 6 mmHg PA:       47/20 mmHg (33 mean) PCWP: 17 mmHg (mean) with v waves to 29                                      Estimated Fick CO/CI   4.54L/min, 2.76L/min/m2       Thermodilution CO/CI   4.47L/min, 2.72L/min/m2                                      TPG  16  mmHg                                               PVR  3.5 Wood Units  PAPi  4.5       IMPRESSION: Normal right and mildly elevated left sided filling pressures. V waves consistent with known MR. Preserved cardiac output by TD. 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   RECOMMENDATIONS: Serial CPXs to evaluate for worsening functional status Difficult R internal jugular access, avoid L given persistent SVC  Echocardiogram 07/2023:  1. Consider consultation with advanced heart failure or congenital  cardiology.   2. Dilated coronary sinus. Evidence on prior chest CT of persistent left  SVC.   3. Repaired atrial septum, no residual shunt seen.   4. Left ventricular non-compaction cardiomyopathy. Left ventricular  ejection fraction, by estimation, is 30%. The left ventricle has severely  decreased function. The left ventricle demonstrates global hypokinesis.  The left ventricular internal cavity  size was severely dilated. There is mild left ventricular hypertrophy.  Left ventricular diastolic function could not be evaluated.   5. Right ventricular systolic function is moderately reduced. The right  ventricular size is mildly enlarged. Severely increased  right ventricular  wall thickness. There is mildly elevated pulmonary artery systolic  pressure. The estimated right  ventricular systolic pressure is 40.7 mmHg.   6. Left atrial size was severely dilated.   7. Right atrial size was mild to moderately dilated.   8. 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 Torrential MR. The mitral valve is abnormal. Severe mitral  valve regurgitation. Mild mitral stenosis. The mean mitral valve gradient  is 5.3 mmHg with average heart  rate of 49 bpm.   9. Tricuspid valve regurgitation is mild to moderate.  10. The aortic valve was not well visualized. Aortic valve regurgitation  is severe. No aortic stenosis is present.  11. The inferior vena cava is normal in  size with greater than 50%  respiratory variability, suggesting right atrial pressure of 3 mmHg.   Labs 02/2024: Hb 15 Cr 2.34, eGFR 33, Na 129, T. Bili 1.8  Labs 09/2023: Hb 12.6 Cr 1.0, K 3.4 ProBNP 2149  Physical Exam:   Physical Exam Vitals and nursing note reviewed.  Constitutional:      General: He is not in acute distress. Neck:     Vascular: No JVD.  Cardiovascular:     Rate and Rhythm: Normal rate. Rhythm irregular.     Heart sounds: Murmur heard.     High-pitched blowing decrescendo early diastolic murmur is present with a grade of 2/4 at the upper right sternal border radiating to the apex.  Pulmonary:     Effort: Pulmonary effort is normal.     Breath sounds: Normal breath sounds. No wheezing or rales.  Musculoskeletal:     Right lower leg: No edema.     Left lower leg: No edema.    Risk Assessment/Calculations:    CHA2DS2-VASc Score = 1   This indicates a 0.6% annual risk of stroke. The patient's score is based upon: CHF History: 1 HTN History: 0 Diabetes History: 0 Stroke History: 0 Vascular Disease History: 0 Age Score: 0 Gender Score: 0       VISIT DIAGNOSES:   ICD-10-CM   1. HFrEF (heart failure with  reduced ejection fraction) (HCC)  I50.20     2. Noncompaction cardiomyopathy (HCC)  I42.8 ECHOCARDIOGRAM COMPLETE    3. AKI (acute kidney injury)  N17.9 Comprehensive metabolic panel with GFR    4. Chronic systolic heart failure (HCC)  P49.77 spironolactone  (ALDACTONE ) 25 MG tablet    FARXIGA  10 MG TABS tablet    furosemide  (LASIX ) 40 MG tablet    metoprolol  succinate (TOPROL -XL) 25 MG 24 hr tablet    sacubitril -valsartan  (ENTRESTO ) 24-26 MG    5. Chronic atrial fibrillation (HCC)  I48.20 apixaban  (ELIQUIS ) 5 MG TABS tablet         ASSESSMENT AND PLAN: .    EBER FERRUFINO is a 48 y.o. 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 CPET 03/2024 showing severe heart failure limitation. I would defer to Dr. Zenaida about next steps, if he needs further transplant workup.  He has been seen by [facility] advanced heart failure clinic on a telemedicine visit last in 03/2024. Blood pressure is lower than usual today, although he is not symptomatic. Recommend reducing spironolactone  from 25 mg daily to 12.5 mg daily. Continue rest of the GDMT including Entresto  24-26 mg twice daily, metoprolol  succinate 25 mg daily, spironolactone  25 mg daily, Farxiga  10 mg daily. Refilled all the meds.   Given his AKI in 03/2024, as well as elevated bilirubin, I will repeat a CMP today. I will also repeat echocardiogram, last checked in 07/2023.  Aortic and mitral regurgitation: Unlikely to be a candidate for MitraClip due to concomitant AR.  Continue management of heart failure as above.   Permanent atrial fibrillation: Rate controlled. CHA2DSVasc score 1 with annual  Stroke risk 0.6%. However, given his non-compaction cardiomyopathy, his stroke risk is higher.  Continue anticoagulation with apixaban  5 mg bid.     F/u in  6 months  Signed, Newman JINNY Lawrence, MD

## 2024-06-26 NOTE — Patient Instructions (Signed)
 Medication Instructions:  Reducing Spironolactone  to 12.5 mg dose daily. Refills provided for all medications.  *If you need a refill on your cardiac medications before your next appointment, please call your pharmacy*  Lab Work: CMP today. If you have labs (blood work) drawn today and your tests are completely normal, you will receive your results only by: MyChart Message (if you have MyChart) OR A paper copy in the mail If you have any lab test that is abnormal or we need to change your treatment, we will call you to review the results.  Testing/Procedures: Your physician has requested that you have an echocardiogram. Echocardiography is a painless test that uses sound waves to create images of your heart. It provides your doctor with information about the size and shape of your heart and how well your hearts chambers and valves are working. This procedure takes approximately one hour. There are no restrictions for this procedure. Please do NOT wear cologne, perfume, aftershave, or lotions (deodorant is allowed). Please arrive 15 minutes prior to your appointment time.  Please note: We ask at that you not bring children with you during ultrasound (echo/ vascular) testing. Due to room size and safety concerns, children are not allowed in the ultrasound rooms during exams. Our front office staff cannot provide observation of children in our lobby area while testing is being conducted. An adult accompanying a patient to their appointment will only be allowed in the ultrasound room at the discretion of the ultrasound technician under special circumstances. We apologize for any inconvenience.   Follow-Up: At [facility], you and your health needs are our priority.  As part of our continuing mission to provide you with exceptional heart care, our providers are all part of one team.  This team includes your primary Cardiologist (physician) and Advanced Practice Providers or APPs (Physician  Assistants and Nurse Practitioners) who all work together to provide you with the care you need, when you need it.  Your next appointment:   6 month(s)  Provider:   Dr. Elmira  We recommend signing up for the patient portal called MyChart.  Sign up information is provided on this After Visit Summary.  MyChart is used to connect with patients for Virtual Visits (Telemedicine).  Patients are able to view lab/test results, encounter notes, upcoming appointments, etc.  Non-urgent messages can be sent to your provider as well.   To learn more about what you can do with MyChart, go to [email or web address].

## 2024-06-27 ENCOUNTER — Ambulatory Visit: Payer: Self-pay | Admitting: Cardiology

## 2024-07-24 ENCOUNTER — Other Ambulatory Visit: Payer: Self-pay | Admitting: Cardiology

## 2024-07-24 ENCOUNTER — Other Ambulatory Visit (HOSPITAL_COMMUNITY): Payer: Self-pay | Admitting: Cardiology

## 2024-07-24 DIAGNOSIS — I5022 Chronic systolic (congestive) heart failure: Secondary | ICD-10-CM

## 2024-10-22 ENCOUNTER — Ambulatory Visit (HOSPITAL_COMMUNITY): Payer: MEDICAID
